# Patient Record
Sex: Female | Born: 1935
Health system: Southern US, Community
[De-identification: ages and names within clinical notes are randomized; demographics above are authoritative.]

## PROBLEM LIST (undated history)

## (undated) DIAGNOSIS — M810 Age-related osteoporosis without current pathological fracture: Secondary | ICD-10-CM

## (undated) DIAGNOSIS — C4490 Unspecified malignant neoplasm of skin, unspecified: Secondary | ICD-10-CM

## (undated) DIAGNOSIS — F419 Anxiety disorder, unspecified: Secondary | ICD-10-CM

## (undated) DIAGNOSIS — J189 Pneumonia, unspecified organism: Secondary | ICD-10-CM

## (undated) DIAGNOSIS — F17200 Nicotine dependence, unspecified, uncomplicated: Secondary | ICD-10-CM

## (undated) DIAGNOSIS — J449 Chronic obstructive pulmonary disease, unspecified: Secondary | ICD-10-CM

## (undated) DIAGNOSIS — H269 Unspecified cataract: Secondary | ICD-10-CM

## (undated) DIAGNOSIS — I639 Cerebral infarction, unspecified: Secondary | ICD-10-CM

## (undated) DIAGNOSIS — C50411 Malignant neoplasm of upper-outer quadrant of right female breast: Secondary | ICD-10-CM

## (undated) DIAGNOSIS — H3530 Unspecified macular degeneration: Secondary | ICD-10-CM

## (undated) DIAGNOSIS — H33329 Round hole, unspecified eye: Secondary | ICD-10-CM

## (undated) HISTORY — DX: Anxiety disorder, unspecified: F41.9

## (undated) HISTORY — PX: CATARACT EXTRACTION: SUR2

## (undated) HISTORY — DX: Age-related osteoporosis without current pathological fracture: M81.0

## (undated) HISTORY — DX: Round hole, unspecified eye: H33.329

## (undated) HISTORY — PX: MOHS SURGERY: SUR867

## (undated) HISTORY — PX: RETINAL LASER PROCEDURE: SHX2339

## (undated) HISTORY — DX: Cerebral infarction, unspecified: I63.9

## (undated) HISTORY — DX: Unspecified macular degeneration: H35.30

## (undated) HISTORY — DX: Chronic obstructive pulmonary disease, unspecified: J44.9

## (undated) HISTORY — DX: Unspecified cataract: H26.9

## (undated) HISTORY — PX: MASTECTOMY: SHX3

## (undated) HISTORY — DX: Nicotine dependence, unspecified, uncomplicated: F17.200

## (undated) HISTORY — DX: Malignant neoplasm of upper-outer quadrant of right female breast: C50.411

## (undated) HISTORY — DX: Unspecified malignant neoplasm of skin, unspecified: C44.90

## (undated) HISTORY — PX: BREAST BIOPSY: SHX20

## (undated) HISTORY — PX: APPENDECTOMY: SHX54

## (undated) HISTORY — PX: PARS PLANA VITRECTOMY W/ REPAIR OF MACULAR HOLE: SHX2170

---

## 1978-03-01 HISTORY — PX: ABDOMINAL HYSTERECTOMY: SHX81

## 1988-03-01 HISTORY — PX: BLADDER SURGERY: SHX569

## 1998-02-04 ENCOUNTER — Ambulatory Visit (HOSPITAL_COMMUNITY): Admission: RE | Admit: 1998-02-04 | Discharge: 1998-02-05 | Payer: Self-pay | Admitting: Ophthalmology

## 1998-02-04 ENCOUNTER — Encounter: Payer: Self-pay | Admitting: Ophthalmology

## 2011-02-15 ENCOUNTER — Ambulatory Visit (INDEPENDENT_AMBULATORY_CARE_PROVIDER_SITE_OTHER): Payer: Medicare Other

## 2011-02-15 DIAGNOSIS — Z23 Encounter for immunization: Secondary | ICD-10-CM

## 2011-02-15 DIAGNOSIS — Z130 Encounter for screening for diseases of the blood and blood-forming organs and certain disorders involving the immune mechanism: Secondary | ICD-10-CM

## 2013-03-01 DIAGNOSIS — C50919 Malignant neoplasm of unspecified site of unspecified female breast: Secondary | ICD-10-CM

## 2013-03-01 HISTORY — DX: Malignant neoplasm of unspecified site of unspecified female breast: C50.919

## 2013-03-01 HISTORY — PX: MASTECTOMY: SHX3

## 2013-07-13 ENCOUNTER — Encounter (INDEPENDENT_AMBULATORY_CARE_PROVIDER_SITE_OTHER): Payer: Medicare HMO | Admitting: Ophthalmology

## 2013-07-13 DIAGNOSIS — H3530 Unspecified macular degeneration: Secondary | ICD-10-CM

## 2013-07-13 DIAGNOSIS — H43819 Vitreous degeneration, unspecified eye: Secondary | ICD-10-CM

## 2013-07-13 DIAGNOSIS — H35369 Drusen (degenerative) of macula, unspecified eye: Secondary | ICD-10-CM

## 2013-10-04 ENCOUNTER — Telehealth: Payer: Self-pay | Admitting: *Deleted

## 2013-10-04 DIAGNOSIS — E538 Deficiency of other specified B group vitamins: Secondary | ICD-10-CM

## 2013-10-04 DIAGNOSIS — E559 Vitamin D deficiency, unspecified: Secondary | ICD-10-CM

## 2013-10-04 NOTE — Telephone Encounter (Signed)
Message copied by Julieta Bellini on Thu Oct 04, 2013  4:56 PM ------      Message from: WEAVER, LAYNE COX      Created: Thu Oct 04, 2013 11:43 AM       pls see msg below. Call son & gather info. about his concerns.  Thanks.      ----- Message -----         From: Nickola Major         Sent: 10/04/2013  11:13 AM           To: Irene Pap, NP            Patient's son would like to talk to you before her new patient appt tomorrow. His name is Daliana Leverett [phone]. He doesn't feel like she is going to tell you everything.       ------

## 2013-10-05 ENCOUNTER — Ambulatory Visit (INDEPENDENT_AMBULATORY_CARE_PROVIDER_SITE_OTHER)
Admission: RE | Admit: 2013-10-05 | Discharge: 2013-10-05 | Disposition: A | Discharge status: Home / Self Care | Payer: Medicare HMO | Source: Ambulatory Visit | Attending: Nurse Practitioner | Admitting: Nurse Practitioner

## 2013-10-05 ENCOUNTER — Ambulatory Visit: Payer: Self-pay | Admitting: Nurse Practitioner

## 2013-10-05 ENCOUNTER — Encounter: Payer: Self-pay | Admitting: Nurse Practitioner

## 2013-10-05 ENCOUNTER — Ambulatory Visit (INDEPENDENT_AMBULATORY_CARE_PROVIDER_SITE_OTHER): Payer: Medicare HMO | Admitting: Nurse Practitioner

## 2013-10-05 VITALS — BP 118/76 | HR 77 | Temp 98.0°F | Ht 64.5 in | Wt 109.8 lb

## 2013-10-05 DIAGNOSIS — R82998 Other abnormal findings in urine: Secondary | ICD-10-CM

## 2013-10-05 DIAGNOSIS — M7989 Other specified soft tissue disorders: Secondary | ICD-10-CM

## 2013-10-05 DIAGNOSIS — R42 Dizziness and giddiness: Secondary | ICD-10-CM

## 2013-10-05 DIAGNOSIS — F17200 Nicotine dependence, unspecified, uncomplicated: Secondary | ICD-10-CM | POA: Diagnosis not present

## 2013-10-05 DIAGNOSIS — F17210 Nicotine dependence, cigarettes, uncomplicated: Secondary | ICD-10-CM

## 2013-10-05 DIAGNOSIS — R634 Abnormal weight loss: Secondary | ICD-10-CM

## 2013-10-05 DIAGNOSIS — R8290 Unspecified abnormal findings in urine: Secondary | ICD-10-CM | POA: Insufficient documentation

## 2013-10-05 DIAGNOSIS — R6889 Other general symptoms and signs: Secondary | ICD-10-CM

## 2013-10-05 LAB — URINALYSIS, ROUTINE W REFLEX MICROSCOPIC
Bilirubin Urine: NEGATIVE
Hgb urine dipstick: NEGATIVE
Ketones, ur: NEGATIVE
Leukocytes, UA: NEGATIVE
Nitrite: NEGATIVE
Specific Gravity, Urine: 1.01 (ref 1.000–1.030)
Total Protein, Urine: NEGATIVE
Urine Glucose: NEGATIVE
Urobilinogen, UA: 0.2 (ref 0.0–1.0)
pH: 8.5 — AB (ref 5.0–8.0)

## 2013-10-05 LAB — CBC WITH DIFFERENTIAL/PLATELET
Basophils Absolute: 0.1 10*3/uL (ref 0.0–0.1)
Basophils Relative: 1.6 % (ref 0.0–3.0)
Eosinophils Absolute: 0.1 10*3/uL (ref 0.0–0.7)
Eosinophils Relative: 1.4 % (ref 0.0–5.0)
HEMATOCRIT: 44.5 % (ref 36.0–46.0)
HEMOGLOBIN: 14.7 g/dL (ref 12.0–15.0)
LYMPHS ABS: 1.7 10*3/uL (ref 0.7–4.0)
Lymphocytes Relative: 26.8 % (ref 12.0–46.0)
MCHC: 33.1 g/dL (ref 30.0–36.0)
MCV: 99 fl (ref 78.0–100.0)
MONO ABS: 0.6 10*3/uL (ref 0.1–1.0)
MONOS PCT: 8.8 % (ref 3.0–12.0)
NEUTROS ABS: 3.9 10*3/uL (ref 1.4–7.7)
Neutrophils Relative %: 61.4 % (ref 43.0–77.0)
PLATELETS: 206 10*3/uL (ref 150.0–400.0)
RBC: 4.49 Mil/uL (ref 3.87–5.11)
RDW: 13.2 % (ref 11.5–15.5)
WBC: 6.4 10*3/uL (ref 4.0–10.5)

## 2013-10-05 LAB — POCT URINALYSIS DIPSTICK
Bilirubin, UA: NEGATIVE
Blood, UA: NEGATIVE
GLUCOSE UA: NEGATIVE
Ketones, UA: NEGATIVE
Nitrite, UA: POSITIVE
SPEC GRAV UA: 1.015
Urobilinogen, UA: 1
pH, UA: 7.5

## 2013-10-05 LAB — COMPREHENSIVE METABOLIC PANEL
ALBUMIN: 3.4 g/dL — AB (ref 3.5–5.2)
ALT: 17 U/L (ref 0–35)
AST: 23 U/L (ref 0–37)
Alkaline Phosphatase: 66 U/L (ref 39–117)
BUN: 15 mg/dL (ref 6–23)
CHLORIDE: 100 meq/L (ref 96–112)
CO2: 27 meq/L (ref 19–32)
Calcium: 9.1 mg/dL (ref 8.4–10.5)
Creatinine, Ser: 0.9 mg/dL (ref 0.4–1.2)
GFR: 64.69 mL/min (ref >60.00)
GLUCOSE: 88 mg/dL (ref 70–99)
Potassium: 3.8 mEq/L (ref 3.5–5.1)
SODIUM: 138 meq/L (ref 135–145)
TOTAL PROTEIN: 6.1 g/dL (ref 6.0–8.3)
Total Bilirubin: 0.8 mg/dL (ref 0.2–1.2)

## 2013-10-05 LAB — T4, FREE: Free T4: 1.07 ng/dL (ref 0.60–1.60)

## 2013-10-05 LAB — VITAMIN D 25 HYDROXY (VIT D DEFICIENCY, FRACTURES): VITD: 18.54 ng/mL — AB (ref 30.00–100.00)

## 2013-10-05 LAB — TSH: TSH: 1.7 u[IU]/mL (ref 0.35–4.50)

## 2013-10-05 LAB — VITAMIN B12: Vitamin B-12: 215 pg/mL (ref 211–911)

## 2013-10-05 MED ORDER — CIPROFLOXACIN HCL 250 MG PO TABS
250.0000 mg | ORAL_TABLET | Freq: Two times a day (BID) | ORAL | Status: DC
Start: 2013-10-05 — End: 2013-11-07

## 2013-10-05 NOTE — Progress Notes (Signed)
Pre visit review using our clinic review tool, if applicable. No additional management support is needed unless otherwise documented below in the visit note. 

## 2013-10-05 NOTE — Telephone Encounter (Signed)
Patient's son Reynold Bowen called office requesting cb to discuss patient's ov for 10/05/2013. Returned [employer]. Reynold Bowen wanted Layne to be aware of some of the patient's symptoms. Reynold Bowen stated that patient has "slowed down" over the last few months. Patient has rapidly lost 50 lbs and has no energy. Patient has been making a lot of "little mistakes" with simple tasks. Patient has an appt Neuro 10/08/2013 for evaluation. Patient also has osteoporosis.

## 2013-10-05 NOTE — Patient Instructions (Addendum)
Please get chest xray. Please see cardiology regarding dizzy episodes and ankle swelling. Keep neurology appointment. Start drinking 1 can Ensure daily.  My office will call regarding lab work today.  I'd like to see you again in 4 to 6 weeks to review.  Nice to meet you!

## 2013-10-06 ENCOUNTER — Encounter: Payer: Self-pay | Admitting: Nurse Practitioner

## 2013-10-06 DIAGNOSIS — R6889 Other general symptoms and signs: Secondary | ICD-10-CM | POA: Insufficient documentation

## 2013-10-06 NOTE — Assessment & Plan Note (Signed)
+  1 pitting ankle edema, L>R w/swelling 1/3 way up shin on L. Dizzy episodes, not accompanied by nausea; palpitations; chest, neck, shoulder, or arm discomfort. Ref to cardio for eval. CXR, ua, CBC, CMET, thyroid studies

## 2013-10-06 NOTE — Progress Notes (Signed)
Subjective:     Gabrielle [name] is a 80 y.o. female and is here to establish care. The patient reports problems - forgetting how to get to familiar places & forgetting things she needs to do, weight loss 50 lbs. in 18 mos, no energy, dry skin, occasional diarrhea (1/mo.), occasional dizzyness w/position changes.b,. 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 In the last 6 mos she has begun to clean out & prepare her ex-husband's home for a sale due to his recent death. This has been somewhat overwhelming for her. She has lived alone for 21 years. Although she expresses concern over forgetfulness, she states she made appt. because her children asked her to do so. She thinks forgetfulness has been problem for a few mos.  She doesn't cook, eats out mostly, has 1 to 2 meals daily. Dizziness is not accompanied by other symptoms and has noticed this with weight loss.  She says she sleeps well and does not feel depressed.  History   Social History  . Marital Status: Divorced    Spouse Name: N/A    Number of Children: 2  . Years of Education: N/A   Occupational History  .      [employer]   Social History Main Topics  . Smoking status: Current Every Day Smoker -- 0.25 packs/day for 50 years  . Smokeless tobacco: Never Used  . Alcohol Use: 1.2 oz/week    2 Glasses of wine per week  . Drug Use: No  . Sexual Activity: No   Other Topics Concern  . Not on file   Social History Narrative   Gabrielle [name] lives alone. She continues to work PT for [employer]. She has a grown son-lives in [place] in [place].    Health Maintenance  Topic Date Due  . Tetanus/tdap  09/06/1951  . Colonoscopy  09/06/1982  . Zostavax  09/05/1992  . Influenza Vaccine  09/29/2013  . Pneumococcal Polysaccharide Vaccine Age 84 And Over  Completed    The following portions of the patient's history were reviewed and updated as appropriate: allergies, current medications, past family history, past medical  history, past social history, past surgical history and problem list.  Review of Systems Pertinent items are noted in HPI.   Objective:    BP 118/76  Pulse 77  Temp(Src) 98 F (36.7 C) (Temporal)  Ht 5' 4.5" (1.638 m)  Wt 109 lb 12 oz (49.782 kg)  BMI 18.55 kg/m2  SpO2 96% General appearance: alert, cooperative, appears stated age, no distress and responses negotiably delayed, answers open-ended questions, but is not conversive. Affect flat. Head: Normocephalic, without obvious abnormality, atraumatic Eyes: negative findings: lids and lashes normal and conjunctivae and sclerae normal Ears: effusion bilat TM, TM retraction, bones visible Throat: lips, mucosa, and tongue normal; teeth and gums normal and upper dentures in place Lungs: clear to auscultation bilaterally Heart: regular rate and rhythm, S1, S2 normal, no murmur, click, rub or gallop Abdomen: soft, non-tender; bowel sounds normal; no masses,  no organomegaly Extremities: edema +1 pitting ankle edema Pulses: 2+ and symmetric Lymph nodes: No cervical or [place] LAD    Assessment:   1. Loss of weight - CBC with Differential - Comprehensive metabolic panel - Urinalysis, Routine w reflex microscopic - Vit D  25 hydroxy (rtn osteoporosis monitoring) - Vitamin B12 - TSH - T4, free - Thyroid peroxidase antibody - DG Chest 2 View; Future  2. Dizzy - Ambulatory referral to Cardiology  3. Swelling of both lower extremities - Ambulatory referral to Cardiology - DG Chest 2 View; Future  4. Cigarette smoker - DG Chest 2 View; Future  5. Malodorous urine - Urine culture - ciprofloxacin (CIPRO) 250 MG tablet; Take 1 tablet (250 mg total) by mouth 2 (two) times daily.  Dispense: 6 tablet; Refill: 0 - POCT urinalysis dipstick  6. Forgetfulness May be r/t UTI

## 2013-10-06 NOTE — Assessment & Plan Note (Signed)
Still working for [employer]. Weight loss. Responses somewhat delayed, answers all questions, but not conversive. Children concerned, she is concerned. Mini mental assessment score 29/30. Has appt w/neurology for eval.

## 2013-10-06 NOTE — Assessment & Plan Note (Signed)
DD: thyroid disease, anemia, arrythmia, vertigo, CVD, orthostatic BP changes CMET, TSH, CBC, B12 Ref to cardiology for eval, given ankle edema, smoker, 80 yo.

## 2013-10-06 NOTE — Assessment & Plan Note (Signed)
UA-pos nites, leuks, trace protein, SG 1.015 Forgetting how to get to familiar places may be symptom, o/w asymptomatic. Culture, urine Cipro F/u 2-3 weeks.

## 2013-10-06 NOTE — Assessment & Plan Note (Signed)
DD: thyroid disease, cancer (smoker), dementia CBC, thryoid studies, CXRY, CMET Pt has appt. W/neurology this week to eval "forgetting how to drive to familiar places"

## 2013-10-07 LAB — URINE CULTURE: Colony Count: >=100000

## 2013-10-08 ENCOUNTER — Other Ambulatory Visit: Payer: Self-pay | Admitting: *Deleted

## 2013-10-08 ENCOUNTER — Ambulatory Visit (INDEPENDENT_AMBULATORY_CARE_PROVIDER_SITE_OTHER): Payer: Medicare HMO | Admitting: Neurology

## 2013-10-08 ENCOUNTER — Encounter: Payer: Self-pay | Admitting: Neurology

## 2013-10-08 VITALS — BP 122/64 | HR 60 | Resp 18 | Ht 64.0 in | Wt 110.0 lb

## 2013-10-08 DIAGNOSIS — E538 Deficiency of other specified B group vitamins: Secondary | ICD-10-CM

## 2013-10-08 DIAGNOSIS — E559 Vitamin D deficiency, unspecified: Secondary | ICD-10-CM

## 2013-10-08 DIAGNOSIS — R413 Other amnesia: Secondary | ICD-10-CM

## 2013-10-08 LAB — THYROID PEROXIDASE ANTIBODY: Thyroperoxidase Ab SerPl-aCnc: <10 IU/mL (ref <35.0)

## 2013-10-08 MED ORDER — CYANOCOBALAMIN 1000 MCG/ML IJ SOLN: 1000.0000 ug | INTRAMUSCULAR | Status: DC

## 2013-10-08 MED ORDER — CHOLECALCIFEROL 125 MCG (5000 UT) PO CAPS: 5000.0000 [IU] | ORAL_CAPSULE | Freq: Every day | ORAL | Status: DC

## 2013-10-08 NOTE — Telephone Encounter (Signed)
pls call pt: Advise b12 low. I recmd weekly B12 injections for 6 weeks. Also, she may start B complex supplement with at least 1200 mcg of B12. Vit D too low. Start D3 5000 iu daily. Urine culture shows bacterial growth. Must start on antibiotics today. Set up appt. For B12 inj for this week.

## 2013-10-08 NOTE — Telephone Encounter (Signed)
Patient notified of results. Patient expressed understanding. Patient scheduled nurse appt for 10/12/13 at 3:00.

## 2013-10-08 NOTE — Patient Instructions (Signed)
1.  If you do not hear anything from your primary care doctors office in the next week regarding B12 injections, please contact their office directly, or contact our office. 2.  Start taking vitamin D 800 units daily 3.  Please start taking the prescribed antibiotics for your urinary tract infection 4.  If not improvement, will plan on getting Neuropsychiatric testing 5.  Return to clinic in 6 weeks

## 2013-10-08 NOTE — Progress Notes (Signed)
[facility] Clinic Note - Initial Visit   Date: 10/08/2013  Gabrielle [name] MRN: [id number] DOB: 08-19-1930   Dear Nicky Pugh, NP:  Thank you for your kind referral of Gabrielle [name] for consultation of memory changes. Although her history is well known to you, please allow [place] to reiterate it for the purpose of our medical record. The patient was accompanied to the clinic by son who also provides collateral information.     History of Present Illness: Gabrielle [name] is a 80 y.o. right-handed Caucasian female with history of current tobacco use  presenting for evaluation of memory changes.    Since January 2015, her son noticed changes in her mother's cognitive ability.  She has been having problems with short-term memory and not has "sharp" as she used to be.  She is highly functioning at baseline and works part-time at [employer] and has not noticed any problems at work.  Problems are mostly with short-term memory such as remembering names, dates, appointments and she has started to write things down more.  Her son and daughter have noticed the changes more than patient.  Hobbies include reading, working, and lately she has been organizing her ex-husbands home since he passed away last year.  They had been divorced for 20+ years.  She is driving without any difficulty.  She denies getting lost or being involved in any car accidents.  She cooks for herself and eats 2-3 meals per day.  Her son says that her response is slower, such as when talking in conversation.  Her mood is good.  Sleep is fair, averaging 6-7 hours per night.  No bizarre or inappropriate behavior changes.    She has not seen a physician in 10-15 years and saw primary care physician on Friday.  She was found to have UTI and started on ciprofloxacin (not started taking it yet).    Of note, she has lost 50lb unintentionally in the past 20-months.  Appetite remains good.     Dementia  questions Memory Are you repeating things excessively?  Yes Are you forgetting important details of conversations/events?  Yes Are you prone to misplacing items more now than in the past?  Yes Can you tell me about some recent headlines?  "presidential debate"  Executive Are you having trouble managing financial matters?  No problems with finance Are you taking your medications regularly w/o prompting?  N/A Can you organize and prepare a large holiday meal for multiple people?  Yes Can you multitask effectively? Yes  Language Do you have any word finding difficulties?  No  Do you have trouble following instructions or a conversation?  Sometimes Have you been avoiding reading or writing due to problems with recognition or remembering words?  "I read a lot"  Are you using generalities when speaking because of memory trouble?  No  Visuospatial Are you getting lost while driving?  No Are you getting turned around in your own home?  No Do you have trouble recognizing familiar faces/family members/close friends?  No Do you have trouble dressing, putting on cloths?  No    Past Medical History  Diagnosis Date  . Cataract   . Retina hole     Past Surgical History  Procedure Laterality Date  . Abdominal hysterectomy  1980  . Cataract extraction Bilateral   . Retinal laser procedure       Medications:  Current Outpatient Prescriptions on File Prior to Visit  Medication Sig Dispense Refill  .  ciprofloxacin (CIPRO) 250 MG tablet Take 1 tablet (250 mg total) by mouth 2 (two) times daily.  6 tablet  0   No current facility-administered medications on file prior to visit.    Allergies: No Known Allergies  Family History: Family History  Problem Relation Age of Onset  . Heart attack Mother     Deceased  . Throat cancer Father     Deceased  . Cancer Brother     Deceased  . Other Brother     Deceased, hemorhhage of pancreas    Social History: History   Social History   . Marital Status: Divorced    Spouse Name: N/A    Number of Children: 2  . Years of Education: N/A   Occupational History  .      [employer]   Social History Main Topics  . Smoking status: Current Every Day Smoker -- 0.50 packs/day for 50 years    Types: Cigarettes  . Smokeless tobacco: Never Used  . Alcohol Use: 1.2 oz/week    2 Glasses of wine per week     Comment: wine a couple times weekly  . Drug Use: No  . Sexual Activity: No   Other Topics Concern  . Not on file   Social History Narrative   Ms. Mindel lives alone in a [place] home. She had two grown children.   She continues to work part-time for [employer].    She has a grown son-lives in [place] in [place]. [name] is her nephew.    Review of Systems:  CONSTITUTIONAL: No fevers, chills, night sweats, + 50lb unintentional weight loss.   EYES: No visual changes or eye pain ENT: No hearing changes.  No history of nose bleeds.   RESPIRATORY: No cough, wheezing and shortness of breath.   CARDIOVASCULAR: Negative for chest pain, and palpitations.   GI: Negative for abdominal discomfort, blood in stools or black stools.  No recent change in bowel habits.   GU:  No history of incontinence.   MUSCLOSKELETAL: No history of joint pain or swelling.  No myalgias.   SKIN: Negative for lesions, rash, and itching.   HEMATOLOGY/ONCOLOGY: Negative for prolonged bleeding, bruising easily, and swollen nodes.  No history of cancer.   ENDOCRINE: Negative for cold or heat intolerance, polydipsia or goiter.   PSYCH:  No depression or anxiety symptoms.   NEURO: As Above.   Vital Signs:  BP 122/64  Pulse 60  Resp 18  Ht 5\' 4"  (1.626 m)  Wt 110 lb (49.896 kg)  BMI 18.87 kg/m2 Pain Scale: 0 on a scale of 0-10   General Medical Exam:   General:  Flat affect, reduced blink, comfortable, thin-appearing.   Eyes/ENT: see cranial nerve examination.   Neck: No carotid bruits. Respiratory:  Clear to auscultation,  good air entry bilaterally.   Cardiac:  Regular rate and rhythm, no murmur.    Extremities:  Arthritic deformities of the toes bilaterally, mild edema of the left ankle.   Neurological Exam: MENTAL STATUS including orientation to time, place, person, recent and remote memory, attention span and concentration, language, and fund of knowledge is fair.  Speech is not dysarthric. Thought content and processing is normal.   Montreal Cognitive Assessment  10/08/2013  Visuospatial/ Executive (0/5) 2  Naming (0/3) 2  Attention: Read list of digits (0/2) 2  Attention: Read list of letters (0/1) 1  Attention: Serial 7 subtraction starting at 100 (0/3) 1  Language: Repeat  phrase (0/2) 2  Language : Fluency (0/1) 0  Abstraction (0/2) 2  Delayed Recall (0/5) 3  Orientation (0/6) 5  Total 20  Adjusted Score (based on education) 20   Serial presidents:  Obama, Bush, Clinton 9/11:  [place], 4 planes Chest pain:  Call 9-1-1 Luria test: Impaired bilaterally  CRANIAL NERVES: II:  No visual field defects.  Unremarkable fundi.   III-IV-VI: Pupils equal round and reactive to light.  Upward gaze mild to restricted bilaterally, otherwise normal conjugate, extra-ocular eye movements in all directions of gaze.  No nystagmus.    V:  Normal facial sensation.   VII:  Normal facial symmetry and movements.  Left palmomental reflex, mild snout.  VIII:  Normal hearing and vestibular function.   IX-X:  Normal palatal movement.   XI:  Normal shoulder shrug and head rotation.   XII:  Normal tongue strength and range of motion, no deviation or fasciculation.  MOTOR:  Generalized 4+/5 motor strength throughout.  No atrophy, fasciculations or abnormal movements.  No pronator drift.  Tone is normal.    MSRs:  Right                                                                 Left brachioradialis 2+  brachioradialis 2+  biceps 2+  biceps 2+  triceps 2+  triceps 2+  patellar 2+  patellar 2+  ankle  jerk 0  ankle jerk 0  Hoffman no  Hoffman no  plantar response down  plantar response down   SENSORY:  Normal and symmetric perception of light touch, pinprick, vibration, and proprioception.  Romberg's sign absent.   COORDINATION/GAIT: Normal finger-to- nose-finger and heel-to-shin.  Intact rapid alternating movements bilaterally. Gait is slightly slow and cautious, stooped posture, appears stable.  Tandem gait intact.   Data: Lab Results  Component Value Date   TSH 1.70 10/05/2013   Lab Results  Component Value Date   VITAMINB12 215 10/05/2013     IMPRESSION: Ms. Kronick is a 80 year old female presenting for evaluation of short term memory problems since early 2015. She seems to have always been very highly functional and in good health, so has not been under the care of a medical provider for the past 10-15 years. Do to concerns by her daughter and her son regarding her memory, she was urged to be evaluated. Her neurological examination shows MOCA of 20/30 with moderate cognitive deficits affecting all of the tested domains.  Her clinical exam shows mild pathological facial reflexes and with slight generalized weakness throughout. I explained that there is evidence of cognitive impairment and it is difficult to determine underlying etiology, since there are several factors that may be contributing.  She was recently found to have urinary tract infection and has not started antibiotics, her labs also indicated vitamin B12 deficiency, and vitamin D deficiency. Although there may be an underlying dementia, it is important to manage potentially treatable causes of mental status changes including nutrient deficiencies and infection.  At this time, I have urged them to start taking antibiotics for urinary tract infection and I also recommend aggressive vitamin B12 supplementation. I'm not sure why she has lost 50 pounds in the past year, but encouraged her to follow up with her primary care  provider for  workup.  She does does not seem to have any problems with driving at this time, which her son agrees with. I have discussed that going forward, if there are any safety concerns, we would need to discuss driving restrictions in the future.    PLAN/RECOMMENDATIONS:  1. Recommend starting vitamin B12 1051mcg IM injection daily x 7 days, weekly x 4 weeks, then monthly thereafter x 1 year.  Will request that her PCP's office administer, since it is closer for her. 2.  Start taking vitamin D 800 units daily 3.  Please start taking the prescribed antibiotics for your urinary tract infection 4.  If not improvement, will plan on getting Neuropsychiatric testing 5.  Return to clinic in 6 weeks   The duration of this appointment visit was 60 minutes of face-to-face time with the patient.  Greater than 50% of this time was spent in counseling, explanation of diagnosis, planning of further management, and coordination of care.   Thank you for allowing me to participate in patient's care.  If I can answer any additional questions, I would be pleased to do so.    Sincerely,    Drayson Dorko K. Posey Pronto, DO

## 2013-10-12 ENCOUNTER — Ambulatory Visit (INDEPENDENT_AMBULATORY_CARE_PROVIDER_SITE_OTHER): Payer: Medicare HMO | Admitting: *Deleted

## 2013-10-12 DIAGNOSIS — E538 Deficiency of other specified B group vitamins: Secondary | ICD-10-CM

## 2013-10-12 MED ORDER — CYANOCOBALAMIN 1000 MCG/ML IJ SOLN
1000.0000 ug | Freq: Once | INTRAMUSCULAR | Status: AC
  Administered 2013-10-12: 1000 ug via INTRAMUSCULAR

## 2013-10-18 ENCOUNTER — Ambulatory Visit: Payer: Medicare HMO

## 2013-10-18 DIAGNOSIS — E538 Deficiency of other specified B group vitamins: Secondary | ICD-10-CM

## 2013-10-18 MED ORDER — CYANOCOBALAMIN 1000 MCG/ML IJ SOLN
1000.0000 ug | Freq: Once | INTRAMUSCULAR | Status: AC
  Administered 2013-10-18: 1000 ug via INTRAMUSCULAR

## 2013-10-25 ENCOUNTER — Ambulatory Visit (INDEPENDENT_AMBULATORY_CARE_PROVIDER_SITE_OTHER): Payer: Medicare HMO | Admitting: Family Medicine

## 2013-10-25 DIAGNOSIS — E538 Deficiency of other specified B group vitamins: Secondary | ICD-10-CM

## 2013-10-25 MED ORDER — CYANOCOBALAMIN 1000 MCG/ML IJ SOLN
1000.0000 ug | Freq: Once | INTRAMUSCULAR | Status: AC
  Administered 2013-10-25: 1000 ug via INTRAMUSCULAR

## 2013-10-30 DIAGNOSIS — C50411 Malignant neoplasm of upper-outer quadrant of right female breast: Secondary | ICD-10-CM

## 2013-10-30 HISTORY — DX: Malignant neoplasm of upper-outer quadrant of right female breast: C50.411

## 2013-11-01 ENCOUNTER — Ambulatory Visit (INDEPENDENT_AMBULATORY_CARE_PROVIDER_SITE_OTHER): Payer: Commercial Managed Care - HMO | Admitting: Family Medicine

## 2013-11-01 DIAGNOSIS — E538 Deficiency of other specified B group vitamins: Secondary | ICD-10-CM

## 2013-11-01 MED ORDER — CYANOCOBALAMIN 1000 MCG/ML IJ SOLN
1000.0000 ug | Freq: Once | INTRAMUSCULAR | Status: AC
  Administered 2013-11-01: 1000 ug via INTRAMUSCULAR

## 2013-11-06 ENCOUNTER — Encounter: Payer: Self-pay | Admitting: Nurse Practitioner

## 2013-11-06 ENCOUNTER — Ambulatory Visit (INDEPENDENT_AMBULATORY_CARE_PROVIDER_SITE_OTHER): Payer: Commercial Managed Care - HMO | Admitting: Nurse Practitioner

## 2013-11-06 VITALS — BP 116/72 | HR 75 | Temp 98.0°F | Resp 18 | Ht 64.0 in | Wt 112.0 lb

## 2013-11-06 DIAGNOSIS — R42 Dizziness and giddiness: Secondary | ICD-10-CM

## 2013-11-06 DIAGNOSIS — M7989 Other specified soft tissue disorders: Secondary | ICD-10-CM

## 2013-11-06 DIAGNOSIS — Z87440 Personal history of urinary (tract) infections: Secondary | ICD-10-CM

## 2013-11-06 DIAGNOSIS — R634 Abnormal weight loss: Secondary | ICD-10-CM

## 2013-11-06 DIAGNOSIS — E538 Deficiency of other specified B group vitamins: Secondary | ICD-10-CM

## 2013-11-06 DIAGNOSIS — J439 Emphysema, unspecified: Secondary | ICD-10-CM

## 2013-11-06 DIAGNOSIS — E559 Vitamin D deficiency, unspecified: Secondary | ICD-10-CM

## 2013-11-06 DIAGNOSIS — R6889 Other general symptoms and signs: Secondary | ICD-10-CM

## 2013-11-06 DIAGNOSIS — J438 Other emphysema: Secondary | ICD-10-CM

## 2013-11-06 MED ORDER — CYANOCOBALAMIN 1000 MCG/ML IJ SOLN
1000.0000 ug | Freq: Once | INTRAMUSCULAR | Status: AC
  Administered 2013-11-06: 1000 ug via INTRAMUSCULAR

## 2013-11-06 NOTE — Progress Notes (Signed)
Pre visit review using our clinic review tool, if applicable. No additional management support is needed unless otherwise documented below in the visit note. 

## 2013-11-06 NOTE — Patient Instructions (Signed)
Continue with Vitamin d3 5000 iu daily with meal. Continue with ensure daily-1 to 2 cans. Eat fresh fruit everyday to nourish your body.  OK to take 600 mg calcium daily. It is best absorbed at night, so take it at or near bedtime.  Please get CT scans.  Please keep appointment with cardiology tomorrow.  Call Dr Serita Grit office regarding follow up-looks like she wanted to see you in 6 weeks.  Return for B12 next week & to discuss CT results.  Nice to see you!

## 2013-11-07 ENCOUNTER — Ambulatory Visit (INDEPENDENT_AMBULATORY_CARE_PROVIDER_SITE_OTHER): Payer: Commercial Managed Care - HMO | Admitting: Cardiovascular Disease

## 2013-11-07 ENCOUNTER — Encounter: Payer: Self-pay | Admitting: Cardiovascular Disease

## 2013-11-07 VITALS — BP 110/60 | HR 84 | Ht 64.0 in | Wt 114.2 lb

## 2013-11-07 DIAGNOSIS — J439 Emphysema, unspecified: Secondary | ICD-10-CM | POA: Insufficient documentation

## 2013-11-07 DIAGNOSIS — J449 Chronic obstructive pulmonary disease, unspecified: Secondary | ICD-10-CM

## 2013-11-07 DIAGNOSIS — R609 Edema, unspecified: Secondary | ICD-10-CM

## 2013-11-07 DIAGNOSIS — R0609 Other forms of dyspnea: Secondary | ICD-10-CM

## 2013-11-07 DIAGNOSIS — R600 Localized edema: Secondary | ICD-10-CM

## 2013-11-07 DIAGNOSIS — R0989 Other specified symptoms and signs involving the circulatory and respiratory systems: Secondary | ICD-10-CM

## 2013-11-07 DIAGNOSIS — R0600 Dyspnea, unspecified: Secondary | ICD-10-CM

## 2013-11-07 DIAGNOSIS — M7989 Other specified soft tissue disorders: Secondary | ICD-10-CM

## 2013-11-07 NOTE — Assessment & Plan Note (Signed)
No improvement in fatigue after 4 weekly inj. 2 more weekly inj, then I will check level.

## 2013-11-07 NOTE — Assessment & Plan Note (Signed)
Gabrielle [name] presents today for further evaluation and management of her leg edema as well as some dizziness. The leg edema is very mild and appears to be more related to her COPD. She denies any PND orthopnea. Chest x-ray does not show any pulmonary edema which does not suggest any LV dysfunction. She does have significant COPD I suspect that she has right-sided heart failure based on her potassium. I think it is important for her to  start  treatment for her COPD.  She also has some dizziness but this is actually very benign/  We will get an echo and see her as needed.    I think that her 45-50 lb weight loss is actually more critical than this trace leg edema.   She has orders for CT of the lung and abdomen in Epic.

## 2013-11-07 NOTE — Progress Notes (Signed)
Subjective:     Gabrielle [name] is a 80 y.o. female who presents for f/u weight loss, fatigue, UTI, B12 & vit D deficiency. She is accompanied by her daughter today who voices reasonable concern over weight loss & wonders if her mother has cancer. She requests CT scans of chest, abdomen, & pelvis. Pt is in agreement.  Gabrielle [name] states she has had no improvement in energy in spite of B12 injections & drinking ensure daily, although she had 3 lb. Wt gain in 4 weeks since starting Ensure. She answers all questions appropriately, although it takes a few brief seconds to get thoughts together. She isn't sure if she has clearer cognition since UTI was treated.  Gabrielle [name] continues to smoke about 6 cigarettes daily. She is not interested in quitting. Her daughter is concerned about her cough-recent chest XRY showed hyperinflation consistent with emphysema-this was discussed with pt & daughter. Pt will consider seeing pulmonologist, but wants to get CT scan results first.   The following portions of the patient's history were reviewed and updated as appropriate: allergies, current medications, past family history, past medical history, past social history, past surgical history and problem list.  Review of Systems Pertinent items are noted in HPI.    Objective:    BP 116/72  Pulse 75  Temp(Src) 98 F (36.7 C) (Oral)  Resp 18  Ht 5\' 4"  (1.626 m)  Wt 112 lb (50.803 kg)  BMI 19.22 kg/m2  SpO2 98% BP 116/72  Pulse 75  Temp(Src) 98 F (36.7 C) (Oral)  Resp 18  Ht 5\' 4"  (1.626 m)  Wt 112 lb (50.803 kg)  BMI 19.22 kg/m2  SpO2 98% General appearance: alert, cooperative, appears stated age and no distress Head: Normocephalic, without obvious abnormality, atraumatic Eyes: negative findings: lids and lashes normal and conjunctivae and sclerae normal Lungs: diminished breath sounds bibasilar and bilaterally Heart: regular rate and rhythm, S1, S2 normal, no murmur, click, rub or gallop Extremities: +1  pitting edema from feet to 3 inches above ankles bilat Pulses: 2+ and symmetric    Assessment:   1. Loss of weight - CT Abdomen Pelvis Wo Contrast; Future - CT Chest Wo Contrast; Future  2. Recent urinary tract infection - Urine culture  3. Dizzy  4. Forgetfulness  5. Swelling of both lower extremities  6. Unspecified vitamin D deficiency  7. Vitamin B12 deficiency without anemia - cyanocobalamin ((VITAMIN B-12)) injection 1,000 mcg; Inject 1 mL (1,000 mcg total) into the muscle once.  8. Pulmonary emphysema  See problem list for complete A&P See pt instructions. F/u 2 weeks.

## 2013-11-07 NOTE — Assessment & Plan Note (Signed)
3 lb. Weight gain in 4 weeks-started drinking Ensure daily. Thyroid nml No anemia Family requests CT scans of abdomen, pelvis, chest. Pt agrees. Ordered.  F/u 2 weeks to discuss results.

## 2013-11-07 NOTE — Assessment & Plan Note (Signed)
Mild improvement after drinking ensure for daily for 4 weeks (albumin was low), but persistent. Feels tired in spite of UTI treatemnt & B12 injections. Likely cardiac etiology. Discussed w/ pt & dtr. She has cardio. appt this week.

## 2013-11-07 NOTE — Patient Instructions (Addendum)
You have been referred to [facility] has requested that you have an echocardiogram. Echocardiography is a painless test that uses sound waves to create images of your heart. It provides your doctor with information about the size and shape of your heart and how well your heart's chambers and valves are working. This procedure takes approximately one hour. There are no restrictions for this procedure.  Your physician recommends that you schedule a follow-up appointment in: as needed with Dr. Acie Fredrickson

## 2013-11-07 NOTE — Assessment & Plan Note (Signed)
Hyperinflation per CXY 09/2013 Smoker: 1/4 ppd X 50 yrs. chronic cough Not interested in quitting Wt loss, but does not have appearance of "pink puffer" CT scan chest ordered.

## 2013-11-07 NOTE — Assessment & Plan Note (Signed)
Continue 5000 iu daily Will recheck level in 2 mos. No improvement in fatigue after 4 weeks, but daughter states she was taking 1000 rather than 5000 daily.

## 2013-11-07 NOTE — Progress Notes (Signed)
Gabrielle [name] Date of Birth  01-05-1931       [place] [address], [place], [place]  [address]   [place], [place]  [address] [place]   Fax  [phone]     Fax [phone]  Problem List: 1. Dizziness 2. COPD 3. Leg edema 4. Weight loss  History of Present Illness:  Gabrielle [name] is seen today with her daughter, Gabrielle [name].   She has had lost lots of weight. Between 45-50 pounds over the past several years.  She's had lots of generalized fatigue.   her appetite has been okay according to the patient that her daughter thinks that she is not eating well.   She is a long time smoker. She was seen by a primary MD / NP .Marland Kitchen  She's had chronic leg edema for the several months.  She still eats a fair amount of salty cheese bacon, bloating, sausage, ham.    CXR recently shows COPD.   Current Outpatient Prescriptions on File Prior to Visit  Medication Sig Dispense Refill  . Cholecalciferol ([employer] D3) 5000 UNITS capsule Take 1 capsule (5,000 Units total) by mouth daily.  30 capsule  2  . cyanocobalamin (,VITAMIN B-12,) 1000 MCG/ML injection Inject 1 mL (1,000 mcg total) into the muscle every 7 (seven) days. 4 injections q7 X 6wks.  1 mL  0   No current facility-administered medications on file prior to visit.    No Known Allergies  Past Medical History  Diagnosis Date  . Cataract   . Retina hole     Past Surgical History  Procedure Laterality Date  . Abdominal hysterectomy  1980  . Cataract extraction Bilateral   . Retinal laser procedure      History  Smoking status  . Current Every Day Smoker -- 0.50 packs/day for 50 years  . Types: Cigarettes  Smokeless tobacco  . Never Used    History  Alcohol Use  . 1.2 oz/week  . 2 Glasses of wine per week    Comment: wine a couple times weekly    Family History  Problem Relation Age of Onset  . Heart attack Mother     Deceased  .  Throat cancer Father     Deceased  . Cancer Brother     Deceased  . Other Brother     Deceased, hemorhhage of pancreas    Reviw of Systems:  Reviewed in the HPI.  All other systems are negative.  Physical Exam: Blood pressure 110/60, pulse 84, height 5\' 4"  (1.626 m), weight 114 lb 3.2 oz (51.801 kg). Wt Readings from Last 3 Encounters:  11/07/13 114 lb 3.2 oz (51.801 kg)  11/06/13 112 lb (50.803 kg)  10/08/13 110 lb (49.896 kg)     General: Chronically ill-appearing, elderly female  Head: Normocephalic, atraumatic, sclera non-icteric, mucus membranes are moist,   Neck: Supple. Carotids are 2 + without bruits. No JVD   Lungs: decresed breath sounds  Heart: RR  Abdomen: Soft, non-tender, non-distended with normal bowel sounds.  Msk:  Strength and tone are normal   Extremities: Chronic stasis changes. She has trace edema.    Distal pedal pulses are 2+ and equal    Neuro: CN II - XII intact.  Alert and oriented X 3.   Psych:  Normal   ECG: 11/07/2013: Sinus rhythm with occasional PACs. Her heart rate is  84.  Assessment / Plan:

## 2013-11-08 LAB — URINE CULTURE: Colony Count: 15000

## 2013-11-14 ENCOUNTER — Inpatient Hospital Stay: Admission: RE | Admit: 2013-11-14 | Payer: Commercial Managed Care - HMO | Source: Ambulatory Visit

## 2013-11-15 ENCOUNTER — Ambulatory Visit (INDEPENDENT_AMBULATORY_CARE_PROVIDER_SITE_OTHER): Payer: Commercial Managed Care - HMO | Admitting: Family Medicine

## 2013-11-15 ENCOUNTER — Other Ambulatory Visit: Payer: Self-pay | Admitting: Nurse Practitioner

## 2013-11-15 DIAGNOSIS — E538 Deficiency of other specified B group vitamins: Secondary | ICD-10-CM

## 2013-11-15 DIAGNOSIS — R634 Abnormal weight loss: Secondary | ICD-10-CM

## 2013-11-15 MED ORDER — CYANOCOBALAMIN 1000 MCG/ML IJ SOLN
1000.0000 ug | Freq: Once | INTRAMUSCULAR | Status: AC
  Administered 2013-11-15: 1000 ug via INTRAMUSCULAR

## 2013-11-19 ENCOUNTER — Ambulatory Visit
Admission: RE | Admit: 2013-11-19 | Discharge: 2013-11-19 | Disposition: A | Discharge status: Home / Self Care | Payer: Commercial Managed Care - HMO | Source: Ambulatory Visit | Attending: Nurse Practitioner | Admitting: Nurse Practitioner

## 2013-11-19 ENCOUNTER — Ambulatory Visit
Admission: RE | Admit: 2013-11-19 | Discharge: 2013-11-19 | Disposition: A | Discharge status: Home / Self Care | Payer: Medicare HMO | Source: Ambulatory Visit | Attending: Nurse Practitioner | Admitting: Nurse Practitioner

## 2013-11-19 DIAGNOSIS — R634 Abnormal weight loss: Secondary | ICD-10-CM

## 2013-11-19 MED ORDER — IOHEXOL 300 MG/ML  SOLN
100.0000 mL | Freq: Once | INTRAMUSCULAR | Status: AC | PRN
Start: 2013-11-19 — End: 2013-11-19
  Administered 2013-11-19: 100 mL via INTRAVENOUS

## 2013-11-20 ENCOUNTER — Telehealth: Payer: Self-pay | Admitting: Nurse Practitioner

## 2013-11-20 NOTE — Telephone Encounter (Signed)
R breast mass, R lung nodule. Stool impaction. Spoke w/pt-asked her to come in to discuss CT results.  Pt has appt tomorrow.

## 2013-11-21 ENCOUNTER — Encounter: Payer: Self-pay | Admitting: Nurse Practitioner

## 2013-11-21 ENCOUNTER — Ambulatory Visit (INDEPENDENT_AMBULATORY_CARE_PROVIDER_SITE_OTHER): Payer: Commercial Managed Care - HMO | Admitting: Nurse Practitioner

## 2013-11-21 ENCOUNTER — Other Ambulatory Visit: Payer: Self-pay | Admitting: Nurse Practitioner

## 2013-11-21 VITALS — BP 114/67 | HR 82 | Temp 98.2°F | Resp 18 | Ht 64.0 in | Wt 112.0 lb

## 2013-11-21 DIAGNOSIS — R911 Solitary pulmonary nodule: Secondary | ICD-10-CM

## 2013-11-21 DIAGNOSIS — N6310 Unspecified lump in the right breast, unspecified quadrant: Secondary | ICD-10-CM

## 2013-11-21 DIAGNOSIS — Z23 Encounter for immunization: Secondary | ICD-10-CM

## 2013-11-21 DIAGNOSIS — N63 Unspecified lump in unspecified breast: Secondary | ICD-10-CM

## 2013-11-21 HISTORY — DX: Solitary pulmonary nodule: R91.1

## 2013-11-21 NOTE — Progress Notes (Signed)
Subjective:     Gabrielle [name] is a 80 y.o. female presents to discuss CT results of chest, abdomen & pelvis as part of work-up for 50 lb. weight loss in 18 mos. Chest CT shows R breAST mass & R lung nodule. I discussed the implication of cancer, but further imaging & likely biopsies would be needed to make diagnosis. Pt wishes to follow up with screening mammogram. I offered to call her family, she declines. She has an appointment tomorrow with Dr Noberto Retort by cardiology for SOB. & Hx smoker. Chest CT findings are new since pulmonology referral was made. I discussed stool in colon, will treat with enema.All questions answered. Pt received test results with some surprise, but calm & frank as she mentions her age. She has been getting B12 injections & taking vit D supplements for vitamin deficiencies. I will get vaccines UTD today-needs pneumococcal 13 & flu. She had zoster vaccine several years ago. Weight is stable.    The following portions of the patient's history were reviewed and updated as appropriate: allergies, current medications, past medical history, past social history, past surgical history and problem list.  Review of Systems Pertinent items are noted in HPI.    Objective:    BP 114/67  Pulse 82  Temp(Src) 98.2 F (36.8 C) (Oral)  Resp 18  Ht 5\' 4"  (1.626 m)  Wt 112 lb (50.803 kg)  BMI 19.22 kg/m2  SpO2 96% BP 114/67  Pulse 82  Temp(Src) 98.2 F (36.8 C) (Oral)  Resp 18  Ht 5\' 4"  (1.626 m)  Wt 112 lb (50.803 kg)  BMI 19.22 kg/m2  SpO2 96% General appearance: alert, cooperative, appears stated age and no distress Head: Normocephalic, without obvious abnormality, atraumatic Eyes: negative findings: lids and lashes normal and conjunctivae and sclerae normal    Assessment:   1. Breast mass, right - MM Digital Diagnostic Bilat; Future  2. Lung nodule, solitary  3. Need for prophylactic vaccination against Streptococcus pneumoniae (pneumococcus) -  Pneumococcal conjugate vaccine 13-valent  4. Need for prophylactic vaccination and inoculation against influenza  See pt instructions for f/u.

## 2013-11-21 NOTE — Assessment & Plan Note (Signed)
Diagnostic Mmg pending.  Discussed with patient.

## 2013-11-21 NOTE — Patient Instructions (Signed)
Continue Vitamin D daily 5000 iu with meal.  Start B Complex liquid to get 1200 mcg in each dropperful. Take 2 droppersful daily.  Please get diagnostic mammogram.  Please return in 2 weeks to have Vit B12 checked.  Nice to see you!

## 2013-11-21 NOTE — Progress Notes (Signed)
Pre visit review using our clinic review tool, if applicable. No additional management support is needed unless otherwise documented below in the visit note. 

## 2013-11-22 ENCOUNTER — Ambulatory Visit (HOSPITAL_COMMUNITY): Payer: Medicare HMO | Attending: Nurse Practitioner | Admitting: Radiology

## 2013-11-22 ENCOUNTER — Institutional Professional Consult (permissible substitution): Payer: Medicare HMO | Admitting: Internal Medicine

## 2013-11-22 DIAGNOSIS — J449 Chronic obstructive pulmonary disease, unspecified: Secondary | ICD-10-CM | POA: Diagnosis not present

## 2013-11-22 DIAGNOSIS — F17200 Nicotine dependence, unspecified, uncomplicated: Secondary | ICD-10-CM | POA: Diagnosis not present

## 2013-11-22 DIAGNOSIS — R0600 Dyspnea, unspecified: Secondary | ICD-10-CM

## 2013-11-22 DIAGNOSIS — J4489 Other specified chronic obstructive pulmonary disease: Secondary | ICD-10-CM | POA: Insufficient documentation

## 2013-11-22 DIAGNOSIS — R609 Edema, unspecified: Secondary | ICD-10-CM | POA: Insufficient documentation

## 2013-11-22 DIAGNOSIS — R600 Localized edema: Secondary | ICD-10-CM

## 2013-11-22 DIAGNOSIS — R0602 Shortness of breath: Secondary | ICD-10-CM | POA: Insufficient documentation

## 2013-11-22 NOTE — Progress Notes (Signed)
Echocardiogram performed.  

## 2013-11-26 ENCOUNTER — Ambulatory Visit
Admission: RE | Admit: 2013-11-26 | Discharge: 2013-11-26 | Disposition: A | Discharge status: Home / Self Care | Payer: Commercial Managed Care - HMO | Source: Ambulatory Visit | Attending: Nurse Practitioner | Admitting: Nurse Practitioner

## 2013-11-26 ENCOUNTER — Other Ambulatory Visit: Payer: Self-pay | Admitting: Nurse Practitioner

## 2013-11-26 ENCOUNTER — Ambulatory Visit
Admission: RE | Admit: 2013-11-26 | Discharge: 2013-11-26 | Disposition: A | Discharge status: Home / Self Care | Payer: Medicare HMO | Source: Ambulatory Visit | Attending: Nurse Practitioner | Admitting: Nurse Practitioner

## 2013-11-26 DIAGNOSIS — N6310 Unspecified lump in the right breast, unspecified quadrant: Secondary | ICD-10-CM

## 2013-11-27 ENCOUNTER — Encounter: Payer: Self-pay | Admitting: Nurse Practitioner

## 2013-11-27 ENCOUNTER — Other Ambulatory Visit: Payer: Self-pay | Admitting: Nurse Practitioner

## 2013-11-27 DIAGNOSIS — C50911 Malignant neoplasm of unspecified site of right female breast: Secondary | ICD-10-CM

## 2013-11-29 ENCOUNTER — Telehealth: Payer: Self-pay | Admitting: *Deleted

## 2013-11-29 DIAGNOSIS — Z170 Estrogen receptor positive status [ER+]: Secondary | ICD-10-CM

## 2013-11-29 DIAGNOSIS — C50411 Malignant neoplasm of upper-outer quadrant of right female breast: Secondary | ICD-10-CM | POA: Insufficient documentation

## 2013-11-29 NOTE — Telephone Encounter (Signed)
Confirmed [facility] for 12/05/13 at 1200.  Instructions and contact information given.

## 2013-11-30 ENCOUNTER — Other Ambulatory Visit: Payer: Self-pay | Admitting: Nurse Practitioner

## 2013-11-30 ENCOUNTER — Other Ambulatory Visit: Payer: Self-pay

## 2013-11-30 ENCOUNTER — Ambulatory Visit
Admission: RE | Admit: 2013-11-30 | Discharge: 2013-11-30 | Disposition: A | Discharge status: Home / Self Care | Payer: Medicare HMO | Source: Ambulatory Visit | Attending: Nurse Practitioner | Admitting: Nurse Practitioner

## 2013-11-30 DIAGNOSIS — C50911 Malignant neoplasm of unspecified site of right female breast: Secondary | ICD-10-CM

## 2013-11-30 MED ORDER — GADOBENATE DIMEGLUMINE 529 MG/ML IV SOLN
10.0000 mL | Freq: Once | INTRAVENOUS | Status: AC | PRN
  Administered 2013-11-30: 10 mL via INTRAVENOUS

## 2013-12-04 ENCOUNTER — Inpatient Hospital Stay: Admission: RE | Admit: 2013-12-04 | Payer: Commercial Managed Care - HMO | Source: Ambulatory Visit

## 2013-12-05 ENCOUNTER — Other Ambulatory Visit (HOSPITAL_BASED_OUTPATIENT_CLINIC_OR_DEPARTMENT_OTHER): Payer: Commercial Managed Care - HMO

## 2013-12-05 ENCOUNTER — Encounter: Payer: Self-pay | Admitting: Oncology

## 2013-12-05 ENCOUNTER — Ambulatory Visit
Admission: RE | Admit: 2013-12-05 | Discharge: 2013-12-05 | Disposition: A | Discharge status: Home / Self Care | Payer: Medicare HMO | Source: Ambulatory Visit | Attending: Radiation Oncology | Admitting: Radiation Oncology

## 2013-12-05 ENCOUNTER — Ambulatory Visit (HOSPITAL_BASED_OUTPATIENT_CLINIC_OR_DEPARTMENT_OTHER): Payer: Commercial Managed Care - HMO | Admitting: Oncology

## 2013-12-05 ENCOUNTER — Ambulatory Visit (HOSPITAL_BASED_OUTPATIENT_CLINIC_OR_DEPARTMENT_OTHER): Payer: Commercial Managed Care - HMO

## 2013-12-05 ENCOUNTER — Other Ambulatory Visit: Payer: Commercial Managed Care - HMO

## 2013-12-05 ENCOUNTER — Other Ambulatory Visit (INDEPENDENT_AMBULATORY_CARE_PROVIDER_SITE_OTHER): Payer: Self-pay | Admitting: General Surgery

## 2013-12-05 ENCOUNTER — Other Ambulatory Visit: Payer: Self-pay

## 2013-12-05 ENCOUNTER — Ambulatory Visit: Payer: Medicare HMO | Admitting: Physical Therapy

## 2013-12-05 VITALS — BP 140/60 | HR 73 | Temp 98.0°F | Resp 18 | Ht 64.0 in | Wt 111.6 lb

## 2013-12-05 DIAGNOSIS — J449 Chronic obstructive pulmonary disease, unspecified: Secondary | ICD-10-CM

## 2013-12-05 DIAGNOSIS — C50411 Malignant neoplasm of upper-outer quadrant of right female breast: Secondary | ICD-10-CM

## 2013-12-05 DIAGNOSIS — E538 Deficiency of other specified B group vitamins: Secondary | ICD-10-CM

## 2013-12-05 DIAGNOSIS — F17200 Nicotine dependence, unspecified, uncomplicated: Secondary | ICD-10-CM

## 2013-12-05 DIAGNOSIS — Z170 Estrogen receptor positive status [ER+]: Secondary | ICD-10-CM

## 2013-12-05 DIAGNOSIS — R911 Solitary pulmonary nodule: Secondary | ICD-10-CM

## 2013-12-05 LAB — COMPREHENSIVE METABOLIC PANEL (CC13)
ALT: 19 U/L (ref 0–55)
ANION GAP: 6 meq/L (ref 3–11)
AST: 18 U/L (ref 5–34)
Albumin: 3.3 g/dL — ABNORMAL LOW (ref 3.5–5.0)
Alkaline Phosphatase: 81 U/L (ref 40–150)
BUN: 18.8 mg/dL (ref 7.0–26.0)
CHLORIDE: 107 meq/L (ref 98–109)
CO2: 30 meq/L — AB (ref 22–29)
Calcium: 9.5 mg/dL (ref 8.4–10.4)
Creatinine: 0.8 mg/dL (ref 0.6–1.1)
Glucose: 94 mg/dl (ref 70–140)
Potassium: 4 mEq/L (ref 3.5–5.1)
SODIUM: 142 meq/L (ref 136–145)
TOTAL PROTEIN: 6.3 g/dL — AB (ref 6.4–8.3)
Total Bilirubin: 0.6 mg/dL (ref 0.20–1.20)

## 2013-12-05 LAB — CBC WITH DIFFERENTIAL/PLATELET
BASO%: 1.1 % (ref 0.0–2.0)
BASOS ABS: 0.1 10*3/uL (ref 0.0–0.1)
EOS ABS: 0.1 10*3/uL (ref 0.0–0.5)
EOS%: 1.1 % (ref 0.0–7.0)
HEMATOCRIT: 41.1 % (ref 34.8–46.6)
HEMOGLOBIN: 13.3 g/dL (ref 11.6–15.9)
LYMPH%: 22.2 % (ref 14.0–49.7)
MCH: 32.7 pg (ref 25.1–34.0)
MCHC: 32.3 g/dL (ref 31.5–36.0)
MCV: 101.1 fL — AB (ref 79.5–101.0)
MONO#: 0.6 10*3/uL (ref 0.1–0.9)
MONO%: 9.1 % (ref 0.0–14.0)
NEUT%: 66.5 % (ref 38.4–76.8)
NEUTROS ABS: 4.6 10*3/uL (ref 1.5–6.5)
Platelets: 217 10*3/uL (ref 145–400)
RBC: 4.06 10*6/uL (ref 3.70–5.45)
RDW: 13.7 % (ref 11.2–14.5)
WBC: 6.9 10*3/uL (ref 3.9–10.3)
lymph#: 1.5 10*3/uL (ref 0.9–3.3)

## 2013-12-05 NOTE — Progress Notes (Signed)
Checked in new pt with no financial concerns at this time. I informed pt of the [name] and gave her a flyer on what they assist with.  She has Raquel's card if she wants to apply for the grant and for any future questions or concerns.

## 2013-12-05 NOTE — Progress Notes (Signed)
Gabrielle [name]  Telephone:([phone]) [phone] Fax:(336) [phone]     ID: Gabrielle [name] DOB: 1930-05-09  MR#: [id number]  WSF#:[id number]  Patient Care Team: Gabrielle Pap, NP as PCP - General (Nurse Practitioner) Gabrielle Seltzer, MD as Consulting Physician (General Surgery) Gabrielle Cruel, MD as Consulting Physician (Oncology) Gabrielle Edison, MD as Consulting Physician (Radiation Oncology) OTHER MD:  CHIEF COMPLAINT: Triple positive breast cancer  CURRENT TREATMENT: Awaiting definitive surgery   BREAST CANCER HISTORY: Gabrielle [name] had not been to a doctor for "more than 20 years. Since Christmas 2014 and the family has become increasingly concerned that she appeared to be losing weight and was a bit more confused. They asked the patient how much weight she had lost and she said she had lost "about 50 pounds.". However, the only weight we have available before August of this year is from August of 2014 and it was 115 pounds at that time.  Nevertheless with this concern the patient agreed to be seen by a physician and on 10/06/2013 she was seen by Gabrielle [name] who obtained a full battery of tests. He found the patient to have a urinary tract infection with Escherichia coli, which may explain some of her confusion. Vitamin B 12 level was 215, which is in the low normal range. Hemoglobin was 14.7 with an MCV of 99.0. The patient was started on B12 supplementation parenterally and a chest x-ray was obtained 10/05/2013 which showed evidence of emphysema and prior granulomatous disease. To make sure an occult cancer was not being missed a CT scan of the chest abdomen and pelvis was obtained 11/19/2013. This showed a calcified granuloma in the inferior lingula. There was also a noncalcified 6 mm nodule in the lateral portion of the right lower lobe. There was no mediastinal mass or adenopathy. There were calcified granulomata in the spleen as well. There was significant atherosclerotic  calcifications. There was sigmoid diverticulosis noted incidentally. In addition, a 1.8 cm right breast mass was noted.  The breast mass was evaluated further with bilateral diagnostic mammography and right breast ultrasonography of the breast Center 11/26/2013. The breast density was category C. There was indeed an irregular mass in the right breast which was on palpation firm nontender and noted at the 10:30 position. Ultrasound confirmed a hypoechoic irregular mass measuring up to 3 cm. There was no right axillary adenopathy noted.  Biopsy of the right breast mass in question 11/26/2013 showed ([id number]) and invasive ductal carcinoma, grade 2, estrogen receptor 100% positive, progesterone receptor 59% positive, both with strong staining intensity, with an MIB-1 of 31%, and with HER-2 amplification, the signals ratio being 2.79 and the copy number per cell 5.45.  On 11-2013 the patient underwent bilateral breast MRI. This showed the enhancing mass in the upper-outer quadrant of the right breast to measure 2.1 cm. There was no other findings in the right breast, left breast, or either axilla.  The patient's subsequent history is as detailed below  INTERVAL HISTORY: Gabrielle [name] was evaluated in the multidisciplinary breast cancer clinic 12/05/2013 accompanied by her son Gabrielle [name] and daughter Gabrielle [name]  REVIEW OF SYSTEMS: Aside from the concern regarding weight loss and mental status changes, the patient complains of chills, insomnia, fatigue, hearing loss, runny nose, sinus problems, problems with her dentures, hoarseness, ankle swelling, dry cough occasionally productive, poor appetite, stress urinary incontinence, easy bruising, forgetfulness, and anxiety. A detailed review of systems was otherwise noncontributory  PAST MEDICAL HISTORY: Past Medical History  Diagnosis Date  .  Cataract   . Retina hole   . COPD (chronic obstructive pulmonary disease)   . Macular degeneration   . Anxiety     PAST  SURGICAL HISTORY: Past Surgical History  Procedure Laterality Date  . Abdominal hysterectomy  1980  . Cataract extraction Bilateral   . Retinal laser procedure      FAMILY HISTORY Family History  Problem Relation Age of Onset  . Heart attack Mother     Deceased  . Throat cancer Father     Deceased  . Cancer Brother     Deceased  . Other Brother     Deceased, hemorhhage of pancreas   the patient's father died at the age of 76 from throat cancer which has been diagnosed to years before. The patient's mother was diagnosed at age 49 with fallopian tube carcinoma. She died at age 7. There is no other history of breast or ovarian cancer in the family to the patient's knowledge  GYNECOLOGIC HISTORY:  No LMP recorded. Patient has had a hysterectomy. Menarche age 39, first live birth age 18. The patient is GX P2. She underwent hysterectomy at a relatively young age, without salpingo-oophorectomy. She did not take hormone replacement however she took birth control pills remotely for approximately 2 years with no complications.  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 She is divorced. Her son Gabrielle [name] lives in [place] and works as a Metallurgist. Daughter Gabrielle [name] lives in [place] where she works as a Architect for the [employer]. Incidentally Gabrielle [name] is a nephew of the patient    ADVANCED DIRECTIVES: In place; the patient's son can is her healthcare power of attorney. He can be reached at [place]: History  Substance Use Topics  . Smoking status: Current Every Day Smoker -- 1.00 packs/day for 50 years    Types: Cigarettes  . Smokeless tobacco: Never Used  . Alcohol Use: 1.2 oz/week    2 Glasses of wine per week     Comment: wine a couple times weekly     Colonoscopy: Never  [name]:  Bone density: 2014  Lipid panel:  No Known Allergies  Current Outpatient  Prescriptions  Medication Sig Dispense Refill  . Cholecalciferol ([facility] D3) 5000 UNITS capsule Take 1 capsule (5,000 Units total) by mouth daily.  30 capsule  2  . cyanocobalamin (,VITAMIN B-12,) 1000 MCG/ML injection Inject 1 mL (1,000 mcg total) into the muscle every 7 (seven) days. 4 injections q7 X 6wks.  1 mL  0   No current facility-administered medications for this visit.    OBJECTIVE: Elderly white woman in no acute distress Filed Vitals:   12/05/13 1255  BP: 140/60  Pulse: 73  Temp: 98 F (36.7 C)  Resp: 18     Body mass index is 19.15 kg/(m^2).    ECOG FS:1 - Symptomatic but completely ambulatory  Ocular: Sclerae unicteric, pupils equal, round and reactive to light Ear-nose-throat: Oropharynx clear and moist Lymphatic: No cervical or supraclavicular adenopathy Lungs no rales or rhonchi, fair excursion bilaterally Heart regular rate and rhythm, no murmur appreciated Abd soft, nontender, positive bowel sounds MSK no focal spinal tenderness, no joint edema Neuro: non-focal, oriented x3 and with no obvious knowledge deficits, appropriate affect Breasts: The right breast is status post recent biopsy. There is a small ecchymosis. I do not palpate a well-defined mass in the area in question. The right  axilla is benign. The left breast is unremarkable   LAB RESULTS:  CMP     Component Value Date/Time   NA 142 12/05/2013 1231   NA 138 10/05/2013 1158   K 4.0 12/05/2013 1231   K 3.8 10/05/2013 1158   CL 100 10/05/2013 1158   CO2 30* 12/05/2013 1231   CO2 27 10/05/2013 1158   GLUCOSE 94 12/05/2013 1231   GLUCOSE 88 10/05/2013 1158   BUN 18.8 12/05/2013 1231   BUN 15 10/05/2013 1158   CREATININE 0.8 12/05/2013 1231   CREATININE 0.9 10/05/2013 1158   CALCIUM 9.5 12/05/2013 1231   CALCIUM 9.1 10/05/2013 1158   PROT 6.3* 12/05/2013 1231   PROT 6.1 10/05/2013 1158   ALBUMIN 3.3* 12/05/2013 1231   ALBUMIN 3.4* 10/05/2013 1158   AST 18 12/05/2013 1231   AST 23 10/05/2013 1158   ALT 19 12/05/2013 1231    ALT 17 10/05/2013 1158   ALKPHOS 81 12/05/2013 1231   ALKPHOS 66 10/05/2013 1158   BILITOT 0.60 12/05/2013 1231   BILITOT 0.8 10/05/2013 1158    I No results found for this basename: SPEP, UPEP,  kappa and lambda light chains    Lab Results  Component Value Date   WBC 6.9 12/05/2013   NEUTROABS 4.6 12/05/2013   HGB 13.3 12/05/2013   HCT 41.1 12/05/2013   MCV 101.1* 12/05/2013   PLT 217 12/05/2013      Chemistry      Component Value Date/Time   NA 142 12/05/2013 1231   NA 138 10/05/2013 1158   K 4.0 12/05/2013 1231   K 3.8 10/05/2013 1158   CL 100 10/05/2013 1158   CO2 30* 12/05/2013 1231   CO2 27 10/05/2013 1158   BUN 18.8 12/05/2013 1231   BUN 15 10/05/2013 1158   CREATININE 0.8 12/05/2013 1231   CREATININE 0.9 10/05/2013 1158      Component Value Date/Time   CALCIUM 9.5 12/05/2013 1231   CALCIUM 9.1 10/05/2013 1158   ALKPHOS 81 12/05/2013 1231   ALKPHOS 66 10/05/2013 1158   AST 18 12/05/2013 1231   AST 23 10/05/2013 1158   ALT 19 12/05/2013 1231   ALT 17 10/05/2013 1158   BILITOT 0.60 12/05/2013 1231   BILITOT 0.8 10/05/2013 1158       No results found for this basename: LABCA2    No components found with this basename: LABCA125    No results found for this basename: INR,  in the last 168 hours  Urinalysis    Component Value Date/Time   COLORURINE YELLOW 10/05/2013 1158   APPEARANCEUR Cloudy* 10/05/2013 1158   LABSPEC 1.010 10/05/2013 1158   PHURINE 8.5* 10/05/2013 1158   GLUCOSEU NEGATIVE 10/05/2013 1158   HGBUR NEGATIVE 10/05/2013 1158   BILIRUBINUR Neg 10/05/2013 1451   BILIRUBINUR NEGATIVE 10/05/2013 1158   KETONESUR NEGATIVE 10/05/2013 1158   PROTEINUR Trace 10/05/2013 1451   UROBILINOGEN 1.0 10/05/2013 1451   UROBILINOGEN 0.2 10/05/2013 1158   NITRITE Positive 10/05/2013 1451   NITRITE NEGATIVE 10/05/2013 1158   LEUKOCYTESUR Trace 10/05/2013 1451    STUDIES: Ct Chest W Contrast  11/19/2013   CLINICAL DATA:  Weight loss.  EXAM: CT CHEST, ABDOMEN, AND PELVIS WITH CONTRAST  TECHNIQUE: Multidetector CT  imaging of the chest, abdomen and pelvis was performed following the standard protocol during bolus administration of intravenous contrast.  CONTRAST:  113m OMNIPAQUE IOHEXOL 300 MG/ML  SOLN  COMPARISON:  None.  FINDINGS: CT CHEST FINDINGS  No pneumothorax or pleural  effusion is noted. Calcified granuloma is noted in inferior portion of the lingula. No acute pulmonary disease is noted. 6 mm nodule is noted in lateral portion of right lower lobe best seen on image number 39 of series 3. No mediastinal mass or adenopathy is noted. Atherosclerotic calcifications of thoracic aorta are noted without aneurysm or dissection. 18 x 15 mm right breast mass is noted.  CT ABDOMEN AND PELVIS FINDINGS  No gallstones are noted. Small cyst is noted in right hepatic lobe. Calcified granulomata are noted in the spleen. Pancreas appears normal. Adrenal glands and kidneys appear normal. No hydronephrosis or renal obstruction is noted. Atherosclerotic calcifications of abdominal aorta are noted without aneurysm formation. Sigmoid diverticulosis is noted without inflammation. Large amount of stool is noted in the rectum concerning for impaction. No abnormal fluid collection is noted. There is no evidence of bowel obstruction. Urinary bladder appears normal. No significant adenopathy is noted. Status post hysterectomy.  IMPRESSION: Sigmoid diverticulosis is noted without inflammation.  Large amount of stool noted in the rectum concerning for impaction.  18 x 15 mm right breast mass is noted. Mammographic evaluation is recommended to evaluate for possible breast malignancy.  6 mm nodule seen in right lower lobe. If the patient is at high risk for bronchogenic carcinoma, follow-up chest CT at 6-12 months is recommended. If the patient is at low risk for bronchogenic carcinoma, follow-up chest CT at 12 months is recommended. This recommendation follows the consensus statement: Guidelines for Management of Small Pulmonary Nodules Detected on  CT Scans: A Statement from the [place] as published in Radiology 2005;237:395-400.  These results will be called to the ordering clinician or representative by the Radiologist Assistant, and communication documented in the PACS or zVision Dashboard.   Electronically Signed   By: Sabino Dick M.D.   On: 11/19/2013 16:14   Ct Abdomen Pelvis W Contrast  11/19/2013   CLINICAL DATA:  Weight loss.  EXAM: CT CHEST, ABDOMEN, AND PELVIS WITH CONTRAST  TECHNIQUE: Multidetector CT imaging of the chest, abdomen and pelvis was performed following the standard protocol during bolus administration of intravenous contrast.  CONTRAST:  141m OMNIPAQUE IOHEXOL 300 MG/ML  SOLN  COMPARISON:  None.  FINDINGS: CT CHEST FINDINGS  No pneumothorax or pleural effusion is noted. Calcified granuloma is noted in inferior portion of the lingula. No acute pulmonary disease is noted. 6 mm nodule is noted in lateral portion of right lower lobe best seen on image number 39 of series 3. No mediastinal mass or adenopathy is noted. Atherosclerotic calcifications of thoracic aorta are noted without aneurysm or dissection. 18 x 15 mm right breast mass is noted.  CT ABDOMEN AND PELVIS FINDINGS  No gallstones are noted. Small cyst is noted in right hepatic lobe. Calcified granulomata are noted in the spleen. Pancreas appears normal. Adrenal glands and kidneys appear normal. No hydronephrosis or renal obstruction is noted. Atherosclerotic calcifications of abdominal aorta are noted without aneurysm formation. Sigmoid diverticulosis is noted without inflammation. Large amount of stool is noted in the rectum concerning for impaction. No abnormal fluid collection is noted. There is no evidence of bowel obstruction. Urinary bladder appears normal. No significant adenopathy is noted. Status post hysterectomy.  IMPRESSION: Sigmoid diverticulosis is noted without inflammation.  Large amount of stool noted in the rectum concerning for impaction.  18 x  15 mm right breast mass is noted. Mammographic evaluation is recommended to evaluate for possible breast malignancy.  6 mm nodule seen in right lower  lobe. If the patient is at high risk for bronchogenic carcinoma, follow-up chest CT at 6-12 months is recommended. If the patient is at low risk for bronchogenic carcinoma, follow-up chest CT at 12 months is recommended. This recommendation follows the consensus statement: Guidelines for Management of Small Pulmonary Nodules Detected on CT Scans: A Statement from the [place] as published in Radiology 2005;237:395-400.  These results will be called to the ordering clinician or representative by the Radiologist Assistant, and communication documented in the PACS or zVision Dashboard.   Electronically Signed   By: Sabino Dick M.D.   On: 11/19/2013 16:14   Mr Breast Bilateral W Wo Contrast  12/04/2013   CLINICAL DATA:  Recently diagnosed right breast invasive mammary carcinoma. Preop evaluation.  LABS:  BUN and creatinine were obtained on site at [facility] at  [address]  Results:  BUN 27 mg/dL,  Creatinine 0.9 mg/dL.  EXAM: BILATERAL BREAST MRI WITH AND WITHOUT CONTRAST  TECHNIQUE: Multiplanar, multisequence MR images of both breasts were obtained prior to and following the intravenous administration of 30m of MultiHance  THREE-DIMENSIONAL MR IMAGE RENDERING ON INDEPENDENT WORKSTATION:  Three-dimensional MR images were rendered by post-processing of the original MR data on an independent workstation. The three-dimensional MR images were interpreted, and findings are reported in the following complete MRI report for this study. Three dimensional images were evaluated at the independent DynaCad workstation  COMPARISON:  Mammograms dated 11/26/2013.  FINDINGS: Breast composition: c:  Heterogeneous fibroglandular tissue  Background parenchymal enhancement: Mild  Right breast: There is an irregular enhancing mass located within the anterior  1/3 of the upper-outer quadrant of the right breast with central clip artifact which corresponds to the recently diagnosed right breast invasive mammary carcinoma. This measures 2.1 x 1.8 x 1.7 cm in size and is in a subareolar location. There are no additional findings within the right breast.  Left breast: No mass or abnormal enhancement.  Lymph nodes: No abnormal appearing lymph nodes.  Ancillary findings:  None.  IMPRESSION: 2.1 cm irregular enhancing mass located within the anterior third of the upper-outer quadrant of the right breast (11 o'clock position) which is in close proximity to the right nipple. This corresponds to the recently diagnosed right breast invasive mammary carcinoma. No evidence for adenopathy and no additional findings.  RECOMMENDATION: Treatment plan.  BI-RADS CATEGORY  6: Known biopsy-proven malignancy.   Electronically Signed   By: RLuberta RobertsonM.D.   On: 12/04/2013 16:11   Mm Digital Diagnostic Bilat  11/26/2013   CLINICAL DATA:  Right breast mass identified on recent chest CT performed 11/19/2013. The patient has felt a mass in the right breast. Patient reports an approximately 50 pound weight loss.  EXAM: DIGITAL DIAGNOSTIC  BILATERAL MAMMOGRAM WITH CAD  ULTRASOUND RIGHT BREAST  COMPARISON:  None.  ACR Breast Density Category c: The breast tissue is heterogeneously dense, which may obscure small masses.  FINDINGS: There is an irregular mass in the retroareolar right breast. There are a few heterogeneous calcifications within the mass.  There are no findings to suggest malignancy in the left breast.  Mammographic images were processed with CAD.  On physical exam, the patient is very thin. There is a firm, nontender, fixed mass at 10:30 position of the right breast, approximately 1 cm from the nipple.  Ultrasound is performed, showing a hypoechoic irregular mass with prominent internal vascular flow that measures up to 3.0 x 1.3 x 2.3 cm. The superficial aspect of the  mass involve  the overlying skin.  Ultrasound the right axilla demonstrates normal right axillary lymph nodes.  IMPRESSION: 1. 3 cm palpable mass right breast is suspicious for malignancy. Involvement of the overlying skin cannot be excluded. 2. No evidence of malignancy in the left breast.  RECOMMENDATION: Ultrasound-guided core needle biopsy of the right breast mass is recommended. This was discussed with the patient today. She would like to stay to have the biopsy performed today. Biopsy will be dictated separately.  I have discussed the findings and recommendations with the patient. Results were also provided in writing at the conclusion of the visit. If applicable, a reminder letter will be sent to the patient regarding the next appointment.  BI-RADS CATEGORY  5: Highly suggestive of malignancy.   Electronically Signed   By: Curlene Dolphin M.D.   On: 11/26/2013 14:51   Mm Digital Diagnostic Unilat R  11/26/2013   CLINICAL DATA:  Biopsy was performed over suspicious right breast mass.  EXAM: DIAGNOSTIC RIGHT MAMMOGRAM POST ULTRASOUND BIOPSY  COMPARISON:  11/26/2013  FINDINGS: Mammographic images were obtained following ultrasound guided biopsy of suspicious palpable mass 10:30 position right breast. A ribbon-shaped biopsy clip is satisfactorily positioned within the right breast mass.  IMPRESSION: Satisfactory position of ribbon-shaped biopsy clip in the right breast mass.  Final Assessment: Post Procedure Mammograms for Marker Placement   Electronically Signed   By: Curlene Dolphin M.D.   On: 11/26/2013 15:19   US Breast Ltd Uni Right Inc Axilla  11/26/2013   CLINICAL DATA:  Right breast mass identified on recent chest CT performed 11/19/2013. The patient has felt a mass in the right breast. Patient reports an approximately 50 pound weight loss.  EXAM: DIGITAL DIAGNOSTIC  BILATERAL MAMMOGRAM WITH CAD  ULTRASOUND RIGHT BREAST  COMPARISON:  None.  ACR Breast Density Category c: The breast tissue is heterogeneously dense,  which may obscure small masses.  FINDINGS: There is an irregular mass in the retroareolar right breast. There are a few heterogeneous calcifications within the mass.  There are no findings to suggest malignancy in the left breast.  Mammographic images were processed with CAD.  On physical exam, the patient is very thin. There is a firm, nontender, fixed mass at 10:30 position of the right breast, approximately 1 cm from the nipple.  Ultrasound is performed, showing a hypoechoic irregular mass with prominent internal vascular flow that measures up to 3.0 x 1.3 x 2.3 cm. The superficial aspect of the mass involve the overlying skin.  Ultrasound the right axilla demonstrates normal right axillary lymph nodes.  IMPRESSION: 1. 3 cm palpable mass right breast is suspicious for malignancy. Involvement of the overlying skin cannot be excluded. 2. No evidence of malignancy in the left breast.  RECOMMENDATION: Ultrasound-guided core needle biopsy of the right breast mass is recommended. This was discussed with the patient today. She would like to stay to have the biopsy performed today. Biopsy will be dictated separately.  I have discussed the findings and recommendations with the patient. Results were also provided in writing at the conclusion of the visit. If applicable, a reminder letter will be sent to the patient regarding the next appointment.  BI-RADS CATEGORY  5: Highly suggestive of malignancy.   Electronically Signed   By: Curlene Dolphin M.D.   On: 11/26/2013 14:51   [place] Rt Breast Bx W Loc Dev 1st Lesion Img Bx Spec US Guide  11/27/2013   ADDENDUM REPORT: 11/27/2013 13:19  ADDENDUM: Pathology revealed grade II  invasive ductal carcinoma in the right breast. This was found to be concordant by Dr. Conchita Paris. Pathology was discussed with the patient by telephone. She reported doing well after the biopsy. Post biopsy instructions were reviewed and her questions were answered. The patient is scheduled for [facility] on December 05, 2013. She will be scheduled for a bilateral breast MRI. She is encouraged to come to [facility] for educational materials. My number is provided to her for future questions and concerns.  Pathology results reported by Susa Raring RN, BSN on November 27, 2013.   Electronically Signed   By: Curlene Dolphin M.D.   On: 11/27/2013 13:19   11/27/2013   CLINICAL DATA:  Suspicious palpable mass 10:30 position right breast biopsy is recommend.  EXAM: ULTRASOUND GUIDED RIGHT BREAST CORE NEEDLE BIOPSY  COMPARISON:  Previous exams.  FINDINGS: I met with the patient and we discussed the procedure of ultrasound-guided biopsy, including benefits and alternatives. We discussed the high likelihood of a successful procedure. We discussed the risks of the procedure, including infection, bleeding, tissue injury, clip migration, and inadequate sampling. Informed written consent was given. The usual time-out protocol was performed immediately prior to the procedure.  Using sterile technique and 2% Lidocaine as local anesthetic, under direct ultrasound visualization, a 14 gauge spring-loaded device was used to perform biopsy of an approximate 3 cm mass 10:30 position right breast using a lateral to medial approach. At the conclusion of the procedure a tissue marker clip was deployed into the biopsy cavity. Follow up 2 view mammogram was performed and dictated separately.  IMPRESSION: Ultrasound guided biopsy of right breast mass. No apparent complications.  Electronically Signed: By: Curlene Dolphin M.D. On: 11/26/2013 15:17    ASSESSMENT: 80 y.o.  woman status post right breast upper outer quadrant biopsy 11/26/2013 for a clinical T2 N0, stage IIA invasive ductal carcinoma, grade 2, with micropapillary features, triple positive, with an MIB-1 of 31%  (1) genetics testing pending   (2) 6 mm right lower lobe nodule noted on CT scans  11/19/2013  (a) continuing tobacco abuse  (b) emphysema  (3) unexplained/ undocumented weight loss   (4) B12 level in the low normal range  PLAN: We spent the better part of today's hour-long appointment discussing the biology of breast cancer in general, and the specifics of the patient's tumor in particular. Ms. Heyde understands that the standard of care for HER-2 positive patients treated with curative intent includes chemotherapy. In a very elderly patients there is very little data to go by. Generally I prefer to avoid chemotherapy in these patients if at all possible. We could do anti-estrogens alone, but there is very good data that in HER-2 positive patients the response to antiestrogen is blunted by crossed talk from the HER-2 receptor. We have data from stage IV studies that show that adding anti-HER-2 immunotherapy to antiestrogen therapy in these patients not only helps the antiestrogen to work better but also significantly increases the response rate and prolongs the time to recurrence.  Accordingly I proposed to [place] and her children that she be treated with anastrozole and trastuzumab. They understand this is not a standard of care but it is a very effective therapy. We discussed the possible toxicities, side effects and complications of these agents and she will return to see me after she completes her surgery to discuss the details and set that treatment dates. Note that she has already had  an echo which showed an excellent ejection fraction.  The family is convinced that the patient has lost some weight but the only data that the patient has lost "50 pounds" is her own statement. A weight from a year ago was 115 pounds, which is not significantly different from now. Nevertheless the family does have a definite impression that there has been some weight loss. This patient continues to smoke, has emphysema, and does not have evidence of significant metastatic disease (even if the  right lower lobe lung nodule were a metastatic deposit from her breast cancer it would not explain the weight loss). My guess is that she has lost some weight and that it is due to her emphysema and smoking issues.  We were told that the patient had a very low B12 level, but all I can find is a low normal B12 level. I will check anti-intrinsic factor and antiparietal cell antibodies but I suspect the patient may continue with oral supplementation as currently planned.  Gabrielle [name] has a good understanding of the overall plan. She agrees with it. She knows the goal of treatment in her case is cure. She will call with any problems that may develop before her next visit here, which will be in approximately one month.Marland Kitchen  Gabrielle Cruel, MD   12/05/2013 3:46 PM    \

## 2013-12-05 NOTE — Progress Notes (Signed)
Courtesy visit note:  The patient is seen this afternoon while attending the multidisciplinary breast clinic. She'll be scheduled for a mastectomy, therefore there will be no role for radiation therapy in her management. She was reassured. I told her I would be more than happy to see her in the future should her situation change. She will be managed by Dr. Excell Seltzer and Dr. Jana Hakim.

## 2013-12-06 ENCOUNTER — Telehealth: Payer: Self-pay | Admitting: Oncology

## 2013-12-06 ENCOUNTER — Other Ambulatory Visit: Payer: Commercial Managed Care - HMO

## 2013-12-06 ENCOUNTER — Other Ambulatory Visit: Payer: Medicare HMO

## 2013-12-06 NOTE — Telephone Encounter (Signed)
per pof to sch pt appt-cld pt phone fast busy signal-cd sons # recorded message not taling incoming calls-pt appt 10/15-noted to give pt updated copy of sch

## 2013-12-07 ENCOUNTER — Encounter: Payer: Self-pay | Admitting: General Practice

## 2013-12-07 ENCOUNTER — Ambulatory Visit (INDEPENDENT_AMBULATORY_CARE_PROVIDER_SITE_OTHER): Payer: Commercial Managed Care - HMO | Admitting: Internal Medicine

## 2013-12-07 ENCOUNTER — Encounter: Payer: Self-pay | Admitting: Internal Medicine

## 2013-12-07 ENCOUNTER — Ambulatory Visit: Payer: Medicare HMO | Admitting: Nurse Practitioner

## 2013-12-07 VITALS — BP 124/64 | HR 91 | Ht 65.0 in | Wt 114.8 lb

## 2013-12-07 DIAGNOSIS — J449 Chronic obstructive pulmonary disease, unspecified: Secondary | ICD-10-CM | POA: Insufficient documentation

## 2013-12-07 DIAGNOSIS — R05 Cough: Secondary | ICD-10-CM | POA: Insufficient documentation

## 2013-12-07 DIAGNOSIS — R64 Cachexia: Secondary | ICD-10-CM | POA: Insufficient documentation

## 2013-12-07 DIAGNOSIS — Z01811 Encounter for preprocedural respiratory examination: Secondary | ICD-10-CM | POA: Insufficient documentation

## 2013-12-07 DIAGNOSIS — F17200 Nicotine dependence, unspecified, uncomplicated: Secondary | ICD-10-CM | POA: Insufficient documentation

## 2013-12-07 DIAGNOSIS — Z720 Tobacco use: Secondary | ICD-10-CM

## 2013-12-07 DIAGNOSIS — R059 Cough, unspecified: Secondary | ICD-10-CM | POA: Insufficient documentation

## 2013-12-07 DIAGNOSIS — R911 Solitary pulmonary nodule: Secondary | ICD-10-CM | POA: Insufficient documentation

## 2013-12-07 MED ORDER — TIOTROPIUM BROMIDE MONOHYDRATE 2.5 MCG/ACT IN AERS: 2.0000 | INHALATION_SPRAY | Freq: Every day | RESPIRATORY_TRACT | Status: DC

## 2013-12-07 MED ORDER — FLUTICASONE PROPIONATE 50 MCG/ACT NA SUSP: 2.0000 | Freq: Every day | NASAL | Status: DC

## 2013-12-07 NOTE — Progress Notes (Signed)
Subjective:    Patient ID: Gabrielle [name], female    DOB: [birth year], 80 y.o.   MRN: [id number] PCP WEAVER, Allen Kell, NP   HPI  IOV 12/07/2013  Chief Complaint  Patient presents with  . Pulmonary Consult    Pt referred by Dr. Cathie Olden for COPD.     80 year old female. 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 [place] aunt of GI doc [name]. Dr Hilarie Fredrickson and daughter [name] had briefed me about patient prior to visit.  She has had 50# weight loss per hx x 12 months (per [name] notes 12/05/13 no documented loss per epic) or so with some general decline in ? Cognition.  She saw cardiology 11/07/13 and cxr showed emphysema. 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   Proliferation factor 31%   no evidence cancer has spread to lymph nodes   it is estrogen, progesterone and HER2 positive.   Current treatment plan:  mastectomy of right breast, followed by Anastrozole and Herceptin.   Prognosis:  the physicians believe there is a high probability the surgery and medications will cure the cancer.      - 67m RLL nodule  - severe emphysema +  So she has been referred here  She says that leading up to this fall 2015 - absolutely no dyspnea, cough, chest tightness. But now some cough with nasal congestion. In fact, COPD CAT Score below shows mild cough and sputum with very little dyspnea. Fatigue seeems main issue. COPD CAT score is 14 and reflects only mild symptoms burden.   Walk test 185 feet x 3 laps: did not desaturate and denies dysppnea  sPirometry today: fev1 1.5L/74%, Ratio 63  cw  MILDER FORM OF GOLD STAGE 2 COPD   Daughter has sseveral question on email   - addressed in plan  CAT COPD Symptom & Quality of Life  Score ([place] 0 is no burden. 5 is highest burden 12/07/2013   Never Cough -> Cough all the time 3  No phlegm in chest -> Chest is full of phlegm 4  No chest tightness -> Chest feels very tight 0  No dyspnea for 1 flight stairs/hill -> Very dyspneic for 1 flight of stairs 2  No limitations for ADL at home -> Very limited with ADL at home 0  Confident leaving home -> Not at all confident leaving home 0  Sleep soundly -> Do not sleep soundly because of lung condition 0  Lots of Energy -> No energy at all 5  TOTAL Score (max 40)  14        has a past medical history of Cataract; Retina hole; COPD (chronic obstructive pulmonary disease); Macular degeneration; and Anxiety.   has past surgical history that includes Abdominal hysterectomy (1980); Cataract extraction (Bilateral); and Retinal laser procedure.   reports that she has been smoking Cigarettes.  She has a 50 pack-year smoking history. She has never used smokeless tobacco.   Immunization History  Administered Date(s) Administered  . Influenza,inj,Quad PF,36+ Mos 11/21/2013  . Pneumococcal Conjugate-13 11/21/2013   History  Substance Use  Topics  . Smoking status: Current Every Day Smoker -- 1.00 packs/day for 50 years    Types: Cigarettes  . Smokeless tobacco: Never Used  . Alcohol Use: 1.2 oz/week    2 Glasses of wine per week     Comment: wine a couple times weekly    No Known Allergies  Current outpatient prescriptions:Cholecalciferol ([employer] D3) 5000 UNITS capsule, Take 1 capsule (5,000 Units total) by mouth daily., Disp: 30 capsule, Rfl: 2;  cyanocobalamin (,VITAMIN B-12,) 1000 MCG/ML injection, Inject 1 mL (1,000 mcg total) into the muscle every 7 (seven) days. 4 injections q7 X 6wks., Disp: 1 mL, Rfl: 0;  vitamin A 10000 UNIT capsule, Take 10,000 Units by mouth daily., Disp: , Rfl:    Review of Systems  Constitutional: Negative for fever and unexpected weight change.  HENT: Positive for congestion and  postnasal drip. Negative for dental problem, ear pain, nosebleeds, rhinorrhea, sinus pressure, sneezing, sore throat and trouble swallowing.   Eyes: Negative for redness and itching.  Respiratory: Positive for cough. Negative for chest tightness, shortness of breath and wheezing.   Cardiovascular: Positive for leg swelling. Negative for palpitations.  Gastrointestinal: Negative for nausea and vomiting.  Genitourinary: Negative for dysuria.  Musculoskeletal: Negative for joint swelling.  Skin: Negative for rash.  Neurological: Negative for headaches.  Hematological: Does not bruise/bleed easily.  Psychiatric/Behavioral: Negative for dysphoric mood. The patient is not nervous/anxious.        Objective:   Physical Exam  Vitals reviewed. Constitutional: She is oriented to person, place, and time. She appears well-developed and well-nourished. No distress.  Body mass index is 19.1 kg/(m^2).   HENT:  Head: Normocephalic and atraumatic.  Right Ear: External ear normal.  Left Ear: External ear normal.  Mouth/Throat: Oropharynx is clear and moist. No oropharyngeal exudate.  Eyes: Conjunctivae and EOM are normal. Pupils are equal, round, and reactive to light. Right eye exhibits no discharge. Left eye exhibits no discharge. No scleral icterus.  Neck: Normal range of motion. Neck supple. No JVD present. No tracheal deviation present. No thyromegaly present.  Cardiovascular: Normal rate, regular rhythm, normal heart sounds and intact distal pulses.  Exam reveals no gallop and no friction rub.   No murmur heard. Pulmonary/Chest: Effort normal and breath sounds normal. No respiratory distress. She has no wheezes. She has no rales. She exhibits no tenderness.  kyphotic  Abdominal: Soft. Bowel sounds are normal. She exhibits no distension and no mass. There is no tenderness. There is no rebound and no guarding.  Pants appear loose  Musculoskeletal: Normal range of motion. She exhibits no edema and  no tenderness.  Lymphadenopathy:    She has no cervical adenopathy.  Neurological: She is alert and oriented to person, place, and time. She has normal reflexes. No cranial nerve deficit. She exhibits normal muscle tone. Coordination normal.  Skin: Skin is warm and dry. No rash noted. She is not diaphoretic. No erythema. No pallor.  Psychiatric: She has a normal mood and affect. Her behavior is normal. Judgment and thought content normal.  Appears very sharp    Filed Vitals:   12/07/13 1539  BP: 124/64  Pulse: 91  Height: '5\' 5"'  (1.651 m)  Weight: 114 lb 12.8 oz (52.073 kg)  SpO2: 96%   Body mass index is 19.1 kg/(m^2).       Assessment & Plan:  #COPD   - you have copd otherwise called emphysema  - lung function is 74% of normal and you do not need  o2 -  Start spiriva respimat 2 puff once daily schedule; will help with symptoms and for ability to handle surgery  - at this level of lung function most people do well with only some impact in quality of life  - you will be prone to flare up so be careful when you catch a cold or get sick; call us immediately then  - glad you are uptodate with your vaccines  -next visit will check alpha 1 genetic test for copd and discuss pulmonary rehab  #Cough  - due to copd +/- post nasal drainage  - start spiriva  - start nasal steroid  #Weight loss / Cachexia - unclear cause; doubt due to copd  - discuss with primary care doctor WEAVER, LAYNE C, NP about checking thyroid function  #44m RLL lung nodule - sept 2015 - low probability this is lung cancer or breast cancer spread  - do CT chest wo contrast in 9 months   #Preoperative Pulmonary Evaluation for breast cancer surgery  - low risk for pulmonary complications from breast cancer surgery - but please ensure you are on spiriva leading upto surgery -quitting smoking lowers risk for surgery  #Smoking  - if you can please try to quit smoking but if you enjoy it I  understand   #FOllowup  4 weeks with my NP Tammy Parrett    Dr. MBrand Males M.D., FGlen Lehman Endoscopy SuiteC.P Pulmonary and Critical Care Medicine Staff Physician CMelvinPulmonary and Critical Care Pager: [phone] If no answer or between  15:00h - 7:00h: call [phone]  12/07/2013 4:38 PM

## 2013-12-07 NOTE — Patient Instructions (Addendum)
#  COPD   - you have copd otherwise called emphysema  - lung function is 74% of normal and you do not need o2 -  Start spiriva respimat 2 puff once daily schedule; will help with symptoms and for ability to handle surgery  - at this level of lung function most people do well with only some impact in quality of life  - you will be prone to flare up so be careful when you catch a cold or get sick; call us immediately then  - glad you are uptodate with your vaccines  -next visit will check alpha 1 genetic test for copd and discuss pulmonary rehab  #Cough  - due to copd +/- post nasal drainage  - start spiriva  - start nasal steroid  #Weight loss / Cachexia - unclear cause; doubt due to copd  - discuss with primary care doctor WEAVER, LAYNE C, NP about checking thyroid function  #69mm RLL lung nodule - sept 2015 - low probability this is lung cancer or breast cancer spread  - do CT chest wo contrast in 9 months   #Preoperative Pulmonary Evaluation for breast cancer surgery  - low risk for pulmonary complications from breast cancer surgery - but please ensure you are on spiriva leading upto surgery -quitting smoking lowers risk for surgery  #Smoking  - if you can please try to quit smoking but if you enjoy it I understand   #FOllowup  4 weeks with my NP Tammy Parrett

## 2013-12-07 NOTE — Progress Notes (Signed)
[place] Psychosocial Distress Screening Spiritual Care  Visited with Gabrielle [name], Gabrielle [name], and son Gabrielle [name] at breast clinic to introduce [facility] and to review distress screening per protocol.  The patient scored a 4 on the Psychosocial Distress Thermometer which indicates moderate distress. Provided spiritual and emotional support to whole family, and assessed for distress and other psychosocial needs.   ONCBCN DISTRESS SCREENING 12/07/2013  Screening Type Initial Screening  Elta [place] the number that describes how much distress you have been experiencing in the past week 4  Emotional problem type Nervousness/Anxiety;Adjusting to illness  Physical Problem type Skin dry/itchy  Referral to support programs Yes  Other Spiritual Care, Counseling Intern    Chaplain follow up needed: No.  Counseling Intern Elson Clan assisted pt and family in developing a perspective of learning/growth opportunity, which family appreciated.  Pt/family aware of ongoing chaplain and counseling availability.  [place], [place]

## 2013-12-10 ENCOUNTER — Telehealth: Payer: Self-pay | Admitting: *Deleted

## 2013-12-10 ENCOUNTER — Telehealth: Payer: Self-pay | Admitting: Nurse Practitioner

## 2013-12-10 NOTE — Telephone Encounter (Signed)
Spoke to pt concerning [place] from 12/05/13. Pt denies questions or concerns regarding dx or treatment care plan. Pt did received pulmonary clearance. Message sent to Dr. Lear Ng nurse informing of clearance. Confirmed genetics appt and f/u appt with Dr. Jana Hakim. Pt denies further needs. Encourage pt to call with concerns. Received verbal understanding. Contact information given.

## 2013-12-10 NOTE — Telephone Encounter (Signed)
emmi emailed °

## 2013-12-11 ENCOUNTER — Telehealth: Payer: Self-pay | Admitting: Internal Medicine

## 2013-12-11 NOTE — Telephone Encounter (Signed)
Please tell her to look at her email. IF she still wants a call back I Can tomorrow or day after [facility] can reply to my email

## 2013-12-11 NOTE — Telephone Encounter (Signed)
Called spoke with pt daughter. Pt started the spiriva resp today. They were called today and pt surgery for mastectomy is next Tuesday. She is concerned that this may not be good for pt when she tries to recover and would still be coughing.  1) She wants to know if MR feels they should r/s surgery?  2) They are not able to get pt to stop smoking. Daughter wants to know if e-cigs or vapor would be a good alternative?  Please advise thanks

## 2013-12-12 NOTE — Telephone Encounter (Signed)
Spoke with pt daughter [name]-- received e-mail [name] would like a call back from Dr Chase Caller to discuss e-mail.

## 2013-12-13 ENCOUNTER — Other Ambulatory Visit: Payer: Commercial Managed Care - HMO

## 2013-12-13 ENCOUNTER — Ambulatory Visit (HOSPITAL_BASED_OUTPATIENT_CLINIC_OR_DEPARTMENT_OTHER): Payer: Commercial Managed Care - HMO | Admitting: Genetic Counselor

## 2013-12-13 ENCOUNTER — Telehealth: Payer: Self-pay | Admitting: *Deleted

## 2013-12-13 ENCOUNTER — Encounter: Payer: Self-pay | Admitting: Genetic Counselor

## 2013-12-13 ENCOUNTER — Other Ambulatory Visit: Payer: Self-pay | Admitting: *Deleted

## 2013-12-13 ENCOUNTER — Encounter (HOSPITAL_COMMUNITY): Payer: Self-pay | Admitting: Pharmacy Technician

## 2013-12-13 ENCOUNTER — Encounter: Payer: Self-pay | Admitting: Neurology

## 2013-12-13 ENCOUNTER — Ambulatory Visit (INDEPENDENT_AMBULATORY_CARE_PROVIDER_SITE_OTHER): Payer: Commercial Managed Care - HMO | Admitting: Neurology

## 2013-12-13 VITALS — BP 128/70 | HR 90 | Ht 66.0 in | Wt 111.3 lb

## 2013-12-13 DIAGNOSIS — R413 Other amnesia: Secondary | ICD-10-CM

## 2013-12-13 DIAGNOSIS — C50411 Malignant neoplasm of upper-outer quadrant of right female breast: Secondary | ICD-10-CM

## 2013-12-13 DIAGNOSIS — Z808 Family history of malignant neoplasm of other organs or systems: Secondary | ICD-10-CM

## 2013-12-13 DIAGNOSIS — Z803 Family history of malignant neoplasm of breast: Secondary | ICD-10-CM

## 2013-12-13 DIAGNOSIS — E538 Deficiency of other specified B group vitamins: Secondary | ICD-10-CM

## 2013-12-13 DIAGNOSIS — Z315 Encounter for genetic counseling: Secondary | ICD-10-CM

## 2013-12-13 DIAGNOSIS — F0390 Unspecified dementia without behavioral disturbance: Secondary | ICD-10-CM

## 2013-12-13 DIAGNOSIS — C50911 Malignant neoplasm of unspecified site of right female breast: Secondary | ICD-10-CM

## 2013-12-13 NOTE — Progress Notes (Signed)
Follow-up Visit   Date: 12/13/2013    Gabrielle [name] MRN: [id number] DOB: 1931-01-28   Interim History: Gabrielle [name] is a 80 y.o. right-handed Caucasian female with history of tobacco use returning to the clinic for follow-up of memory loss.  The patient was accompanied to the clinic by daughter who also provides collateral information.    History of present illness: Since January 2015, her son noticed changes in her mother's cognitive ability. She has been having problems with short-term memory and not has "sharp" as she used to be. She is highly functioning at baseline and works part-time at [employer] and has not noticed any problems at work. Problems are mostly with short-term memory such as remembering names, dates, appointments and she has started to write things down more. Her son and daughter have noticed the changes more than patient. Hobbies include reading, working, and lately she has been organizing her ex-husbands home since he passed away last year. They had been divorced for 20+ years.   She is driving without any difficulty. She denies getting lost or being involved in any car accidents. She cooks for herself and eats 2-3 meals per day. Her son says that her response is slower, such as when talking in conversation. Her mood is good. Sleep is fair, averaging 6-7 hours per night. No bizarre or inappropriate behavior changes.  She has not seen a physician in 10-15 years and saw primary care physician on Friday. She was found to have UTI and started on ciprofloxacin (not started taking it yet).    Of note, she has lost 50lb unintentionally in the past 68-months. Appetite remains good.   UPDATE 12/13/2013:  She was found to have right breast cancer and is scheduled to have mastectomy next week.  She is having a harder time learning new tasks and continues to have problems with short-term memory.  She has difficulty maintaining her mail, e-mails, lost her checkbook in her mail,  and daughter says her home is in disarray.  She usually eats about two meals a day, usually gets takeout or eats frozen meals.  Family does not have any concerns with home safety or with her driving. She has not been involved in any motor vehicle accidents. On a rare occasion, she does admit to getting lost.   Medications:  Current Outpatient Prescriptions on File Prior to Visit  Medication Sig Dispense Refill  . vitamin A 10000 UNIT capsule Take 10,000 Units by mouth every morning.        No current facility-administered medications on file prior to visit.    Allergies: No Known Allergies   Review of Systems:  CONSTITUTIONAL: No fevers, chills, night sweats, +weight loss.   EYES: No visual changes or eye pain ENT: No hearing changes.  No history of nose bleeds.   RESPIRATORY: No cough, wheezing and shortness of breath.   CARDIOVASCULAR: Negative for chest pain, and palpitations.   GI: Negative for abdominal discomfort, blood in stools or black stools.  No recent change in bowel habits.   GU:  No history of incontinence.   MUSCLOSKELETAL: No history of joint pain or swelling.  No myalgias.   SKIN: Negative for lesions, rash, and itching.   ENDOCRINE: Negative for cold or heat intolerance, polydipsia or goiter.   PSYCH:  No  depression or anxiety symptoms.   NEURO: As Above.   Vital Signs:  BP 128/70  Pulse 90  Ht 5\' 6"  (1.676 m)  Wt 111 lb  5 oz (50.491 kg)  BMI 17.97 kg/m2  SpO2 97%   Neurological Exam:  MENTAL STATUS:  Awake, flat affect Montreal Cognitive Assessment  12/13/2013 10/08/2013  Visuospatial/ Executive (0/5) 4 2  Naming (0/3) 3 2  Attention: Read list of digits (0/2) 2 2  Attention: Read list of letters (0/1) 1 1  Attention: Serial 7 subtraction starting at 100 (0/3) 3 1  Language: Repeat phrase (0/2) 2 2  Language : Fluency (0/1) 0 0  Abstraction (0/2) 2 2  Delayed Recall (0/5) 2 3  Orientation (0/6) 5 5  Total 24 20  Adjusted Score (based on  education) 25 20   CRANIAL NERVES: Pupils equal round and reactive to light.  Normal conjugate, extra-ocular eye movements in all directions of gaze.  No ptosis. Normal facial sensation.  Face is symmetric. Palate elevates symmetrically.  Tongue is midline.  MOTOR:  Motor strength is 5-/5 in all extremities.  No atrophy, fasciculations or abnormal movements.  No pronator drift.  Tone is normal.    MSRs:  Reflexes are 2+/4 throughout, except absent Achilles bilaterally.  SENSORY:  Vibration is diminished at ankles bilaterally.  COORDINATION/GAIT:  Normal finger-to- nose-finger and heel-to-shin.  Intact rapid alternating movements bilaterally.  Stooped posture, gait narrow based and stable.   Data: Lab Results  Component Value Date   VITAMINB12 215 10/05/2013   Lab Results  Component Value Date   TSH 1.70 10/05/2013     IMPRESSION: Gabrielle [name] is 80 year old female presenting for evaluation of  short-term memory loss. No significant memory changes since her last visit, and daughter feels that she is somewhat better.  Objectively, she did perform better on her MOCA test today (25/30) compared to her initial visit in August (20/30) which may have been due to untreated urinary tract infection as well as low vitamin B12 levels. Based on her history, she complains of not only short-term memory loss but also difficulty completing her instrumental ADLs.  She most likely has early dementia, possibly age related.  Given that she was very independent and even working part-time over the summer, I do want to obtain MRI of the brain because of her relative abrupt decline in cognition.  She also has other medical comorbidities including newly diagnosed breast cancer. Going forward, I discussed neuropsychological testing can be obtained to better characterize the type of dementia. Patient declined medication such as aricept because she will be starting chemotherapy after her mastectomy and would like to minimize  potential side effects, which is reasonable.  We also had a lengthy discussion regarding home safety and establishing POA, which she has already done.  Currently, her family does not have any safety issues with her living alone or driving, but this may be an issue to address again.   PLAN/RECOMMENDATIONS:  1.  MRI brain without contrast 2.  Continue vitamin B12 1082mcg daily 3.  OK to hold off on neuropsychiatric testing for now 4.  Discussed to limit driving to local distances only and during the daytime 5.  Return to clinic in 81-months, or sooner as needed   The duration of this appointment visit was 45 minutes of face-to-face time with the patient.  Greater than 50% of this time was spent in counseling, explanation of diagnosis, planning of further management, and coordination of care.   Thank you for allowing me to participate in patient's care.  If I can answer any additional questions, I would be pleased to do so.    Sincerely,  Staphanie Harbison K. Laci Frenkel, DO    

## 2013-12-13 NOTE — Telephone Encounter (Signed)
Received call from daughter stating her mom's car broke down and will not be able to make her pre-op appointment tomorrow.  She was unable to get anyone at [facility] pre-op.  I called and cancelled her appointment and they stated someone would call her tomorrow to reschedule her  for Monday 12/17/13.

## 2013-12-13 NOTE — Progress Notes (Signed)
Patient Name: Gabrielle [name] Patient Age: 80 y.o. Encounter Date: 12/13/2013  Referring Physician: Georga Hacking, MD Gabrielle Seltzer, MD  Primary Care Provider: Irene Pap, NP   [name], a 80 y.o. female, is being seen at the [facility] due to a personal and family history of cancer. She presents to clinic today with her daughter, Gabrielle [name], to discuss the possibility of a hereditary predisposition to cancer and discuss whether genetic testing is warranted.  HISTORY OF PRESENT ILLNESS: [name] was diagnosed with right breast cancer (IDC) recently at the age of 11. She is scheduled for a right mastectomy on 12/18/13.  The breast tumor was ER positive, PR positive, and HER2 positive.  [name] has never had a colonoscopy.  Past Medical History  Diagnosis Date  . Cataract   . Retina hole   . COPD (chronic obstructive pulmonary disease)   . Macular degeneration   . Anxiety   . Breast cancer of upper-outer quadrant of right female breast     Past Surgical History  Procedure Laterality Date  . Cataract extraction Bilateral   . Retinal laser procedure    . Abdominal hysterectomy  1980    (ovaries intact)    History   Social History  . Marital Status: Divorced    Spouse Name: N/A    Number of Children: 2  . Years of Education: N/A   Occupational History  . tax preparer     [employer]   Social History Main Topics  . Smoking status: Current Every Day Smoker -- 1.00 packs/day for 50 years    Types: Cigarettes  . Smokeless tobacco: Never Used  . Alcohol Use: 1.2 oz/week    2 Glasses of wine per week     Comment: wine a couple times weekly  . Drug Use: No  . Sexual Activity: No   Other Topics Concern  . Not on file   Social History Narrative   [name] lives alone in a [place] home. She had two grown children.   She continues to work part-time for [employer].    She has a grown son-lives in [place] in [place]. [name] is her nephew.     FAMILY HISTORY:   During the visit, a 4-generation pedigree was obtained. Family tree will be sent for scanning and will be in EPIC under the Media tab.  Significant diagnoses include the following:  Family History  Problem Relation Age of Onset  . Heart attack Mother     Deceased  . Cancer Mother     fallopian tube cancer in 84s; deceased 60  . Throat cancer Father     Deceased 67; smoker  . Prostate cancer Brother 30    Currently 20  . Other Brother     Deceased, hemorhhage of pancreas  . Leukemia Maternal Aunt   . Breast cancer Paternal Aunt     age at diagnosis unknown  . Throat cancer Paternal Uncle     Deceased 75s; smoker  . Leukemia Paternal Grandmother     Additionally, [name] has a son (age 40) and a daughter (age 36). Her mother had only one brother and no sisters.  [name] ancestry is [place]. There is no known Jewish ancestry and no consanguinity.  ASSESSMENT AND PLAN: [name] is a 80 y.o. female with a personal history of breast cancer at age 30 and family history of fallopian tube cancer in her mother  diagnosed in her 62s. There is a family history of breast cancer in her paternal aunt, but the age at diagnosis in not known. This history is not highly suggestive of a hereditary predisposition to cancer, but she is highly motivated to get testing to be able to provide information to her family. We reviewed the characteristics, features and inheritance patterns of hereditary cancer syndromes. We also discussed genetic testing, including the process of testing, insurance coverage and implications of results. A negative result will be very reassuring.  [name] wished to pursue genetic testing and a blood sample will be sent to [facility] for analysis of the BRCA1, BRCA2 and PALB2 genes. This lab was chosen because she does not meet Medicare's criteria for testing. We discussed the implications of a positive, negative and/ or Variant of  Uncertain Significance (VUS) result. Results should be available in approximately 3-4 weeks, at which point we will contact her and address implications for her as well as address genetic testing for at-risk family members, if needed.    We encouraged [name] to remain in contact with Cancer Genetics annually so that we can update the family history and inform her of any changes in cancer genetics and testing that may be of benefit for this family. Ms.  Gabrielle [name] questions were answered to her satisfaction today.   Thank you for the referral and allowing [place] to share in the care of your patient.   The patient was seen for a total of 40 minutes, greater than 50% of which was spent face-to-face counseling. This patient was discussed with the overseeing provider who agrees with the above.   Steele Berg, MS, [place] Certified Genetic Counseor phone: [phone] [email or web address]

## 2013-12-13 NOTE — Telephone Encounter (Signed)
Pt called requesting to speak to Dr. Excell Seltzer concerning change in surgery to bilateral mastectomies since "everyone else is doing it". I gave pt some information and education on mastectomy and survival and recurrence. Discussed prolonged healing d/t her current smoking status. By the end of our conversation and discussing pros and cons of bilateral mastectomies, pt relay if Dr. Excell Seltzer doesn't feel it is a good decision she would only get one mastectomy. Relayed pt concerns and discussion need to Dr. Excell Seltzer for further discussion.  Pt daughter called as well to discuss pt want for bilateral mastectomy and her concerns. Informed pt that I would reach out to both her mother and Dr. Excell Seltzer to convey concerns related to COPD, cough, and prolonged sedation d/t bilateral mastectomies. Encourage daughter to call with further questions. Contact information given.

## 2013-12-13 NOTE — Patient Instructions (Signed)
1.  MRI brain without contrast 2.  Continue vitamin B12 1085mcg daily 3.  OK to hold off on neuropsychiatric testing 4.  Return to clinic in 54-months

## 2013-12-14 ENCOUNTER — Encounter (HOSPITAL_COMMUNITY): Payer: Self-pay

## 2013-12-14 ENCOUNTER — Inpatient Hospital Stay (HOSPITAL_COMMUNITY): Admission: RE | Admit: 2013-12-14 | Payer: Commercial Managed Care - HMO | Source: Ambulatory Visit

## 2013-12-14 ENCOUNTER — Encounter (HOSPITAL_COMMUNITY)
Admission: RE | Admit: 2013-12-14 | Discharge: 2013-12-14 | Disposition: A | Discharge status: Home / Self Care | Payer: Commercial Managed Care - HMO | Source: Ambulatory Visit | Attending: General Surgery | Admitting: General Surgery

## 2013-12-14 DIAGNOSIS — C50919 Malignant neoplasm of unspecified site of unspecified female breast: Secondary | ICD-10-CM | POA: Diagnosis not present

## 2013-12-14 DIAGNOSIS — Z01818 Encounter for other preprocedural examination: Secondary | ICD-10-CM | POA: Diagnosis present

## 2013-12-14 HISTORY — DX: Pneumonia, unspecified organism: J18.9

## 2013-12-14 NOTE — Progress Notes (Signed)
EKG 11/07/2013 in epic CT chest 11/19/2013 in epic Echo 11/22/2013 epic  Labs-CBCD and CMP per EPIC 12/05/2013 LOV with Dr Acie Fredrickson 11/07/2013 in epic Dr Heywood Bene LOV 12/07/2013 per epic

## 2013-12-14 NOTE — Telephone Encounter (Signed)
Sent email to [place] is life long  - nasal steroids: atleast a few months and then decide  Dr. Brand Males, M.D., [facility].C.P Pulmonary and Critical Care Medicine Staff Physician [facility] Pager: [phone], If no answer or between  15:00h - 7:00h: call [phone]  12/14/2013 2:54 PM

## 2013-12-14 NOTE — Telephone Encounter (Signed)
Called spoke with pt's daughter [name].  [name] reports the majority of questions have already been answered by MR.  One additional question though regarding the Spiriva and nasal spray >> [name] understands that pt needs to continue these leading up to her surgery but wonders if pt needs to continue these in the hospital afterwards and during recuperation in the long-term?  [name] requests response be sent via email.  Please advise, thank you.  Per the 10.9.15 ov w/ MR:  Patient Instructions      #COPD   - you have copd otherwise called emphysema  - lung function is 74% of normal and you do not need o2 -  Start spiriva respimat 2 puff once daily schedule; will help with symptoms and for ability to handle surgery  - at this level of lung function most people do well with only some impact in quality of life  - you will be prone to flare up so be careful when you catch a cold or get sick; call us immediately then  - glad you are uptodate with your vaccines  -next visit will check alpha 1 genetic test for copd and discuss pulmonary rehab  #Cough  - due to copd +/- post nasal drainage  - start spiriva  - start nasal steroid  #Weight loss / Cachexia - unclear cause; doubt due to copd  - discuss with primary care doctor WEAVER, LAYNE C, NP about checking thyroid function  #35mm RLL lung nodule - sept 2015 - low probability this is lung cancer or breast cancer spread  - do CT chest wo contrast in 9 months   #Preoperative Pulmonary Evaluation for breast cancer surgery  - low risk for pulmonary complications from breast cancer surgery - but please ensure you are on spiriva leading upto surgery -quitting smoking lowers risk for surgery  #Smoking  - if you can please try to quit smoking but if you enjoy it I understand   #FOllowup  4 weeks with my NP Tammy Parrett

## 2013-12-14 NOTE — Patient Instructions (Signed)
[place]  12/14/2013   Your procedure is scheduled on:     12/18/2013              Report to [facility] Main Entrance and follow signs to  [place] arrive at [place] AM.   Call this number if you have problems the morning of surgery [phone] or Presurgical Testing [phone].   Remember:  Do not eat food or drink liquids :After Midnight.  For Living Will and/or Health Care Power Attorney Forms: please provide copy for your medical record, may bring AM of surgery (forms should be already notarized-we do not provide this service).   Take these medicines the morning of surgery:Spiriva (bring with you)                               You may not have any metal on your body including hair pins and piercings  Do not wear jewelry, make-up, lotions, powders, or deodorant.  Do not shave body hair  48 hours(2 days) of CHG soap use.              Do not bring valuables to the hospital. [place].  Contacts, dentures or bridgework may not be worn into surgery.  Leave suitcase in the car. After surgery it may be brought to your room.  For patients admitted to the hospital, checkout time is 11:00 AM the day of discharge.   ________________________________________________________________________  [facility] - Preparing for Surgery Before surgery, you can play an important role.  Because skin is not sterile, your skin needs to be as free of germs as possible.  You can reduce the number of germs on your skin by washing with CHG (chlorahexidine gluconate) soap before surgery.  CHG is an antiseptic cleaner which kills germs and bonds with the skin to continue killing germs even after washing. Please DO NOT use if you have an allergy to CHG or antibacterial soaps.  If your skin becomes reddened/irritated stop using the CHG and inform your nurse when you arrive at Short Stay. Do not shave (including legs and underarms) for at least 48 hours prior  to the first CHG shower.  You may shave your face/neck. Please follow these instructions carefully:  1.  Shower with CHG Soap the night before surgery and the  morning of Surgery.  2.  If you choose to wash your hair, wash your hair first as usual with your  normal  shampoo.  3.  After you shampoo, rinse your hair and body thoroughly to remove the  shampoo.                           4.  Use CHG as you would any other liquid soap.  You can apply chg directly  to the skin and wash                       Gently with a scrungie or clean washcloth.  5.  Apply the CHG Soap to your body ONLY FROM THE NECK DOWN.   Do not use on face/ open                           Wound or open sores. Avoid contact with eyes, ears mouth and genitals (private parts).  Wash face,  Genitals (private parts) with your normal soap.             6.  Wash thoroughly, paying special attention to the area where your surgery  will be performed.  7.  Thoroughly rinse your body with warm water from the neck down.  8.  DO NOT shower/wash with your normal soap after using and rinsing off  the CHG Soap.                9.  Pat yourself dry with a clean towel.            10.  Wear clean pajamas.            11.  Place clean sheets on your bed the night of your first shower and do not  sleep with pets. Day of Surgery : Do not apply any lotions/deodorants the morning of surgery.  Please wear clean clothes to the hospital/surgery center.  FAILURE TO FOLLOW THESE INSTRUCTIONS MAY RESULT IN THE CANCELLATION OF YOUR SURGERY PATIENT SIGNATURE_________________________________  NURSE SIGNATURE__________________________________  ________________________________________________________________________

## 2013-12-17 ENCOUNTER — Encounter (HOSPITAL_COMMUNITY): Payer: Self-pay | Admitting: Anesthesiology

## 2013-12-17 LAB — VITAMIN B12: VITAMIN B 12: 1268 pg/mL — AB (ref 211–911)

## 2013-12-17 LAB — ANTI-PARIETAL ANTIBODY: PARIETAL CELL ANTIBODY-IGG: NEGATIVE

## 2013-12-17 LAB — INTRINSIC FACTOR ANTIBODIES: Intrinsic Factor: NEGATIVE

## 2013-12-17 LAB — CEA: CEA: 4.1 ng/mL (ref 0.0–5.0)

## 2013-12-17 NOTE — Anesthesia Preprocedure Evaluation (Addendum)
Anesthesia Evaluation  Patient identified by MRN, date of birth, ID band Patient awake    Reviewed: Allergy & Precautions, H&P , NPO status , Patient's Chart, lab work & pertinent test results  History of Anesthesia Complications (+) PONV and history of anesthetic complications  Airway Mallampati: II TM Distance: >3 FB Neck ROM: Full    Dental  (+) Edentulous Upper   Pulmonary shortness of breath, pneumonia -, COPDCurrent Smoker,  breath sounds clear to auscultation  Pulmonary exam normal       Cardiovascular negative cardio ROS  Rhythm:Regular Rate:Normal     Neuro/Psych negative neurological ROS  negative psych ROS   GI/Hepatic negative GI ROS, Neg liver ROS,   Endo/Other  negative endocrine ROS  Renal/GU negative Renal ROS  negative genitourinary   Musculoskeletal negative musculoskeletal ROS (+)   Abdominal   Peds negative pediatric ROS (+)  Hematology negative hematology ROS (+)   Anesthesia Other Findings   Reproductive/Obstetrics negative OB ROS                          Anesthesia Physical Anesthesia Plan  ASA: III  Anesthesia Plan: General   Post-op Pain Management:    Induction: Intravenous  Airway Management Planned: LMA  Additional Equipment:   Intra-op Plan:   Post-operative Plan: Extubation in OR  Informed Consent: I have reviewed the patients History and Physical, chart, labs and discussed the procedure including the risks, benefits and alternatives for the proposed anesthesia with the patient or authorized representative who has indicated his/her understanding and acceptance.   Dental advisory given  Plan Discussed with: CRNA  Anesthesia Plan Comments:         Anesthesia Quick Evaluation

## 2013-12-18 ENCOUNTER — Ambulatory Visit (HOSPITAL_COMMUNITY): Payer: Medicare HMO | Admitting: Anesthesiology

## 2013-12-18 ENCOUNTER — Other Ambulatory Visit: Payer: Self-pay | Admitting: Oncology

## 2013-12-18 ENCOUNTER — Encounter (HOSPITAL_COMMUNITY): Payer: Medicare HMO | Admitting: Anesthesiology

## 2013-12-18 ENCOUNTER — Encounter (HOSPITAL_COMMUNITY): Payer: Self-pay | Admitting: *Deleted

## 2013-12-18 ENCOUNTER — Observation Stay (HOSPITAL_COMMUNITY)
Admission: RE | Admit: 2013-12-18 | Discharge: 2013-12-19 | Disposition: A | Discharge status: Home / Self Care | Payer: Medicare HMO | Source: Ambulatory Visit | Attending: Surgery | Admitting: Surgery

## 2013-12-18 ENCOUNTER — Encounter (HOSPITAL_COMMUNITY): Admission: RE | Disposition: A | Discharge status: Home / Self Care | Payer: Self-pay | Source: Ambulatory Visit

## 2013-12-18 DIAGNOSIS — F17210 Nicotine dependence, cigarettes, uncomplicated: Secondary | ICD-10-CM | POA: Diagnosis not present

## 2013-12-18 DIAGNOSIS — C50919 Malignant neoplasm of unspecified site of unspecified female breast: Secondary | ICD-10-CM | POA: Diagnosis present

## 2013-12-18 DIAGNOSIS — C50911 Malignant neoplasm of unspecified site of right female breast: Principal | ICD-10-CM | POA: Insufficient documentation

## 2013-12-18 DIAGNOSIS — Z79899 Other long term (current) drug therapy: Secondary | ICD-10-CM | POA: Insufficient documentation

## 2013-12-18 HISTORY — PX: TOTAL MASTECTOMY: SHX6129

## 2013-12-18 SURGERY — MASTECTOMY, SIMPLE
Anesthesia: General | Site: Breast | Laterality: Right

## 2013-12-18 MED ORDER — ONDANSETRON HCL 4 MG/2ML IJ SOLN: 4.0000 mg | Freq: Four times a day (QID) | INTRAMUSCULAR | Status: DC | PRN

## 2013-12-18 MED ORDER — ONDANSETRON HCL 4 MG PO TABS: 4.0000 mg | ORAL_TABLET | Freq: Four times a day (QID) | ORAL | Status: DC | PRN

## 2013-12-18 MED ORDER — HEPARIN SOD (PORK) LOCK FLUSH 100 UNIT/ML IV SOLN
INTRAVENOUS | Status: AC
  Filled 2013-12-18: qty 5

## 2013-12-18 MED ORDER — CEFAZOLIN SODIUM-DEXTROSE 2-3 GM-% IV SOLR
INTRAVENOUS | Status: AC
  Filled 2013-12-18: qty 50

## 2013-12-18 MED ORDER — FENTANYL CITRATE 0.05 MG/ML IJ SOLN
INTRAMUSCULAR | Status: AC
  Filled 2013-12-18: qty 2

## 2013-12-18 MED ORDER — POTASSIUM CHLORIDE IN NACL 20-0.9 MEQ/L-% IV SOLN
INTRAVENOUS | Status: DC
  Administered 2013-12-18: 13:00:00 via INTRAVENOUS
  Administered 2013-12-19: 75 mL via INTRAVENOUS
  Filled 2013-12-18 (×3): qty 1000

## 2013-12-18 MED ORDER — CISATRACURIUM BESYLATE (PF) 10 MG/5ML IV SOLN
INTRAVENOUS | Status: DC | PRN
  Administered 2013-12-18: 4 mg via INTRAVENOUS

## 2013-12-18 MED ORDER — 0.9 % SODIUM CHLORIDE (POUR BTL) OPTIME
TOPICAL | Status: DC | PRN
  Administered 2013-12-18: 1000 mL

## 2013-12-18 MED ORDER — PROPOFOL 10 MG/ML IV BOLUS
INTRAVENOUS | Status: AC
  Filled 2013-12-18: qty 20

## 2013-12-18 MED ORDER — NEOSTIGMINE METHYLSULFATE 10 MG/10ML IV SOLN
INTRAVENOUS | Status: DC | PRN
  Administered 2013-12-18: 4 mg via INTRAVENOUS

## 2013-12-18 MED ORDER — HEPARIN SODIUM (PORCINE) 5000 UNIT/ML IJ SOLN
5000.0000 [IU] | Freq: Three times a day (TID) | INTRAMUSCULAR | Status: DC
  Administered 2013-12-18 – 2013-12-19 (×3): 5000 [IU] via SUBCUTANEOUS
  Filled 2013-12-18 (×6): qty 1

## 2013-12-18 MED ORDER — FENTANYL CITRATE 0.05 MG/ML IJ SOLN
INTRAMUSCULAR | Status: AC
  Filled 2013-12-18: qty 5

## 2013-12-18 MED ORDER — LACTATED RINGERS IV SOLN
INTRAVENOUS | Status: DC | PRN
  Administered 2013-12-18 (×2): via INTRAVENOUS

## 2013-12-18 MED ORDER — SODIUM CHLORIDE 0.9 % IR SOLN
Freq: Once | Status: DC
  Filled 2013-12-18: qty 1.2

## 2013-12-18 MED ORDER — BUPIVACAINE-EPINEPHRINE 0.25% -1:200000 IJ SOLN
INTRAMUSCULAR | Status: AC
  Filled 2013-12-18: qty 1

## 2013-12-18 MED ORDER — CISATRACURIUM BESYLATE 20 MG/10ML IV SOLN
INTRAVENOUS | Status: AC
  Filled 2013-12-18: qty 10

## 2013-12-18 MED ORDER — SUCCINYLCHOLINE CHLORIDE 20 MG/ML IJ SOLN
INTRAMUSCULAR | Status: DC | PRN
  Administered 2013-12-18: 80 mg via INTRAVENOUS

## 2013-12-18 MED ORDER — LACTATED RINGERS IV SOLN
INTRAVENOUS | Status: DC
  Administered 2013-12-18: 1000 mL via INTRAVENOUS

## 2013-12-18 MED ORDER — PROPOFOL 10 MG/ML IV BOLUS
INTRAVENOUS | Status: DC | PRN
  Administered 2013-12-18: 150 mg via INTRAVENOUS

## 2013-12-18 MED ORDER — GLYCOPYRROLATE 0.2 MG/ML IJ SOLN
INTRAMUSCULAR | Status: AC
  Filled 2013-12-18: qty 2

## 2013-12-18 MED ORDER — CEFAZOLIN SODIUM-DEXTROSE 2-3 GM-% IV SOLR
2.0000 g | INTRAVENOUS | Status: AC
  Administered 2013-12-18: 2 g via INTRAVENOUS

## 2013-12-18 MED ORDER — NEOSTIGMINE METHYLSULFATE 10 MG/10ML IV SOLN
INTRAVENOUS | Status: AC
  Filled 2013-12-18: qty 1

## 2013-12-18 MED ORDER — ONDANSETRON HCL 4 MG/2ML IJ SOLN
INTRAMUSCULAR | Status: DC | PRN
  Administered 2013-12-18: 4 mg via INTRAVENOUS

## 2013-12-18 MED ORDER — GLYCOPYRROLATE 0.2 MG/ML IJ SOLN
INTRAMUSCULAR | Status: DC | PRN
  Administered 2013-12-18: 0.4 mg via INTRAVENOUS

## 2013-12-18 MED ORDER — MORPHINE SULFATE 2 MG/ML IJ SOLN
2.0000 mg | INTRAMUSCULAR | Status: DC | PRN
  Administered 2013-12-18: 2 mg via INTRAVENOUS
  Filled 2013-12-18: qty 1

## 2013-12-18 MED ORDER — FENTANYL CITRATE 0.05 MG/ML IJ SOLN
25.0000 ug | INTRAMUSCULAR | Status: DC | PRN
  Administered 2013-12-18 (×2): 25 ug via INTRAVENOUS

## 2013-12-18 MED ORDER — FENTANYL CITRATE 0.05 MG/ML IJ SOLN
INTRAMUSCULAR | Status: DC | PRN
  Administered 2013-12-18: 25 ug via INTRAVENOUS
  Administered 2013-12-18: 100 ug via INTRAVENOUS
  Administered 2013-12-18: 25 ug via INTRAVENOUS
  Administered 2013-12-18 (×2): 50 ug via INTRAVENOUS

## 2013-12-18 MED ORDER — EPHEDRINE SULFATE 50 MG/ML IJ SOLN
INTRAMUSCULAR | Status: AC
  Filled 2013-12-18: qty 1

## 2013-12-18 MED ORDER — ONDANSETRON HCL 4 MG/2ML IJ SOLN
INTRAMUSCULAR | Status: AC
  Filled 2013-12-18: qty 2

## 2013-12-18 MED ORDER — EPHEDRINE SULFATE 50 MG/ML IJ SOLN
INTRAMUSCULAR | Status: DC | PRN
  Administered 2013-12-18 (×2): 5 mg via INTRAVENOUS
  Administered 2013-12-18 (×2): 10 mg via INTRAVENOUS

## 2013-12-18 MED ORDER — HYDROCODONE-ACETAMINOPHEN 5-325 MG PO TABS
1.0000 | ORAL_TABLET | ORAL | Status: DC | PRN
  Administered 2013-12-18: 1 via ORAL
  Filled 2013-12-18: qty 1

## 2013-12-18 SURGICAL SUPPLY — 45 items
ADH SKN CLS APL DERMABOND .7 (GAUZE/BANDAGES/DRESSINGS) ×1
BINDER BREAST LRG (GAUZE/BANDAGES/DRESSINGS) ×1 IMPLANT
BLADE HEX COATED 2.75 (ELECTRODE) ×1 IMPLANT
BLADE SURG 15 STRL LF DISP TIS (BLADE) IMPLANT
BLADE SURG 15 STRL SS (BLADE)
BLADE SURG SZ10 CARB STEEL (BLADE) IMPLANT
CANISTER SUCTION 2500CC (MISCELLANEOUS) IMPLANT
CHLORAPREP W/TINT 10.5 ML (MISCELLANEOUS) ×2 IMPLANT
CHLORAPREP W/TINT 26ML (MISCELLANEOUS) ×1 IMPLANT
DECANTER SPIKE VIAL GLASS SM (MISCELLANEOUS) IMPLANT
DERMABOND ADVANCED (GAUZE/BANDAGES/DRESSINGS) ×1
DERMABOND ADVANCED .7 DNX12 (GAUZE/BANDAGES/DRESSINGS) ×1 IMPLANT
DEVICE DISSECT PLASMABLAD 3.0S (MISCELLANEOUS) IMPLANT
DRAIN CHANNEL 19F RND (DRAIN) ×1 IMPLANT
DRAPE LAPAROTOMY TRNSV 102X78 (DRAPE) IMPLANT
ELECT REM PT RETURN 9FT ADLT (ELECTROSURGICAL) ×4
ELECTRODE REM PT RTRN 9FT ADLT (ELECTROSURGICAL) ×2 IMPLANT
EVACUATOR SILICONE 100CC (DRAIN) ×2 IMPLANT
GAUZE SPONGE 4X4 12PLY STRL (GAUZE/BANDAGES/DRESSINGS) IMPLANT
GAUZE SPONGE 4X4 16PLY XRAY LF (GAUZE/BANDAGES/DRESSINGS) ×1 IMPLANT
GLOVE BIOGEL PI IND STRL 7.0 (GLOVE) ×1 IMPLANT
GLOVE BIOGEL PI INDICATOR 7.0 (GLOVE) ×3
GOWN STRL REUS W/TWL LRG LVL3 (GOWN DISPOSABLE) IMPLANT
GOWN STRL REUS W/TWL XL LVL3 (GOWN DISPOSABLE) ×6 IMPLANT
KIT BASIN OR (CUSTOM PROCEDURE TRAY) ×2 IMPLANT
MANIFOLD NEPTUNE II (INSTRUMENTS) ×2 IMPLANT
MARKER SKIN DUAL TIP RULER LAB (MISCELLANEOUS) ×2 IMPLANT
NEEDLE HYPO 22GX1.5 SAFETY (NEEDLE) IMPLANT
NEEDLE HYPO 25X1 1.5 SAFETY (NEEDLE) IMPLANT
NS IRRIG 1000ML POUR BTL (IV SOLUTION) ×2 IMPLANT
PACK BASIC VI WITH GOWN DISP (CUSTOM PROCEDURE TRAY) IMPLANT
PACK GENERAL/GYN (CUSTOM PROCEDURE TRAY) ×2 IMPLANT
PAD ABD 8X10 STRL (GAUZE/BANDAGES/DRESSINGS) ×2 IMPLANT
PENCIL BUTTON HOLSTER BLD 10FT (ELECTRODE) IMPLANT
PLASMABLADE 3.0S (MISCELLANEOUS) ×2
SOL PREP POV-IOD 4OZ 10% (MISCELLANEOUS) IMPLANT
SPONGE DRAIN TRACH 4X4 STRL 2S (GAUZE/BANDAGES/DRESSINGS) ×2 IMPLANT
SUT ETHILON 3 0 PS 1 (SUTURE) ×1 IMPLANT
SUT MNCRL AB 4-0 PS2 18 (SUTURE) ×1 IMPLANT
SUT VIC AB 3-0 SH 18 (SUTURE) ×3 IMPLANT
SYR CONTROL 10ML LL (SYRINGE) IMPLANT
TAPE CLOTH SURG 6X10 WHT LF (GAUZE/BANDAGES/DRESSINGS) ×2 IMPLANT
TOWEL OR 17X26 10 PK STRL BLUE (TOWEL DISPOSABLE) ×2 IMPLANT
WATER STERILE IRR 1500ML POUR (IV SOLUTION) IMPLANT
YANKAUER SUCT BULB TIP 10FT TU (MISCELLANEOUS) ×2 IMPLANT

## 2013-12-18 NOTE — Anesthesia Postprocedure Evaluation (Signed)
  Anesthesia Post-op Note  Patient: Gabrielle [name]  Procedure(s) Performed: Procedure(s) (LRB): RIGHT TOTAL MASTECTOMY (Right)  Patient Location: PACU  Anesthesia Type: General  Level of Consciousness: awake and alert   Airway and Oxygen Therapy: Patient Spontanous Breathing  Post-op Pain: mild  Post-op Assessment: Post-op Vital signs reviewed, Patient's Cardiovascular Status Stable, Respiratory Function Stable, Patent Airway and No signs of Nausea or vomiting  Last Vitals:  Filed Vitals:   12/18/13 1124  BP: 103/45  Pulse: 52  Temp: 36.5 C  Resp: 12    Post-op Vital Signs: stable   Complications: No apparent anesthesia complications

## 2013-12-18 NOTE — Transfer of Care (Signed)
Immediate Anesthesia Transfer of Care Note  Patient: Gabrielle [name]  Procedure(s) Performed: Procedure(s): RIGHT TOTAL MASTECTOMY (Right)  Patient Location: PACU  Anesthesia Type:General  Level of Consciousness: awake, alert  and oriented  Airway & Oxygen Therapy: Patient Spontanous Breathing and Patient connected to face mask oxygen  Post-op Assessment: Report given to PACU RN  Post vital signs: Reviewed and stable  Complications: No apparent anesthesia complications

## 2013-12-18 NOTE — Op Note (Signed)
Preoperative Diagnosis: cancer right breast  Postoprative Diagnosis: cancer right breast  Procedure: Procedure(s): RIGHT TOTAL MASTECTOMY   Surgeon: Excell Seltzer T   Assistants: None  Anesthesia:  General LMA anesthesia  Indications: Patient is an 80 year old female with a recent diagnosis of T2 cancer of the right breast. After extensive preoperative evaluation and discussion detailed elsewhere we elected to proceed with right simple mastectomy as her initial surgical therapy. She is brought to the operating room for this procedure.  Of note is her permit included Port-A-Cath placement but I have not discussed this with her or was aware that it was indicated Or included in the procedure and I personally discussed with her oncologist this morning this issue and he does not request Port-A-Cath at this time and therefore this will not be done.    Procedure Detail:  Patient brought to the operating room, placed in the supine position on the operating table and general laryngeal mask anesthesia was induced. She was carefully positioned with the right arm extended and the entire right chest and breast and upper arm were widely sterilely prepped and draped. She received preoperative IV antibiotics. PA is replaced. The patient time out was performed and correct procedure verified.  An elliptical incision encompassing the nipple areolar complex was performed and subcutaneous flaps raised superior vena cava the clavicle, medially to the edge of the sternum, inferiorly to the inframammary crease laterally out to the anterior border of the latissimus dorsi which was dissected. The breast was then reflected off the chest wall beginning superior medially and working laterally taking the specimen off the pectoralis major and working laterally and dissecting off of the serratus and the lateral border of the pectoralis which was completely freed. The specimen was dissected off the anterior border of the  latissimus working superiorly until it was isolated down to the axillary contents we came across the very low axilla. The specimen was oriented and sent for permanent pathology. The wound was thoroughly irrigated and complete hemostasis obtained. A 19 round Blake closed suction drain was left through a separate stab wound under both flaps in the subcutaneous tissue was closed with interrupted 3-0 Vicryl and the skin with subcuticular 4-0 Monocryl and Dermabond. Sponge needle and instrument counts were correct.    Findings: As above  Estimated Blood Loss:  Minimal         Drains: 19 round Blake  Blood Given: none          Specimens: Right total mastectomy        Complications:  * No complications entered in OR log *         Disposition: PACU - hemodynamically stable.         Condition: stable

## 2013-12-18 NOTE — H&P (Signed)
History of Present Illness Marland Kitchen T. Brek Reece MD; 12/05/2013 1:57 PM) The patient is a 80 year old female who presents with breast cancer. Patint is a 80 year old menopausal female referred by Dr. Curlene Dolphin for evaluation of recently diagnosed carcinoma of the right breast. She recently underwent a CT scan of the chest and abdomen and pelvis as part of a workup for an unintentiona l50 pound weight loss over the last year and a half. This was negative for any source of weight loss but did reveal a mass in the right breast. There was also noted to be a tiny 6 mm lung nodule.. Subsequent imaging included ultrasound showing A 3 cm mass very near the nipple with probably some overlying skin involvement.. An ultrasound guided breast biopsy was performed on 11/26/2013 with pathology revealing invasive ductal carcinoma, grade 2, ER and PR positive, HER-2 positive . subsequent bilateral breast MRI confirmed the known cancer in close proximity to the right nipple measuring 2.1 cm. She is seen now in [facility] for initial treatment planning. She has experienced no breast symptoms. She does not have a personal history of any previous breast problems. Findings at that time were the following: Tumor size: 3 cm Tumor grade: 2 Estrogen Receptor: POS Progesterone Receptor: POS Her-2 neu: POS Lymph node status: NEG   Other Problems Marlowe Kays Goettsch; 12/05/2013 9:49 AM) No pertinent past medical history  Past Surgical History Mirian Mo; 12/05/2013 9:49 AM) Cataract Surgery Bilateral. Hysterectomy (not due to cancer) - Partial  Diagnostic Studies History Marlowe Kays Goettsch; 12/05/2013 9:49 AM) Colonoscopy never Mammogram within last year Pap Smear never  Social History Marlowe Kays Goettsch; 12/05/2013 9:49 AM) Alcohol use Occasional alcohol use. Caffeine use Coffee. No drug use Tobacco use Current every day smoker.  Family History Mirian Mo; 12/05/2013 9:49 AM) Alcohol Abuse Brother,  Father. Diabetes Mellitus Brother. Heart Disease Brother, Mother.  Pregnancy / Birth History Mirian Mo; 12/05/2013 9:49 AM) Age at menarche 80 years. Gravida 2 Maternal age 74-25 Para 2  Review of Systems Mirian Mo; 12/05/2013 9:49 AM) General Present- Appetite Loss and Fatigue. Not Present- Chills, Fever, Night Sweats, Weight Gain and Weight Loss. Skin Not Present- Change in Wart/Mole, Dryness, Hives, Jaundice, New Lesions, Non-Healing Wounds, Rash and Ulcer. HEENT Present- Wears glasses/contact lenses. Not Present- Earache, Hearing Loss, Hoarseness, Nose Bleed, Oral Ulcers, Ringing in the Ears, Seasonal Allergies, Sinus Pain, Sore Throat, Visual Disturbances and Yellow Eyes. Respiratory Present- Chronic Cough. Not Present- Bloody sputum, Difficulty Breathing, Snoring and Wheezing. Breast Present- Breast Mass. Not Present- Breast Pain, Nipple Discharge and Skin Changes. Cardiovascular Not Present- Chest Pain, Difficulty Breathing Lying Down, Leg Cramps, Palpitations, Rapid Heart Rate, Shortness of Breath and Swelling of Extremities. Gastrointestinal Not Present- Abdominal Pain, Bloating, Bloody Stool, Change in Bowel Habits, Chronic diarrhea, Constipation, Difficulty Swallowing, Excessive gas, Gets full quickly at meals, Hemorrhoids, Indigestion, Nausea, Rectal Pain and Vomiting. Musculoskeletal Not Present- Back Pain, Joint Pain, Joint Stiffness, Muscle Pain, Muscle Weakness and Swelling of Extremities. Neurological Present- Decreased Memory. Not Present- Fainting, Headaches, Numbness, Seizures, Tingling, Tremor, Trouble walking and Weakness. Psychiatric Present- Anxiety. Not Present- Bipolar, Change in Sleep Pattern, Depression, Fearful and Frequent crying. Endocrine Not Present- Cold Intolerance, Excessive Hunger, Hair Changes, Heat Intolerance, Hot flashes and New Diabetes. Hematology Present- Easy Bruising. Not Present- Excessive bleeding, Gland problems, HIV and  Persistent Infections.   Physical Exam Marland Kitchen T. Alese Furniss MD; 12/05/2013 2:01 PM) The physical exam findings are as follows: Note:General: Alert, A thin and somewhat chronically ill-appearing elderly  Caucasian female, in no distress Skin: Warm and dry without rash or infection. atrophic HEENT: No palpable masses or thyromegaly. Sclera nonicteric. Pupils equal round and reactive. Oropharynx clear. Lymph nodes: No cervical, supraclavicular, or inguinal nodes palpable. Breasts: Small ptotic breasts bilaterally. There is a firm approximately 2-1/2-3 cm mass just adjacent to the right nipple laterally. Possibly some skin fixation here. No palpable axillary adenopathy. Lungs: Breath sounds distant but and equal. No wheezing or increased work of breathing. Cardiovascular: Regular rate and rhythm without murmer. No JVD or edema. Peripheral pulses intact. No carotid bruits. Abdomen: Nondistended. Soft and nontender. No masses palpable. No organomegaly. No palpable hernias. Extremities: 1+ edema no joint swelling or deformity. No chronic venous stasis changes. Neurologic: Alert and fully oriented. Gait normal. No focal weakness. Psychiatric: Normal mood and affect. Thought content appropriate with normal judgement and insight    Assessment & Plan Marland Kitchen T. Rever Pichette MD; 12/05/2013 2:06 PM) BREAST CANCER, RIGHT (174.9  C50.911) Impression: 80 year old female with a new diagnosis of cancer of the right breast, UO quadrant. Clinical stage IIA, ER+, PR+, HER-2+. I discussed with the patient and family members present today initial surgical treatment options. We discussed options of breast conservation with lumpectomy or total mastectomy and sentinal lymph node biopsy/dissection. She would require a central lumpectomy and with her very small ptotic breasts would leave relatively little breast tissue. After discussion they have elected to proceed with right total mastectomy.As she would not be a candidate  for chemotherapy we will not plan lymph node biopsy.. We discussed the indications and nature of the procedure, and expected recovery, in detail. Surgical risks including anesthetic complications, cardiorespiratory complications, bleeding, infection, wound healing complications, blood clots, lymphedema, local and distant recurrence and possible need for further surgery based on the final pathology was discussed and understood. Chemotherapy, hormonal therapy and radiation therapy have been discussed. They have been provided with literature regarding the treatment of breast cancer. All questions were answered. They understand and agree to proceed and we will go ahead with scheduling. She has a pulmonary consultation scheduled later this week. Current Plans  Schedule for Surgery Right total mastectomy with overnight observation under general anesthesia

## 2013-12-19 ENCOUNTER — Encounter (HOSPITAL_COMMUNITY): Payer: Self-pay | Admitting: General Surgery

## 2013-12-19 DIAGNOSIS — C50911 Malignant neoplasm of unspecified site of right female breast: Secondary | ICD-10-CM | POA: Diagnosis not present

## 2013-12-19 LAB — BASIC METABOLIC PANEL
Anion gap: 9 (ref 5–15)
BUN: 17 mg/dL (ref 6–23)
CO2: 24 meq/L (ref 19–32)
Calcium: 8.1 mg/dL — ABNORMAL LOW (ref 8.4–10.5)
Chloride: 103 mEq/L (ref 96–112)
Creatinine, Ser: 0.71 mg/dL (ref 0.50–1.10)
GFR calc Af Amer: >90 mL/min (ref >90)
GFR calc non Af Amer: 79 mL/min — ABNORMAL LOW (ref >90)
GLUCOSE: 90 mg/dL (ref 70–99)
Potassium: 4.7 mEq/L (ref 3.7–5.3)
Sodium: 136 mEq/L — ABNORMAL LOW (ref 137–147)

## 2013-12-19 LAB — CBC
HEMATOCRIT: 31.6 % — AB (ref 36.0–46.0)
HEMOGLOBIN: 10.6 g/dL — AB (ref 12.0–15.0)
MCH: 33.7 pg (ref 26.0–34.0)
MCHC: 33.5 g/dL (ref 30.0–36.0)
MCV: 100.3 fL — AB (ref 78.0–100.0)
Platelets: 188 10*3/uL (ref 150–400)
RBC: 3.15 MIL/uL — AB (ref 3.87–5.11)
RDW: 13.4 % (ref 11.5–15.5)
WBC: 6.3 10*3/uL (ref 4.0–10.5)

## 2013-12-19 MED ORDER — HYDROCODONE-ACETAMINOPHEN 5-325 MG PO TABS: 1.0000 | ORAL_TABLET | ORAL | Status: DC | PRN

## 2013-12-19 NOTE — Discharge Instructions (Signed)
[facility] °[phone] ° °MASTECTOMY: POST OP INSTRUCTIONS ° °Always review your discharge instruction sheet given to you by the facility where your surgery was performed. °IF YOU HAVE DISABILITY OR FAMILY LEAVE FORMS, YOU MUST BRING THEM TO THE OFFICE FOR PROCESSING.   °DO NOT GIVE THEM TO YOUR DOCTOR. °A prescription for pain medication may be given to you upon discharge.  Take your pain medication as prescribed, if needed.  If narcotic pain medicine is not needed, then you may take acetaminophen (Tylenol) or ibuprofen (Advil) as needed. °1. Take your usually prescribed medications unless otherwise directed. °2. If you need a refill on your pain medication, please contact your pharmacy.  They will contact our office to request authorization.  Prescriptions will not be filled after 5pm or on week-ends. °3. You should follow a light diet the first few days after arrival home, such as soup and crackers, etc.  Resume your normal diet the day after surgery. °4. Most patients will experience some swelling and bruising on the chest and underarm.  Ice packs will help.  Swelling and bruising can take several days to resolve.  °5. It is common to experience some constipation if taking pain medication after surgery.  Increasing fluid intake and taking a stool softener (such as Colace) will usually help or prevent this problem from occurring.  A mild laxative (Milk of Magnesia or Miralax) should be taken according to package instructions if there are no bowel movements after 48 hours. °6. Unless discharge instructions indicate otherwise, leave your bandage dry and in place until your next appointment in 3-5 days.  You may take a limited sponge bath.  No tube baths or showers until the drains are removed.  You may have steri-strips (small skin tapes) in place directly over the incision.  These strips should be left on the skin for 7-10 days.  If your surgeon used skin glue on the incision, you may  shower in 24 hours.  The glue will flake off over the next 2-3 weeks.  Any sutures or staples will be removed at the office during your follow-up visit. °7. DRAINS:  If you have drains in place, it is important to keep a list of the amount of drainage produced each day in your drains.  Before leaving the hospital, you should be instructed on drain care.  Call our office if you have any questions about your drains. °8. ACTIVITIES:  You may resume regular (light) daily activities beginning the next day--such as daily self-care, walking, climbing stairs--gradually increasing activities as tolerated.  You may have sexual intercourse when it is comfortable.  Refrain from any heavy lifting or straining until approved by your doctor. °a. You may drive when you are no longer taking prescription pain medication, you can comfortably wear a seatbelt, and you can safely maneuver your car and apply brakes. °b. RETURN TO WORK:  __________________________________________________________ °9. You should see your doctor in the office for a follow-up appointment approximately 3-5 days after your surgery.  Your doctor’s nurse will typically make your follow-up appointment when she calls you with your pathology report.  Expect your pathology report 2-3 business days after your surgery.  You may call to check if you do not hear from us after three days.   °10. OTHER INSTRUCTIONS: ______________________________________________________________________________________________ ____________________________________________________________________________________________ °WHEN TO CALL YOUR DOCTOR: °1. Fever over 101.0 °2. Nausea and/or vomiting °3. Extreme swelling or bruising °4. Continued bleeding from incision. °5. Increased pain, redness, or drainage from the incision. °  The clinic staff is available to answer your questions during regular business hours.  Please don’t hesitate to call and ask to speak to one of the nurses for clinical  concerns.  If you have a medical emergency, go to the nearest emergency room or call 911.  A surgeon from [facility] is always on call at the hospital. °[address], [address], [place], [place]  [address] ? [address] [address], [place], [place]   [address] °[phone] ? [phone] ? FAX [phone] °Web site: [email or web address] °

## 2013-12-19 NOTE — Progress Notes (Signed)
Patient ID: Gabrielle [name], female   DOB: November 23, 1930, 80 y.o.   MRN: [id number] 1 Day Post-Op  Subjective: No major C/O, just sore  Objective: Vital signs in last 24 hours: Temp:  [97.7 F (36.5 C)-98.4 F (36.9 C)] 98.1 F (36.7 C) (10/21 0600) Pulse Rate:  [51-97] 76 (10/21 0600) Resp:  [8-16] 16 (10/21 0600) BP: (96-134)/(45-57) 107/55 mmHg (10/21 0600) SpO2:  [94 %-100 %] 97 % (10/21 0600)    Intake/Output from previous day: 10/20 0701 - 10/21 0700 In: 3330 [I.V.:3330] Out: 473 [Urine:350; Drains:123] Intake/Output this shift:    General appearance: alert, cooperative and no distress Incision/Wound: clean and dry without swelling or bruising. JP drainage is serous sanguinous.  Lab Results:   Recent Labs  12/19/13 0425  WBC 6.3  HGB 10.6*  HCT 31.6*  PLT 188   BMET  Recent Labs  12/19/13 0425  NA 136*  K 4.7  CL 103  CO2 24  GLUCOSE 90  BUN 17  CREATININE 0.71  CALCIUM 8.1*     Studies/Results: No results found.  Anti-infectives: Anti-infectives   Start     Dose/Rate Route Frequency Ordered Stop   12/18/13 0540  ceFAZolin (ANCEF) IVPB 2 g/50 mL premix     2 g 100 mL/hr over 30 Minutes Intravenous On call to O.R. 12/18/13 0540 12/18/13 0733      Assessment/Plan: s/p Procedure(s): RIGHT TOTAL MASTECTOMY Doing well postoperatively without complications. Okay for discharge.   LOS: 1 day    Gabrielle [name] T 12/19/2013

## 2013-12-19 NOTE — Discharge Summary (Signed)
   Patient ID: Gabrielle [name] [id number] 80 y.o. April 28, 1930  12/18/2013  Discharge date and time: 12/19/2013   Admitting Physician: Excell Seltzer T  Discharge Physician: Excell Seltzer T  Admission Diagnoses: cancer right breast  Discharge Diagnoses: Same  Operations: Procedure(s): RIGHT TOTAL MASTECTOMY  Admission Condition: good  Discharged Condition: good  Indication for Admission: Patient is an 80 year old female with a recent diagnosis of a T2 cancer of the right breast. After extensive consultation and discussion detailed elsewhere we elected to proceed with right total mastectomy as her initial surgical treatment.  Hospital Course: Patient on the morning of admission and underwent an uneventful right total mastectomy. She had no complications. The following morning she has only soreness. Incision is clean without bleeding or hematoma. She is already for discharge.   Disposition: Home  Patient Instructions:    Medication List         [employer] D3 5000 UNITS capsule  Generic drug:  Cholecalciferol  Take 5,000 Units by mouth every morning.     HYDROcodone-acetaminophen 5-325 MG per tablet  Commonly known as:  NORCO/VICODIN  Take 1-2 tablets by mouth every 4 (four) hours as needed for moderate pain.     SPIRIVA RESPIMAT 2.5 MCG/ACT Aers  Generic drug:  Tiotropium Bromide Monohydrate  Inhale 2 puffs into the lungs every morning. @@ 11 am     vitamin A 10000 UNIT capsule  Take 10,000 Units by mouth every morning.     VITAMIN B-12 PO  Take 2 drops by mouth.        Activity: activity as tolerated Diet: regular diet Wound Care: empty JP drain twice daily and record amounts  Follow-up:  With Dr. Excell Seltzer in 1 week.  Signed: Edward Jolly MD, FACS  12/19/2013, 8:17 AM

## 2013-12-19 NOTE — Progress Notes (Signed)
UR completed 

## 2013-12-21 ENCOUNTER — Telehealth: Payer: Self-pay | Admitting: Oncology

## 2013-12-21 NOTE — Telephone Encounter (Signed)
returned dtr's call re changing time of 10/28 lab to coord w/4:45pm appt @ [facility]. gv dtr tracy new time for lab 10/28 @ 3:30pm.

## 2013-12-25 ENCOUNTER — Other Ambulatory Visit: Payer: Self-pay | Admitting: *Deleted

## 2013-12-25 DIAGNOSIS — C50411 Malignant neoplasm of upper-outer quadrant of right female breast: Secondary | ICD-10-CM

## 2013-12-26 ENCOUNTER — Other Ambulatory Visit (HOSPITAL_BASED_OUTPATIENT_CLINIC_OR_DEPARTMENT_OTHER): Payer: Commercial Managed Care - HMO

## 2013-12-26 DIAGNOSIS — C50411 Malignant neoplasm of upper-outer quadrant of right female breast: Secondary | ICD-10-CM

## 2013-12-26 LAB — COMPREHENSIVE METABOLIC PANEL (CC13)
ALT: 20 U/L (ref 0–55)
AST: 20 U/L (ref 5–34)
Albumin: 3.4 g/dL — ABNORMAL LOW (ref 3.5–5.0)
Alkaline Phosphatase: 83 U/L (ref 40–150)
Anion Gap: 7 mEq/L (ref 3–11)
BILIRUBIN TOTAL: 0.48 mg/dL (ref 0.20–1.20)
BUN: 20.9 mg/dL (ref 7.0–26.0)
CHLORIDE: 106 meq/L (ref 98–109)
CO2: 29 mEq/L (ref 22–29)
CREATININE: 0.9 mg/dL (ref 0.6–1.1)
Calcium: 9.4 mg/dL (ref 8.4–10.4)
Glucose: 135 mg/dl (ref 70–140)
POTASSIUM: 4.1 meq/L (ref 3.5–5.1)
Sodium: 142 mEq/L (ref 136–145)
Total Protein: 6.3 g/dL — ABNORMAL LOW (ref 6.4–8.3)

## 2013-12-26 LAB — CBC WITH DIFFERENTIAL/PLATELET
BASO%: 0.6 % (ref 0.0–2.0)
BASOS ABS: 0 10*3/uL (ref 0.0–0.1)
EOS ABS: 0.1 10*3/uL (ref 0.0–0.5)
EOS%: 1.7 % (ref 0.0–7.0)
HCT: 40.1 % (ref 34.8–46.6)
HEMOGLOBIN: 13.4 g/dL (ref 11.6–15.9)
LYMPH#: 1.6 10*3/uL (ref 0.9–3.3)
LYMPH%: 24.2 % (ref 14.0–49.7)
MCH: 33.3 pg (ref 25.1–34.0)
MCHC: 33.4 g/dL (ref 31.5–36.0)
MCV: 99.8 fL (ref 79.5–101.0)
MONO#: 0.5 10*3/uL (ref 0.1–0.9)
MONO%: 8 % (ref 0.0–14.0)
NEUT%: 65.5 % (ref 38.4–76.8)
NEUTROS ABS: 4.4 10*3/uL (ref 1.5–6.5)
Platelets: 252 10*3/uL (ref 145–400)
RBC: 4.02 10*6/uL (ref 3.70–5.45)
RDW: 13.4 % (ref 11.2–14.5)
WBC: 6.6 10*3/uL (ref 3.9–10.3)

## 2014-01-02 ENCOUNTER — Ambulatory Visit (HOSPITAL_BASED_OUTPATIENT_CLINIC_OR_DEPARTMENT_OTHER): Payer: Commercial Managed Care - HMO | Admitting: Oncology

## 2014-01-02 VITALS — BP 128/61 | HR 75 | Temp 97.5°F | Resp 18 | Ht 66.0 in | Wt 112.2 lb

## 2014-01-02 DIAGNOSIS — R911 Solitary pulmonary nodule: Secondary | ICD-10-CM

## 2014-01-02 DIAGNOSIS — R634 Abnormal weight loss: Secondary | ICD-10-CM

## 2014-01-02 DIAGNOSIS — Z720 Tobacco use: Secondary | ICD-10-CM

## 2014-01-02 DIAGNOSIS — C50411 Malignant neoplasm of upper-outer quadrant of right female breast: Secondary | ICD-10-CM

## 2014-01-02 DIAGNOSIS — F17210 Nicotine dependence, cigarettes, uncomplicated: Secondary | ICD-10-CM

## 2014-01-02 DIAGNOSIS — J431 Panlobular emphysema: Secondary | ICD-10-CM

## 2014-01-02 MED ORDER — ANASTROZOLE 1 MG PO TABS: 1.0000 mg | ORAL_TABLET | Freq: Every day | ORAL | Status: DC

## 2014-01-02 NOTE — Progress Notes (Signed)
[place]  Telephone:([phone]) [phone] Fax:(336) [phone]     ID: Gabrielle [name] DOB: [birth year]  MR#: [id number]  MWN#:[id number]  Patient Care Team: Gabrielle Pap, NP as PCP - General (Nurse Practitioner) Excell Seltzer, MD as Consulting Physician (General Surgery) Chauncey Cruel, MD as Consulting Physician (Oncology) Rexene Edison, MD as Consulting Physician (Radiation Oncology) OTHER MD:  CHIEF COMPLAINT: Triple positive breast cancer  CURRENT TREATMENT:  Anastrozole, ttrasztuzumab   BREAST CANCER HISTORY: From the original intake note:  Gabrielle [name] had not been to a doctor for "more than 20 years. Since Christmas 2014 and the family has become increasingly concerned that she appeared to be losing weight and was a bit more confused. They asked the patient how much weight she had lost and she said she had lost "about 50 pounds.". However, the only weight we have available before August of this year is from August of 2014 and it was 115 pounds at that time.  Nevertheless with this concern the patient agreed to be seen by a physician and on 10/06/2013 she was seen by Dr. Kathlen Mody who obtained a full battery of tests. He found the patient to have a urinary tract infection with Escherichia coli, which may explain some of her confusion. Vitamin B 12 level was 215, which is in the low normal range. Hemoglobin was 14.7 with an MCV of 99.0. The patient was started on B12 supplementation parenterally and a chest x-ray was obtained 10/05/2013 which showed evidence of emphysema and prior granulomatous disease. To make sure an occult cancer was not being missed a CT scan of the chest abdomen and pelvis was obtained 11/19/2013. This showed a calcified granuloma in the inferior lingula. There was also a noncalcified 6 mm nodule in the lateral portion of the right lower lobe. There was no mediastinal mass or adenopathy. There were calcified granulomata in the spleen as well. There was  significant atherosclerotic calcifications. There was sigmoid diverticulosis noted incidentally. In addition, a 1.8 cm right breast mass was noted.  The breast mass was evaluated further with bilateral diagnostic mammography and right breast ultrasonography of the breast Center 11/26/2013. The breast density was category C. There was indeed an irregular mass in the right breast which was on palpation firm nontender and noted at the 10:30 position. Ultrasound confirmed a hypoechoic irregular mass measuring up to 3 cm. There was no right axillary adenopathy noted.  Biopsy of the right breast mass in question 11/26/2013 showed ([id number]) and invasive ductal carcinoma, grade 2, estrogen receptor 100% positive, progesterone receptor 59% positive, both with strong staining intensity, with an MIB-1 of 31%, and with HER-2 amplification, the signals ratio being 2.79 and the copy number per cell 5.45.  On 11-2013 the patient underwent bilateral breast MRI. This showed the enhancing mass in the upper-outer quadrant of the right breast to measure 2.1 cm. There was no other findings in the right breast, left breast, or either axilla.  The patient's subsequent history is as detailed below  INTERVAL HISTORY: Gabrielle [name] today for follow-up of her breast cancer accompanied by her daughter Gabrielle [name]. After her last visit here she had a right simple mastectomy (12/26/2013, [id number]). This showed a 3.1 cm invasive ductal carcinoma,grade 2, with ample margins.the prognostic panel was not repeated. Gabrielle [name] did well with the surgery, without unusual bleeding or other complications.  REVIEW OF SYSTEMS:  She is having significant pain and this is keeping her from doing her exercises.however she is not taking  anything for the pain. She is not having any unusual headaches, visual changes, nausea or vomiting. She tells me her appetite is good and she is eating more now that many people have brought food over. She has 2 or  3 hard bowel movements a week. She is smoking cigarettes but also about half the time using the cigarettes. A detailed review of systems today was otherwise noncontributory   PAST MEDICAL HISTORY: Past Medical History  Diagnosis Date  . Cataract   . Retina hole   . COPD (chronic obstructive pulmonary disease)   . Macular degeneration   . Anxiety   . Breast cancer of upper-outer quadrant of right female breast   . Complication of anesthesia     nausea and vomiting   . PONV (postoperative nausea and vomiting)   . Shortness of breath     increased activity   . Pneumonia     hx of     PAST SURGICAL HISTORY: Past Surgical History  Procedure Laterality Date  . Cataract extraction Bilateral   . Retinal laser procedure    . Abdominal hysterectomy  1980    (ovaries intact)  . Appendectomy    . Total mastectomy Right 12/18/2013    Procedure: RIGHT TOTAL MASTECTOMY;  Surgeon: Excell Seltzer, MD;  Location: [facility];  Service: General;  Laterality: Right;    FAMILY HISTORY Family History  Problem Relation Age of Onset  . Heart attack Mother     Deceased  . Cancer Mother     fallopian tube cancer in 54s; deceased 85  . Throat cancer Father     Deceased 49; smoker  . Prostate cancer Brother 61    Currently 49  . Other Brother     Deceased, hemorhhage of pancreas  . Leukemia Maternal Aunt   . Breast cancer Paternal Aunt     age at diagnosis unknown  . Throat cancer Paternal Uncle     Deceased 75s; smoker  . Leukemia Paternal Grandmother    the patient's father died at the age of 12 from throat cancer which has been diagnosed to years before. The patient's mother was diagnosed at age 34 with fallopian tube carcinoma. She died at age 94. There is no other history of breast or ovarian cancer in the family to the patient's knowledge  GYNECOLOGIC HISTORY:  No LMP recorded. Patient has had a hysterectomy. Menarche age 17, first live birth age 46. The patient is GX P2. She underwent  hysterectomy at a relatively young age, without salpingo-oophorectomy. She did not take hormone replacement however she took birth control pills remotely for approximately 2 years with no complications.  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 She is divorced. Her son Katiejo Gilroy lives in [place] and works as a Metallurgist. Daughter Arlissa Monteverde lives in [place] where she works as a Architect for the [employer]. Incidentally Dr. Kathe Becton is a nephew of the patient    ADVANCED DIRECTIVES: In place; the patient's son can is her healthcare power of attorney. He can be reached at [place]: History  Substance Use Topics  . Smoking status: Current Every Day Smoker -- 0.50 packs/day for 40 years    Types: Cigarettes  . Smokeless tobacco: Never Used  . Alcohol Use: 1.2 oz/week    2 Glasses of wine per week     Comment: wine a couple times weekly  Colonoscopy: Never  [name]:  Bone density: 2014  Lipid panel:  No Known Allergies  Current Outpatient Prescriptions  Medication Sig Dispense Refill  . Cholecalciferol ([employer] D3) 5000 UNITS capsule Take 5,000 Units by mouth every morning.    . Cyanocobalamin (VITAMIN B-12 PO) Take 2 drops by mouth.    Marland Kitchen HYDROcodone-acetaminophen (NORCO/VICODIN) 5-325 MG per tablet Take 1-2 tablets by mouth every 4 (four) hours as needed for moderate pain. 30 tablet 0  . Tiotropium Bromide Monohydrate (SPIRIVA RESPIMAT) 2.5 MCG/ACT AERS Inhale 2 puffs into the lungs every morning. @@ 11 am    . vitamin A 10000 UNIT capsule Take 10,000 Units by mouth every morning.      No current facility-administered medications for this visit.    OBJECTIVE: Elderly white woman who appears stated age  80 Vitals:   01/02/14 1607  BP: 128/61  Pulse: 75  Temp: 97.5 F (36.4 C)  Resp: 18     Body mass index is 18.12 kg/(m^2).    ECOG FS:1 - Symptomatic but completely  ambulatory  Ocular: Sclerae unicteric, EOMs intact Ear-nose-throat: Oropharynx clear and moist Lymphatic: No cervical or supraclavicular adenopathy Lungs no rales or rhonchi Heart regular rate and rhythm Abd soft, nontender, positive bowel sounds MSK no focal spinal tenderness, no upper extremity edema Neuro: non-focal, well-oriented, appropriate affect Breasts: The right breast is status post mastectomy. The incision is healing very nicely, with no dehiscence, swelling, or erythema. The right axilla is benign. The left breast is unremarkable   LAB RESULTS:  CMP     Component Value Date/Time   NA 142 12/26/2013 1536   NA 136* 12/19/2013 0425   K 4.1 12/26/2013 1536   K 4.7 12/19/2013 0425   CL 103 12/19/2013 0425   CO2 29 12/26/2013 1536   CO2 24 12/19/2013 0425   GLUCOSE 135 12/26/2013 1536   GLUCOSE 90 12/19/2013 0425   BUN 20.9 12/26/2013 1536   BUN 17 12/19/2013 0425   CREATININE 0.9 12/26/2013 1536   CREATININE 0.71 12/19/2013 0425   CALCIUM 9.4 12/26/2013 1536   CALCIUM 8.1* 12/19/2013 0425   PROT 6.3* 12/26/2013 1536   PROT 6.1 10/05/2013 1158   ALBUMIN 3.4* 12/26/2013 1536   ALBUMIN 3.4* 10/05/2013 1158   AST 20 12/26/2013 1536   AST 23 10/05/2013 1158   ALT 20 12/26/2013 1536   ALT 17 10/05/2013 1158   ALKPHOS 83 12/26/2013 1536   ALKPHOS 66 10/05/2013 1158   BILITOT 0.48 12/26/2013 1536   BILITOT 0.8 10/05/2013 1158   GFRNONAA 79* 12/19/2013 0425   GFRAA >90 12/19/2013 0425    I No results found for: SPEP  Lab Results  Component Value Date   WBC 6.6 12/26/2013   NEUTROABS 4.4 12/26/2013   HGB 13.4 12/26/2013   HCT 40.1 12/26/2013   MCV 99.8 12/26/2013   PLT 252 12/26/2013      Chemistry      Component Value Date/Time   NA 142 12/26/2013 1536   NA 136* 12/19/2013 0425   K 4.1 12/26/2013 1536   K 4.7 12/19/2013 0425   CL 103 12/19/2013 0425   CO2 29 12/26/2013 1536   CO2 24 12/19/2013 0425   BUN 20.9 12/26/2013 1536   BUN 17 12/19/2013  0425   CREATININE 0.9 12/26/2013 1536   CREATININE 0.71 12/19/2013 0425      Component Value Date/Time   CALCIUM 9.4 12/26/2013 1536   CALCIUM 8.1* 12/19/2013 0425   ALKPHOS 83 12/26/2013 1536  ALKPHOS 66 10/05/2013 1158   AST 20 12/26/2013 1536   AST 23 10/05/2013 1158   ALT 20 12/26/2013 1536   ALT 17 10/05/2013 1158   BILITOT 0.48 12/26/2013 1536   BILITOT 0.8 10/05/2013 1158       No results found for: LABCA2  No components found for: LABCA125  No results for input(s): INR in the last 168 hours.  Urinalysis    Component Value Date/Time   COLORURINE YELLOW 10/05/2013 1158   APPEARANCEUR Cloudy* 10/05/2013 1158   LABSPEC 1.010 10/05/2013 1158   PHURINE 8.5* 10/05/2013 1158   GLUCOSEU NEGATIVE 10/05/2013 1158   HGBUR NEGATIVE 10/05/2013 1158   BILIRUBINUR Neg 10/05/2013 1451   BILIRUBINUR NEGATIVE 10/05/2013 1158   KETONESUR NEGATIVE 10/05/2013 1158   PROTEINUR Trace 10/05/2013 1451   UROBILINOGEN 1.0 10/05/2013 1451   UROBILINOGEN 0.2 10/05/2013 1158   NITRITE Positive 10/05/2013 1451   NITRITE NEGATIVE 10/05/2013 1158   LEUKOCYTESUR Trace 10/05/2013 1451    STUDIES: No results found.  ASSESSMENT: 80 y.o. [place] woman status post right breast upper outer quadrant biopsy 11/26/2013 for a clinical T2 N0, stage IIA invasive ductal carcinoma, grade 2, with micropapillary features, triple positive, with an MIB-1 of 31%  (1) genetics testing sent 12/13/2013, results pending   (2) 6 mm right lower lobe nodule noted on CT scans 11/19/2013  (a) continuing tobacco abuse-- transitioning to e-cigarettes  (b) emphysema  (3) unexplained/ undocumented weight loss   (4) started anastrozole 01/03/2014  (5) to start trastuzumab 02/05/2014; echo 11/22/2013 shows an EF of 60-65%  PLAN: Elmo did well with her surgery and now is radiate to start her anti-HER-2 and antiestrogen therapy. We spent approximately one hour going over anti-estrogens, and the possible  toxicities, side effects and complications of anastrozole. I went ahead and put in the prescription for her (at [facility]).  We then discussed anti-HER-2 treatment. We are going to do the first 2 doses peripherally since this medication does not cause phlebitis. If she tolerates it well and the plan is to indeed continue for one year we will have a port placed.  I again encouraged Cassundra to discontinue smoking or at least moved towards easy cigarettes more and abandon the regular cigarettes. We also discussed dietary issues and the importance of eating enough calories.  Radley has a good understanding of the overall plan. She agrees with it. She knows the goal of treatment in her case is cure. She will call with any problems that may develop before her next visit here.  Chauncey Cruel, MD   01/02/2014 4:22 PM    \

## 2014-01-03 ENCOUNTER — Telehealth: Payer: Self-pay | Admitting: *Deleted

## 2014-01-03 ENCOUNTER — Telehealth: Payer: Self-pay | Admitting: Oncology

## 2014-01-03 ENCOUNTER — Encounter: Payer: Self-pay | Admitting: Genetic Counselor

## 2014-01-03 NOTE — Telephone Encounter (Signed)
Per staff message and POF I have scheduled appts. Advised scheduler of appts and to move labs. JMW  

## 2014-01-03 NOTE — Progress Notes (Signed)
GENETIC TEST RESULTS  Patient Name: ALETTA EDMUNDS Patient Age: 80 y.o. Encounter Date: 01/03/2014  Referring Physician: Lurline Del, MD   Ms. Lienhard was called today to discuss genetic test results. Please see the Genetics note from her visit on 12/13/13 for a detailed discussion of her personal and family history.  GENETIC TESTING: At the time of Ms. Arakawa's visit, we recommended she pursue genetic testing to provide useful information to her family. Testing, which included sequencing and deletion/duplication analysis of the BRCA1, BRCA2 and PALB2 genes, was performed at [facility]. Testing was normal and did not reveal a mutation in these genes.   We discussed with Ms. Twyman that since the current test is not perfect, it is possible there may be a gene mutation that current testing cannot detect, but that chance is small. We also discussed that it is possible that a different genetic factor, which was not part of this testing or has not yet been discovered, is responsible for the cancer diagnoses in the family. Again, the likelihood of this is low. Should Ms. Torre wish to discuss or pursue this additional testing, we are happy to coordinate this at any time, but do not feel that she is at significant risk of harboring a mutation in a different gene.     CANCER SCREENING: This result suggests that Ms. Prell's cancer was most likely not due to an inherited predisposition. Most cancers happen by chance and this negative test, along with details of her family history, suggests that her cancer falls into this category. We, therefore, recommended she continue to follow the cancer screening guidelines provided by her physician.   FAMILY MEMBERS: Women in the family are at some increased risk of developing cancer, over the general population risk, simply due to the family history. We recommended they have a yearly mammogram beginning at age 23, a yearly clinical breast exam, and perform monthly  breast self-exams. A gynecologic exam is recommended yearly. Colon cancer screening is recommended to begin by age 20.  Lastly, we discussed with Ms. Papin that cancer genetics is a rapidly advancing field and it is possible that new genetic tests will be appropriate for her in the future. We encouraged her to remain in contact with [place] on an annual basis so we can update her personal and family histories, and let her know of advances in cancer genetics that may benefit the family. Our contact number was provided. Ms. Connaughton questions were answered to her satisfaction today, and she knows she is welcome to call anytime with additional questions.    Steele Berg, MS, [place] Certified Genetic Counselor phone: [phone] [email or web address]

## 2014-01-03 NOTE — Telephone Encounter (Signed)
Lft msg for pt confirming labs/ov/ per 11/04 POF, sent msg to add chemo and will mail out updated sch once chemo is added.... KJ

## 2014-01-14 ENCOUNTER — Telehealth: Payer: Self-pay | Admitting: Neurology

## 2014-01-14 NOTE — Telephone Encounter (Signed)
Gabrielle [name] notified of appointment change.

## 2014-01-14 NOTE — Telephone Encounter (Signed)
Pt daughter called and needs to speak with someone about MRI please call [name] [phone]

## 2014-01-14 NOTE — Telephone Encounter (Signed)
Called Gabrielle [name] back to let her know that I have move the appointment up to November 24 at 11:55 am.  Left message for her to call me back.

## 2014-01-14 NOTE — Telephone Encounter (Signed)
[name] (patient's daughter) called and is really worried about her mom.  She is requesting for MRI to be moved up if possible.  Her mom is doing things really out of character like ordering a magazine subscription for $1950.00.  Informed her that I would see what I could do and call her back.

## 2014-01-16 ENCOUNTER — Encounter: Payer: Self-pay | Admitting: Oncology

## 2014-01-22 ENCOUNTER — Other Ambulatory Visit: Payer: Self-pay | Admitting: Nurse Practitioner

## 2014-01-22 ENCOUNTER — Ambulatory Visit
Admission: RE | Admit: 2014-01-22 | Discharge: 2014-01-22 | Disposition: A | Discharge status: Home / Self Care | Payer: Commercial Managed Care - HMO | Source: Ambulatory Visit | Attending: Neurology | Admitting: Neurology

## 2014-01-22 ENCOUNTER — Telehealth: Payer: Self-pay | Admitting: Neurology

## 2014-01-22 ENCOUNTER — Encounter: Payer: Self-pay | Admitting: Nurse Practitioner

## 2014-01-22 ENCOUNTER — Telehealth: Payer: Self-pay | Admitting: Nurse Practitioner

## 2014-01-22 ENCOUNTER — Ambulatory Visit (INDEPENDENT_AMBULATORY_CARE_PROVIDER_SITE_OTHER): Payer: Commercial Managed Care - HMO | Admitting: Nurse Practitioner

## 2014-01-22 VITALS — BP 127/78 | HR 99 | Temp 98.2°F | Resp 18 | Ht 66.0 in | Wt 109.0 lb

## 2014-01-22 DIAGNOSIS — R634 Abnormal weight loss: Secondary | ICD-10-CM

## 2014-01-22 DIAGNOSIS — J449 Chronic obstructive pulmonary disease, unspecified: Secondary | ICD-10-CM

## 2014-01-22 DIAGNOSIS — L989 Disorder of the skin and subcutaneous tissue, unspecified: Secondary | ICD-10-CM

## 2014-01-22 DIAGNOSIS — R413 Other amnesia: Secondary | ICD-10-CM

## 2014-01-22 DIAGNOSIS — E538 Deficiency of other specified B group vitamins: Secondary | ICD-10-CM

## 2014-01-22 DIAGNOSIS — F0390 Unspecified dementia without behavioral disturbance: Secondary | ICD-10-CM

## 2014-01-22 DIAGNOSIS — Z87440 Personal history of urinary (tract) infections: Secondary | ICD-10-CM

## 2014-01-22 DIAGNOSIS — N390 Urinary tract infection, site not specified: Secondary | ICD-10-CM | POA: Insufficient documentation

## 2014-01-22 DIAGNOSIS — E559 Vitamin D deficiency, unspecified: Secondary | ICD-10-CM

## 2014-01-22 LAB — POCT URINALYSIS DIPSTICK
Bilirubin, UA: NEGATIVE
GLUCOSE UA: NEGATIVE
Ketones, UA: NEGATIVE
NITRITE UA: POSITIVE
Protein, UA: NEGATIVE
SPEC GRAV UA: 1.01
UROBILINOGEN UA: 0.2
pH, UA: 6.5

## 2014-01-22 LAB — VITAMIN D 25 HYDROXY (VIT D DEFICIENCY, FRACTURES): VITD: 79.72 ng/mL (ref 30.00–100.00)

## 2014-01-22 MED ORDER — SULFAMETHOXAZOLE-TRIMETHOPRIM 800-160 MG PO TABS: 1.0000 | ORAL_TABLET | Freq: Two times a day (BID) | ORAL | Status: DC

## 2014-01-22 MED ORDER — GADOBENATE DIMEGLUMINE 529 MG/ML IV SOLN
10.0000 mL | Freq: Once | INTRAVENOUS | Status: AC | PRN
Start: 2014-01-22 — End: 2014-01-22
  Administered 2014-01-22: 10 mL via INTRAVENOUS

## 2014-01-22 NOTE — Assessment & Plan Note (Signed)
Oct level high on oral supplement. Will decrease dose but continue daily supplement-1 dropperful. Seems sharper-quicker responses.

## 2014-01-22 NOTE — Telephone Encounter (Signed)
I attempted to contact patient via phone today regarding the results of MRI brain, however there was no answer and no voicemail.  I called and left a message on her daughter's number Gabrielle [name]).  Zanyah Lentsch K. Posey Pronto, DO

## 2014-01-22 NOTE — Progress Notes (Signed)
Subjective:     Gabrielle [name] is a 80 y.o. female presenbts for follow up vitamin d deficiency, B12 deficiency, weight loss, COPD, recent UT, and skin lesion on  nose. Gabrielle [name] had R mastectomy for the treatment of triple pos breast ca since she was last seen. She is receiving anastrazole. The goal of treatment is cure, according to onc notes. Re Vit D def: talking daily supplement without SE. RE B12 def: completed 6 weeks of IM B12 followed by 6 weeks of oral supplement. Last B12 level was high on oral supplement. Re wt loss: down 3 more lbs. Etio?: COPD- does not appear in distress; Ca- No known mets; She wonders if bladder mesh she had 40 yrs ago could be causing problem. She continues to drink 1 can ensure daily. She states she is eating daily & doesn't know what else to do.  Re COPD: she is using inh daily-she cannot tell that it helps her, but she denies distress before she started using it.  Re recent UTI: no symptoms, but urine checked at initial OV as part of confusion w/u.  Re skin lesion: pt reports skin lesion for many months, maybe year or so in same place where she had "mole" removed many years ago.  The following portions of the patient's history were reviewed and updated as appropriate: allergies, current medications, past medical history, past social history, past surgical history and problem list.  Review of Systems Constitutional: positive for weight loss, negative for fatigue and fevers Respiratory: negative for cough, dyspnea on exertion, sputum and wheezing Cardiovascular: negative for chest pressure/discomfort, irregular heart beat and near-syncope Gastrointestinal: negative for abdominal pain, change in bowel habits, dyspepsia and nausea Genitourinary:positive for urinary incontinence, negative for dysuria and frequency Integument/breast: positive for surgical scar R chest post mastectomy Neurological: negative for coordination problems, gait problems, memory problems,  paresthesia and weakness  MSK: pain R axilla since mastectomy  Objective:    BP 127/78 mmHg  Pulse 99  Temp(Src) 98.2 F (36.8 C) (Oral)  Resp 18  Ht 5\' 6"  (1.676 m)  Wt 109 lb (49.442 kg)  BMI 17.60 kg/m2  SpO2 99% BP 127/78 mmHg  Pulse 99  Temp(Src) 98.2 F (36.8 C) (Oral)  Resp 18  Ht 5\' 6"  (1.676 m)  Wt 109 lb (49.442 kg)  BMI 17.60 kg/m2  SpO2 99% General appearance: alert, cooperative, appears stated age and no distress Head: Normocephalic, without obvious abnormality, atraumatic Eyes: negative findings: lids and lashes normal, conjunctivae and sclerae normal and wearing glasses Neck: no adenopathy, no carotid bruit, supple, symmetrical, trachea midline and thyroid not enlarged, symmetric, no tenderness/mass/nodules Back: moderate kyphosis Lungs: clear to auscultation bilaterally Heart: regular rate and rhythm, S1, S2 normal, no murmur, click, rub or gallop Skin: 1 cm lesion bridge of nose, telangiectasias, pink, soft, raised about 43mm. Linear surgical scar R mid chest to axilla. Clear Surgical tape over incision. No signs of infection. Lymph nodes: no cervical or [place] LAD    Assessment:   1. Vitamin D deficiency - Vit D  25 hydroxy (rtn osteoporosis monitoring)  2. Recent urinary tract infection - POCT urinalysis dipstick - Urine culture  3. Urinary tract infection without hematuria, site unspecified - sulfamethoxazole-trimethoprim (BACTRIM DS,SEPTRA DS) 800-160 MG per tablet; Take 1 tablet by mouth 2 (two) times daily.  Dispense: 6 tablet; Refill: 0  4. Skin lesion of face  5. COPD, moderate  6. Loss of weight  7. Vitamin B12 deficiency without anemia  See  patient instructions for complete plan. F/u 1 mos-uti, wt loss

## 2014-01-22 NOTE — Telephone Encounter (Signed)
Patient would like to go to [facility] or [facility].

## 2014-01-22 NOTE — Patient Instructions (Signed)
Continue B12 supplement daily: 1 dropperful.  Continue ensure (or equivalent) 1 can daily.  Continue daily inhalers.  My office will call about vitamin D results.  Bladder incontinence: start tiolet schedule every 2 hours to prevent bladder distension. I will call regarding bladder mesh.  Please see Dr Harlow Mares about skin lesion.  See you in 1 month to review toilet training.  Nice to see you! Happy Thanksgiving!  Urinary Incontinence Urinary incontinence is the involuntary loss of urine from your bladder. CAUSES  There are many causes of urinary incontinence. They include:  Medicines.  Infections.  Prostatic enlargement, leading to overflow of urine from your bladder.  Surgery.  Neurological diseases.  Emotional factors. SIGNS AND SYMPTOMS Urinary Incontinence can be divided into four types: 1. Urge incontinence. Urge incontinence is the involuntary loss of urine before you have the opportunity to go to the bathroom. There is a sudden urge to void but not enough time to reach a bathroom. 2. Stress incontinence. Stress incontinence is the sudden loss of urine with any activity that forces urine to pass. It is commonly caused by anatomical changes to the pelvis and sphincter areas of your body. 3. Overflow incontinence. Overflow incontinence is the loss of urine from an obstructed opening to your bladder. This results in a backup of urine and a resultant buildup of pressure within the bladder. When the pressure within the bladder exceeds the closing pressure of the sphincter, the urine overflows, which causes incontinence, similar to water overflowing a dam. 4. Total incontinence. Total incontinence is the loss of urine as a result of the inability to store urine within your bladder. DIAGNOSIS  Evaluating the cause of incontinence may require:  A thorough and complete medical and obstetric history.  A complete physical exam.  Laboratory tests such as a urine culture and  sensitivities. When additional tests are indicated, they can include:  An ultrasound exam.  Kidney and bladder X-rays.  Cystoscopy. This is an exam of the bladder using a narrow scope.  Urodynamic testing to test the nerve function to the bladder and sphincter areas. TREATMENT  Treatment for urinary incontinence depends on the cause:  For urge incontinence caused by a bacterial infection, antibiotics will be prescribed. If the urge incontinence is related to medicines you take, your health care provider may have you change the medicine.  For stress incontinence, surgery to re-establish anatomical support to the bladder or sphincter, or both, will often correct the condition.  For overflow incontinence caused by an enlarged prostate, an operation to open the channel through the enlarged prostate will allow the flow of urine out of the bladder. In women with fibroids, a hysterectomy may be recommended.  For total incontinence, surgery on your urinary sphincter may help. An artificial urinary sphincter (an inflatable cuff placed around the urethra) may be required. In women who have developed a hole-like passage between their bladder and vagina (vesicovaginal fistula), surgery to close the fistula often is required. HOME CARE INSTRUCTIONS  Normal daily hygiene and the use of pads or adult diapers that are changed regularly will help prevent odors and skin damage.  Avoid caffeine. It can overstimulate your bladder.  Use the bathroom regularly. Try about every 2-3 hours to go to the bathroom, even if you do not feel the need to do so. Take time to empty your bladder completely. After urinating, wait a minute. Then try to urinate again.  For causes involving nerve dysfunction, keep a log of the medicines you take  and a journal of the times you go to the bathroom. SEEK MEDICAL CARE IF:  You experience worsening of pain instead of improvement in pain after your procedure.  Your incontinence  becomes worse instead of better. SEE IMMEDIATE MEDICAL CARE IF:  You experience fever or shaking chills.  You are unable to pass your urine.  You have redness spreading into your groin or down into your thighs. MAKE SURE YOU:   Understand these instructions.   Will watch your condition.  Will get help right away if you are not doing well or get worse. Document Released: 03/25/2004 Document Revised: 12/06/2012 Document Reviewed: 07/25/2012 [facility] Patient Information 2015 [place], [place]. This information is not intended to replace advice given to you by your health care provider. Make sure you discuss any questions you have with your health care provider.

## 2014-01-22 NOTE — Assessment & Plan Note (Signed)
Lost additional 3 lbs since Aug Went through major surgery 8 wks ago Doesn't seem to be r/t COPD or to breast CA as no mets. No diarrhea. Continue ensure. F/u 3 mos.

## 2014-01-22 NOTE — Assessment & Plan Note (Signed)
Pt doesn't notice difference in breathing since started using inhalers, but never c/o significant symptoms.  Still losing weight in spite of inh use, but had major surgery in last 8 weeks. Continue with daily inhalers.

## 2014-01-22 NOTE — Assessment & Plan Note (Signed)
Looks like basal cell. Pt request referral to plastic surgery-Dr Marica Otter. Will ref for removal & biopsy.

## 2014-01-22 NOTE — Assessment & Plan Note (Signed)
Taking daily supplement w/out SE. Check level today.

## 2014-01-22 NOTE — Assessment & Plan Note (Signed)
Recurrent UTI, asymptomatic. Hx bladder mesh 40 years ago. Bactrim Will consider referral to urology.

## 2014-01-22 NOTE — Progress Notes (Signed)
Pre visit review using our clinic review tool, if applicable. No additional management support is needed unless otherwise documented below in the visit note. 

## 2014-01-23 ENCOUNTER — Telehealth: Payer: Self-pay | Admitting: Nurse Practitioner

## 2014-01-23 ENCOUNTER — Other Ambulatory Visit: Payer: Self-pay | Admitting: Nurse Practitioner

## 2014-01-23 DIAGNOSIS — L989 Disorder of the skin and subcutaneous tissue, unspecified: Secondary | ICD-10-CM

## 2014-01-23 NOTE — Telephone Encounter (Signed)
pls call pt: Advise Vit D level is therapeutic. She should cut back to 1000 iu D3 every other day or 4 days/week.

## 2014-01-23 NOTE — Telephone Encounter (Signed)
Called number on file and phone is disconnected.

## 2014-01-25 LAB — URINE CULTURE

## 2014-01-28 NOTE — Telephone Encounter (Signed)
Spoke with pt, advised lab results. Also advised medication was sent to her pharmacy for UTI. Pt understood.

## 2014-02-01 ENCOUNTER — Telehealth: Payer: Self-pay | Admitting: Neurology

## 2014-02-01 NOTE — Telephone Encounter (Signed)
[name] called, this is the patient's daughter. Has lots of concerns and questions. Please call back [phone] / Sherri S.

## 2014-02-01 NOTE — Telephone Encounter (Signed)
Left message for patient to call me back. 

## 2014-02-01 NOTE — Telephone Encounter (Signed)
Called Gabrielle [name] back and she said that her mom is rapidly declining.  She said that she has a long list of questions for Dr. Posey Pronto.  I informed her that Dr. Posey Pronto has tried to call her but could not get in touch.  I instructed her to go on My Chart and send the list of questions. She agreed.

## 2014-02-01 NOTE — Telephone Encounter (Signed)
[name] is returning your call

## 2014-02-02 ENCOUNTER — Encounter: Payer: Self-pay | Admitting: Neurology

## 2014-02-04 ENCOUNTER — Other Ambulatory Visit: Payer: Self-pay | Admitting: Dermatology

## 2014-02-04 ENCOUNTER — Other Ambulatory Visit: Payer: Self-pay | Admitting: *Deleted

## 2014-02-04 ENCOUNTER — Telehealth: Payer: Self-pay | Admitting: *Deleted

## 2014-02-04 DIAGNOSIS — C50411 Malignant neoplasm of upper-outer quadrant of right female breast: Secondary | ICD-10-CM

## 2014-02-04 NOTE — Telephone Encounter (Signed)
Pt returning a call to our office regarding email. Please call daughter, [name] back at [phone] / Gayleen Orem.

## 2014-02-04 NOTE — Telephone Encounter (Signed)
Called and left message for [name] to call me back.

## 2014-02-05 ENCOUNTER — Other Ambulatory Visit: Payer: Self-pay | Admitting: Oncology

## 2014-02-05 ENCOUNTER — Other Ambulatory Visit: Payer: Self-pay | Admitting: *Deleted

## 2014-02-05 ENCOUNTER — Ambulatory Visit (HOSPITAL_BASED_OUTPATIENT_CLINIC_OR_DEPARTMENT_OTHER): Payer: Commercial Managed Care - HMO

## 2014-02-05 ENCOUNTER — Other Ambulatory Visit (HOSPITAL_BASED_OUTPATIENT_CLINIC_OR_DEPARTMENT_OTHER): Payer: Commercial Managed Care - HMO

## 2014-02-05 ENCOUNTER — Telehealth: Payer: Self-pay | Admitting: *Deleted

## 2014-02-05 DIAGNOSIS — C50411 Malignant neoplasm of upper-outer quadrant of right female breast: Secondary | ICD-10-CM

## 2014-02-05 DIAGNOSIS — Z5112 Encounter for antineoplastic immunotherapy: Secondary | ICD-10-CM

## 2014-02-05 DIAGNOSIS — R413 Other amnesia: Secondary | ICD-10-CM

## 2014-02-05 LAB — CBC WITH DIFFERENTIAL/PLATELET
BASO%: 0.9 % (ref 0.0–2.0)
Basophils Absolute: 0.1 10*3/uL (ref 0.0–0.1)
EOS%: 2.4 % (ref 0.0–7.0)
Eosinophils Absolute: 0.2 10*3/uL (ref 0.0–0.5)
HCT: 41.8 % (ref 34.8–46.6)
HGB: 13.5 g/dL (ref 11.6–15.9)
LYMPH%: 21.5 % (ref 14.0–49.7)
MCH: 32.6 pg (ref 25.1–34.0)
MCHC: 32.2 g/dL (ref 31.5–36.0)
MCV: 101 fL (ref 79.5–101.0)
MONO#: 0.6 10*3/uL (ref 0.1–0.9)
MONO%: 9.3 % (ref 0.0–14.0)
NEUT#: 4.2 10*3/uL (ref 1.5–6.5)
NEUT%: 65.9 % (ref 38.4–76.8)
PLATELETS: 203 10*3/uL (ref 145–400)
RBC: 4.14 10*6/uL (ref 3.70–5.45)
RDW: 12.9 % (ref 11.2–14.5)
WBC: 6.4 10*3/uL (ref 3.9–10.3)
lymph#: 1.4 10*3/uL (ref 0.9–3.3)

## 2014-02-05 LAB — COMPREHENSIVE METABOLIC PANEL (CC13)
ALBUMIN: 3.5 g/dL (ref 3.5–5.0)
ALK PHOS: 73 U/L (ref 40–150)
ALT: 22 U/L (ref 0–55)
AST: 25 U/L (ref 5–34)
Anion Gap: 10 mEq/L (ref 3–11)
BILIRUBIN TOTAL: 0.6 mg/dL (ref 0.20–1.20)
BUN: 24.4 mg/dL (ref 7.0–26.0)
CO2: 27 mEq/L (ref 22–29)
Calcium: 9.3 mg/dL (ref 8.4–10.4)
Chloride: 106 mEq/L (ref 98–109)
Creatinine: 1 mg/dL (ref 0.6–1.1)
EGFR: 56 mL/min/{1.73_m2} — AB (ref >90)
Glucose: 63 mg/dl — ABNORMAL LOW (ref 70–140)
POTASSIUM: 4.1 meq/L (ref 3.5–5.1)
Sodium: 144 mEq/L (ref 136–145)
Total Protein: 6.3 g/dL — ABNORMAL LOW (ref 6.4–8.3)

## 2014-02-05 MED ORDER — DIPHENHYDRAMINE HCL 25 MG PO CAPS
25.0000 mg | ORAL_CAPSULE | Freq: Once | ORAL | Status: AC
  Administered 2014-02-05: 25 mg via ORAL

## 2014-02-05 MED ORDER — SODIUM CHLORIDE 0.9 % IV SOLN
Freq: Once | INTRAVENOUS | Status: AC
  Administered 2014-02-05: 11:00:00 via INTRAVENOUS

## 2014-02-05 MED ORDER — TRASTUZUMAB CHEMO INJECTION 440 MG
8.0000 mg/kg | Freq: Once | INTRAVENOUS | Status: AC
  Administered 2014-02-05: 399 mg via INTRAVENOUS
  Filled 2014-02-05: qty 19

## 2014-02-05 MED ORDER — ACETAMINOPHEN 325 MG PO TABS
ORAL_TABLET | ORAL | Status: AC
  Filled 2014-02-05: qty 23

## 2014-02-05 MED ORDER — ACETAMINOPHEN 325 MG PO TABS
650.0000 mg | ORAL_TABLET | Freq: Once | ORAL | Status: AC
  Administered 2014-02-05: 650 mg via ORAL

## 2014-02-05 MED ORDER — DIPHENHYDRAMINE HCL 25 MG PO CAPS
ORAL_CAPSULE | ORAL | Status: AC
  Filled 2014-02-05: qty 1

## 2014-02-05 NOTE — Telephone Encounter (Signed)
I called Gabrielle [name] back and informed her that [facility] neuropsych testing is booked out until March.  She asked if I could find somewhere near [place].  I told her that I would work on this and she said that she would call around as well.  I let her know that I will be out of the office until Friday but would call her then with an update.

## 2014-02-05 NOTE — Progress Notes (Signed)
Spoke with Dr.Magrinat. MD aware of patient's procedure yesterday. Dr. Jarome Matin removed a growth from patient's nose and RLE, near ankle.

## 2014-02-05 NOTE — Progress Notes (Unsigned)
sequencing and deletion/duplication analysis of the BRCA1, BRCA2 and PALB2 genes, was performed at [facility]. Testing was normal and did not reveal a mutation in these genes.

## 2014-02-12 ENCOUNTER — Telehealth: Payer: Self-pay | Admitting: Neurology

## 2014-02-12 ENCOUNTER — Other Ambulatory Visit: Payer: Commercial Managed Care - HMO

## 2014-02-12 NOTE — Telephone Encounter (Signed)
Pt daughter called and states she needs to talk with you about appt please call [phone]

## 2014-02-13 ENCOUNTER — Other Ambulatory Visit: Payer: Self-pay | Admitting: *Deleted

## 2014-02-13 DIAGNOSIS — R413 Other amnesia: Secondary | ICD-10-CM

## 2014-02-13 NOTE — Telephone Encounter (Signed)
Spoke with patient's daughter and she has agreed to send mom to [facility] for testing.  Referral sent.

## 2014-02-21 ENCOUNTER — Encounter: Payer: Self-pay | Admitting: Family Medicine

## 2014-02-21 ENCOUNTER — Ambulatory Visit (INDEPENDENT_AMBULATORY_CARE_PROVIDER_SITE_OTHER): Payer: Commercial Managed Care - HMO | Admitting: Family Medicine

## 2014-02-21 VITALS — BP 138/81 | HR 70 | Temp 97.6°F | Ht 66.0 in | Wt 114.0 lb

## 2014-02-21 DIAGNOSIS — T798XXA Other early complications of trauma, initial encounter: Secondary | ICD-10-CM

## 2014-02-21 MED ORDER — CEPHALEXIN 500 MG PO CAPS: 500.0000 mg | ORAL_CAPSULE | Freq: Four times a day (QID) | ORAL | Status: DC

## 2014-02-21 NOTE — Progress Notes (Signed)
OFFICE NOTE  02/21/2014  CC:  Chief Complaint  Patient presents with  . Wound Check   HPI: Patient is a 80 y.o. Caucasian female who is here for wound check on ankle. Right ankle skin lesion removed about 10d ago by dermatologist, Dr. Ronnald Ramp. Pt has been changing dressing once daily, applying vaseline and was told not to wash it. Yesterday she began to note redness, slight drainage, hurting more when she walks on right leg.  No fever and no malaise.  She is a current smoker.  She is not diabetic.   Pertinent PMH:  Past medical, surgical, social, and family history reviewed and no changes are noted since last office visit.  MEDS:  Outpatient Prescriptions Prior to Visit  Medication Sig Dispense Refill  . Cholecalciferol ([employer] D3) 5000 UNITS capsule Take 5,000 Units by mouth every other day.     . Cyanocobalamin (VITAMIN B-12 PO) Take 1 drop by mouth daily.     . fluticasone (FLONASE) 50 MCG/ACT nasal spray     . Tiotropium Bromide Monohydrate (SPIRIVA RESPIMAT) 2.5 MCG/ACT AERS Inhale 2 puffs into the lungs every morning. @@ 11 am    . vitamin A 10000 UNIT capsule Take 10,000 Units by mouth every morning.     Marland Kitchen anastrozole (ARIMIDEX) 1 MG tablet Take 1 mg by mouth daily.    Marland Kitchen HYDROcodone-acetaminophen (NORCO/VICODIN) 5-325 MG per tablet Take 1-2 tablets by mouth every 4 (four) hours as needed for moderate pain. (Patient not taking: Reported on 02/21/2014) 30 tablet 0  . sulfamethoxazole-trimethoprim (BACTRIM DS,SEPTRA DS) 800-160 MG per tablet Take 1 tablet by mouth 2 (two) times daily. (Patient not taking: Reported on 02/21/2014) 6 tablet 0   No facility-administered medications prior to visit.    PE: Blood pressure 138/81, pulse 70, temperature 97.6 F (36.4 C), temperature source Temporal, height 5\' 6"  (1.676 m), weight 114 lb (51.71 kg), SpO2 97 %. Gen: Alert, well appearing.  Patient is oriented to person, place, time, and situation. Right ankle medial aspect with 2 cm circular  surgical wound about 3 mm deep.  Moist wound bed from having had veseline on it, but no pus.  No foul odor.  Wound edges with slight scabbed appearance.   She has approx 3 cm of mild erythema surrounding the wound that has indistinct borders.  This area of erythema is tender but without induration or fluctuation.  No streaking.  IMPRESSION AND PLAN:  Mild/early wound infection/cellulitis: start keflex 500 mg qid x 10d. Continue wound care as outlined by Dr. Ronnald Ramp. Signs/symptoms to call or return for were reviewed and pt expressed understanding. I drew a pen outline around the area of erythema today.  An After Visit Summary was printed and given to the patient.  FOLLOW UP: she has f/u arranged with Nicky Pugh, FNP in 4d already.

## 2014-02-21 NOTE — Progress Notes (Signed)
Pre visit review using our clinic review tool, if applicable. No additional management support is needed unless otherwise documented below in the visit note. 

## 2014-02-25 ENCOUNTER — Ambulatory Visit (INDEPENDENT_AMBULATORY_CARE_PROVIDER_SITE_OTHER): Payer: Commercial Managed Care - HMO | Admitting: Nurse Practitioner

## 2014-02-25 ENCOUNTER — Other Ambulatory Visit: Payer: Self-pay | Admitting: *Deleted

## 2014-02-25 ENCOUNTER — Encounter: Payer: Self-pay | Admitting: Nurse Practitioner

## 2014-02-25 VITALS — BP 128/79 | HR 83 | Temp 97.4°F | Resp 16 | Ht 66.0 in | Wt 112.0 lb

## 2014-02-25 DIAGNOSIS — N390 Urinary tract infection, site not specified: Secondary | ICD-10-CM

## 2014-02-25 DIAGNOSIS — T814XXD Infection following a procedure, subsequent encounter: Secondary | ICD-10-CM

## 2014-02-25 DIAGNOSIS — IMO0001 Reserved for inherently not codable concepts without codable children: Secondary | ICD-10-CM

## 2014-02-25 DIAGNOSIS — C50411 Malignant neoplasm of upper-outer quadrant of right female breast: Secondary | ICD-10-CM

## 2014-02-25 NOTE — Patient Instructions (Signed)
Remove dressing tonight. Run water over it if it is sticking to wound. You should be able to lift it off easily. Pat dry. Apply thin layer vaseline & cover with nonadherent pad. Wrap to avoid tape on skin.  Tomorrow, begin cleansing wound twice daily with hydrogen peroxide. Once yellow exudate is gone, you may use mild soap & water (Dove bar soap). Apply thin layer vaseline & keep covered.  Continue antibiotic.   Keep follow up appointment with dr Ronnald Ramp.   My office will call with lab results.

## 2014-02-25 NOTE — Progress Notes (Signed)
Pre visit review using our clinic review tool, if applicable. No additional management support is needed unless otherwise documented below in the visit note. 

## 2014-02-26 ENCOUNTER — Other Ambulatory Visit: Payer: Self-pay | Admitting: *Deleted

## 2014-02-26 ENCOUNTER — Ambulatory Visit (HOSPITAL_BASED_OUTPATIENT_CLINIC_OR_DEPARTMENT_OTHER): Payer: Commercial Managed Care - HMO

## 2014-02-26 ENCOUNTER — Other Ambulatory Visit (HOSPITAL_BASED_OUTPATIENT_CLINIC_OR_DEPARTMENT_OTHER): Payer: Commercial Managed Care - HMO

## 2014-02-26 DIAGNOSIS — Z5112 Encounter for antineoplastic immunotherapy: Secondary | ICD-10-CM

## 2014-02-26 DIAGNOSIS — N390 Urinary tract infection, site not specified: Secondary | ICD-10-CM | POA: Insufficient documentation

## 2014-02-26 DIAGNOSIS — T8149XA Infection following a procedure, other surgical site, initial encounter: Secondary | ICD-10-CM | POA: Insufficient documentation

## 2014-02-26 DIAGNOSIS — C50411 Malignant neoplasm of upper-outer quadrant of right female breast: Secondary | ICD-10-CM

## 2014-02-26 LAB — COMPREHENSIVE METABOLIC PANEL (CC13)
ALBUMIN: 3.7 g/dL (ref 3.5–5.0)
ALK PHOS: 79 U/L (ref 40–150)
ALT: 30 U/L (ref 0–55)
AST: 29 U/L (ref 5–34)
Anion Gap: 9 mEq/L (ref 3–11)
BUN: 17 mg/dL (ref 7.0–26.0)
CO2: 27 mEq/L (ref 22–29)
Calcium: 9.3 mg/dL (ref 8.4–10.4)
Chloride: 103 mEq/L (ref 98–109)
Creatinine: 0.8 mg/dL (ref 0.6–1.1)
EGFR: 71 mL/min/{1.73_m2} — ABNORMAL LOW (ref >90)
GLUCOSE: 90 mg/dL (ref 70–140)
POTASSIUM: 4 meq/L (ref 3.5–5.1)
SODIUM: 138 meq/L (ref 136–145)
TOTAL PROTEIN: 6.7 g/dL (ref 6.4–8.3)
Total Bilirubin: 0.49 mg/dL (ref 0.20–1.20)

## 2014-02-26 LAB — CBC WITH DIFFERENTIAL/PLATELET
BASO%: 1 % (ref 0.0–2.0)
Basophils Absolute: 0.1 10*3/uL (ref 0.0–0.1)
EOS%: 1 % (ref 0.0–7.0)
Eosinophils Absolute: 0.1 10*3/uL (ref 0.0–0.5)
HCT: 41.2 % (ref 34.8–46.6)
HEMOGLOBIN: 13.8 g/dL (ref 11.6–15.9)
LYMPH%: 31.2 % (ref 14.0–49.7)
MCH: 33.4 pg (ref 25.1–34.0)
MCHC: 33.5 g/dL (ref 31.5–36.0)
MCV: 99.8 fL (ref 79.5–101.0)
MONO#: 0.8 10*3/uL (ref 0.1–0.9)
MONO%: 13.6 % (ref 0.0–14.0)
NEUT%: 53.2 % (ref 38.4–76.8)
NEUTROS ABS: 3.1 10*3/uL (ref 1.5–6.5)
PLATELETS: 204 10*3/uL (ref 145–400)
RBC: 4.13 10*6/uL (ref 3.70–5.45)
RDW: 13.1 % (ref 11.2–14.5)
WBC: 5.9 10*3/uL (ref 3.9–10.3)
lymph#: 1.8 10*3/uL (ref 0.9–3.3)

## 2014-02-26 MED ORDER — TRASTUZUMAB CHEMO INJECTION 440 MG
6.0000 mg/kg | Freq: Once | INTRAVENOUS | Status: AC
  Administered 2014-02-26: 315 mg via INTRAVENOUS
  Filled 2014-02-26: qty 15

## 2014-02-26 MED ORDER — ACETAMINOPHEN 325 MG PO TABS
ORAL_TABLET | ORAL | Status: AC
  Filled 2014-02-26: qty 2

## 2014-02-26 MED ORDER — ACETAMINOPHEN 325 MG PO TABS
650.0000 mg | ORAL_TABLET | Freq: Once | ORAL | Status: AC
  Administered 2014-02-26: 650 mg via ORAL

## 2014-02-26 MED ORDER — DIPHENHYDRAMINE HCL 25 MG PO CAPS
ORAL_CAPSULE | ORAL | Status: AC
  Filled 2014-02-26: qty 1

## 2014-02-26 MED ORDER — SODIUM CHLORIDE 0.9 % IV SOLN
Freq: Once | INTRAVENOUS | Status: AC
  Administered 2014-02-26: 15:00:00 via INTRAVENOUS

## 2014-02-26 MED ORDER — DIPHENHYDRAMINE HCL 25 MG PO CAPS
25.0000 mg | ORAL_CAPSULE | Freq: Once | ORAL | Status: AC
  Administered 2014-02-26: 25 mg via ORAL

## 2014-02-26 NOTE — Patient Instructions (Signed)
[facility] Discharge Instructions for Patients Receiving Chemotherapy  Today you received the following chemotherapy agents Herceptin  To help prevent nausea and vomiting after your treatment, we encourage you to take your nausea medication     If you develop nausea and vomiting that is not controlled by your nausea medication, call the clinic.   BELOW ARE SYMPTOMS THAT SHOULD BE REPORTED IMMEDIATELY:  *FEVER GREATER THAN 100.5 F  *CHILLS WITH OR WITHOUT FEVER  NAUSEA AND VOMITING THAT IS NOT CONTROLLED WITH YOUR NAUSEA MEDICATION  *UNUSUAL SHORTNESS OF BREATH  *UNUSUAL BRUISING OR BLEEDING  TENDERNESS IN MOUTH AND THROAT WITH OR WITHOUT PRESENCE OF ULCERS  *URINARY PROBLEMS  *BOWEL PROBLEMS  UNUSUAL RASH Items with * indicate a potential emergency and should be followed up as soon as possible.  Feel free to call the clinic you have any questions or concerns. The clinic phone number is [phone].    

## 2014-02-26 NOTE — Progress Notes (Signed)
Subjective:     Gabrielle [name] is a 80 y.o. female presents for follow up of wound check and recurrent UTI. Wound: Gabrielle [name] had excisional biopsy of lesion in R lower leg about 10 days ago. She presented 4 da w/erythema around wound. She was started on cephalexin. Today she states wound is less red, but still has yellow pus. Wound is tender to touch & mild pain w/weight bearing. She has not been cleaning wound, per derm instructions-only applying vaseline & clean bandage daily. Recurrent UTI: pt only presents w/mild confusion when she has UTI. Her family first encouraged her to seek medical care when they noticed slower mentation. Pt admitted to getting confused w/directions when driving. She has been treated twice for UTI w/cipro & bactrim (bactrim showed sensitivty on urine Cx). Today she does not show any signs of slowed mentation and reports no symptoms of UTI such as dysuria, frequency, hesitancy. She has stress incontinence and Hx of bladder tack 20 yrs ago. She does not wish to consult w/urology at this time.  The following portions of the patient's history were reviewed and updated as appropriate: allergies, current medications, past medical history, past social history, past surgical history and problem list.  Review of Systems Constitutional: negative for chills, fatigue, fevers and sweats Gastrointestinal: negative for abdominal pain, change in bowel habits, diarrhea and nausea Genitourinary:negative for hematuria Integument/breast: positive for also has excisional biopsy site at bridge of nose. Musculoskeletal:negative for back pain    Objective:    BP 128/79 mmHg  Pulse 83  Temp(Src) 97.4 F (36.3 C) (Oral)  Resp 16  Ht 5\' 6"  (1.676 m)  Wt 112 lb (50.803 kg)  BMI 18.09 kg/m2  SpO2 98% BP 128/79 mmHg  Pulse 83  Temp(Src) 97.4 F (36.3 C) (Oral)  Resp 16  Ht 5\' 6"  (1.676 m)  Wt 112 lb (50.803 kg)  BMI 18.09 kg/m2  SpO2 98% General appearance: alert, cooperative, appears  stated age and no distress Head: Normocephalic, without obvious abnormality, atraumatic Eyes: negative findings: lids and lashes normal and conjunctivae and sclerae normal Extremities: edema none Pulses: 2+ and symmetric Skin: R LE: wound just over 1 cm. bed has moderate yellow exudate. depth about 3-4 mm. less erythema than 4 days ago surrounding wound based on line drawn around wound. See skin care. Neurologic: Grossly normal    Wound care: removed non-adherent bandage-yellow exudate on bandage. Wound cleansed w/H2O2 & clean gauze using gentle friction to remove exudate. Irrigated w/saline. Scant yellow exudate remains in wound. Edges pink, mild erythema surrounding wound about 2" wide. Assessment:Plan  1. Recurrent UTI asymptomatic - Urine culture Treatment PRN lab. May need prophylactic ABX if culture pos  2. Postoperative wound infection, subsequent encounter Wound care instructions demonstrated & given Finish ABX Keep F/u appt. w/derm in 10 days

## 2014-02-27 ENCOUNTER — Telehealth: Payer: Self-pay | Admitting: Nurse Practitioner

## 2014-02-27 LAB — URINE CULTURE
COLONY COUNT: NO GROWTH
Organism ID, Bacteria: NO GROWTH

## 2014-02-27 NOTE — Telephone Encounter (Signed)
Left detailed message on pt's home vm. Per pt's DPR.

## 2014-02-27 NOTE — Telephone Encounter (Signed)
pls call pt: Advise Urine Cx indicates infection has cleared.

## 2014-03-05 DIAGNOSIS — R413 Other amnesia: Secondary | ICD-10-CM | POA: Diagnosis not present

## 2014-03-12 ENCOUNTER — Ambulatory Visit (HOSPITAL_BASED_OUTPATIENT_CLINIC_OR_DEPARTMENT_OTHER): Payer: Commercial Managed Care - HMO | Admitting: Oncology

## 2014-03-12 ENCOUNTER — Telehealth: Payer: Self-pay | Admitting: Oncology

## 2014-03-12 VITALS — BP 136/54 | HR 82 | Temp 97.6°F | Resp 18 | Ht 66.0 in | Wt 113.1 lb

## 2014-03-12 DIAGNOSIS — R634 Abnormal weight loss: Secondary | ICD-10-CM

## 2014-03-12 DIAGNOSIS — R911 Solitary pulmonary nodule: Secondary | ICD-10-CM | POA: Diagnosis not present

## 2014-03-12 DIAGNOSIS — J438 Other emphysema: Secondary | ICD-10-CM

## 2014-03-12 DIAGNOSIS — Z170 Estrogen receptor positive status [ER+]: Secondary | ICD-10-CM

## 2014-03-12 DIAGNOSIS — C50919 Malignant neoplasm of unspecified site of unspecified female breast: Secondary | ICD-10-CM

## 2014-03-12 DIAGNOSIS — Z720 Tobacco use: Secondary | ICD-10-CM | POA: Diagnosis not present

## 2014-03-12 DIAGNOSIS — C50411 Malignant neoplasm of upper-outer quadrant of right female breast: Secondary | ICD-10-CM

## 2014-03-12 DIAGNOSIS — J439 Emphysema, unspecified: Secondary | ICD-10-CM

## 2014-03-12 DIAGNOSIS — E538 Deficiency of other specified B group vitamins: Secondary | ICD-10-CM

## 2014-03-12 DIAGNOSIS — F17210 Nicotine dependence, cigarettes, uncomplicated: Secondary | ICD-10-CM

## 2014-03-12 NOTE — Telephone Encounter (Signed)
per pof to sch pt appt-sent MW email to sch trmt-sent Vaughan Basta email to precert for [place] to get updated sch 03/19/14

## 2014-03-12 NOTE — Progress Notes (Signed)
Gabrielle [name]  [phone] [phone]     ID: Gabrielle [name] DOB: 29-Apr-1930  MR#: [id number]  DHR#:[id number]  Patient Care Team: Irene Pap, NP as PCP - General (Nurse Practitioner) Excell Seltzer, MD as Consulting Physician (General Surgery) Chauncey Cruel, MD as Consulting Physician (Oncology) Rexene Edison, MD as Consulting Physician (Radiation Oncology) OTHER MD:  CHIEF COMPLAINT: Triple positive breast cancer  CURRENT TREATMENT:  Anastrozole, ttrasztuzumab   BREAST CANCER HISTORY: From the original intake note:  Gabrielle [name] had not been to a doctor for "more than 20 years. Since Christmas 2014 and the family has become increasingly concerned that she appeared to be losing weight and was a bit more confused. They asked the patient how much weight she had lost and she said she had lost "about 50 pounds.". However, the only weight we have available before August of this year is from August of 2014 and it was 115 pounds at that time.  Nevertheless with this concern the patient agreed to be seen by a physician and on 10/06/2013 she was seen by Dr. Kathlen Mody who obtained a full battery of tests. He found the patient to have a urinary tract infection with Escherichia coli, which may explain some of her confusion. Vitamin B 12 level was 215, which is in the low normal range. Hemoglobin was 14.7 with an MCV of 99.0. The patient was started on B12 supplementation parenterally and a chest x-ray was obtained 10/05/2013 which showed evidence of emphysema and prior granulomatous disease. To make sure an occult cancer was not being missed a CT scan of the chest abdomen and pelvis was obtained 11/19/2013. This showed a calcified granuloma in the inferior lingula. There was also a noncalcified 6 mm nodule in the lateral portion of the right lower lobe. There was no mediastinal mass or adenopathy. There were calcified granulomata in the spleen as well. There was  significant atherosclerotic calcifications. There was sigmoid diverticulosis noted incidentally. In addition, a 1.8 cm right breast mass was noted.  The breast mass was evaluated further with bilateral diagnostic mammography and right breast ultrasonography of the breast Center 11/26/2013. The breast density was category C. There was indeed an irregular mass in the right breast which was on palpation firm nontender and noted at the 10:30 position. Ultrasound confirmed a hypoechoic irregular mass measuring up to 3 cm. There was no right axillary adenopathy noted.  Biopsy of the right breast mass in question 11/26/2013 showed ([id number]) and invasive ductal carcinoma, grade 2, estrogen receptor 100% positive, progesterone receptor 59% positive, both with strong staining intensity, with an MIB-1 of 31%, and with HER-2 amplification, the signals ratio being 2.79 and the copy number per cell 5.45.  On 11-2013 the patient underwent bilateral breast MRI. This showed the enhancing mass in the upper-outer quadrant of the right breast to measure 2.1 cm. There was no other findings in the right breast, left breast, or either axilla.  The patient's subsequent history is as detailed below  INTERVAL HISTORY: [place] today for follow-up of her breast cancer. She continues on trastuzumab every 3 weeks, with good tolerance. She was supposed to have had an echo but "is not sure what happened", and that was not done. She is also on anastrozole. She denies problems with hot flashes or vaginal dryness. However she did not recognize the name of the drug. She was not able to tell me how much it cost except that "it wasn't expensive".  REVIEW OF SYSTEMS: She had Mohs surgery under Dr. Ronnald Ramp for a place in her nose and also place on her leg. She enjoyed the holidays, when she visited her family in [place]. She is receiving the trastuzumab through peripheral veins, since she did not want a port, and that is going  well. Unfortunately she continues to smoke. She tells me she is using more E cigarettes now than regular cigarettes. She tells me she has no problem with eating and no problem with her appetite. She had dropped to 109 pounds as of November, but is currently 113. Some of that of course could be winter close. They have been no change in bowel or bladder habits, and she denies worsening cough, phlegm production, pleurisy, or shortness of breath. A detailed review of systems today was otherwise stable  PAST MEDICAL HISTORY: Past Medical History  Diagnosis Date  . Cataract   . Retina hole   . COPD (chronic obstructive pulmonary disease)   . Macular degeneration   . Anxiety   . Breast cancer of upper-outer quadrant of right female breast   . Complication of anesthesia     nausea and vomiting   . PONV (postoperative nausea and vomiting)   . Shortness of breath     increased activity   . Pneumonia     hx of     PAST SURGICAL HISTORY: Past Surgical History  Procedure Laterality Date  . Cataract extraction Bilateral   . Retinal laser procedure    . Abdominal hysterectomy  1980    (ovaries intact)  . Appendectomy    . Total mastectomy Right 12/18/2013    Procedure: RIGHT TOTAL MASTECTOMY;  Surgeon: Excell Seltzer, MD;  Location: [facility];  Service: General;  Laterality: Right;    FAMILY HISTORY Family History  Problem Relation Age of Onset  . Heart attack Mother     Deceased  . Cancer Mother     fallopian tube cancer in 83s; deceased 41  . Throat cancer Father     Deceased 37; smoker  . Prostate cancer Brother 18    Currently 41  . Other Brother     Deceased, hemorhhage of pancreas  . Leukemia Maternal Aunt   . Breast cancer Paternal Aunt     age at diagnosis unknown  . Throat cancer Paternal Uncle     Deceased 65s; smoker  . Leukemia Paternal Grandmother    the patient's father died at the age of 9 from throat cancer which has been diagnosed to years before. The patient's  mother was diagnosed at age 27 with fallopian tube carcinoma. She died at age 67. There is no other history of breast or ovarian cancer in the family to the patient's knowledge  GYNECOLOGIC HISTORY:  No LMP recorded. Patient has had a hysterectomy. Menarche age 31, first live birth age 50. The patient is GX P2. She underwent hysterectomy at a relatively young age, without salpingo-oophorectomy. She did not take hormone replacement however she took birth control pills remotely for approximately 2 years with no complications.  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 She is divorced. Her son Gabrielle [name] lives in [place] and works as a Metallurgist. Daughter Gabrielle [name] lives in [place] where she works as a Architect for the [employer]. Incidentally Dr. Kathe Becton is a nephew of the patient    ADVANCED DIRECTIVES: In place; the patient's son can is her healthcare  power of attorney. He can be reached at [place]: History  Substance Use Topics  . Smoking status: Current Every Day Smoker -- 0.50 packs/day for 40 years    Types: Cigarettes  . Smokeless tobacco: Never Used  . Alcohol Use: 1.2 oz/week    2 Glasses of wine per week     Comment: wine a couple times weekly     Colonoscopy: Never  PAP:  Bone density: 2014  Lipid panel:  No Known Allergies  Current Outpatient Prescriptions  Medication Sig Dispense Refill  . cephALEXin (KEFLEX) 500 MG capsule Take 1 capsule (500 mg total) by mouth 4 (four) times daily. 40 capsule 0  . Cholecalciferol (CVS D3) 5000 UNITS capsule Take 5,000 Units by mouth every other day.     . Cyanocobalamin (VITAMIN B-12 PO) Take 1 drop by mouth daily.     . fluticasone (FLONASE) 50 MCG/ACT nasal spray     . HYDROcodone-acetaminophen (NORCO/VICODIN) 5-325 MG per tablet     . Tiotropium Bromide Monohydrate (SPIRIVA RESPIMAT) 2.5 MCG/ACT AERS Inhale 2 puffs  into the lungs every morning. @@ 11 am    . vitamin A 10000 UNIT capsule Take 10,000 Units by mouth every morning.      No current facility-administered medications for this visit.    OBJECTIVE: Elderly white woman in no acute distress Filed Vitals:   03/12/14 1555  BP: 136/54  Pulse: 82  Temp: 97.6 F (36.4 C)  Resp: 18     Body mass index is 18.26 kg/(m^2).    ECOG FS:1 - Symptomatic but completely ambulatory  Sclerae unicteric, pupils round and equal Oropharynx clear and moist-- no thrush or other lesions No cervical or supraclavicular adenopathy Lungs no rales or rhonchi, no wheezes Heart regular rate and rhythm Abd soft, nontender, positive bowel sounds MSK no focal spinal tenderness, no upper extremity lymphedema Neuro: nonfocal, well oriented, appropriate affect Breasts:     LAB RESULTS:  CMP     Component Value Date/Time   NA 138 02/26/2014 1411   NA 136* 12/19/2013 0425   K 4.0 02/26/2014 1411   K 4.7 12/19/2013 0425   CL 103 12/19/2013 0425   CO2 27 02/26/2014 1411   CO2 24 12/19/2013 0425   GLUCOSE 90 02/26/2014 1411   GLUCOSE 90 12/19/2013 0425   BUN 17.0 02/26/2014 1411   BUN 17 12/19/2013 0425   CREATININE 0.8 02/26/2014 1411   CREATININE 0.71 12/19/2013 0425   CALCIUM 9.3 02/26/2014 1411   CALCIUM 8.1* 12/19/2013 0425   PROT 6.7 02/26/2014 1411   PROT 6.1 10/05/2013 1158   ALBUMIN 3.7 02/26/2014 1411   ALBUMIN 3.4* 10/05/2013 1158   AST 29 02/26/2014 1411   AST 23 10/05/2013 1158   ALT 30 02/26/2014 1411   ALT 17 10/05/2013 1158   ALKPHOS 79 02/26/2014 1411   ALKPHOS 66 10/05/2013 1158   BILITOT 0.49 02/26/2014 1411   BILITOT 0.8 10/05/2013 1158   GFRNONAA 79* 12/19/2013 0425   GFRAA >90 12/19/2013 0425    I No results found for: SPEP  Lab Results  Component Value Date   WBC 5.9 02/26/2014   NEUTROABS 3.1 02/26/2014   HGB 13.8 02/26/2014   HCT 41.2 02/26/2014   MCV 99.8 02/26/2014   PLT 204 02/26/2014      Chemistry        Component Value Date/Time   NA 138 02/26/2014 1411   NA 136* 12/19/2013 0425   K 4.0 02/26/2014 1411  K 4.7 12/19/2013 0425   CL 103 12/19/2013 0425   CO2 27 02/26/2014 1411   CO2 24 12/19/2013 0425   BUN 17.0 02/26/2014 1411   BUN 17 12/19/2013 0425   CREATININE 0.8 02/26/2014 1411   CREATININE 0.71 12/19/2013 0425      Component Value Date/Time   CALCIUM 9.3 02/26/2014 1411   CALCIUM 8.1* 12/19/2013 0425   ALKPHOS 79 02/26/2014 1411   ALKPHOS 66 10/05/2013 1158   AST 29 02/26/2014 1411   AST 23 10/05/2013 1158   ALT 30 02/26/2014 1411   ALT 17 10/05/2013 1158   BILITOT 0.49 02/26/2014 1411   BILITOT 0.8 10/05/2013 1158       No results found for: LABCA2  No components found for: LABCA125  No results for input(s): INR in the last 168 hours.  Urinalysis    Component Value Date/Time   COLORURINE YELLOW 10/05/2013 1158   APPEARANCEUR Cloudy* 10/05/2013 1158   LABSPEC 1.010 10/05/2013 1158   PHURINE 8.5* 10/05/2013 1158   GLUCOSEU NEGATIVE 10/05/2013 1158   HGBUR NEGATIVE 10/05/2013 1158   BILIRUBINUR neg 01/22/2014 1203   BILIRUBINUR NEGATIVE 10/05/2013 1158   KETONESUR NEGATIVE 10/05/2013 1158   PROTEINUR neg 01/22/2014 1203   UROBILINOGEN 0.2 01/22/2014 1203   UROBILINOGEN 0.2 10/05/2013 1158   NITRITE positive 01/22/2014 1203   NITRITE NEGATIVE 10/05/2013 1158   LEUKOCYTESUR small (1+) 01/22/2014 1203    STUDIES: No results found.  ASSESSMENT: 81 y.o. BRCA negative [place] woman status post right breast upper outer quadrant biopsy 11/26/2013 for a clinical T2 N0, stage IIA invasive ductal carcinoma, grade 2, with micropapillary features, triple positive, with an MIB-1 of 31%  (1) right simple mastectomy 12/18/2013 showed a pT2 pNX, stage II invasive ductal carcinoma, grade 2, with negative margins.  (2) 6 mm right lower lobe nodule noted on CT scans 11/19/2013  (a) continuing tobacco abuse-- transitioning to e-cigarettes  (b) emphysema  (3)  unexplained/ undocumented weight loss   (4) started anastrozole 01/03/2014  (5) started trastuzumab 02/05/2014; to be continued for one year  (a) echo 11/22/2013 shows an EF of 60-65%  PLAN: Gabrielle [name] is tolerating the trastuzumab without any side effects that she is aware of. She is a little overdue for her echo and I have put that in for this coming week. She would like to go with a port and I have sent a note to Dr. Excell Seltzer asking them to schedule that at his and her convenience. Her next treatment of courses 03/19/2014 and I don't think it will be possible to get her port before then.  She is tolerating the anastrozole well. It concerns me that she did not recognize the name and did not know the cost. It would be helpful if she brought her medications with her when she came so we could review them with her.  I again encouraged her to discontinue smoking.  We also discussed her weight. It would be wonderful if she could pick up 5 or 10 pounds. She says she likes to eat meat and potatoes and that certainly should do it. I encouraged her to try some snacks. If she has not managed to gain any weight before the next visit we will consider adding Megace. I don't think that I would add Marinol in her case because of concerns regarding confusion  Gabrielle [name] has a good understanding of the overall plan. She agrees with it. She knows the goal of treatment in her case is cure. She will call  with any problems that may develop before her next visit here which will be around the time of her next echocardiogram in April.Marland Kitchen  Chauncey Cruel, MD   03/12/2014 4:13 PM    \

## 2014-03-13 DIAGNOSIS — C44311 Basal cell carcinoma of skin of nose: Secondary | ICD-10-CM | POA: Diagnosis not present

## 2014-03-13 DIAGNOSIS — Z85828 Personal history of other malignant neoplasm of skin: Secondary | ICD-10-CM | POA: Diagnosis not present

## 2014-03-17 ENCOUNTER — Telehealth: Payer: Self-pay | Admitting: Oncology

## 2014-03-17 NOTE — Telephone Encounter (Signed)
central scheduling will contact pt re ct appt - no other orders per 1/16 pof.

## 2014-03-19 ENCOUNTER — Ambulatory Visit (HOSPITAL_BASED_OUTPATIENT_CLINIC_OR_DEPARTMENT_OTHER): Payer: Commercial Managed Care - HMO

## 2014-03-19 ENCOUNTER — Other Ambulatory Visit: Payer: Self-pay | Admitting: Hematology and Oncology

## 2014-03-19 ENCOUNTER — Ambulatory Visit (HOSPITAL_BASED_OUTPATIENT_CLINIC_OR_DEPARTMENT_OTHER): Payer: Commercial Managed Care - HMO | Admitting: Hematology and Oncology

## 2014-03-19 DIAGNOSIS — Z5112 Encounter for antineoplastic immunotherapy: Secondary | ICD-10-CM | POA: Diagnosis not present

## 2014-03-19 DIAGNOSIS — C50411 Malignant neoplasm of upper-outer quadrant of right female breast: Secondary | ICD-10-CM | POA: Diagnosis not present

## 2014-03-19 DIAGNOSIS — F17210 Nicotine dependence, cigarettes, uncomplicated: Secondary | ICD-10-CM

## 2014-03-19 DIAGNOSIS — C50919 Malignant neoplasm of unspecified site of unspecified female breast: Secondary | ICD-10-CM

## 2014-03-19 LAB — CBC WITH DIFFERENTIAL/PLATELET
BASO%: 0.9 % (ref 0.0–2.0)
BASOS ABS: 0.1 10*3/uL (ref 0.0–0.1)
EOS%: 1.9 % (ref 0.0–7.0)
Eosinophils Absolute: 0.1 10*3/uL (ref 0.0–0.5)
HEMATOCRIT: 43.4 % (ref 34.8–46.6)
HEMOGLOBIN: 13.9 g/dL (ref 11.6–15.9)
LYMPH%: 32.4 % (ref 14.0–49.7)
MCH: 32.3 pg (ref 25.1–34.0)
MCHC: 32 g/dL (ref 31.5–36.0)
MCV: 101 fL (ref 79.5–101.0)
MONO#: 0.4 10*3/uL (ref 0.1–0.9)
MONO%: 7 % (ref 0.0–14.0)
NEUT%: 57.8 % (ref 38.4–76.8)
NEUTROS ABS: 3.6 10*3/uL (ref 1.5–6.5)
Platelets: 227 10*3/uL (ref 145–400)
RBC: 4.29 10*6/uL (ref 3.70–5.45)
RDW: 13.2 % (ref 11.2–14.5)
WBC: 6.3 10*3/uL (ref 3.9–10.3)
lymph#: 2 10*3/uL (ref 0.9–3.3)

## 2014-03-19 LAB — COMPREHENSIVE METABOLIC PANEL (CC13)
ALBUMIN: 3.6 g/dL (ref 3.5–5.0)
ALT: 31 U/L (ref 0–55)
ANION GAP: 11 meq/L (ref 3–11)
AST: 27 U/L (ref 5–34)
Alkaline Phosphatase: 87 U/L (ref 40–150)
BUN: 25.2 mg/dL (ref 7.0–26.0)
CO2: 28 mEq/L (ref 22–29)
Calcium: 9 mg/dL (ref 8.4–10.4)
Chloride: 104 mEq/L (ref 98–109)
Creatinine: 0.8 mg/dL (ref 0.6–1.1)
EGFR: 70 mL/min/{1.73_m2} — AB (ref >90)
Glucose: 116 mg/dl (ref 70–140)
POTASSIUM: 3.7 meq/L (ref 3.5–5.1)
Sodium: 142 mEq/L (ref 136–145)
Total Bilirubin: 1 mg/dL (ref 0.20–1.20)
Total Protein: 6.6 g/dL (ref 6.4–8.3)

## 2014-03-19 MED ORDER — ACETAMINOPHEN 325 MG PO TABS
650.0000 mg | ORAL_TABLET | Freq: Once | ORAL | Status: AC
  Administered 2014-03-19: 650 mg via ORAL

## 2014-03-19 MED ORDER — SODIUM CHLORIDE 0.9 % IV SOLN
Freq: Once | INTRAVENOUS | Status: AC
  Administered 2014-03-19: 15:00:00 via INTRAVENOUS

## 2014-03-19 MED ORDER — ACETAMINOPHEN 325 MG PO TABS
ORAL_TABLET | ORAL | Status: AC
  Filled 2014-03-19: qty 2

## 2014-03-19 MED ORDER — DIPHENHYDRAMINE HCL 25 MG PO CAPS
25.0000 mg | ORAL_CAPSULE | Freq: Once | ORAL | Status: AC
  Administered 2014-03-19: 25 mg via ORAL

## 2014-03-19 MED ORDER — DIPHENHYDRAMINE HCL 25 MG PO CAPS
ORAL_CAPSULE | ORAL | Status: AC
  Filled 2014-03-19: qty 2

## 2014-03-19 MED ORDER — DIPHENHYDRAMINE HCL 25 MG PO CAPS
ORAL_CAPSULE | ORAL | Status: AC
  Filled 2014-03-19: qty 1

## 2014-03-19 MED ORDER — SODIUM CHLORIDE 0.9 % IV SOLN
6.0000 mg/kg | Freq: Once | INTRAVENOUS | Status: AC
  Administered 2014-03-19: 315 mg via INTRAVENOUS
  Filled 2014-03-19: qty 15

## 2014-04-09 ENCOUNTER — Telehealth: Payer: Self-pay

## 2014-04-09 ENCOUNTER — Ambulatory Visit (HOSPITAL_BASED_OUTPATIENT_CLINIC_OR_DEPARTMENT_OTHER): Payer: Commercial Managed Care - HMO

## 2014-04-09 ENCOUNTER — Other Ambulatory Visit (HOSPITAL_BASED_OUTPATIENT_CLINIC_OR_DEPARTMENT_OTHER): Payer: Commercial Managed Care - HMO

## 2014-04-09 DIAGNOSIS — C50411 Malignant neoplasm of upper-outer quadrant of right female breast: Secondary | ICD-10-CM

## 2014-04-09 DIAGNOSIS — F17210 Nicotine dependence, cigarettes, uncomplicated: Secondary | ICD-10-CM

## 2014-04-09 DIAGNOSIS — Z5112 Encounter for antineoplastic immunotherapy: Secondary | ICD-10-CM | POA: Diagnosis not present

## 2014-04-09 DIAGNOSIS — C50919 Malignant neoplasm of unspecified site of unspecified female breast: Secondary | ICD-10-CM

## 2014-04-09 LAB — CBC WITH DIFFERENTIAL/PLATELET
BASO%: 1.1 % (ref 0.0–2.0)
BASOS ABS: 0.1 10*3/uL (ref 0.0–0.1)
EOS%: 2.2 % (ref 0.0–7.0)
Eosinophils Absolute: 0.1 10*3/uL (ref 0.0–0.5)
HEMATOCRIT: 41.8 % (ref 34.8–46.6)
HEMOGLOBIN: 13.5 g/dL (ref 11.6–15.9)
LYMPH%: 32.7 % (ref 14.0–49.7)
MCH: 32.5 pg (ref 25.1–34.0)
MCHC: 32.2 g/dL (ref 31.5–36.0)
MCV: 101.1 fL — ABNORMAL HIGH (ref 79.5–101.0)
MONO#: 0.5 10*3/uL (ref 0.1–0.9)
MONO%: 8.7 % (ref 0.0–14.0)
NEUT#: 3.4 10*3/uL (ref 1.5–6.5)
NEUT%: 55.3 % (ref 38.4–76.8)
PLATELETS: 211 10*3/uL (ref 145–400)
RBC: 4.13 10*6/uL (ref 3.70–5.45)
RDW: 13 % (ref 11.2–14.5)
WBC: 6.1 10*3/uL (ref 3.9–10.3)
lymph#: 2 10*3/uL (ref 0.9–3.3)

## 2014-04-09 LAB — COMPREHENSIVE METABOLIC PANEL (CC13)
ALK PHOS: 91 U/L (ref 40–150)
ALT: 21 U/L (ref 0–55)
AST: 22 U/L (ref 5–34)
Albumin: 3.7 g/dL (ref 3.5–5.0)
Anion Gap: 12 mEq/L — ABNORMAL HIGH (ref 3–11)
BILIRUBIN TOTAL: 0.37 mg/dL (ref 0.20–1.20)
BUN: 18.8 mg/dL (ref 7.0–26.0)
CO2: 26 mEq/L (ref 22–29)
Calcium: 9.3 mg/dL (ref 8.4–10.4)
Chloride: 103 mEq/L (ref 98–109)
Creatinine: 0.8 mg/dL (ref 0.6–1.1)
EGFR: 65 mL/min/{1.73_m2} — ABNORMAL LOW (ref >90)
Glucose: 134 mg/dl (ref 70–140)
Potassium: 4.3 mEq/L (ref 3.5–5.1)
SODIUM: 140 meq/L (ref 136–145)
Total Protein: 6.6 g/dL (ref 6.4–8.3)

## 2014-04-09 MED ORDER — SODIUM CHLORIDE 0.9 % IV SOLN
Freq: Once | INTRAVENOUS | Status: AC
  Administered 2014-04-09: 15:00:00 via INTRAVENOUS

## 2014-04-09 MED ORDER — ACETAMINOPHEN 325 MG PO TABS
ORAL_TABLET | ORAL | Status: AC
  Filled 2014-04-09: qty 2

## 2014-04-09 MED ORDER — ACETAMINOPHEN 325 MG PO TABS
650.0000 mg | ORAL_TABLET | Freq: Once | ORAL | Status: AC
  Administered 2014-04-09: 650 mg via ORAL

## 2014-04-09 MED ORDER — TRASTUZUMAB CHEMO INJECTION 440 MG
6.0000 mg/kg | Freq: Once | INTRAVENOUS | Status: AC
  Administered 2014-04-09: 315 mg via INTRAVENOUS
  Filled 2014-04-09: qty 15

## 2014-04-09 MED ORDER — HEPARIN SOD (PORK) LOCK FLUSH 100 UNIT/ML IV SOLN
500.0000 [IU] | Freq: Once | INTRAVENOUS | Status: DC | PRN
  Filled 2014-04-09: qty 5

## 2014-04-09 MED ORDER — SODIUM CHLORIDE 0.9 % IJ SOLN
10.0000 mL | INTRAMUSCULAR | Status: DC | PRN
  Filled 2014-04-09: qty 10

## 2014-04-09 NOTE — Patient Instructions (Signed)
[facility] Discharge Instructions for Patients Receiving Chemotherapy  Today you received the following chemotherapy agents Herceptin  To help prevent nausea and vomiting after your treatment, we encourage you to take your nausea medication     If you develop nausea and vomiting that is not controlled by your nausea medication, call the clinic.   BELOW ARE SYMPTOMS THAT SHOULD BE REPORTED IMMEDIATELY:  *FEVER GREATER THAN 100.5 F  *CHILLS WITH OR WITHOUT FEVER  NAUSEA AND VOMITING THAT IS NOT CONTROLLED WITH YOUR NAUSEA MEDICATION  *UNUSUAL SHORTNESS OF BREATH  *UNUSUAL BRUISING OR BLEEDING  TENDERNESS IN MOUTH AND THROAT WITH OR WITHOUT PRESENCE OF ULCERS  *URINARY PROBLEMS  *BOWEL PROBLEMS  UNUSUAL RASH Items with * indicate a potential emergency and should be followed up as soon as possible.  Feel free to call the clinic you have any questions or concerns. The clinic phone number is [phone].    

## 2014-04-09 NOTE — Telephone Encounter (Signed)
LMOVM - date time and location of echo.  Pt to call clinic with any questions.

## 2014-04-10 ENCOUNTER — Other Ambulatory Visit (HOSPITAL_COMMUNITY): Payer: Commercial Managed Care - HMO

## 2014-04-12 ENCOUNTER — Ambulatory Visit: Payer: Medicare HMO | Admitting: Neurology

## 2014-04-22 ENCOUNTER — Telehealth: Payer: Self-pay | Admitting: Nurse Practitioner

## 2014-04-22 NOTE — Telephone Encounter (Signed)
Niketa called - She was sent to Dr. Posey Pronto whom then sent her to Dr. Leonides Schanz. She needs a referral from [facility] to see Dr. Leonides Schanz. Dr. Guido Sander phone number is [phone] and his fax is [phone].

## 2014-04-22 NOTE — Telephone Encounter (Signed)
Please advise referral to Neurosurgeon?

## 2014-04-23 ENCOUNTER — Ambulatory Visit (HOSPITAL_COMMUNITY)
Admission: RE | Admit: 2014-04-23 | Discharge: 2014-04-23 | Disposition: A | Discharge status: Home / Self Care | Payer: Commercial Managed Care - HMO | Source: Ambulatory Visit | Attending: Oncology | Admitting: Oncology

## 2014-04-23 ENCOUNTER — Other Ambulatory Visit (HOSPITAL_COMMUNITY): Payer: Commercial Managed Care - HMO

## 2014-04-23 DIAGNOSIS — C50919 Malignant neoplasm of unspecified site of unspecified female breast: Secondary | ICD-10-CM | POA: Diagnosis not present

## 2014-04-23 DIAGNOSIS — Z5111 Encounter for antineoplastic chemotherapy: Secondary | ICD-10-CM | POA: Diagnosis not present

## 2014-04-23 NOTE — Progress Notes (Signed)
  Echocardiogram 2D Echocardiogram has been performed.  Darlina Sicilian M 04/23/2014, 11:37 AM

## 2014-04-24 ENCOUNTER — Encounter: Payer: Self-pay | Admitting: Neurology

## 2014-04-24 ENCOUNTER — Ambulatory Visit (INDEPENDENT_AMBULATORY_CARE_PROVIDER_SITE_OTHER): Payer: Commercial Managed Care - HMO | Admitting: Neurology

## 2014-04-24 VITALS — BP 140/90 | HR 70 | Ht 66.0 in | Wt 112.3 lb

## 2014-04-24 DIAGNOSIS — C50911 Malignant neoplasm of unspecified site of right female breast: Secondary | ICD-10-CM

## 2014-04-24 DIAGNOSIS — F0390 Unspecified dementia without behavioral disturbance: Secondary | ICD-10-CM | POA: Diagnosis not present

## 2014-04-24 MED ORDER — DONEPEZIL HCL 5 MG PO TABS: ORAL_TABLET | ORAL | Status: DC

## 2014-04-24 NOTE — Progress Notes (Signed)
Required to close

## 2014-04-24 NOTE — Patient Instructions (Signed)
1.  Start aricept 5mg  daily x 1 month, then increase to 2 tablets (10mg ) daily thereafter 2.  We will contact you with information regarding functional driving assessment 3.  Recommend no driving  4.  Please STOP working immediately due to increased liability 5.  Return to clinic in 3 months

## 2014-04-24 NOTE — Progress Notes (Signed)
Follow-up Visit   Date: 04/25/2014    Gabrielle [name] MRN: [id number] DOB: 12/19/1930   Interim History: Gabrielle [name] is a 81 y.o. right-handed Caucasian female with history of tobacco use returning to the clinic for follow-up of memory loss.  The patient was accompanied to the clinic by son who also provides collateral information.    History of present illness: Since January 2015, her son noticed changes in her mother's cognitive ability. She has been having problems with short-term memory and not has "sharp" as she used to be. She is highly functioning at baseline and works part-time at [employer] and has not noticed any problems at work. Problems are mostly with short-term memory such as remembering names, dates, appointments and she has started to write things down more. Her son and daughter have noticed the changes more than patient. Hobbies include reading, working, and lately she has been organizing her ex-husbands home since he passed away last year. They had been divorced for 20+ years.   She is driving without any difficulty. She denies getting lost or being involved in any car accidents. She cooks for herself and eats 2-3 meals per day. Her son says that her response is slower, such as when talking in conversation. Her mood is good. Sleep is fair, averaging 6-7 hours per night. No bizarre or inappropriate behavior changes.  She has not seen a physician in 10-15 years and saw primary care physician on Friday. She was found to have UTI and started on ciprofloxacin (not started taking it yet).    Of note, she has lost 50lb unintentionally in the past 7-months. Appetite remains good.   UPDATE 12/13/2013:  She was found to have right breast cancer and is scheduled to have mastectomy next week.  She is having a harder time learning new tasks and continues to have problems with short-term memory.  She has difficulty maintaining her mail, e-mails, lost her checkbook in her mail, and  daughter says her home is in disarray.  She usually eats about two meals a day, usually gets takeout or eats frozen meals.  Family does not have any concerns with home safety or with her driving. She has not been involved in any motor vehicle accidents. On a rare occasion, she does admit to getting lost.  UPDATE 04/24/2014:  She is accompanied by her son, Gabrielle [name].  She underwent right mastectomy and is now undergoing chemotherapy. Since her last visit, her daughter contacted our office with concerns that patient was ordering $1200 of online magazine which is atypical for her.  Neuropsychology testing showed evidence of vascular dementia (mild).  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   Medications:  Current Outpatient Prescriptions on File Prior to Visit  Medication Sig Dispense Refill  . cephALEXin (KEFLEX) 500 MG capsule Take 1 capsule (500 mg total) by mouth 4 (four) times daily. 40 capsule 0  . Cholecalciferol (CVS D3) 5000 UNITS capsule Take 5,000 Units by mouth every other day.     . Cyanocobalamin (VITAMIN B-12 PO) Take 1 drop by mouth daily.     . fluticasone (FLONASE) 50 MCG/ACT nasal spray     . HYDROcodone-acetaminophen (NORCO/VICODIN) 5-325 MG per tablet     . Tiotropium Bromide Monohydrate (SPIRIVA RESPIMAT) 2.5 MCG/ACT AERS Inhale 2 puffs into the lungs every morning. @@ 11 am    . vitamin A 10000  UNIT capsule Take 10,000 Units by mouth every morning.      No current facility-administered medications on file prior to visit.    Allergies: No Known Allergies   Review of Systems:  CONSTITUTIONAL: No fevers, chills, night sweats, +weight loss.   EYES: No visual changes or eye pain ENT: No hearing changes.  No history of nose bleeds.   RESPIRATORY: No cough, wheezing and shortness of breath.   CARDIOVASCULAR: Negative for chest pain, and palpitations.   GI: Negative for  abdominal discomfort, blood in stools or black stools.  No recent change in bowel habits.   GU:  No history of incontinence.   MUSCLOSKELETAL: No history of joint pain or swelling.  No myalgias.   SKIN: Negative for lesions, rash, and itching.   ENDOCRINE: Negative for cold or heat intolerance, polydipsia or goiter.   PSYCH:  No  depression or anxiety symptoms.   NEURO: As Above.   Vital Signs:  BP 140/90 mmHg  Pulse 70  Ht 5\' 6"  (1.676 m)  Wt 112 lb 5 oz (50.945 kg)  BMI 18.14 kg/m2  SpO2 99%   Neurological Exam:  MENTAL STATUS:  Awake, flat affect, oriented to person, place, date, year  CRANIAL NERVES: Pupils equal round and reactive to light.  Normal conjugate, extra-ocular eye movements in all directions of gaze.  No ptosis. Normal facial sensation.  Face is symmetric. Palate elevates symmetrically.  Tongue is midline.  MOTOR:  Motor strength is 5-/5 in all extremities. Tone is normal.    MSRs:  Reflexes are 2+/4 throughout, except absent Achilles bilaterally.  COORDINATION/GAIT:    Stooped posture, gait narrow based and stable.   Data: Lab Results  Component Value Date   [id number]* 12/13/2013   Lab Results  Component Value Date   TSH 1.70 10/05/2013   MRI brain 01/22/2014: No acute infarct. Remote medial right thalamic infarct which was partially hemorrhagic with presence of blood breakdown products. Remote tiny inferior cerebellar infarct. Mild to moderate small vessel disease type changes.  No intracranial mass or bony destructive lesion to suggest the presence of intracranial metastatic disease.  Global atrophy without hydrocephalus.  Right vertebral artery and right posterior inferior cerebellar artery may be occluded.  IMPRESSION: [name] is 81 year old female returning for evaluation of cognitive impairment, as confirmed by her recent neuropsychological test which showed vascular dementia.  MRI brain was shows global atrophy and small chronic  infarct involving the right thalamus. Her family on multiple occasions including today's visit, have mentioned that they are concerned about her cognitive impairment especially has it relates to her work. 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  Much of today's visit was spent try to explain why she needs to stop working; in fact, I encouraged her to allow someone else file her own taxes.    She also has other medical comorbidities including newly diagnosed breast cancer and with her memory decline, paraneoplastic syndrome may need to be considered if there is rapid worsening of cognition.  We also had a lengthy discussion regarding home safety and establishing POA as well as using [place].  She is living alone and family do not express any concerns with home safety at this time.   PLAN/RECOMMENDATIONS:  1.  Start aricept 5mg  daily x 1 month, then increase to 2 tablets (10mg ) daily thereafter 2.  Start aspirin 81mg  daily 3.  Recommend no driving, information on functional driving assessment will be provided to patient 4.  It is my strong clinical recommendation that the patient stop working immediately which was also stressed to her son who was present at today's visit 5.  Return to clinic in 3 months   The duration of this appointment visit was 45 minutes of face-to-face time with the patient.  Greater than 50% of this time was spent in counseling, explanation of diagnosis, planning of further management, and coordination of care.   Thank you for allowing me to participate in patient's care.  If I can answer any additional questions, I would be pleased to do so.    Sincerely,    Donika K. Posey Pronto, DO

## 2014-04-26 ENCOUNTER — Telehealth: Payer: Self-pay | Admitting: *Deleted

## 2014-04-26 NOTE — Telephone Encounter (Signed)
I spoke with patient's daughter and she said her mom said something while she was at work about the doctor's not wanting her to work.  She said that [place] is asking for a letter from one of the doctors.  She asked if patient asked for you to write a letter.  I informed her that she has not requested that from our office and that you would not write a letter to keep her working.  Her daughter is requesting social work or something so that maybe they can get someone in to help patient with groceries and appointments.  Informed her that I would talk with you and call her back.

## 2014-04-26 NOTE — Telephone Encounter (Signed)
Please call pt & her son, Gabrielle [name].  Advise that the neuro-behaviorist suggested she have a hearing evaluation. Ask if she wants a referral to have audiologist check hearing.  Also, I would like to see her early June, after she sees Dr Posey Pronto again. Pls schedule appt.

## 2014-04-26 NOTE — Telephone Encounter (Signed)
Patients daughter [name]) would like for you to give her a call in reference to her mother.  Call back number [phone]

## 2014-04-26 NOTE — Telephone Encounter (Signed)
[name] returned call. He said that he is fine with referral, however he wanted [place] to tell [place] that she needs it. [name] also stated that pt is being hard to contact at the moment.

## 2014-04-26 NOTE — Telephone Encounter (Addendum)
See other phone note

## 2014-04-26 NOTE — Telephone Encounter (Signed)
Per [place]: Please call pt & her son, [name].  Advise that the neuro-behaviorist suggested she have a hearing evaluation. Ask if she wants a referral to have audiologist check hearing.  Also, I would like to see her early June, after she sees Dr Posey Pronto again. Pls schedule appt

## 2014-04-26 NOTE — Telephone Encounter (Signed)
LMOVM pt's and her son Renne Crigler, to return call.

## 2014-04-26 NOTE — Telephone Encounter (Signed)
We had a very lengthy discussion on Wednesday regarding liability with her working, especially when she is having cognitive problems.  Unfortunately, I understand that she wants to work but it is not safe for her to do so.  If she would like a letter from me, I will state that she cannot continue her current level of work. Please send referral to [facility] case managers to see if they can provide any assistance for patient.  Quirino Kakos K. Posey Pronto, DO

## 2014-04-29 NOTE — Telephone Encounter (Signed)
Referral faxed to [facility] 

## 2014-04-30 ENCOUNTER — Ambulatory Visit (HOSPITAL_BASED_OUTPATIENT_CLINIC_OR_DEPARTMENT_OTHER): Payer: Commercial Managed Care - HMO

## 2014-04-30 ENCOUNTER — Other Ambulatory Visit (HOSPITAL_BASED_OUTPATIENT_CLINIC_OR_DEPARTMENT_OTHER): Payer: Commercial Managed Care - HMO

## 2014-04-30 DIAGNOSIS — Z5112 Encounter for antineoplastic immunotherapy: Secondary | ICD-10-CM | POA: Diagnosis not present

## 2014-04-30 DIAGNOSIS — C50919 Malignant neoplasm of unspecified site of unspecified female breast: Secondary | ICD-10-CM

## 2014-04-30 DIAGNOSIS — C50411 Malignant neoplasm of upper-outer quadrant of right female breast: Secondary | ICD-10-CM

## 2014-04-30 DIAGNOSIS — F17210 Nicotine dependence, cigarettes, uncomplicated: Secondary | ICD-10-CM

## 2014-04-30 LAB — COMPREHENSIVE METABOLIC PANEL (CC13)
ALT: 18 U/L (ref 0–55)
AST: 22 U/L (ref 5–34)
Albumin: 3.3 g/dL — ABNORMAL LOW (ref 3.5–5.0)
Alkaline Phosphatase: 75 U/L (ref 40–150)
Anion Gap: 10 mEq/L (ref 3–11)
BILIRUBIN TOTAL: 0.65 mg/dL (ref 0.20–1.20)
BUN: 24 mg/dL (ref 7.0–26.0)
CHLORIDE: 107 meq/L (ref 98–109)
CO2: 26 mEq/L (ref 22–29)
Calcium: 9.1 mg/dL (ref 8.4–10.4)
Creatinine: 1.1 mg/dL (ref 0.6–1.1)
EGFR: 47 mL/min/{1.73_m2} — AB (ref >90)
GLUCOSE: 104 mg/dL (ref 70–140)
Potassium: 3.8 mEq/L (ref 3.5–5.1)
Sodium: 143 mEq/L (ref 136–145)
Total Protein: 6 g/dL — ABNORMAL LOW (ref 6.4–8.3)

## 2014-04-30 LAB — CBC WITH DIFFERENTIAL/PLATELET
BASO%: 1.2 % (ref 0.0–2.0)
BASOS ABS: 0.1 10*3/uL (ref 0.0–0.1)
EOS%: 2.4 % (ref 0.0–7.0)
Eosinophils Absolute: 0.2 10*3/uL (ref 0.0–0.5)
HEMATOCRIT: 40.4 % (ref 34.8–46.6)
HGB: 13.1 g/dL (ref 11.6–15.9)
LYMPH#: 1.8 10*3/uL (ref 0.9–3.3)
LYMPH%: 28.3 % (ref 14.0–49.7)
MCH: 32.7 pg (ref 25.1–34.0)
MCHC: 32.5 g/dL (ref 31.5–36.0)
MCV: 100.6 fL (ref 79.5–101.0)
MONO#: 0.6 10*3/uL (ref 0.1–0.9)
MONO%: 9.8 % (ref 0.0–14.0)
NEUT%: 58.3 % (ref 38.4–76.8)
NEUTROS ABS: 3.8 10*3/uL (ref 1.5–6.5)
Platelets: 220 10*3/uL (ref 145–400)
RBC: 4.02 10*6/uL (ref 3.70–5.45)
RDW: 12.6 % (ref 11.2–14.5)
WBC: 6.5 10*3/uL (ref 3.9–10.3)

## 2014-04-30 MED ORDER — DIPHENHYDRAMINE HCL 25 MG PO CAPS
ORAL_CAPSULE | ORAL | Status: AC
  Filled 2014-04-30: qty 1

## 2014-04-30 MED ORDER — DIPHENHYDRAMINE HCL 25 MG PO CAPS
25.0000 mg | ORAL_CAPSULE | Freq: Once | ORAL | Status: AC
  Administered 2014-04-30: 25 mg via ORAL

## 2014-04-30 MED ORDER — ACETAMINOPHEN 325 MG PO TABS
650.0000 mg | ORAL_TABLET | Freq: Once | ORAL | Status: AC
  Administered 2014-04-30: 650 mg via ORAL

## 2014-04-30 MED ORDER — SODIUM CHLORIDE 0.9 % IV SOLN
Freq: Once | INTRAVENOUS | Status: AC
  Administered 2014-04-30: 14:00:00 via INTRAVENOUS

## 2014-04-30 MED ORDER — ACETAMINOPHEN 325 MG PO TABS
ORAL_TABLET | ORAL | Status: AC
  Filled 2014-04-30: qty 2

## 2014-04-30 MED ORDER — TRASTUZUMAB CHEMO INJECTION 440 MG
6.0000 mg/kg | Freq: Once | INTRAVENOUS | Status: AC
  Administered 2014-04-30: 315 mg via INTRAVENOUS
  Filled 2014-04-30: qty 15

## 2014-04-30 NOTE — Patient Instructions (Signed)
[facility] Discharge Instructions for Patients Receiving Chemotherapy  Today you received the following chemotherapy agents herceptin  To help prevent nausea and vomiting after your treatment, we encourage you to take your nausea medication if needed   If you develop nausea and vomiting that is not controlled by your nausea medication, call the clinic.   BELOW ARE SYMPTOMS THAT SHOULD BE REPORTED IMMEDIATELY:  *FEVER GREATER THAN 100.5 F  *CHILLS WITH OR WITHOUT FEVER  NAUSEA AND VOMITING THAT IS NOT CONTROLLED WITH YOUR NAUSEA MEDICATION  *UNUSUAL SHORTNESS OF BREATH  *UNUSUAL BRUISING OR BLEEDING  TENDERNESS IN MOUTH AND THROAT WITH OR WITHOUT PRESENCE OF ULCERS  *URINARY PROBLEMS  *BOWEL PROBLEMS  UNUSUAL RASH Items with * indicate a potential emergency and should be followed up as soon as possible.  Feel free to call the clinic you have any questions or concerns. The clinic phone number is [phone].

## 2014-05-02 ENCOUNTER — Ambulatory Visit: Payer: Commercial Managed Care - HMO | Admitting: Psychology

## 2014-05-14 NOTE — Telephone Encounter (Signed)
Patient was seen 03/05/14 by Dr. Leonides Schanz

## 2014-05-21 ENCOUNTER — Ambulatory Visit (HOSPITAL_BASED_OUTPATIENT_CLINIC_OR_DEPARTMENT_OTHER): Payer: Commercial Managed Care - HMO

## 2014-05-21 ENCOUNTER — Other Ambulatory Visit (HOSPITAL_BASED_OUTPATIENT_CLINIC_OR_DEPARTMENT_OTHER): Payer: Commercial Managed Care - HMO

## 2014-05-21 ENCOUNTER — Other Ambulatory Visit: Payer: Self-pay | Admitting: Oncology

## 2014-05-21 DIAGNOSIS — Z5112 Encounter for antineoplastic immunotherapy: Secondary | ICD-10-CM | POA: Diagnosis not present

## 2014-05-21 DIAGNOSIS — C50411 Malignant neoplasm of upper-outer quadrant of right female breast: Secondary | ICD-10-CM

## 2014-05-21 DIAGNOSIS — C50919 Malignant neoplasm of unspecified site of unspecified female breast: Secondary | ICD-10-CM

## 2014-05-21 DIAGNOSIS — F17210 Nicotine dependence, cigarettes, uncomplicated: Secondary | ICD-10-CM

## 2014-05-21 LAB — CBC WITH DIFFERENTIAL/PLATELET
BASO%: 0.7 % (ref 0.0–2.0)
BASOS ABS: 0.1 10*3/uL (ref 0.0–0.1)
EOS%: 1.9 % (ref 0.0–7.0)
Eosinophils Absolute: 0.1 10*3/uL (ref 0.0–0.5)
HCT: 39.9 % (ref 34.8–46.6)
HGB: 13.3 g/dL (ref 11.6–15.9)
LYMPH%: 29.1 % (ref 14.0–49.7)
MCH: 33.3 pg (ref 25.1–34.0)
MCHC: 33.3 g/dL (ref 31.5–36.0)
MCV: 100 fL (ref 79.5–101.0)
MONO#: 0.7 10*3/uL (ref 0.1–0.9)
MONO%: 9.8 % (ref 0.0–14.0)
NEUT#: 4 10*3/uL (ref 1.5–6.5)
NEUT%: 58.5 % (ref 38.4–76.8)
Platelets: 229 10*3/uL (ref 145–400)
RBC: 3.99 10*6/uL (ref 3.70–5.45)
RDW: 12.7 % (ref 11.2–14.5)
WBC: 6.9 10*3/uL (ref 3.9–10.3)
lymph#: 2 10*3/uL (ref 0.9–3.3)

## 2014-05-21 MED ORDER — DIPHENHYDRAMINE HCL 25 MG PO CAPS
25.0000 mg | ORAL_CAPSULE | Freq: Once | ORAL | Status: AC
  Administered 2014-05-21: 25 mg via ORAL

## 2014-05-21 MED ORDER — ACETAMINOPHEN 325 MG PO TABS
ORAL_TABLET | ORAL | Status: AC
  Filled 2014-05-21: qty 2

## 2014-05-21 MED ORDER — ACETAMINOPHEN 325 MG PO TABS
650.0000 mg | ORAL_TABLET | Freq: Once | ORAL | Status: AC
  Administered 2014-05-21: 650 mg via ORAL

## 2014-05-21 MED ORDER — TRASTUZUMAB CHEMO INJECTION 440 MG
6.0000 mg/kg | Freq: Once | INTRAVENOUS | Status: AC
  Administered 2014-05-21: 315 mg via INTRAVENOUS
  Filled 2014-05-21: qty 15

## 2014-05-21 MED ORDER — SODIUM CHLORIDE 0.9 % IV SOLN
Freq: Once | INTRAVENOUS | Status: AC
  Administered 2014-05-21: 15:00:00 via INTRAVENOUS

## 2014-05-21 MED ORDER — DIPHENHYDRAMINE HCL 25 MG PO CAPS
ORAL_CAPSULE | ORAL | Status: AC
  Filled 2014-05-21: qty 2

## 2014-05-21 NOTE — Patient Instructions (Signed)
[facility] Discharge Instructions for Patients Receiving Chemotherapy  Today you received the following chemotherapy agents:  Herceptin  To help prevent nausea and vomiting after your treatment, we encourage you to take your nausea medication as prescribed.   If you develop nausea and vomiting that is not controlled by your nausea medication, call the clinic.   BELOW ARE SYMPTOMS THAT SHOULD BE REPORTED IMMEDIATELY:  *FEVER GREATER THAN 100.5 F  *CHILLS WITH OR WITHOUT FEVER  NAUSEA AND VOMITING THAT IS NOT CONTROLLED WITH YOUR NAUSEA MEDICATION  *UNUSUAL SHORTNESS OF BREATH  *UNUSUAL BRUISING OR BLEEDING  TENDERNESS IN MOUTH AND THROAT WITH OR WITHOUT PRESENCE OF ULCERS  *URINARY PROBLEMS  *BOWEL PROBLEMS  UNUSUAL RASH Items with * indicate a potential emergency and should be followed up as soon as possible.  Feel free to call the clinic you have any questions or concerns. The clinic phone number is [phone].  Please show the CHEMO ALERT CARD at check-in to the Emergency Department and triage nurse.   

## 2014-05-22 ENCOUNTER — Other Ambulatory Visit: Payer: Self-pay

## 2014-05-22 NOTE — Patient Outreach (Signed)
   05/22/2014   SONG MYRE [birth year] [id number]  ASSESSMENT:  Telephone call to [name] for patient per MD referral.  Unable to reach.  HIPAA compliant voice message left with call back phone number.   PLAN:  Will attempt 3rd outreach call within 2 days. Previous notes in [facility] system.

## 2014-05-23 ENCOUNTER — Other Ambulatory Visit: Payer: Self-pay

## 2014-05-23 NOTE — Patient Outreach (Signed)
   05/23/2014   Gabrielle [name] April 07, 1930 [id number]   ASSESSMENT:  Third telephone outreach to [name] for patient per MD referral.  Unable to reach.  HIPAA compliant voice message left with call  Back phone number.   Quinn Plowman RN,BSN,CCM [place] Coordinator [phone]

## 2014-06-11 ENCOUNTER — Other Ambulatory Visit (HOSPITAL_BASED_OUTPATIENT_CLINIC_OR_DEPARTMENT_OTHER): Payer: Commercial Managed Care - HMO

## 2014-06-11 DIAGNOSIS — C50411 Malignant neoplasm of upper-outer quadrant of right female breast: Secondary | ICD-10-CM | POA: Diagnosis not present

## 2014-06-11 DIAGNOSIS — F17210 Nicotine dependence, cigarettes, uncomplicated: Secondary | ICD-10-CM

## 2014-06-11 DIAGNOSIS — C50919 Malignant neoplasm of unspecified site of unspecified female breast: Secondary | ICD-10-CM

## 2014-06-11 LAB — COMPREHENSIVE METABOLIC PANEL (CC13)
ALT: 24 U/L (ref 0–55)
ANION GAP: 10 meq/L (ref 3–11)
AST: 26 U/L (ref 5–34)
Albumin: 3.6 g/dL (ref 3.5–5.0)
Alkaline Phosphatase: 97 U/L (ref 40–150)
BUN: 20.3 mg/dL (ref 7.0–26.0)
CALCIUM: 8.9 mg/dL (ref 8.4–10.4)
CHLORIDE: 104 meq/L (ref 98–109)
CO2: 29 meq/L (ref 22–29)
CREATININE: 0.8 mg/dL (ref 0.6–1.1)
EGFR: 65 mL/min/{1.73_m2} — ABNORMAL LOW (ref >90)
GLUCOSE: 84 mg/dL (ref 70–140)
Potassium: 3.9 mEq/L (ref 3.5–5.1)
Sodium: 142 mEq/L (ref 136–145)
Total Bilirubin: 0.48 mg/dL (ref 0.20–1.20)
Total Protein: 6.7 g/dL (ref 6.4–8.3)

## 2014-06-11 LAB — CBC WITH DIFFERENTIAL/PLATELET
BASO%: 0.9 % (ref 0.0–2.0)
BASOS ABS: 0.1 10*3/uL (ref 0.0–0.1)
EOS ABS: 0.1 10*3/uL (ref 0.0–0.5)
EOS%: 1.7 % (ref 0.0–7.0)
HCT: 41.8 % (ref 34.8–46.6)
HGB: 13.9 g/dL (ref 11.6–15.9)
LYMPH#: 2.1 10*3/uL (ref 0.9–3.3)
LYMPH%: 28.3 % (ref 14.0–49.7)
MCH: 33.3 pg (ref 25.1–34.0)
MCHC: 33.3 g/dL (ref 31.5–36.0)
MCV: 100.2 fL (ref 79.5–101.0)
MONO#: 0.7 10*3/uL (ref 0.1–0.9)
MONO%: 9.4 % (ref 0.0–14.0)
NEUT#: 4.5 10*3/uL (ref 1.5–6.5)
NEUT%: 59.7 % (ref 38.4–76.8)
Platelets: 243 10*3/uL (ref 145–400)
RBC: 4.17 10*6/uL (ref 3.70–5.45)
RDW: 13.1 % (ref 11.2–14.5)
WBC: 7.5 10*3/uL (ref 3.9–10.3)

## 2014-06-12 NOTE — Patient Outreach (Signed)
[facility]) Care Management  06/12/2014  BRANDALYNN OFALLON 06/14/1930 [id number]   No response from patient after 3 outreach calls and letter.    PLAN:  RNCM will refer to Lurline Del to close due to inability to establish contact. RNCM will notify patients primary care provider of inability to establish contact with patient.  Quinn Plowman RN,BSN,CCM [place] Coordinator [phone]

## 2014-06-17 NOTE — Patient Outreach (Signed)
[facility]) Care Management  06/17/2014  Gabrielle [name] 06/25/1930 [id number]   Received notification from Quinn Plowman, RN to close case due to inability to establish contact with patient for [facility] services.  Case closed at this time.  [name]. [place] CM Assistant Phone: [phone] Fax: [phone]

## 2014-06-18 ENCOUNTER — Ambulatory Visit: Payer: Commercial Managed Care - HMO | Admitting: Neurology

## 2014-06-26 ENCOUNTER — Telehealth: Payer: Self-pay | Admitting: Oncology

## 2014-06-26 NOTE — Telephone Encounter (Signed)
Returned Advertising account executive. Left voicemail confirming patient is schedule for 05/03 for visit and herceptin.

## 2014-06-26 NOTE — Telephone Encounter (Signed)
Pt dtr Linus Orn called re missing q3w herceptin appointments. Returned call and added appointments as requested per 03/12/14 pof. Next herceptin was added to 5/3 lb/GM appointments. dtr is aware and has next appointment d/t for 5/3. Patient will be given new schedule and mychart code (per dtr) at 5/3 visit.

## 2014-06-27 ENCOUNTER — Encounter (HOSPITAL_COMMUNITY): Payer: Self-pay

## 2014-06-27 ENCOUNTER — Ambulatory Visit (HOSPITAL_COMMUNITY)
Admission: RE | Admit: 2014-06-27 | Discharge: 2014-06-27 | Disposition: A | Discharge status: Home / Self Care | Payer: Commercial Managed Care - HMO | Source: Ambulatory Visit | Attending: Oncology | Admitting: Oncology

## 2014-06-27 DIAGNOSIS — Z79899 Other long term (current) drug therapy: Secondary | ICD-10-CM | POA: Diagnosis not present

## 2014-06-27 DIAGNOSIS — Z9011 Acquired absence of right breast and nipple: Secondary | ICD-10-CM | POA: Insufficient documentation

## 2014-06-27 DIAGNOSIS — Z9889 Other specified postprocedural states: Secondary | ICD-10-CM | POA: Diagnosis not present

## 2014-06-27 DIAGNOSIS — R911 Solitary pulmonary nodule: Secondary | ICD-10-CM | POA: Diagnosis not present

## 2014-06-27 DIAGNOSIS — R634 Abnormal weight loss: Secondary | ICD-10-CM

## 2014-06-27 DIAGNOSIS — C50411 Malignant neoplasm of upper-outer quadrant of right female breast: Secondary | ICD-10-CM | POA: Insufficient documentation

## 2014-06-27 DIAGNOSIS — C50919 Malignant neoplasm of unspecified site of unspecified female breast: Secondary | ICD-10-CM

## 2014-06-27 DIAGNOSIS — R918 Other nonspecific abnormal finding of lung field: Secondary | ICD-10-CM | POA: Diagnosis not present

## 2014-06-27 DIAGNOSIS — E538 Deficiency of other specified B group vitamins: Secondary | ICD-10-CM

## 2014-06-27 MED ORDER — IOHEXOL 300 MG/ML  SOLN
80.0000 mL | Freq: Once | INTRAMUSCULAR | Status: AC | PRN
Start: 2014-06-27 — End: 2014-06-27
  Administered 2014-06-27: 80 mL via INTRAVENOUS

## 2014-07-02 ENCOUNTER — Other Ambulatory Visit (HOSPITAL_BASED_OUTPATIENT_CLINIC_OR_DEPARTMENT_OTHER): Payer: Commercial Managed Care - HMO

## 2014-07-02 ENCOUNTER — Ambulatory Visit (HOSPITAL_BASED_OUTPATIENT_CLINIC_OR_DEPARTMENT_OTHER): Payer: Commercial Managed Care - HMO | Admitting: Oncology

## 2014-07-02 ENCOUNTER — Other Ambulatory Visit: Payer: Commercial Managed Care - HMO

## 2014-07-02 ENCOUNTER — Ambulatory Visit (HOSPITAL_BASED_OUTPATIENT_CLINIC_OR_DEPARTMENT_OTHER): Payer: Commercial Managed Care - HMO

## 2014-07-02 VITALS — BP 124/62 | HR 61 | Temp 98.0°F | Resp 18 | Ht 66.0 in | Wt 113.1 lb

## 2014-07-02 DIAGNOSIS — Z5112 Encounter for antineoplastic immunotherapy: Secondary | ICD-10-CM | POA: Diagnosis not present

## 2014-07-02 DIAGNOSIS — R911 Solitary pulmonary nodule: Secondary | ICD-10-CM | POA: Diagnosis not present

## 2014-07-02 DIAGNOSIS — F17200 Nicotine dependence, unspecified, uncomplicated: Secondary | ICD-10-CM

## 2014-07-02 DIAGNOSIS — C50411 Malignant neoplasm of upper-outer quadrant of right female breast: Secondary | ICD-10-CM | POA: Diagnosis not present

## 2014-07-02 DIAGNOSIS — J439 Emphysema, unspecified: Secondary | ICD-10-CM

## 2014-07-02 DIAGNOSIS — Z170 Estrogen receptor positive status [ER+]: Secondary | ICD-10-CM

## 2014-07-02 MED ORDER — TRASTUZUMAB CHEMO INJECTION 440 MG
6.0000 mg/kg | Freq: Once | INTRAVENOUS | Status: AC
  Administered 2014-07-02: 315 mg via INTRAVENOUS
  Filled 2014-07-02: qty 15

## 2014-07-02 MED ORDER — ACETAMINOPHEN 325 MG PO TABS
650.0000 mg | ORAL_TABLET | Freq: Once | ORAL | Status: AC
  Administered 2014-07-02: 650 mg via ORAL

## 2014-07-02 MED ORDER — DIPHENHYDRAMINE HCL 25 MG PO CAPS
25.0000 mg | ORAL_CAPSULE | Freq: Once | ORAL | Status: AC
  Administered 2014-07-02: 25 mg via ORAL

## 2014-07-02 MED ORDER — DIPHENHYDRAMINE HCL 25 MG PO CAPS
ORAL_CAPSULE | ORAL | Status: AC
  Filled 2014-07-02: qty 1

## 2014-07-02 MED ORDER — ACETAMINOPHEN 325 MG PO TABS
ORAL_TABLET | ORAL | Status: AC
  Filled 2014-07-02: qty 2

## 2014-07-02 MED ORDER — SODIUM CHLORIDE 0.9 % IV SOLN
Freq: Once | INTRAVENOUS | Status: AC
  Administered 2014-07-02: 12:00:00 via INTRAVENOUS

## 2014-07-02 NOTE — Progress Notes (Signed)
[facility]  [phone] [phone]     ID: LORENNA LURRY DOB: 1930/04/04  MR#: [id number]  BOF#:[id number]  Patient Care Team: Irene Pap, NP as PCP - General (Nurse Practitioner) Excell Seltzer, MD as Consulting Physician (General Surgery) Chauncey Cruel, MD as Consulting Physician (Oncology) Arloa Koh, MD as Consulting Physician (Radiation Oncology) Dannielle Karvonen, RN as Registered Nurse OTHER MD:  CHIEF COMPLAINT: Triple positive breast cancer  CURRENT TREATMENT:  Anastrozole, ttrasztuzumab   BREAST CANCER HISTORY: From the original intake note:  Gustava had not been to a doctor for "more than 20 years. Since Christmas 2014 and the family has become increasingly concerned that she appeared to be losing weight and was a bit more confused. They asked the patient how much weight she had lost and she said she had lost "about 50 pounds.". However, the only weight we have available before August of this year is from August of 2014 and it was 115 pounds at that time.  Nevertheless with this concern the patient agreed to be seen by a physician and on 10/06/2013 she was seen by Dr. Kathlen Mody who obtained a full battery of tests. He found the patient to have a urinary tract infection with Escherichia coli, which may explain some of her confusion. Vitamin B 12 level was 215, which is in the low normal range. Hemoglobin was 14.7 with an MCV of 99.0. The patient was started on B12 supplementation parenterally and a chest x-ray was obtained 10/05/2013 which showed evidence of emphysema and prior granulomatous disease. To make sure an occult cancer was not being missed a CT scan of the chest abdomen and pelvis was obtained 11/19/2013. This showed a calcified granuloma in the inferior lingula. There was also a noncalcified 6 mm nodule in the lateral portion of the right lower lobe. There was no mediastinal mass or adenopathy. There were calcified granulomata  in the spleen as well. There was significant atherosclerotic calcifications. There was sigmoid diverticulosis noted incidentally. In addition, a 1.8 cm right breast mass was noted.  The breast mass was evaluated further with bilateral diagnostic mammography and right breast ultrasonography of the breast Center 11/26/2013. The breast density was category C. There was indeed an irregular mass in the right breast which was on palpation firm nontender and noted at the 10:30 position. Ultrasound confirmed a hypoechoic irregular mass measuring up to 3 cm. There was no right axillary adenopathy noted.  Biopsy of the right breast mass in question 11/26/2013 showed ([id number]) and invasive ductal carcinoma, grade 2, estrogen receptor 100% positive, progesterone receptor 59% positive, both with strong staining intensity, with an MIB-1 of 31%, and with HER-2 amplification, the signals ratio being 2.79 and the copy number per cell 5.45.  On 11-2013 the patient underwent bilateral breast MRI. This showed the enhancing mass in the upper-outer quadrant of the right breast to measure 2.1 cm. There was no other findings in the right breast, left breast, or either axilla.  The patient's subsequent history is as detailed below  INTERVAL HISTORY: Glorianne returns today for follow-up of her breast cancer.  She is due for trastuzumab today. She continues on anastrozole, which she apparently is tolerating with no side effects. She is also doing well with the trastuzumab.    today for some reason she tells me she arrived at 10:30 in the morning for her 11:00 appointment, and was only called back at approximately noon. She said that the greeter told her to  sit down and wait twice. I spoke with the greeter and she tells me the patient arrived late and proceeded directly to check it. I have no way of establishing which of these versions his correct.  REVIEW OF SYSTEMS:  A detailed review of systems today was otherwise  noncontributory  PAST MEDICAL HISTORY: Past Medical History  Diagnosis Date  . Cataract   . Retina hole   . COPD (chronic obstructive pulmonary disease)   . Macular degeneration   . Anxiety   . Breast cancer of upper-outer quadrant of right female breast   . Complication of anesthesia     nausea and vomiting   . PONV (postoperative nausea and vomiting)   . Shortness of breath     increased activity   . Pneumonia     hx of     PAST SURGICAL HISTORY: Past Surgical History  Procedure Laterality Date  . Cataract extraction Bilateral   . Retinal laser procedure    . Abdominal hysterectomy  1980    (ovaries intact)  . Appendectomy    . Total mastectomy Right 12/18/2013    Procedure: RIGHT TOTAL MASTECTOMY;  Surgeon: Excell Seltzer, MD;  Location: [facility];  Service: General;  Laterality: Right;    FAMILY HISTORY Family History  Problem Relation Age of Onset  . Heart attack Mother     Deceased  . Cancer Mother     fallopian tube cancer in 32s; deceased 73  . Throat cancer Father     Deceased 8; smoker  . Prostate cancer Brother 65    Currently 71  . Other Brother     Deceased, hemorhhage of pancreas  . Leukemia Maternal Aunt   . Breast cancer Paternal Aunt     age at diagnosis unknown  . Throat cancer Paternal Uncle     Deceased 59s; smoker  . Leukemia Paternal Grandmother    the patient's father died at the age of 29 from throat cancer which has been diagnosed to years before. The patient's mother was diagnosed at age 54 with fallopian tube carcinoma. She died at age 34. There is no other history of breast or ovarian cancer in the family to the patient's knowledge  GYNECOLOGIC HISTORY:  No LMP recorded. Patient has had a hysterectomy. Menarche age 29, first live birth age 51. The patient is GX P2. She underwent hysterectomy at a relatively young age, without salpingo-oophorectomy. She did not take hormone replacement however she took birth control pills remotely for  approximately 2 years with no complications.  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 She is divorced. Her son Hanah Moultry lives in [place] and works as a Metallurgist. Daughter Dayna Alia lives in [place] where she works as a Architect for the [employer]. Incidentally Dr. Kathe Becton is a nephew of the patient    ADVANCED DIRECTIVES: In place; the patient's son can is her healthcare power of attorney. He can be reached at [place]: History  Substance Use Topics  . Smoking status: Current Every Day Smoker -- 0.50 packs/day for 40 years    Types: Cigarettes  . Smokeless tobacco: Never Used  . Alcohol Use: 1.2 oz/week    2 Glasses of wine per week     Comment: wine a couple times weekly     Colonoscopy: Never  PAP:  Bone density: 2014  Lipid panel:  No Known Allergies  Current  Outpatient Prescriptions  Medication Sig Dispense Refill  . cephALEXin (KEFLEX) 500 MG capsule Take 1 capsule (500 mg total) by mouth 4 (four) times daily. 40 capsule 0  . Cholecalciferol ([employer] D3) 5000 UNITS capsule Take 5,000 Units by mouth every other day.     . Cyanocobalamin (VITAMIN B-12 PO) Take 1 drop by mouth daily.     Marland Kitchen donepezil (ARICEPT) 5 MG tablet Take 1 tablet daily for one month, then increase to 2 tablets daily. 60 tablet 5  . fluticasone (FLONASE) 50 MCG/ACT nasal spray     . HYDROcodone-acetaminophen (NORCO/VICODIN) 5-325 MG per tablet     . Tiotropium Bromide Monohydrate (SPIRIVA RESPIMAT) 2.5 MCG/ACT AERS Inhale 2 puffs into the lungs every morning. @@ 11 am    . vitamin A 10000 UNIT capsule Take 10,000 Units by mouth every morning.      No current facility-administered medications for this visit.    OBJECTIVE: Elderly white woman in no acute distress Filed Vitals:   07/02/14 1211  BP: 124/62  Pulse: 61  Temp: 98 F (36.7 C)  Resp: 18     Body mass index is 18.26  kg/(m^2).    ECOG FS:1 - Symptomatic but completely ambulatory   LAB RESULTS:  CMP     Component Value Date/Time   NA 142 06/11/2014 1315   NA 136* 12/19/2013 0425   K 3.9 06/11/2014 1315   K 4.7 12/19/2013 0425   CL 103 12/19/2013 0425   CO2 29 06/11/2014 1315   CO2 24 12/19/2013 0425   GLUCOSE 84 06/11/2014 1315   GLUCOSE 90 12/19/2013 0425   BUN 20.3 06/11/2014 1315   BUN 17 12/19/2013 0425   CREATININE 0.8 06/11/2014 1315   CREATININE 0.71 12/19/2013 0425   CALCIUM 8.9 06/11/2014 1315   CALCIUM 8.1* 12/19/2013 0425   PROT 6.7 06/11/2014 1315   PROT 6.1 10/05/2013 1158   ALBUMIN 3.6 06/11/2014 1315   ALBUMIN 3.4* 10/05/2013 1158   AST 26 06/11/2014 1315   AST 23 10/05/2013 1158   ALT 24 06/11/2014 1315   ALT 17 10/05/2013 1158   ALKPHOS 97 06/11/2014 1315   ALKPHOS 66 10/05/2013 1158   BILITOT 0.48 06/11/2014 1315   BILITOT 0.8 10/05/2013 1158   GFRNONAA 79* 12/19/2013 0425   GFRAA >90 12/19/2013 0425    I No results found for: SPEP  Lab Results  Component Value Date   WBC 7.5 06/11/2014   NEUTROABS 4.5 06/11/2014   HGB 13.9 06/11/2014   HCT 41.8 06/11/2014   MCV 100.2 06/11/2014   PLT 243 06/11/2014      Chemistry      Component Value Date/Time   NA 142 06/11/2014 1315   NA 136* 12/19/2013 0425   K 3.9 06/11/2014 1315   K 4.7 12/19/2013 0425   CL 103 12/19/2013 0425   CO2 29 06/11/2014 1315   CO2 24 12/19/2013 0425   BUN 20.3 06/11/2014 1315   BUN 17 12/19/2013 0425   CREATININE 0.8 06/11/2014 1315   CREATININE 0.71 12/19/2013 0425      Component Value Date/Time   CALCIUM 8.9 06/11/2014 1315   CALCIUM 8.1* 12/19/2013 0425   ALKPHOS 97 06/11/2014 1315   ALKPHOS 66 10/05/2013 1158   AST 26 06/11/2014 1315   AST 23 10/05/2013 1158   ALT 24 06/11/2014 1315   ALT 17 10/05/2013 1158   BILITOT 0.48 06/11/2014 1315   BILITOT 0.8 10/05/2013 1158       No results found  for: LABCA2  No components found for: LABCA125  No results for  input(s): INR in the last 168 hours.  Urinalysis    Component Value Date/Time   COLORURINE YELLOW 10/05/2013 1158   APPEARANCEUR Cloudy* 10/05/2013 1158   LABSPEC 1.010 10/05/2013 1158   PHURINE 8.5* 10/05/2013 1158   GLUCOSEU NEGATIVE 10/05/2013 1158   HGBUR NEGATIVE 10/05/2013 1158   BILIRUBINUR neg 01/22/2014 1203   BILIRUBINUR NEGATIVE 10/05/2013 1158   KETONESUR NEGATIVE 10/05/2013 1158   PROTEINUR neg 01/22/2014 1203   UROBILINOGEN 0.2 01/22/2014 1203   UROBILINOGEN 0.2 10/05/2013 1158   NITRITE positive 01/22/2014 1203   NITRITE NEGATIVE 10/05/2013 1158   LEUKOCYTESUR small (1+) 01/22/2014 1203    STUDIES: Ct Chest W Contrast  06/27/2014   CLINICAL DATA:  Right breast cancer diagnosed 2015, status post right mastectomy, chemotherapy ongoing. Evaluate known a lung nodules.  EXAM: CT CHEST WITH CONTRAST  TECHNIQUE: Multidetector CT imaging of the chest was performed during intravenous contrast administration.  CONTRAST:  43m OMNIPAQUE IOHEXOL 300 MG/ML  SOLN  COMPARISON:  CT chest dated 11/19/2013  FINDINGS: Mediastinum/Nodes: The heart is normal in size. No pericardial effusion.  Coronary atherosclerosis.  Atherosclerotic calcifications of the aortic arch. Ectasia of the ascending thoracic aorta, measuring 3.5 cm.  No suspicious mediastinal or hilar lymphadenopathy. 5 mm short axis right axillary node (series 2/ image 9), unchanged.  Lungs/Pleura: 4 mm nodule in the right lower lobe (series 5/ image 42), grossly unchanged.  Additional scattered 2-3 mm nodules in the right upper and lower lobes (series 5/image 26, 31, and 32).  Underline mild centrilobular and paraseptal emphysematous changes.  No pleural effusion or pneumothorax.  Upper abdomen: Scattered probable cysts in the liver measuring up to 5 mm (series 2/image 57), unchanged, technically too small to characterize. Calcified splenic granulomata. Vascular calcifications.  Musculoskeletal: Status post right mastectomy.   Degenerative changes of the mid thoracic spine with sclerosis at T7-8.  IMPRESSION: Status post right mastectomy.  No findings specific for metastatic disease in the chest.  Scattered small right lung nodules measuring up to 4 mm, grossly unchanged. This appearance is not suggestive of metastatic disease and is favored to reflect post infectious/ inflammatory scarring. Technically, Fleischner Society guidelines do not apply. Consider at least one additional follow-up to document 12 months of stability.   Electronically Signed   By: SJulian HyM.D.   On: 06/27/2014 17:27    ASSESSMENT: 81y.o. BRCA negative [place] woman status post right breast upper outer quadrant biopsy 11/26/2013 for a clinical T2 N0, stage IIA invasive ductal carcinoma, grade 2, with micropapillary features, triple positive, with an MIB-1 of 31%  (1) right simple mastectomy 12/18/2013 showed a pT2 pNX, stage II invasive ductal carcinoma, grade 2, with negative margins.  (2) 6 mm right lower lobe nodule noted on CT scans 11/19/2013  (a) continuing tobacco abuse-- transitioning to e-cigarettes  (b) emphysema  (c)  Repeat chest CT 06/27/2014 stable  (3) unexplained/ undocumented weight loss   (4) started anastrozole 01/03/2014  (5) started trastuzumab 02/05/2014; to be continued for one year  (a) echo  04/23/2014 shows an EF of 60-65%   (6) tobacco abuse disorder: the patient has been strongly advised to discontinue smoking  PLAN: BKimyahis  Doing well on trastuzumab. She is due for an echocardiogram this month and I am separately entering that order.  It is not clear to me what happened today in the lobby. BHadleihas been here many times and she  knows the process. At any rate I asked her next time if she is waiting more than 10 minutes before receiving a number to go up to our escort and I request some help.   I am concerned that she does not recognize the name "and anastrozole" or is able to tell me how much  she is paying for this medication.  I will ask her to bring all her medications at the next visit, in June, so we can update her list accurately.   We discussed her CT scan results which are very favorable. We should repeat it one more time approximately a year from now  To be 100% sure, but at this point I was able to reassure her  we do not see anything suggestive of metastatic disease.   Chauncey Cruel, MD   07/02/2014 4:49 PM    \

## 2014-07-02 NOTE — Patient Instructions (Signed)
[facility] Discharge Instructions for Patients Receiving Chemotherapy  Today you received the following chemotherapy agents: Herceptin   To help prevent nausea and vomiting after your treatment, we encourage you to take your nausea medication as directed.    If you develop nausea and vomiting that is not controlled by your nausea medication, call the clinic.   BELOW ARE SYMPTOMS THAT SHOULD BE REPORTED IMMEDIATELY:  *FEVER GREATER THAN 100.5 F  *CHILLS WITH OR WITHOUT FEVER  NAUSEA AND VOMITING THAT IS NOT CONTROLLED WITH YOUR NAUSEA MEDICATION  *UNUSUAL SHORTNESS OF BREATH  *UNUSUAL BRUISING OR BLEEDING  TENDERNESS IN MOUTH AND THROAT WITH OR WITHOUT PRESENCE OF ULCERS  *URINARY PROBLEMS  *BOWEL PROBLEMS  UNUSUAL RASH Items with * indicate a potential emergency and should be followed up as soon as possible.  Feel free to call the clinic you have any questions or concerns. The clinic phone number is [phone].  Please show the CHEMO ALERT CARD at check-in to the Emergency Department and triage nurse.   

## 2014-07-03 ENCOUNTER — Telehealth: Payer: Self-pay | Admitting: Oncology

## 2014-07-03 NOTE — Telephone Encounter (Signed)
Left message to confirm ECHO 05/06.

## 2014-07-05 ENCOUNTER — Ambulatory Visit (HOSPITAL_COMMUNITY)
Admission: RE | Admit: 2014-07-05 | Discharge: 2014-07-05 | Disposition: A | Discharge status: Home / Self Care | Payer: Commercial Managed Care - HMO | Source: Ambulatory Visit | Attending: Oncology | Admitting: Oncology

## 2014-07-05 DIAGNOSIS — J439 Emphysema, unspecified: Secondary | ICD-10-CM

## 2014-07-05 DIAGNOSIS — C50411 Malignant neoplasm of upper-outer quadrant of right female breast: Secondary | ICD-10-CM | POA: Insufficient documentation

## 2014-07-05 NOTE — Progress Notes (Signed)
  Echocardiogram 2D Echocardiogram has been performed.  Karlisha Mathena FRANCES 07/05/2014, 2:17 PM

## 2014-07-16 ENCOUNTER — Ambulatory Visit (INDEPENDENT_AMBULATORY_CARE_PROVIDER_SITE_OTHER): Payer: Commercial Managed Care - HMO | Admitting: Ophthalmology

## 2014-07-16 DIAGNOSIS — H3531 Nonexudative age-related macular degeneration: Secondary | ICD-10-CM | POA: Diagnosis not present

## 2014-07-16 DIAGNOSIS — H43812 Vitreous degeneration, left eye: Secondary | ICD-10-CM | POA: Diagnosis not present

## 2014-07-16 DIAGNOSIS — H31011 Macula scars of posterior pole (postinflammatory) (post-traumatic), right eye: Secondary | ICD-10-CM | POA: Diagnosis not present

## 2014-07-16 DIAGNOSIS — H26492 Other secondary cataract, left eye: Secondary | ICD-10-CM | POA: Diagnosis not present

## 2014-07-23 ENCOUNTER — Ambulatory Visit (HOSPITAL_BASED_OUTPATIENT_CLINIC_OR_DEPARTMENT_OTHER): Payer: Commercial Managed Care - HMO

## 2014-07-23 ENCOUNTER — Other Ambulatory Visit (HOSPITAL_BASED_OUTPATIENT_CLINIC_OR_DEPARTMENT_OTHER): Payer: Commercial Managed Care - HMO

## 2014-07-23 VITALS — BP 122/61 | HR 65 | Temp 97.8°F | Resp 18

## 2014-07-23 DIAGNOSIS — F17210 Nicotine dependence, cigarettes, uncomplicated: Secondary | ICD-10-CM

## 2014-07-23 DIAGNOSIS — C50919 Malignant neoplasm of unspecified site of unspecified female breast: Secondary | ICD-10-CM

## 2014-07-23 DIAGNOSIS — C50411 Malignant neoplasm of upper-outer quadrant of right female breast: Secondary | ICD-10-CM | POA: Diagnosis not present

## 2014-07-23 DIAGNOSIS — Z5112 Encounter for antineoplastic immunotherapy: Secondary | ICD-10-CM

## 2014-07-23 LAB — COMPREHENSIVE METABOLIC PANEL (CC13)
ALT: 37 U/L (ref 0–55)
AST: 31 U/L (ref 5–34)
Albumin: 3.4 g/dL — ABNORMAL LOW (ref 3.5–5.0)
Alkaline Phosphatase: 88 U/L (ref 40–150)
Anion Gap: 10 mEq/L (ref 3–11)
BUN: 19.5 mg/dL (ref 7.0–26.0)
CALCIUM: 8.8 mg/dL (ref 8.4–10.4)
CO2: 28 mEq/L (ref 22–29)
CREATININE: 0.8 mg/dL (ref 0.6–1.1)
Chloride: 104 mEq/L (ref 98–109)
EGFR: 70 mL/min/{1.73_m2} — AB (ref >90)
GLUCOSE: 101 mg/dL (ref 70–140)
Potassium: 4 mEq/L (ref 3.5–5.1)
Sodium: 142 mEq/L (ref 136–145)
Total Bilirubin: 0.52 mg/dL (ref 0.20–1.20)
Total Protein: 6.4 g/dL (ref 6.4–8.3)

## 2014-07-23 LAB — CBC WITH DIFFERENTIAL/PLATELET
BASO%: 0.7 % (ref 0.0–2.0)
BASOS ABS: 0.1 10*3/uL (ref 0.0–0.1)
EOS%: 1 % (ref 0.0–7.0)
Eosinophils Absolute: 0.1 10*3/uL (ref 0.0–0.5)
HCT: 42 % (ref 34.8–46.6)
HGB: 14 g/dL (ref 11.6–15.9)
LYMPH%: 26.6 % (ref 14.0–49.7)
MCH: 33.1 pg (ref 25.1–34.0)
MCHC: 33.3 g/dL (ref 31.5–36.0)
MCV: 99.3 fL (ref 79.5–101.0)
MONO#: 0.7 10*3/uL (ref 0.1–0.9)
MONO%: 9.6 % (ref 0.0–14.0)
NEUT%: 62.1 % (ref 38.4–76.8)
NEUTROS ABS: 4.3 10*3/uL (ref 1.5–6.5)
Platelets: 218 10*3/uL (ref 145–400)
RBC: 4.23 10*6/uL (ref 3.70–5.45)
RDW: 13.2 % (ref 11.2–14.5)
WBC: 6.9 10*3/uL (ref 3.9–10.3)
lymph#: 1.8 10*3/uL (ref 0.9–3.3)

## 2014-07-23 MED ORDER — ACETAMINOPHEN 325 MG PO TABS
650.0000 mg | ORAL_TABLET | Freq: Once | ORAL | Status: AC
  Administered 2014-07-23: 650 mg via ORAL

## 2014-07-23 MED ORDER — ACETAMINOPHEN 325 MG PO TABS
ORAL_TABLET | ORAL | Status: AC
  Filled 2014-07-23: qty 2

## 2014-07-23 MED ORDER — SODIUM CHLORIDE 0.9 % IV SOLN
Freq: Once | INTRAVENOUS | Status: AC
  Administered 2014-07-23: 15:00:00 via INTRAVENOUS

## 2014-07-23 MED ORDER — DIPHENHYDRAMINE HCL 25 MG PO CAPS
ORAL_CAPSULE | ORAL | Status: AC
  Filled 2014-07-23: qty 1

## 2014-07-23 MED ORDER — TRASTUZUMAB CHEMO INJECTION 440 MG
6.0000 mg/kg | Freq: Once | INTRAVENOUS | Status: AC
  Administered 2014-07-23: 315 mg via INTRAVENOUS
  Filled 2014-07-23: qty 15

## 2014-07-23 MED ORDER — DIPHENHYDRAMINE HCL 25 MG PO CAPS
25.0000 mg | ORAL_CAPSULE | Freq: Once | ORAL | Status: AC
  Administered 2014-07-23: 25 mg via ORAL

## 2014-07-23 NOTE — Patient Instructions (Signed)
[facility] Discharge Instructions for Patients Receiving Chemotherapy  Today you received the following chemotherapy agents: Herceptin   To help prevent nausea and vomiting after your treatment, we encourage you to take your nausea medication as directed.    If you develop nausea and vomiting that is not controlled by your nausea medication, call the clinic.   BELOW ARE SYMPTOMS THAT SHOULD BE REPORTED IMMEDIATELY:  *FEVER GREATER THAN 100.5 F  *CHILLS WITH OR WITHOUT FEVER  NAUSEA AND VOMITING THAT IS NOT CONTROLLED WITH YOUR NAUSEA MEDICATION  *UNUSUAL SHORTNESS OF BREATH  *UNUSUAL BRUISING OR BLEEDING  TENDERNESS IN MOUTH AND THROAT WITH OR WITHOUT PRESENCE OF ULCERS  *URINARY PROBLEMS  *BOWEL PROBLEMS  UNUSUAL RASH Items with * indicate a potential emergency and should be followed up as soon as possible.  Feel free to call the clinic you have any questions or concerns. The clinic phone number is [phone].  Please show the CHEMO ALERT CARD at check-in to the Emergency Department and triage nurse.   

## 2014-07-25 ENCOUNTER — Encounter: Payer: Self-pay | Admitting: Neurology

## 2014-07-25 ENCOUNTER — Ambulatory Visit (INDEPENDENT_AMBULATORY_CARE_PROVIDER_SITE_OTHER): Payer: Commercial Managed Care - HMO | Admitting: Neurology

## 2014-07-25 VITALS — BP 120/68 | HR 82 | Ht 66.0 in | Wt 113.1 lb

## 2014-07-25 DIAGNOSIS — F0150 Vascular dementia without behavioral disturbance: Secondary | ICD-10-CM

## 2014-07-25 NOTE — Progress Notes (Signed)
Follow-up Visit   Date: 07/25/2014    Gabrielle [name] MRN: [id number] DOB: Nov 25, 1930   Interim History: Gabrielle [name] is a 81 y.o. right-handed Caucasian female with history of tobacco use returning to the clinic for follow-up of memory loss.  The patient was accompanied to the clinic by son who also provides collateral information.    History of present illness: Since January 2015, her son noticed changes in her mother's cognitive ability. She has been having problems with short-term memory and not has "sharp" as she used to be. She is highly functioning at baseline and works part-time at [employer] and has not noticed any problems at work. Problems are mostly with short-term memory such as remembering names, dates, appointments and she has started to write things down more. Her son and daughter have noticed the changes more than patient. Hobbies include reading, working, and lately she has been organizing her ex-husbands home since he passed away last year. They had been divorced for 20+ years.   She is driving without any difficulty. She denies getting lost or being involved in any car accidents. She cooks for herself and eats 2-3 meals per day. Her son says that her response is slower, such as when talking in conversation. Her mood is good. Sleep is fair, averaging 6-7 hours per night. No bizarre or inappropriate behavior changes.  She has not seen a physician in 10-15 years and saw primary care physician on Friday. She was found to have UTI and started on ciprofloxacin (not started taking it yet).    Of note, she has lost 50lb unintentionally in the past 84-months. Appetite remains good.   UPDATE 12/13/2013:  She was found to have right breast cancer and is scheduled to have mastectomy next week.  She is having a harder time learning new tasks and continues to have problems with short-term memory.  She has difficulty maintaining her mail, e-mails, lost her checkbook in her mail, and  daughter says her home is in disarray.  She usually eats about two meals a day, usually gets takeout or eats frozen meals.  Family does not have any concerns with home safety or with her driving. She has not been involved in any motor vehicle accidents. On a rare occasion, she does admit to getting lost.  UPDATE 04/24/2014:  She is accompanied by her son, Gabrielle [name].  She underwent right mastectomy and is now undergoing chemotherapy. Since her last visit, her daughter contacted our office with concerns that patient was ordering $1200 of online magazine which is atypical for her.  Neuropsychology testing showed evidence of vascular dementia (mild).  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  UPDATE 07/25/2014:  She continues to have episodes of forgetfullness.  She called her son in a panic that she lost her wallet, but then found it on the floor.  Her [employer] was late in getting paid and her service was cut.  She is behind on her medical bills.  Her son, Gabrielle [name], is her POA. They are interested in seeking driving assessment.  She is locked out of her facebook and brokerage accounts and accepting calls from people that she is not familiar with.   She stopped working on April 18th, but against medical recommendation, she did continue to file taxes for clients.     Medications:  Current Outpatient Prescriptions on File Prior to Visit  Medication Sig Dispense Refill  . cephALEXin (KEFLEX) 500 MG capsule Take 1 capsule (500 mg total) by mouth 4 (four) times daily. 40 capsule 0  . Cholecalciferol (CVS D3) 5000 UNITS capsule Take 5,000 Units by mouth every other day.     . Cyanocobalamin (VITAMIN B-12 PO) Take 1 drop by mouth daily.     Marland Kitchen donepezil (ARICEPT) 5 MG tablet Take 1 tablet daily for one month, then increase to 2 tablets daily. 60 tablet 5  . fluticasone (FLONASE) 50 MCG/ACT nasal spray     .  HYDROcodone-acetaminophen (NORCO/VICODIN) 5-325 MG per tablet     . Tiotropium Bromide Monohydrate (SPIRIVA RESPIMAT) 2.5 MCG/ACT AERS Inhale 2 puffs into the lungs every morning. @@ 11 am    . vitamin A 10000 UNIT capsule Take 10,000 Units by mouth every morning.      No current facility-administered medications on file prior to visit.    Allergies: No Known Allergies   Review of Systems:  CONSTITUTIONAL: No fevers, chills, night sweats, +weight loss.   EYES: No visual changes or eye pain ENT: No hearing changes.  No history of nose bleeds.   RESPIRATORY: No cough, wheezing and shortness of breath.   CARDIOVASCULAR: Negative for chest pain, and palpitations.   GI: Negative for abdominal discomfort, blood in stools or black stools.  No recent change in bowel habits.   GU:  No history of incontinence.   MUSCLOSKELETAL: No history of joint pain or swelling.  No myalgias.   SKIN: Negative for lesions, rash, and itching.   ENDOCRINE: Negative for cold or heat intolerance, polydipsia or goiter.   PSYCH:  No  depression or anxiety symptoms.   NEURO: As Above.   Vital Signs:  BP 120/68 mmHg  Pulse 82  Ht 5\' 6"  (1.676 m)  Wt 113 lb 2 oz (51.313 kg)  BMI 18.27 kg/m2  SpO2 88%   Neurological Exam:  MENTAL STATUS:   Montreal Cognitive Assessment  07/25/2014 12/13/2013 10/08/2013  Visuospatial/ Executive (0/5) 3 4 2   Naming (0/3) 2 3 2   Attention: Read list of digits (0/2) 2 2 2   Attention: Read list of letters (0/1) 1 1 1   Attention: Serial 7 subtraction starting at 100 (0/3) 3 3 1   Language: Repeat phrase (0/2) 2 2 2   Language : Fluency (0/1) 0 0 0  Abstraction (0/2) 2 2 2   Delayed Recall (0/5) 3 2 3   Orientation (0/6) 6 5 5   Total 24 24 20   Adjusted Score (based on education) 24 25 20    Awake, flat affect, oriented to person, place, date, year  CRANIAL NERVES: Pupils equal round and reactive to light.   Face is symmetric.   MOTOR:  Motor strength is 5-/5 in all extremities.  Tone is normal.    COORDINATION/GAIT:    Stooped posture, gait narrow based and stable.   Data: Lab Results  Component Value Date   [id number]* 12/13/2013   Lab Results  Component Value Date   TSH 1.70 10/05/2013   MRI brain 01/22/2014: No acute infarct. Remote medial right thalamic infarct which was partially hemorrhagic with presence of blood breakdown products. Remote tiny inferior cerebellar infarct. Mild to moderate small vessel disease type changes.  No intracranial mass or bony destructive lesion to suggest the presence of intracranial metastatic disease.  Global atrophy without hydrocephalus.  Right vertebral artery and right posterior inferior cerebellar artery may be occluded.  Neuropsychiatry testing at [facility] January 2016:  Neuropsychology testing showed evidence of  vascular dementia (mild)  IMPRESSION: Ms. Iseman is 81 year old female with returning for evaluation of cognitive impairment, as confirmed by her recent neuropsychological test which showed vascular dementia.  MRI brain was shows global atrophy and small chronic infarct involving the right thalamus. Clinically, she is stable ([place] 24/30 which is the same as October 2015), but she is continuing to be forgetful at home.  I strongly encouraged her son (POA) to have more oversight on her finances.  She has finally stopped working, but did this only after tax season was over.  Against my recommendation, she continues to work even with her known cognitive impairment.  She is living alone and family do not express any concerns with home safety at this time.   PLAN/RECOMMENDATIONS:  1.  Continue Aricept 10mg  daily  2.  Continue aspirin 81mg  daily 3.  Recommend no driving, information on functional driving assessment provided 4.  [facility] referral for social work services for home safety and community resources for dementia patients 5.  Return to clinic in 6-9 months   The duration of this appointment visit  was 30 minutes of face-to-face time with the patient.  Greater than 50% of this time was spent in counseling, explanation of diagnosis, planning of further management, and coordination of care.   Thank you for allowing me to participate in patient's care.  If I can answer any additional questions, I would be pleased to do so.    Sincerely,    Berenize Gatlin K. Posey Pronto, DO

## 2014-07-25 NOTE — Patient Instructions (Addendum)
1.  Functional driving assessment referral 2.  [facility] referral for social worker 3.  Return to clinic in 6-9 months

## 2014-07-30 NOTE — Patient Outreach (Signed)
[facility]) Care Management  07/30/2014  Gabrielle [name] 1930/08/08 [id number]   Referral received from MD, assigned Quinn Plowman, RN.  [name]. [name], [name] [facility] [place] Assistant Phone: [phone] Fax: [phone]

## 2014-08-07 ENCOUNTER — Ambulatory Visit (INDEPENDENT_AMBULATORY_CARE_PROVIDER_SITE_OTHER): Payer: Commercial Managed Care - HMO | Admitting: Ophthalmology

## 2014-08-13 ENCOUNTER — Ambulatory Visit: Payer: Commercial Managed Care - HMO | Admitting: Nurse Practitioner

## 2014-08-13 ENCOUNTER — Ambulatory Visit: Payer: Commercial Managed Care - HMO

## 2014-08-13 ENCOUNTER — Other Ambulatory Visit: Payer: Commercial Managed Care - HMO

## 2014-08-15 ENCOUNTER — Ambulatory Visit (INDEPENDENT_AMBULATORY_CARE_PROVIDER_SITE_OTHER): Payer: Commercial Managed Care - HMO | Admitting: Ophthalmology

## 2014-08-15 ENCOUNTER — Encounter: Payer: Self-pay | Admitting: Nurse Practitioner

## 2014-08-15 ENCOUNTER — Ambulatory Visit (HOSPITAL_BASED_OUTPATIENT_CLINIC_OR_DEPARTMENT_OTHER): Payer: Commercial Managed Care - HMO | Admitting: Nurse Practitioner

## 2014-08-15 ENCOUNTER — Ambulatory Visit (HOSPITAL_BASED_OUTPATIENT_CLINIC_OR_DEPARTMENT_OTHER): Payer: Commercial Managed Care - HMO

## 2014-08-15 ENCOUNTER — Other Ambulatory Visit (HOSPITAL_BASED_OUTPATIENT_CLINIC_OR_DEPARTMENT_OTHER): Payer: Commercial Managed Care - HMO

## 2014-08-15 VITALS — BP 120/60 | HR 70 | Temp 98.0°F | Resp 18 | Ht 66.0 in | Wt 113.5 lb

## 2014-08-15 DIAGNOSIS — H2703 Aphakia, bilateral: Secondary | ICD-10-CM

## 2014-08-15 DIAGNOSIS — Z5112 Encounter for antineoplastic immunotherapy: Secondary | ICD-10-CM

## 2014-08-15 DIAGNOSIS — C50411 Malignant neoplasm of upper-outer quadrant of right female breast: Secondary | ICD-10-CM | POA: Diagnosis not present

## 2014-08-15 DIAGNOSIS — Z171 Estrogen receptor negative status [ER-]: Secondary | ICD-10-CM

## 2014-08-15 DIAGNOSIS — R911 Solitary pulmonary nodule: Secondary | ICD-10-CM | POA: Diagnosis not present

## 2014-08-15 DIAGNOSIS — F17200 Nicotine dependence, unspecified, uncomplicated: Secondary | ICD-10-CM

## 2014-08-15 DIAGNOSIS — R64 Cachexia: Secondary | ICD-10-CM

## 2014-08-15 DIAGNOSIS — R634 Abnormal weight loss: Secondary | ICD-10-CM | POA: Diagnosis not present

## 2014-08-15 DIAGNOSIS — F17210 Nicotine dependence, cigarettes, uncomplicated: Secondary | ICD-10-CM

## 2014-08-15 DIAGNOSIS — C50919 Malignant neoplasm of unspecified site of unspecified female breast: Secondary | ICD-10-CM

## 2014-08-15 LAB — CBC WITH DIFFERENTIAL/PLATELET
BASO%: 0.8 % (ref 0.0–2.0)
Basophils Absolute: 0.1 10*3/uL (ref 0.0–0.1)
EOS ABS: 0.1 10*3/uL (ref 0.0–0.5)
EOS%: 1.6 % (ref 0.0–7.0)
HCT: 41.9 % (ref 34.8–46.6)
HGB: 14.1 g/dL (ref 11.6–15.9)
LYMPH%: 25.1 % (ref 14.0–49.7)
MCH: 33.6 pg (ref 25.1–34.0)
MCHC: 33.7 g/dL (ref 31.5–36.0)
MCV: 99.8 fL (ref 79.5–101.0)
MONO#: 0.7 10*3/uL (ref 0.1–0.9)
MONO%: 8.9 % (ref 0.0–14.0)
NEUT%: 63.6 % (ref 38.4–76.8)
NEUTROS ABS: 4.7 10*3/uL (ref 1.5–6.5)
PLATELETS: 236 10*3/uL (ref 145–400)
RBC: 4.2 10*6/uL (ref 3.70–5.45)
RDW: 13.3 % (ref 11.2–14.5)
WBC: 7.5 10*3/uL (ref 3.9–10.3)
lymph#: 1.9 10*3/uL (ref 0.9–3.3)

## 2014-08-15 MED ORDER — PROCHLORPERAZINE MALEATE 10 MG PO TABS: 10.0000 mg | ORAL_TABLET | Freq: Four times a day (QID) | ORAL | Status: DC | PRN

## 2014-08-15 MED ORDER — TRASTUZUMAB CHEMO INJECTION 440 MG
6.0000 mg/kg | Freq: Once | INTRAVENOUS | Status: AC
  Administered 2014-08-15: 315 mg via INTRAVENOUS
  Filled 2014-08-15: qty 15

## 2014-08-15 MED ORDER — ACETAMINOPHEN 325 MG PO TABS
ORAL_TABLET | ORAL | Status: AC
  Filled 2014-08-15: qty 2

## 2014-08-15 MED ORDER — DIPHENHYDRAMINE HCL 25 MG PO CAPS
25.0000 mg | ORAL_CAPSULE | Freq: Once | ORAL | Status: AC
  Administered 2014-08-15: 25 mg via ORAL

## 2014-08-15 MED ORDER — DIPHENHYDRAMINE HCL 25 MG PO CAPS
ORAL_CAPSULE | ORAL | Status: AC
  Filled 2014-08-15: qty 1

## 2014-08-15 MED ORDER — ACETAMINOPHEN 325 MG PO TABS
650.0000 mg | ORAL_TABLET | Freq: Once | ORAL | Status: AC
  Administered 2014-08-15: 650 mg via ORAL

## 2014-08-15 MED ORDER — SODIUM CHLORIDE 0.9 % IV SOLN
Freq: Once | INTRAVENOUS | Status: AC
  Administered 2014-08-15: 14:00:00 via INTRAVENOUS

## 2014-08-15 NOTE — Progress Notes (Signed)
[place]  Telephone:([phone]) [phone] [phone]) [phone]     ID: Gabrielle [name] DOB: 1930/11/12  MR#: [id number]  OZD#:[id number]  Patient Care Team: Irene Pap, NP as PCP - General (Nurse Practitioner) Excell Seltzer, MD as Consulting Physician (General Surgery) Chauncey Cruel, MD as Consulting Physician (Oncology) Arloa Koh, MD as Consulting Physician (Radiation Oncology) Dannielle Karvonen, RN as Registered Nurse OTHER MD:  CHIEF COMPLAINT: Triple positive breast cancer  CURRENT TREATMENT:  Anastrozole, ttrasztuzumab   BREAST CANCER HISTORY: From the original intake note:  Gabrielle [name] had not been to a doctor for "more than 20 years. Since Christmas 2014 and the family has become increasingly concerned that she appeared to be losing weight and was a bit more confused. They asked the patient how much weight she had lost and she said she had lost "about 50 pounds.". However, the only weight we have available before August of this year is from August of 2014 and it was 115 pounds at that time.  Nevertheless with this concern the patient agreed to be seen by a physician and on 10/06/2013 she was seen by Dr. Kathlen Mody who obtained a full battery of tests. He found the patient to have a urinary tract infection with Escherichia coli, which may explain some of her confusion. Vitamin B 12 level was 215, which is in the low normal range. Hemoglobin was 14.7 with an MCV of 99.0. The patient was started on B12 supplementation parenterally and a chest x-ray was obtained 10/05/2013 which showed evidence of emphysema and prior granulomatous disease. To make sure an occult cancer was not being missed a CT scan of the chest abdomen and pelvis was obtained 11/19/2013. This showed a calcified granuloma in the inferior lingula. There was also a noncalcified 6 mm nodule in the lateral portion of the right lower lobe. There was no mediastinal mass or adenopathy. There were calcified granulomata  in the spleen as well. There was significant atherosclerotic calcifications. There was sigmoid diverticulosis noted incidentally. In addition, a 1.8 cm right breast mass was noted.  The breast mass was evaluated further with bilateral diagnostic mammography and right breast ultrasonography of the breast Center 11/26/2013. The breast density was category C. There was indeed an irregular mass in the right breast which was on palpation firm nontender and noted at the 10:30 position. Ultrasound confirmed a hypoechoic irregular mass measuring up to 3 cm. There was no right axillary adenopathy noted.  Biopsy of the right breast mass in question 11/26/2013 showed ([id number]) and invasive ductal carcinoma, grade 2, estrogen receptor 100% positive, progesterone receptor 59% positive, both with strong staining intensity, with an MIB-1 of 31%, and with HER-2 amplification, the signals ratio being 2.79 and the copy number per cell 5.45.  On 11-2013 the patient underwent bilateral breast MRI. This showed the enhancing mass in the upper-outer quadrant of the right breast to measure 2.1 cm. There was no other findings in the right breast, left breast, or either axilla.  The patient's subsequent history is as detailed below  INTERVAL HISTORY: Gabrielle [name] returns today for follow-up of her breast cancer.  She is due for trastuzumab today. She thinks this is what's causing her lack of appetite. She has been on anastrozole since November 2015 and is tolerating this drug well with no side effects that she is aware of. She has no hot flashes or vaginal changes. She denies pain. The interval history is completely unremarkable.   REVIEW OF SYSTEMS:  Gabrielle [name] denies  fevers, chills, or changes in bowel or bladder habits. She has occasional nausea, even when she thinks about a food that does not agree with her, but her appetite is good. She has shortness of breath and a chronic cough from smoking. She denies headaches, but has  occasional dizziness. A detailed review of systems today was otherwise noncontributory  PAST MEDICAL HISTORY: Past Medical History  Diagnosis Date  . Cataract   . Retina hole   . COPD (chronic obstructive pulmonary disease)   . Macular degeneration   . Anxiety   . Breast cancer of upper-outer quadrant of right female breast   . Complication of anesthesia     nausea and vomiting   . PONV (postoperative nausea and vomiting)   . Shortness of breath     increased activity   . Pneumonia     hx of     PAST SURGICAL HISTORY: Past Surgical History  Procedure Laterality Date  . Cataract extraction Bilateral   . Retinal laser procedure    . Abdominal hysterectomy  1980    (ovaries intact)  . Appendectomy    . Total mastectomy Right 12/18/2013    Procedure: RIGHT TOTAL MASTECTOMY;  Surgeon: Excell Seltzer, MD;  Location: [facility];  Service: General;  Laterality: Right;    FAMILY HISTORY Family History  Problem Relation Age of Onset  . Heart attack Mother     Deceased  . Cancer Mother     fallopian tube cancer in 50s; deceased 30  . Throat cancer Father     Deceased 48; smoker  . Prostate cancer Brother 31    Currently 72  . Other Brother     Deceased, hemorhhage of pancreas  . Leukemia Maternal Aunt   . Breast cancer Paternal Aunt     age at diagnosis unknown  . Throat cancer Paternal Uncle     Deceased 97s; smoker  . Leukemia Paternal Grandmother    the patient's father died at the age of 28 from throat cancer which has been diagnosed to years before. The patient's mother was diagnosed at age 86 with fallopian tube carcinoma. She died at age 60. There is no other history of breast or ovarian cancer in the family to the patient's knowledge  GYNECOLOGIC HISTORY:  No LMP recorded. Patient has had a hysterectomy. Menarche age 80, first live birth age 55. The patient is GX P2. She underwent hysterectomy at a relatively young age, without salpingo-oophorectomy. She did not  take hormone replacement however she took birth control pills remotely for approximately 2 years with no complications.  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 She is divorced. Her son Gabrielle [name] lives in [place] and works as a Metallurgist. Daughter Gabrielle [name] lives in [place] where she works as a Architect for the [employer]. Incidentally Dr. Kathe Becton is a nephew of the patient    ADVANCED DIRECTIVES: In place; the patient's son can is her healthcare power of attorney. He can be reached at [place]: History  Substance Use Topics  . Smoking status: Current Every Day Smoker -- 0.50 packs/day for 40 years    Types: Cigarettes  . Smokeless tobacco: Never Used  . Alcohol Use: 1.2 oz/week    2 Glasses of wine per week     Comment: wine a couple times weekly     Colonoscopy: Never  PAP:  Bone density: 2014  Lipid panel:  No Known Allergies  Current Outpatient Prescriptions  Medication Sig Dispense Refill  . anastrozole (ARIMIDEX) 1 MG tablet     . Cholecalciferol (CVS D3) 5000 UNITS capsule Take 5,000 Units by mouth every other day.     . Cyanocobalamin (VITAMIN B-12 PO) Take 1 drop by mouth daily.     Marland Kitchen donepezil (ARICEPT) 5 MG tablet Take 1 tablet daily for one month, then increase to 2 tablets daily. (Patient taking differently: Take 5 mg by mouth. Pt takes 2 tablets daily.) 60 tablet 5  . fluticasone (FLONASE) 50 MCG/ACT nasal spray Place into both nostrils daily as needed.     . Tiotropium Bromide Monohydrate (SPIRIVA RESPIMAT) 2.5 MCG/ACT AERS Inhale 2 puffs into the lungs every morning. @@ 11 am    . vitamin A 10000 UNIT capsule Take 10,000 Units by mouth every morning.     . prochlorperazine (COMPAZINE) 10 MG tablet Take 1 tablet (10 mg total) by mouth every 6 (six) hours as needed for nausea or vomiting. 30 tablet 0   No current facility-administered medications  for this visit.    OBJECTIVE: Elderly white woman in no acute distress Filed Vitals:   08/15/14 1307  BP: 120/60  Pulse: 70  Temp: 98 F (36.7 C)  Resp: 18     Body mass index is 18.33 kg/(m^2).    ECOG FS:1 - Symptomatic but completely ambulatory   Skin: warm, dry  HEENT: sclerae anicteric, conjunctivae pink, oropharynx clear. No thrush or mucositis.  Lymph Nodes: No cervical or supraclavicular lymphadenopathy  Lungs: clear to auscultation bilaterally, no rales, wheezes, or rhonci  Heart: regular rate and rhythm  Abdomen: round, soft, non tender, positive bowel sounds  Musculoskeletal: No focal spinal tenderness, no peripheral edema  Neuro: non focal, well oriented, positive affect  Breasts: right breast status post mastectomy. No evidence of recurrent disease. Right axilla benign. Left breast unremarkable.  LAB RESULTS:  CMP     Component Value Date/Time   NA 142 07/23/2014 1326   NA 136* 12/19/2013 0425   K 4.0 07/23/2014 1326   K 4.7 12/19/2013 0425   CL 103 12/19/2013 0425   CO2 28 07/23/2014 1326   CO2 24 12/19/2013 0425   GLUCOSE 101 07/23/2014 1326   GLUCOSE 90 12/19/2013 0425   BUN 19.5 07/23/2014 1326   BUN 17 12/19/2013 0425   CREATININE 0.8 07/23/2014 1326   CREATININE 0.71 12/19/2013 0425   CALCIUM 8.8 07/23/2014 1326   CALCIUM 8.1* 12/19/2013 0425   PROT 6.4 07/23/2014 1326   PROT 6.1 10/05/2013 1158   ALBUMIN 3.4* 07/23/2014 1326   ALBUMIN 3.4* 10/05/2013 1158   AST 31 07/23/2014 1326   AST 23 10/05/2013 1158   ALT 37 07/23/2014 1326   ALT 17 10/05/2013 1158   ALKPHOS 88 07/23/2014 1326   ALKPHOS 66 10/05/2013 1158   BILITOT 0.52 07/23/2014 1326   BILITOT 0.8 10/05/2013 1158   GFRNONAA 79* 12/19/2013 0425   GFRAA >90 12/19/2013 0425    I No results found for: SPEP  Lab Results  Component Value Date   WBC 7.5 08/15/2014   NEUTROABS 4.7 08/15/2014   HGB 14.1 08/15/2014   HCT 41.9 08/15/2014   MCV 99.8 08/15/2014   PLT 236 08/15/2014        Chemistry      Component Value Date/Time   NA 142 07/23/2014 1326   NA 136* 12/19/2013 0425   K 4.0 07/23/2014 1326   K 4.7 12/19/2013  0425   CL 103 12/19/2013 0425   CO2 28 07/23/2014 1326   CO2 24 12/19/2013 0425   BUN 19.5 07/23/2014 1326   BUN 17 12/19/2013 0425   CREATININE 0.8 07/23/2014 1326   CREATININE 0.71 12/19/2013 0425      Component Value Date/Time   CALCIUM 8.8 07/23/2014 1326   CALCIUM 8.1* 12/19/2013 0425   ALKPHOS 88 07/23/2014 1326   ALKPHOS 66 10/05/2013 1158   AST 31 07/23/2014 1326   AST 23 10/05/2013 1158   ALT 37 07/23/2014 1326   ALT 17 10/05/2013 1158   BILITOT 0.52 07/23/2014 1326   BILITOT 0.8 10/05/2013 1158       No results found for: LABCA2  No components found for: LABCA125  No results for input(s): INR in the last 168 hours.  Urinalysis    Component Value Date/Time   COLORURINE YELLOW 10/05/2013 1158   APPEARANCEUR Cloudy* 10/05/2013 1158   LABSPEC 1.010 10/05/2013 1158   PHURINE 8.5* 10/05/2013 1158   GLUCOSEU NEGATIVE 10/05/2013 1158   HGBUR NEGATIVE 10/05/2013 1158   BILIRUBINUR neg 01/22/2014 1203   BILIRUBINUR NEGATIVE 10/05/2013 1158   KETONESUR NEGATIVE 10/05/2013 1158   PROTEINUR neg 01/22/2014 1203   UROBILINOGEN 0.2 01/22/2014 1203   UROBILINOGEN 0.2 10/05/2013 1158   NITRITE positive 01/22/2014 1203   NITRITE NEGATIVE 10/05/2013 1158   LEUKOCYTESUR small (1+) 01/22/2014 1203    STUDIES: No results found.  ASSESSMENT: 81 y.o. BRCA negative Gabrielle [name] woman status post right breast upper outer quadrant biopsy 11/26/2013 for a clinical T2 N0, stage IIA invasive ductal carcinoma, grade 2, with micropapillary features, triple positive, with an MIB-1 of 31%  (1) right simple mastectomy 12/18/2013 showed a pT2 pNX, stage II invasive ductal carcinoma, grade 2, with negative margins.  (2) 6 mm right lower lobe nodule noted on CT scans 11/19/2013  (a) continuing tobacco abuse-- transitioning to  e-cigarettes  (b) emphysema  (c)  Repeat chest CT 06/27/2014 stable  (3) unexplained/ undocumented weight loss   (4) started anastrozole 01/03/2014  (5) started trastuzumab 02/05/2014; to be continued for one year  (a) echo  07/05/2014 shows an EF of 60-65%   (6) tobacco abuse disorder: the patient has been strongly advised to discontinue smoking  PLAN: Gabrielle [name] is tolerating her treatments well. The labs were reviewed in detail and were entirely stable. Her most recent echocardiogram showed a well preserved ejection fraction. She will proceed with her next dose of trastuzumab as planned.   We spoke just briefly about her inability to gain weight. She says her appetite is fine, and she refused a megace prescription. She is open to meeting with our nutritionist and looking into protein supplements. I have sent in a prescription for compazine for her infrequent nausea.  Pedro will continue her trastuzumab every 3 weeks. Her next echocardiogram will be due in August. Her next office visit will be in 6 weeks. She understands and agrees with this plan. She knows the goal of treatment in her case is cure. She has been encouraged to call with any issues that might arise before her next visit here.   Laurie Panda, NP   08/15/2014 3:35 PM

## 2014-08-15 NOTE — Patient Instructions (Signed)
[facility] Discharge Instructions for Patients  Today you received the following: Herceptin   To help prevent nausea and vomiting after your treatment, we encourage you to take your nausea medication as directed.   If you develop nausea and vomiting that is not controlled by your nausea medication, call the clinic.   BELOW ARE SYMPTOMS THAT SHOULD BE REPORTED IMMEDIATELY:  *FEVER GREATER THAN 100.5 F  *CHILLS WITH OR WITHOUT FEVER  NAUSEA AND VOMITING THAT IS NOT CONTROLLED WITH YOUR NAUSEA MEDICATION  *UNUSUAL SHORTNESS OF BREATH  *UNUSUAL BRUISING OR BLEEDING  TENDERNESS IN MOUTH AND THROAT WITH OR WITHOUT PRESENCE OF ULCERS  *URINARY PROBLEMS  *BOWEL PROBLEMS  UNUSUAL RASH Items with * indicate a potential emergency and should be followed up as soon as possible.  Feel free to call the clinic you have any questions or concerns. The clinic phone number is [phone].  Please show the CHEMO ALERT CARD at check-in to the Emergency Department and triage nurse.   

## 2014-08-27 ENCOUNTER — Other Ambulatory Visit: Payer: Self-pay

## 2014-08-27 NOTE — Patient Outreach (Signed)
[facility]) Care Management  08/27/2014  Gabrielle [name] 12-22-1930 [id number]  Telephone call to patient regarding primary MD referral.  Unable to reach patient. HIPAA compliant voice message left with call back phone number. RNCM attempted patients son as listed.  Spoke with son, Gabrielle [name] and left HIPAA compliant message with call back contact phone number.  PLAN: RNCM will attempt outreach to patient within 3 business days.  Quinn Plowman RN,BSN,CCM [place] Coordinator [phone]

## 2014-08-28 ENCOUNTER — Other Ambulatory Visit: Payer: Self-pay

## 2014-08-28 ENCOUNTER — Telehealth: Payer: Self-pay | Admitting: Neurology

## 2014-08-28 NOTE — Telephone Encounter (Signed)
Gabrielle [name] with [facility] called and said that pt refused care/[name]

## 2014-08-28 NOTE — Telephone Encounter (Signed)
See [facility] note.

## 2014-08-28 NOTE — Telephone Encounter (Signed)
Noted  

## 2014-08-28 NOTE — Patient Outreach (Signed)
[facility]) Care Management  08/28/2014  NARY SNEED 10/08/1930 [id number]   SUBJECTIVE:  Telephone call to patient's son, Etheline Geppert as requested per primary MD referral.  Mr. Lantier states he contact patient on yesterday 08/27/14 and requested she contact [facility] care management.  Mr. Zelaya states patient has been diagnosed with vascular dementia.  Mr. Hibbitts states patient is perfectly safe moving and functioning in her home but he is concerned about her poor decision making.  Mr. Woo states he is concerned patient is making poor decisions regarding her finances.  Mr. Kithcart states he will contact patient and request patient call [facility] care management to discuss services. RNCM contacted patient.  HIPAA verified with patient. Discussed and offered [facility] care management services to patient. Patient refused services at this time.  Patient verbalized agreement to receive [facility] outreach letter and pamphlet for future reference.  Message left with Dr. Serita Grit office for Caryl Pina [phone]  PLAN: RNCM will notify referring physicians office, Dr. Posey Pronto of patients refusal of services. RNCM will refer to Lurline Del to close due to refusal of services. RNCM will send patient [facility] outreach letter and pamphlet as requested.   Quinn Plowman RN,BSN,CCM [place] Coordinator [phone]

## 2014-09-03 ENCOUNTER — Other Ambulatory Visit: Payer: Commercial Managed Care - HMO

## 2014-09-03 ENCOUNTER — Ambulatory Visit: Payer: Commercial Managed Care - HMO

## 2014-09-05 ENCOUNTER — Encounter: Payer: Self-pay | Admitting: Nurse Practitioner

## 2014-09-05 ENCOUNTER — Ambulatory Visit (HOSPITAL_BASED_OUTPATIENT_CLINIC_OR_DEPARTMENT_OTHER): Payer: Commercial Managed Care - HMO | Admitting: Nurse Practitioner

## 2014-09-05 ENCOUNTER — Ambulatory Visit (HOSPITAL_BASED_OUTPATIENT_CLINIC_OR_DEPARTMENT_OTHER): Payer: Commercial Managed Care - HMO

## 2014-09-05 ENCOUNTER — Other Ambulatory Visit: Payer: Self-pay | Admitting: Oncology

## 2014-09-05 ENCOUNTER — Other Ambulatory Visit (HOSPITAL_BASED_OUTPATIENT_CLINIC_OR_DEPARTMENT_OTHER): Payer: Commercial Managed Care - HMO

## 2014-09-05 VITALS — BP 128/63 | HR 72 | Temp 98.0°F

## 2014-09-05 DIAGNOSIS — C50411 Malignant neoplasm of upper-outer quadrant of right female breast: Secondary | ICD-10-CM

## 2014-09-05 DIAGNOSIS — F17210 Nicotine dependence, cigarettes, uncomplicated: Secondary | ICD-10-CM

## 2014-09-05 DIAGNOSIS — R21 Rash and other nonspecific skin eruption: Secondary | ICD-10-CM

## 2014-09-05 DIAGNOSIS — C50919 Malignant neoplasm of unspecified site of unspecified female breast: Secondary | ICD-10-CM

## 2014-09-05 DIAGNOSIS — Z5112 Encounter for antineoplastic immunotherapy: Secondary | ICD-10-CM

## 2014-09-05 DIAGNOSIS — S50861A Insect bite (nonvenomous) of right forearm, initial encounter: Secondary | ICD-10-CM | POA: Diagnosis not present

## 2014-09-05 DIAGNOSIS — S1096XA Insect bite of unspecified part of neck, initial encounter: Secondary | ICD-10-CM

## 2014-09-05 DIAGNOSIS — W57XXXA Bitten or stung by nonvenomous insect and other nonvenomous arthropods, initial encounter: Secondary | ICD-10-CM

## 2014-09-05 LAB — CBC WITH DIFFERENTIAL/PLATELET
BASO%: 0.6 % (ref 0.0–2.0)
Basophils Absolute: 0 10*3/uL (ref 0.0–0.1)
EOS%: 1.2 % (ref 0.0–7.0)
Eosinophils Absolute: 0.1 10*3/uL (ref 0.0–0.5)
HCT: 42.3 % (ref 34.8–46.6)
HEMOGLOBIN: 14.5 g/dL (ref 11.6–15.9)
LYMPH#: 1.8 10*3/uL (ref 0.9–3.3)
LYMPH%: 27.5 % (ref 14.0–49.7)
MCH: 33.9 pg (ref 25.1–34.0)
MCHC: 34.3 g/dL (ref 31.5–36.0)
MCV: 98.8 fL (ref 79.5–101.0)
MONO#: 0.6 10*3/uL (ref 0.1–0.9)
MONO%: 9.5 % (ref 0.0–14.0)
NEUT#: 4 10*3/uL (ref 1.5–6.5)
NEUT%: 61.2 % (ref 38.4–76.8)
Platelets: 233 10*3/uL (ref 145–400)
RBC: 4.28 10*6/uL (ref 3.70–5.45)
RDW: 13 % (ref 11.2–14.5)
WBC: 6.6 10*3/uL (ref 3.9–10.3)

## 2014-09-05 LAB — COMPREHENSIVE METABOLIC PANEL (CC13)
ALBUMIN: 3.6 g/dL (ref 3.5–5.0)
ALT: 30 U/L (ref 0–55)
AST: 29 U/L (ref 5–34)
Alkaline Phosphatase: 85 U/L (ref 40–150)
Anion Gap: 7 mEq/L (ref 3–11)
BUN: 20.9 mg/dL (ref 7.0–26.0)
CALCIUM: 9.5 mg/dL (ref 8.4–10.4)
CHLORIDE: 104 meq/L (ref 98–109)
CO2: 27 mEq/L (ref 22–29)
Creatinine: 0.8 mg/dL (ref 0.6–1.1)
EGFR: 65 mL/min/{1.73_m2} — ABNORMAL LOW (ref >90)
GLUCOSE: 99 mg/dL (ref 70–140)
Potassium: 4.4 mEq/L (ref 3.5–5.1)
Sodium: 139 mEq/L (ref 136–145)
Total Bilirubin: 0.58 mg/dL (ref 0.20–1.20)
Total Protein: 6.6 g/dL (ref 6.4–8.3)

## 2014-09-05 MED ORDER — ACETAMINOPHEN 325 MG PO TABS
650.0000 mg | ORAL_TABLET | Freq: Once | ORAL | Status: AC
  Administered 2014-09-05: 650 mg via ORAL

## 2014-09-05 MED ORDER — ACETAMINOPHEN 325 MG PO TABS
ORAL_TABLET | ORAL | Status: AC
  Filled 2014-09-05: qty 2

## 2014-09-05 MED ORDER — SODIUM CHLORIDE 0.9 % IV SOLN
Freq: Once | INTRAVENOUS | Status: AC
  Administered 2014-09-05: 14:00:00 via INTRAVENOUS

## 2014-09-05 MED ORDER — TRASTUZUMAB CHEMO INJECTION 440 MG
6.0000 mg/kg | Freq: Once | INTRAVENOUS | Status: AC
  Administered 2014-09-05: 315 mg via INTRAVENOUS
  Filled 2014-09-05: qty 15

## 2014-09-05 MED ORDER — DIPHENHYDRAMINE HCL 25 MG PO CAPS
ORAL_CAPSULE | ORAL | Status: AC
  Filled 2014-09-05: qty 1

## 2014-09-05 MED ORDER — DIPHENHYDRAMINE HCL 25 MG PO CAPS
25.0000 mg | ORAL_CAPSULE | Freq: Once | ORAL | Status: AC
  Administered 2014-09-05: 25 mg via ORAL

## 2014-09-05 NOTE — Assessment & Plan Note (Signed)
Pt c/o mild peeling of bilat hands; especially to her fingers. Denies any new lotions, shampoos, etc.  On exam- no erythema or infection noted.  Advised pt to try aquaphor samples to hands as directed to see if that helps. Advised pt f/u with her PCP if symptoms persist or worsen.

## 2014-09-05 NOTE — Progress Notes (Signed)
Patient complains of blistering on bilateral hands. Patient noted to have red raised area on her left arm and right posterior neck. Patient denies pain or itching at these sites. Selena Lesser, NP notified and chairside.

## 2014-09-05 NOTE — Assessment & Plan Note (Signed)
Pt has 2 insect bites to her right forearm; and 1 insect bite to her right posterior neck.  She denies any itching or pain to the sites. No surrounding erythema, edema, warmth, tenderness, or red streaks.   Pt states that she has been around a new dog this past weekend; and she may have some flea bites from the dog?   Advised pt to try benadryl and pepcid to help stop any further issues with insect bites.  Also, advised pt to call/return or go directly to the ED for any worsening symptoms whatsoever.

## 2014-09-05 NOTE — Progress Notes (Signed)
SYMPTOM MANAGEMENT CLINIC   HPI: Gabrielle [name] 81 y.o. female diagnosed with breast cancer.  Currently undergoing herceptin infusions.  Pt has 2 insect bites to her right forearm; and 1 insect bite to her right posterior neck.  She denies any itching or pain to the sites. No surrounding erythema, edema, warmth, tenderness, or red streaks.   Pt states that she has been around a new dog this past weekend; and she may have some flea bites from the dog?   Also c/o some peeling hands bilat.   HPI  ROS  Past Medical History  Diagnosis Date  . Cataract   . Retina hole   . COPD (chronic obstructive pulmonary disease)   . Macular degeneration   . Anxiety   . Breast cancer of upper-outer quadrant of right female breast   . Complication of anesthesia     nausea and vomiting   . PONV (postoperative nausea and vomiting)   . Shortness of breath     increased activity   . Pneumonia     hx of     Past Surgical History  Procedure Laterality Date  . Cataract extraction Bilateral   . Retinal laser procedure    . Abdominal hysterectomy  1980    (ovaries intact)  . Appendectomy    . Total mastectomy Right 12/18/2013    Procedure: RIGHT TOTAL MASTECTOMY;  Surgeon: Excell Seltzer, MD;  Location: [facility] ORS;  Service: General;  Laterality: Right;    has Loss of weight; Dizzy; Swelling of both lower extremities; Cigarette smoker; Malodorous urine; Forgetfulness; Vitamin B12 deficiency without anemia; Vitamin D deficiency; Emphysema of lung; Breast mass, right; Lung nodule, solitary; Breast cancer of upper-outer quadrant of right female breast; Cough; COPD, moderate; Preoperative respiratory examination; Smoking; Nodule of right lung; Cachexia; Cancer of breast, female; Skin lesion of face; Urinary tract infectious disease; Postoperative wound infection; Recurrent UTI; Insect bite; and Rash on her problem list.    has No Known Allergies.    Medication List       This list is accurate as  of: 09/05/14  4:38 PM.  Always use your most recent med list.               anastrozole 1 MG tablet  Commonly known as:  ARIMIDEX     [employer] D3 5000 UNITS capsule  Generic drug:  Cholecalciferol  Take 5,000 Units by mouth every other day.     donepezil 5 MG tablet  Commonly known as:  ARICEPT  Take 1 tablet daily for one month, then increase to 2 tablets daily.     fluticasone 50 MCG/ACT nasal spray  Commonly known as:  FLONASE  Place into both nostrils daily as needed.     prochlorperazine 10 MG tablet  Commonly known as:  COMPAZINE  Take 1 tablet (10 mg total) by mouth every 6 (six) hours as needed for nausea or vomiting.     SPIRIVA RESPIMAT 2.5 MCG/ACT Aers  Generic drug:  Tiotropium Bromide Monohydrate  Inhale 2 puffs into the lungs every morning. @@ 11 am     vitamin A 10000 UNIT capsule  Take 10,000 Units by mouth every morning.     VITAMIN B-12 PO  Take 1 drop by mouth daily.         PHYSICAL EXAMINATION  Oncology Vitals 09/05/2014 08/15/2014 07/25/2014 07/23/2014 07/02/2014 05/21/2014 04/30/2014  Height - 168 cm 168 cm - 168 cm - -  Weight - 51.483 kg 51.313 kg -  51.302 kg - -  Weight (lbs) - 113 lbs 8 oz 113 lbs 2 oz - 113 lbs 2 oz - -  BMI (kg/m2) - 18.32 kg/m2 18.26 kg/m2 - 18.25 kg/m2 - -  Temp 98 98 - 97.8 98 98.2 98.2  Pulse 72 70 82 65 61 62 72  Resp - 18 - '18 18 16 16  ' SpO2 - 98 88 99 - 100 -  BSA (m2) - 1.55 m2 1.55 m2 - 1.55 m2 - -   BP Readings from Last 3 Encounters:  09/05/14 128/63  08/15/14 120/60  07/25/14 120/68    Physical Exam  Constitutional: She is oriented to person, place, and time and well-developed, well-nourished, and in no distress.  HENT:  Head: Normocephalic and atraumatic.  Eyes: Conjunctivae and EOM are normal. Pupils are equal, round, and reactive to light. Right eye exhibits no discharge. Left eye exhibits no discharge. No scleral icterus.  Neck: Normal range of motion.  Pulmonary/Chest: Effort normal. No respiratory distress.    Musculoskeletal: Normal range of motion.  Neurological: She is alert and oriented to person, place, and time.  Skin: Skin is warm and dry. No rash noted. No erythema. No pallor.  Pt has 2 insect bites to her right forearm; and 1 insect bite to her right posterior neck.  She denies any itching or pain to the sites. No surrounding erythema, edema, warmth, tenderness, or red streaks.   Mild peeling of bilat hands; especially to fingers. No infection noted.   Psychiatric: Affect normal.  Nursing note and vitals reviewed.   LABORATORY DATA:. Appointment on 09/05/2014  Component Date Value Ref Range Status  . WBC 09/05/2014 6.6  3.9 - 10.3 10e3/uL Final  . NEUT# 09/05/2014 4.0  1.5 - 6.5 10e3/uL Final  . HGB 09/05/2014 14.5  11.6 - 15.9 g/dL Final  . HCT 09/05/2014 42.3  34.8 - 46.6 % Final  . Platelets 09/05/2014 233  145 - 400 10e3/uL Final  . MCV 09/05/2014 98.8  79.5 - 101.0 fL Final  . MCH 09/05/2014 33.9  25.1 - 34.0 pg Final  . MCHC 09/05/2014 34.3  31.5 - 36.0 g/dL Final  . RBC 09/05/2014 4.28  3.70 - 5.45 10e6/uL Final  . RDW 09/05/2014 13.0  11.2 - 14.5 % Final  . lymph# 09/05/2014 1.8  0.9 - 3.3 10e3/uL Final  . MONO# 09/05/2014 0.6  0.1 - 0.9 10e3/uL Final  . Eosinophils Absolute 09/05/2014 0.1  0.0 - 0.5 10e3/uL Final  . Basophils Absolute 09/05/2014 0.0  0.0 - 0.1 10e3/uL Final  . NEUT% 09/05/2014 61.2  38.4 - 76.8 % Final  . LYMPH% 09/05/2014 27.5  14.0 - 49.7 % Final  . MONO% 09/05/2014 9.5  0.0 - 14.0 % Final  . EOS% 09/05/2014 1.2  0.0 - 7.0 % Final  . BASO% 09/05/2014 0.6  0.0 - 2.0 % Final  . Sodium 09/05/2014 139  136 - 145 mEq/L Final  . Potassium 09/05/2014 4.4  3.5 - 5.1 mEq/L Final  . Chloride 09/05/2014 104  98 - 109 mEq/L Final  . CO2 09/05/2014 27  22 - 29 mEq/L Final  . Glucose 09/05/2014 99  70 - 140 mg/dl Final  . BUN 09/05/2014 20.9  7.0 - 26.0 mg/dL Final  . Creatinine 09/05/2014 0.8  0.6 - 1.1 mg/dL Final  . Total Bilirubin 09/05/2014 0.58  0.20 -  1.20 mg/dL Final  . Alkaline Phosphatase 09/05/2014 85  40 - 150 U/L Final  . AST 09/05/2014 29  5 - 34 U/L Final  . ALT 09/05/2014 30  0 - 55 U/L Final  . Total Protein 09/05/2014 6.6  6.4 - 8.3 g/dL Final  . Albumin 09/05/2014 3.6  3.5 - 5.0 g/dL Final  . Calcium 09/05/2014 9.5  8.4 - 10.4 mg/dL Final  . Anion Gap 09/05/2014 7  3 - 11 mEq/L Final  . EGFR 09/05/2014 65* >90 ml/min/1.73 m2 Final   eGFR is calculated using the CKD-EPI Creatinine Equation (2009)   Left forearm:     Right posterior neck:     RADIOGRAPHIC STUDIES: No results found.  ASSESSMENT/PLAN:    Breast cancer of upper-outer quadrant of right female breast Pt presented today for Herceptin infusion.  All labs remain stable. Pt scheduled to return 09/26/14 for labs, visit, and next herceptin.   Insect bite Pt has 2 insect bites to her right forearm; and 1 insect bite to her right posterior neck.  She denies any itching or pain to the sites. No surrounding erythema, edema, warmth, tenderness, or red streaks.   Pt states that she has been around a new dog this past weekend; and she may have some flea bites from the dog?   Advised pt to try benadryl and pepcid to help stop any further issues with insect bites.  Also, advised pt to call/return or go directly to the ED for any worsening symptoms whatsoever.   Rash Pt c/o mild peeling of bilat hands; especially to her fingers. Denies any new lotions, shampoos, etc.  On exam- no erythema or infection noted.  Advised pt to try aquaphor samples to hands as directed to see if that helps. Advised pt f/u with her PCP if symptoms persist or worsen.    Patient stated understanding of all instructions; and was in agreement with this plan of care. The patient knows to call the clinic with any problems, questions or concerns.   Review/collaboration with Dr. Jana Hakim regarding all aspects of patient's visit today.   Total time spent with patient was 25 minutes;  with greater  than 75 percent of that time spent in face to face counseling regarding patient's symptoms,  and coordination of care and follow up.  Disclaimer: This note was dictated with voice recognition software. Similar sounding words can inadvertently be transcribed and may not be corrected upon review.   Drue Second, NP 09/05/2014

## 2014-09-05 NOTE — Patient Instructions (Signed)
[facility] Discharge Instructions for Patients Receiving Chemotherapy  Today you received the following chemotherapy agents:  Herceptin  To help prevent nausea and vomiting after your treatment, we encourage you to take your nausea medication as prescribed.   If you develop nausea and vomiting that is not controlled by your nausea medication, call the clinic.   BELOW ARE SYMPTOMS THAT SHOULD BE REPORTED IMMEDIATELY:  *FEVER GREATER THAN 100.5 F  *CHILLS WITH OR WITHOUT FEVER  NAUSEA AND VOMITING THAT IS NOT CONTROLLED WITH YOUR NAUSEA MEDICATION  *UNUSUAL SHORTNESS OF BREATH  *UNUSUAL BRUISING OR BLEEDING  TENDERNESS IN MOUTH AND THROAT WITH OR WITHOUT PRESENCE OF ULCERS  *URINARY PROBLEMS  *BOWEL PROBLEMS  UNUSUAL RASH Items with * indicate a potential emergency and should be followed up as soon as possible.  Feel free to call the clinic you have any questions or concerns. The clinic phone number is [phone].  Please show the CHEMO ALERT CARD at check-in to the Emergency Department and triage nurse.   

## 2014-09-05 NOTE — Assessment & Plan Note (Signed)
Pt presented today for Herceptin infusion.  All labs remain stable. Pt scheduled to return 09/26/14 for labs, visit, and next herceptin.

## 2014-09-09 ENCOUNTER — Telehealth: Payer: Self-pay | Admitting: *Deleted

## 2014-09-09 NOTE — Telephone Encounter (Signed)
LM for rtn call to check status of pt following visit with Selena Lesser, FNP on 7/7

## 2014-09-26 ENCOUNTER — Ambulatory Visit (HOSPITAL_BASED_OUTPATIENT_CLINIC_OR_DEPARTMENT_OTHER): Payer: Commercial Managed Care - HMO

## 2014-09-26 ENCOUNTER — Encounter: Payer: Self-pay | Admitting: Nurse Practitioner

## 2014-09-26 ENCOUNTER — Ambulatory Visit (HOSPITAL_BASED_OUTPATIENT_CLINIC_OR_DEPARTMENT_OTHER): Payer: Commercial Managed Care - HMO | Admitting: Nurse Practitioner

## 2014-09-26 ENCOUNTER — Other Ambulatory Visit (HOSPITAL_BASED_OUTPATIENT_CLINIC_OR_DEPARTMENT_OTHER): Payer: Commercial Managed Care - HMO

## 2014-09-26 VITALS — BP 136/53 | HR 90 | Temp 97.9°F | Resp 18 | Ht 66.0 in | Wt 110.5 lb

## 2014-09-26 DIAGNOSIS — F17210 Nicotine dependence, cigarettes, uncomplicated: Secondary | ICD-10-CM

## 2014-09-26 DIAGNOSIS — Z5112 Encounter for antineoplastic immunotherapy: Secondary | ICD-10-CM

## 2014-09-26 DIAGNOSIS — C50919 Malignant neoplasm of unspecified site of unspecified female breast: Secondary | ICD-10-CM

## 2014-09-26 DIAGNOSIS — R634 Abnormal weight loss: Secondary | ICD-10-CM

## 2014-09-26 DIAGNOSIS — C50411 Malignant neoplasm of upper-outer quadrant of right female breast: Secondary | ICD-10-CM

## 2014-09-26 LAB — CBC WITH DIFFERENTIAL/PLATELET
BASO%: 0.9 % (ref 0.0–2.0)
Basophils Absolute: 0.1 10*3/uL (ref 0.0–0.1)
EOS%: 1.1 % (ref 0.0–7.0)
Eosinophils Absolute: 0.1 10*3/uL (ref 0.0–0.5)
HCT: 45.7 % (ref 34.8–46.6)
HEMOGLOBIN: 15.1 g/dL (ref 11.6–15.9)
LYMPH#: 1.4 10*3/uL (ref 0.9–3.3)
LYMPH%: 22 % (ref 14.0–49.7)
MCH: 33 pg (ref 25.1–34.0)
MCHC: 33 g/dL (ref 31.5–36.0)
MCV: 99.9 fL (ref 79.5–101.0)
MONO#: 0.6 10*3/uL (ref 0.1–0.9)
MONO%: 9.8 % (ref 0.0–14.0)
NEUT#: 4.2 10*3/uL (ref 1.5–6.5)
NEUT%: 66.2 % (ref 38.4–76.8)
Platelets: 238 10*3/uL (ref 145–400)
RBC: 4.57 10*6/uL (ref 3.70–5.45)
RDW: 12.9 % (ref 11.2–14.5)
WBC: 6.3 10*3/uL (ref 3.9–10.3)

## 2014-09-26 MED ORDER — TRASTUZUMAB CHEMO INJECTION 440 MG
6.0000 mg/kg | Freq: Once | INTRAVENOUS | Status: AC
  Administered 2014-09-26: 315 mg via INTRAVENOUS
  Filled 2014-09-26: qty 15

## 2014-09-26 MED ORDER — SODIUM CHLORIDE 0.9 % IV SOLN
Freq: Once | INTRAVENOUS | Status: AC
  Administered 2014-09-26: 15:00:00 via INTRAVENOUS

## 2014-09-26 MED ORDER — DIPHENHYDRAMINE HCL 25 MG PO CAPS
ORAL_CAPSULE | ORAL | Status: AC
  Filled 2014-09-26: qty 1

## 2014-09-26 MED ORDER — DIPHENHYDRAMINE HCL 25 MG PO CAPS
25.0000 mg | ORAL_CAPSULE | Freq: Once | ORAL | Status: AC
  Administered 2014-09-26: 25 mg via ORAL

## 2014-09-26 MED ORDER — ACETAMINOPHEN 325 MG PO TABS
650.0000 mg | ORAL_TABLET | Freq: Once | ORAL | Status: AC
  Administered 2014-09-26: 650 mg via ORAL

## 2014-09-26 MED ORDER — ACETAMINOPHEN 325 MG PO TABS
ORAL_TABLET | ORAL | Status: AC
  Filled 2014-09-26: qty 2

## 2014-09-26 NOTE — Progress Notes (Signed)
[place]  [phone] [phone]     ID: KENNISHA QIN DOB: 05-Oct-1930  MR#: [id number]  QJJ#:[id number]  Patient Care Team: Irene Pap, NP as PCP - General (Nurse Practitioner) Excell Seltzer, MD as Consulting Physician (General Surgery) Chauncey Cruel, MD as Consulting Physician (Oncology) Arloa Koh, MD as Consulting Physician (Radiation Oncology) Dannielle Karvonen, RN as Registered Nurse OTHER MD:  CHIEF COMPLAINT: Triple positive breast cancer  CURRENT TREATMENT:  Anastrozole, ttrasztuzumab  BREAST CANCER HISTORY: From the original intake note:  Marveline had not been to a doctor for "more than 20 years. Since Christmas 2014 and the family has become increasingly concerned that she appeared to be losing weight and was a bit more confused. They asked the patient how much weight she had lost and she said she had lost "about 50 pounds.". However, the only weight we have available before August of this year is from August of 2014 and it was 115 pounds at that time.  Nevertheless with this concern the patient agreed to be seen by a physician and on 10/06/2013 she was seen by Dr. Kathlen Mody who obtained a full battery of tests. He found the patient to have a urinary tract infection with Escherichia coli, which may explain some of her confusion. Vitamin B 12 level was 215, which is in the low normal range. Hemoglobin was 14.7 with an MCV of 99.0. The patient was started on B12 supplementation parenterally and a chest x-ray was obtained 10/05/2013 which showed evidence of emphysema and prior granulomatous disease. To make sure an occult cancer was not being missed a CT scan of the chest abdomen and pelvis was obtained 11/19/2013. This showed a calcified granuloma in the inferior lingula. There was also a noncalcified 6 mm nodule in the lateral portion of the right lower lobe. There was no mediastinal mass or adenopathy. There were calcified granulomata in  the spleen as well. There was significant atherosclerotic calcifications. There was sigmoid diverticulosis noted incidentally. In addition, a 1.8 cm right breast mass was noted.  The breast mass was evaluated further with bilateral diagnostic mammography and right breast ultrasonography of the breast Center 11/26/2013. The breast density was category C. There was indeed an irregular mass in the right breast which was on palpation firm nontender and noted at the 10:30 position. Ultrasound confirmed a hypoechoic irregular mass measuring up to 3 cm. There was no right axillary adenopathy noted.  Biopsy of the right breast mass in question 11/26/2013 showed ([id number]) and invasive ductal carcinoma, grade 2, estrogen receptor 100% positive, progesterone receptor 59% positive, both with strong staining intensity, with an MIB-1 of 31%, and with HER-2 amplification, the signals ratio being 2.79 and the copy number per cell 5.45.  On 11-2013 the patient underwent bilateral breast MRI. This showed the enhancing mass in the upper-outer quadrant of the right breast to measure 2.1 cm. There was no other findings in the right breast, left breast, or either axilla.  The patient's subsequent history is as detailed below  INTERVAL HISTORY: Lakeita returns today for follow-up of her breast cancer. She continues on anastrozole daily and tolerates this well with no side effects that she is aware of. She is due for trastuzumab today. The interval history is completely unremarkable.  REVIEW OF SYSTEMS:  Aleysha denies fevers, chills, or changes in bowel or bladder habits. Her appetite is fair but she has lost 3lb since last month. She stopped doing her supplement shakes. She denies  nausea or vomiting. She eats about 2 meals daily, sometimes 3. She is short of breath with exertion and has a chronic cough from smoking that is occasionally productive. She does not have plans to quit. She has occasional palpitations. She  denies headaches, dizziness, unexplained weight loss, or fatigue. A detailed review of systems is otherwise stable.   PAST MEDICAL HISTORY: Past Medical History  Diagnosis Date  . Cataract   . Retina hole   . COPD (chronic obstructive pulmonary disease)   . Macular degeneration   . Anxiety   . Breast cancer of upper-outer quadrant of right female breast   . Complication of anesthesia     nausea and vomiting   . PONV (postoperative nausea and vomiting)   . Shortness of breath     increased activity   . Pneumonia     hx of     PAST SURGICAL HISTORY: Past Surgical History  Procedure Laterality Date  . Cataract extraction Bilateral   . Retinal laser procedure    . Abdominal hysterectomy  1980    (ovaries intact)  . Appendectomy    . Total mastectomy Right 12/18/2013    Procedure: RIGHT TOTAL MASTECTOMY;  Surgeon: Excell Seltzer, MD;  Location: [facility];  Service: General;  Laterality: Right;    FAMILY HISTORY Family History  Problem Relation Age of Onset  . Heart attack Mother     Deceased  . Cancer Mother     fallopian tube cancer in 90s; deceased 11  . Throat cancer Father     Deceased 33; smoker  . Prostate cancer Brother 72    Currently 79  . Other Brother     Deceased, hemorhhage of pancreas  . Leukemia Maternal Aunt   . Breast cancer Paternal Aunt     age at diagnosis unknown  . Throat cancer Paternal Uncle     Deceased 32s; smoker  . Leukemia Paternal Grandmother    the patient's father died at the age of 40 from throat cancer which has been diagnosed to years before. The patient's mother was diagnosed at age 64 with fallopian tube carcinoma. She died at age 35. There is no other history of breast or ovarian cancer in the family to the patient's knowledge  GYNECOLOGIC HISTORY:  No LMP recorded. Patient has had a hysterectomy. Menarche age 81, first live birth age 31. The patient is GX P2. She underwent hysterectomy at a relatively young age, without  salpingo-oophorectomy. She did not take hormone replacement however she took birth control pills remotely for approximately 2 years with no complications.  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 She is divorced. Her son Mariely Mahr lives in [place] and works as a Metallurgist. Daughter Kaleb Linquist lives in [place] where she works as a Architect for the [employer]. Incidentally Dr. Kathe Becton is a nephew of the patient    ADVANCED DIRECTIVES: In place; the patient's son can is her healthcare power of attorney. He can be reached at [place]: History  Substance Use Topics  . Smoking status: Current Every Day Smoker -- 0.50 packs/day for 40 years    Types: Cigarettes  . Smokeless tobacco: Never Used  . Alcohol Use: 1.2 oz/week    2 Glasses of wine per week     Comment: wine a couple times weekly     Colonoscopy: Never  PAP:  Bone density: 2014  Lipid panel:  No Known Allergies  Current Outpatient Prescriptions  Medication Sig Dispense Refill  . anastrozole (ARIMIDEX) 1 MG tablet     . Cholecalciferol ([employer] D3) 5000 UNITS capsule Take 5,000 Units by mouth every other day.     . Cyanocobalamin (VITAMIN B-12 PO) Take 1 drop by mouth daily.     Marland Kitchen donepezil (ARICEPT) 5 MG tablet Take 1 tablet daily for one month, then increase to 2 tablets daily. (Patient taking differently: Take 5 mg by mouth. Pt takes 2 tablets daily.) 60 tablet 5  . fluticasone (FLONASE) 50 MCG/ACT nasal spray Place into both nostrils daily as needed.     . prochlorperazine (COMPAZINE) 10 MG tablet Take 1 tablet (10 mg total) by mouth every 6 (six) hours as needed for nausea or vomiting. 30 tablet 0  . Tiotropium Bromide Monohydrate (SPIRIVA RESPIMAT) 2.5 MCG/ACT AERS Inhale 2 puffs into the lungs every morning. @@ 11 am    . vitamin A 10000 UNIT capsule Take 10,000 Units by mouth every morning.      No current  facility-administered medications for this visit.    OBJECTIVE: Elderly white woman in no acute distress Filed Vitals:   09/26/14 1455  BP: 136/53  Pulse: 90  Temp: 97.9 F (36.6 C)  Resp: 18     Body mass index is 17.84 kg/(m^2).    ECOG FS:1 - Symptomatic but completely ambulatory   Sclerae unicteric, pupils round and equal Oropharynx clear and moist-- no thrush or other lesions No cervical or supraclavicular adenopathy Lungs no rales or rhonchi Heart regular rate and rhythm Abd soft, nontender, positive bowel sounds MSK no focal spinal tenderness, no upper extremity lymphedema Neuro: nonfocal, well oriented, appropriate affect Breasts: right breast status post mastectomy. No evidence of recurrent disease. Right axilla benign. Left breast unremarkable.  LAB RESULTS:  CMP     Component Value Date/Time   NA 139 09/05/2014 1401   NA 136* 12/19/2013 0425   K 4.4 09/05/2014 1401   K 4.7 12/19/2013 0425   CL 103 12/19/2013 0425   CO2 27 09/05/2014 1401   CO2 24 12/19/2013 0425   GLUCOSE 99 09/05/2014 1401   GLUCOSE 90 12/19/2013 0425   BUN 20.9 09/05/2014 1401   BUN 17 12/19/2013 0425   CREATININE 0.8 09/05/2014 1401   CREATININE 0.71 12/19/2013 0425   CALCIUM 9.5 09/05/2014 1401   CALCIUM 8.1* 12/19/2013 0425   PROT 6.6 09/05/2014 1401   PROT 6.1 10/05/2013 1158   ALBUMIN 3.6 09/05/2014 1401   ALBUMIN 3.4* 10/05/2013 1158   AST 29 09/05/2014 1401   AST 23 10/05/2013 1158   ALT 30 09/05/2014 1401   ALT 17 10/05/2013 1158   ALKPHOS 85 09/05/2014 1401   ALKPHOS 66 10/05/2013 1158   BILITOT 0.58 09/05/2014 1401   BILITOT 0.8 10/05/2013 1158   GFRNONAA 79* 12/19/2013 0425   GFRAA >90 12/19/2013 0425    I No results found for: SPEP  Lab Results  Component Value Date   WBC 6.3 09/26/2014   NEUTROABS 4.2 09/26/2014   HGB 15.1 09/26/2014   HCT 45.7 09/26/2014   MCV 99.9 09/26/2014   PLT 238 09/26/2014      Chemistry      Component Value Date/Time   NA  139 09/05/2014 1401   NA 136* 12/19/2013 0425   K 4.4 09/05/2014 1401   K 4.7 12/19/2013 0425   CL 103 12/19/2013 0425   CO2 27 09/05/2014 1401   CO2 24  12/19/2013 0425   BUN 20.9 09/05/2014 1401   BUN 17 12/19/2013 0425   CREATININE 0.8 09/05/2014 1401   CREATININE 0.71 12/19/2013 0425      Component Value Date/Time   CALCIUM 9.5 09/05/2014 1401   CALCIUM 8.1* 12/19/2013 0425   ALKPHOS 85 09/05/2014 1401   ALKPHOS 66 10/05/2013 1158   AST 29 09/05/2014 1401   AST 23 10/05/2013 1158   ALT 30 09/05/2014 1401   ALT 17 10/05/2013 1158   BILITOT 0.58 09/05/2014 1401   BILITOT 0.8 10/05/2013 1158       No results found for: LABCA2  No components found for: LABCA125  No results for input(s): INR in the last 168 hours.  Urinalysis    Component Value Date/Time   COLORURINE YELLOW 10/05/2013 1158   APPEARANCEUR Cloudy* 10/05/2013 1158   LABSPEC 1.010 10/05/2013 1158   PHURINE 8.5* 10/05/2013 1158   GLUCOSEU NEGATIVE 10/05/2013 1158   HGBUR NEGATIVE 10/05/2013 1158   BILIRUBINUR neg 01/22/2014 1203   BILIRUBINUR NEGATIVE 10/05/2013 1158   KETONESUR NEGATIVE 10/05/2013 1158   PROTEINUR neg 01/22/2014 1203   UROBILINOGEN 0.2 01/22/2014 1203   UROBILINOGEN 0.2 10/05/2013 1158   NITRITE positive 01/22/2014 1203   NITRITE NEGATIVE 10/05/2013 1158   LEUKOCYTESUR small (1+) 01/22/2014 1203    STUDIES: No results found.  ASSESSMENT: 81 y.o. BRCA negative [place] woman status post right breast upper outer quadrant biopsy 11/26/2013 for a clinical T2 N0, stage IIA invasive ductal carcinoma, grade 2, with micropapillary features, triple positive, with an MIB-1 of 31%  (1) right simple mastectomy 12/18/2013 showed a pT2 pNX, stage II invasive ductal carcinoma, grade 2, with negative margins.  (2) 6 mm right lower lobe nodule noted on CT scans 11/19/2013  (a) continuing tobacco abuse-- transitioning to e-cigarettes  (b) emphysema  (c)  Repeat chest CT 06/27/2014  stable  (3) unexplained/ undocumented weight loss   (4) started anastrozole 01/03/2014  (5) started trastuzumab 02/05/2014; to be continued for one year  (a) echo  07/05/2014 shows an EF of 60-65%   (6) tobacco abuse disorder: the patient has been strongly advised to discontinue smoking  PLAN: Allee is doing well today. The labs were reviewed in detail and were entirely stable. She will proceed with her next dose of trastuzumab as planned today. I have placed orders for a repeat echocardiogram due next month. She will continue on the anastrozole daily.  I encouraged Lalania to return to her Boost shakes daily to help maintain her weight.   I have ordered a repeat mammogram  to occur in September. I could not find any baseline results for her bone density scan so I have ordered one to take place at the same time as the mammogram.  Achaia will continue her trastuzumab every 3 weeks. Her next office visit will be in 3 months. She understands and agrees with this plan. She knows the goal of treatment in her case is cure. She has been encouraged to call with any issues that might arise before her next visit here.   Laurie Panda, NP   09/26/2014 3:11 PM

## 2014-09-26 NOTE — Patient Instructions (Signed)

## 2014-09-27 ENCOUNTER — Other Ambulatory Visit: Payer: Self-pay | Admitting: Nurse Practitioner

## 2014-09-27 ENCOUNTER — Telehealth: Payer: Self-pay | Admitting: Nurse Practitioner

## 2014-09-27 NOTE — Telephone Encounter (Signed)
Called patient and left a message with her upcoming appointments

## 2014-09-30 ENCOUNTER — Telehealth: Payer: Self-pay | Admitting: Oncology

## 2014-09-30 NOTE — Telephone Encounter (Signed)
Called and left a message with her echo

## 2014-10-04 ENCOUNTER — Ambulatory Visit (HOSPITAL_COMMUNITY): Payer: Commercial Managed Care - HMO

## 2014-10-14 ENCOUNTER — Other Ambulatory Visit: Payer: Self-pay

## 2014-10-14 ENCOUNTER — Ambulatory Visit (HOSPITAL_COMMUNITY): Payer: Commercial Managed Care - HMO | Attending: Nurse Practitioner

## 2014-10-14 ENCOUNTER — Other Ambulatory Visit: Payer: Self-pay | Admitting: Nurse Practitioner

## 2014-10-14 DIAGNOSIS — C50411 Malignant neoplasm of upper-outer quadrant of right female breast: Secondary | ICD-10-CM

## 2014-10-14 DIAGNOSIS — I351 Nonrheumatic aortic (valve) insufficiency: Secondary | ICD-10-CM | POA: Insufficient documentation

## 2014-10-14 DIAGNOSIS — C50919 Malignant neoplasm of unspecified site of unspecified female breast: Secondary | ICD-10-CM | POA: Diagnosis present

## 2014-10-14 DIAGNOSIS — I340 Nonrheumatic mitral (valve) insufficiency: Secondary | ICD-10-CM | POA: Diagnosis not present

## 2014-10-14 DIAGNOSIS — F17200 Nicotine dependence, unspecified, uncomplicated: Secondary | ICD-10-CM | POA: Insufficient documentation

## 2014-10-17 ENCOUNTER — Other Ambulatory Visit (HOSPITAL_BASED_OUTPATIENT_CLINIC_OR_DEPARTMENT_OTHER): Payer: Commercial Managed Care - HMO

## 2014-10-17 ENCOUNTER — Ambulatory Visit: Payer: Commercial Managed Care - HMO | Admitting: Nutrition

## 2014-10-17 ENCOUNTER — Ambulatory Visit (HOSPITAL_BASED_OUTPATIENT_CLINIC_OR_DEPARTMENT_OTHER): Payer: Commercial Managed Care - HMO

## 2014-10-17 VITALS — BP 121/53 | HR 66 | Temp 98.1°F | Resp 16

## 2014-10-17 DIAGNOSIS — F17210 Nicotine dependence, cigarettes, uncomplicated: Secondary | ICD-10-CM

## 2014-10-17 DIAGNOSIS — C50411 Malignant neoplasm of upper-outer quadrant of right female breast: Secondary | ICD-10-CM

## 2014-10-17 DIAGNOSIS — Z5112 Encounter for antineoplastic immunotherapy: Secondary | ICD-10-CM | POA: Diagnosis not present

## 2014-10-17 DIAGNOSIS — C50919 Malignant neoplasm of unspecified site of unspecified female breast: Secondary | ICD-10-CM

## 2014-10-17 LAB — CBC WITH DIFFERENTIAL/PLATELET
BASO%: 1 % (ref 0.0–2.0)
Basophils Absolute: 0.1 10*3/uL (ref 0.0–0.1)
EOS%: 1.4 % (ref 0.0–7.0)
Eosinophils Absolute: 0.1 10*3/uL (ref 0.0–0.5)
HEMATOCRIT: 42.1 % (ref 34.8–46.6)
HEMOGLOBIN: 14.1 g/dL (ref 11.6–15.9)
LYMPH#: 1.9 10*3/uL (ref 0.9–3.3)
LYMPH%: 30.3 % (ref 14.0–49.7)
MCH: 33.5 pg (ref 25.1–34.0)
MCHC: 33.5 g/dL (ref 31.5–36.0)
MCV: 99.9 fL (ref 79.5–101.0)
MONO#: 0.6 10*3/uL (ref 0.1–0.9)
MONO%: 8.9 % (ref 0.0–14.0)
NEUT#: 3.7 10*3/uL (ref 1.5–6.5)
NEUT%: 58.4 % (ref 38.4–76.8)
PLATELETS: 211 10*3/uL (ref 145–400)
RBC: 4.21 10*6/uL (ref 3.70–5.45)
RDW: 13.3 % (ref 11.2–14.5)
WBC: 6.3 10*3/uL (ref 3.9–10.3)

## 2014-10-17 LAB — COMPREHENSIVE METABOLIC PANEL (CC13)
ALBUMIN: 3.4 g/dL — AB (ref 3.5–5.0)
ALK PHOS: 74 U/L (ref 40–150)
ALT: 25 U/L (ref 0–55)
ANION GAP: 8 meq/L (ref 3–11)
AST: 23 U/L (ref 5–34)
BILIRUBIN TOTAL: 0.71 mg/dL (ref 0.20–1.20)
BUN: 13.2 mg/dL (ref 7.0–26.0)
CALCIUM: 9.7 mg/dL (ref 8.4–10.4)
CO2: 25 mEq/L (ref 22–29)
CREATININE: 0.8 mg/dL (ref 0.6–1.1)
Chloride: 105 mEq/L (ref 98–109)
EGFR: 67 mL/min/{1.73_m2} — ABNORMAL LOW (ref >90)
Glucose: 86 mg/dl (ref 70–140)
Potassium: 4.1 mEq/L (ref 3.5–5.1)
Sodium: 138 mEq/L (ref 136–145)
TOTAL PROTEIN: 6.1 g/dL — AB (ref 6.4–8.3)

## 2014-10-17 MED ORDER — ACETAMINOPHEN 325 MG PO TABS
ORAL_TABLET | ORAL | Status: AC
  Filled 2014-10-17: qty 2

## 2014-10-17 MED ORDER — TRASTUZUMAB CHEMO INJECTION 440 MG
6.0000 mg/kg | Freq: Once | INTRAVENOUS | Status: AC
  Administered 2014-10-17: 315 mg via INTRAVENOUS
  Filled 2014-10-17: qty 15

## 2014-10-17 MED ORDER — DIPHENHYDRAMINE HCL 25 MG PO CAPS
25.0000 mg | ORAL_CAPSULE | Freq: Once | ORAL | Status: AC
  Administered 2014-10-17: 25 mg via ORAL

## 2014-10-17 MED ORDER — ACETAMINOPHEN 325 MG PO TABS
650.0000 mg | ORAL_TABLET | Freq: Once | ORAL | Status: AC
  Administered 2014-10-17: 650 mg via ORAL

## 2014-10-17 MED ORDER — SODIUM CHLORIDE 0.9 % IV SOLN
Freq: Once | INTRAVENOUS | Status: AC
  Administered 2014-10-17: 15:00:00 via INTRAVENOUS

## 2014-10-17 MED ORDER — DIPHENHYDRAMINE HCL 25 MG PO CAPS
ORAL_CAPSULE | ORAL | Status: AC
  Filled 2014-10-17: qty 1

## 2014-10-17 NOTE — Patient Instructions (Signed)

## 2014-10-17 NOTE — Progress Notes (Signed)
Patient is an 81 year old female diagnosed with AAA positive breast cancer receiving chemotherapy. She is a patient of Dr. Jana Hakim.  Past medical history includes COPD, macular degeneration, anxiety, postop nausea vomiting, and pneumonia.  Medications include vitamin B12, Compazine, and vitamin A.  Labs were reviewed.  Height: 66 inches. Weight: 110.5 pounds on July 28. Usual body weight: 155 pounds one year ago per patient. BMI: 17.84.  Patient is underweight with a BMI of 17.84. She reports a poor appetite and decreased oral intake. She was drinking ensure supplements however has recently run out and has not replaced them. She denies difficulty chewing or swallowing, nausea, vomiting, diarrhea, or constipation.  Nutrition diagnosis: Unintended weight loss related to poor appetite as evidenced by 29% weight loss over one year.  Patient meets criteria for severe malnutrition in the context of chronic illness secondary to greater than 20% weight loss over one year and depletion of both muscle and fat mass on physical exam.  Intervention:  Patient educated to increase calories and protein in small frequent meals and snacks. Recommended patient resume oral nutrition supplements one to 2 bottles daily. Provided samples and coupons. Provided fact sheets on poor appetite and ways to increase calories and protein. Questions were answered.  Teach back method used.  Monitoring, evaluation, goals: Patient will tolerate increased calories and protein to minimize further weight loss.  Next visit: Thursday September 8, during infusion.  **Disclaimer: This note was dictated with voice recognition software. Similar sounding words can inadvertently be transcribed and this note may contain transcription errors which may not have been corrected upon publication of note.**

## 2014-10-17 NOTE — Progress Notes (Signed)
Pt states that blisters on hands have improved.

## 2014-10-31 DIAGNOSIS — M810 Age-related osteoporosis without current pathological fracture: Secondary | ICD-10-CM

## 2014-10-31 HISTORY — DX: Age-related osteoporosis without current pathological fracture: M81.0

## 2014-11-07 ENCOUNTER — Other Ambulatory Visit: Payer: Self-pay

## 2014-11-07 ENCOUNTER — Other Ambulatory Visit (HOSPITAL_BASED_OUTPATIENT_CLINIC_OR_DEPARTMENT_OTHER): Payer: Commercial Managed Care - HMO

## 2014-11-07 ENCOUNTER — Ambulatory Visit (HOSPITAL_BASED_OUTPATIENT_CLINIC_OR_DEPARTMENT_OTHER): Payer: Commercial Managed Care - HMO | Admitting: Nurse Practitioner

## 2014-11-07 ENCOUNTER — Ambulatory Visit: Payer: Commercial Managed Care - HMO | Admitting: Nutrition

## 2014-11-07 ENCOUNTER — Ambulatory Visit (HOSPITAL_BASED_OUTPATIENT_CLINIC_OR_DEPARTMENT_OTHER): Payer: Commercial Managed Care - HMO

## 2014-11-07 VITALS — BP 139/49 | HR 67 | Temp 98.8°F | Resp 18

## 2014-11-07 DIAGNOSIS — C50411 Malignant neoplasm of upper-outer quadrant of right female breast: Secondary | ICD-10-CM | POA: Diagnosis not present

## 2014-11-07 DIAGNOSIS — C50919 Malignant neoplasm of unspecified site of unspecified female breast: Secondary | ICD-10-CM

## 2014-11-07 DIAGNOSIS — Z5111 Encounter for antineoplastic chemotherapy: Secondary | ICD-10-CM | POA: Diagnosis not present

## 2014-11-07 DIAGNOSIS — R21 Rash and other nonspecific skin eruption: Secondary | ICD-10-CM | POA: Diagnosis not present

## 2014-11-07 DIAGNOSIS — F17210 Nicotine dependence, cigarettes, uncomplicated: Secondary | ICD-10-CM

## 2014-11-07 LAB — CBC WITH DIFFERENTIAL/PLATELET
BASO%: 1.2 % (ref 0.0–2.0)
Basophils Absolute: 0.1 10*3/uL (ref 0.0–0.1)
EOS ABS: 0.1 10*3/uL (ref 0.0–0.5)
EOS%: 1.6 % (ref 0.0–7.0)
HCT: 42.5 % (ref 34.8–46.6)
HGB: 14.2 g/dL (ref 11.6–15.9)
LYMPH%: 28.8 % (ref 14.0–49.7)
MCH: 33.2 pg (ref 25.1–34.0)
MCHC: 33.4 g/dL (ref 31.5–36.0)
MCV: 99.4 fL (ref 79.5–101.0)
MONO#: 0.7 10*3/uL (ref 0.1–0.9)
MONO%: 10.4 % (ref 0.0–14.0)
NEUT%: 58 % (ref 38.4–76.8)
NEUTROS ABS: 3.8 10*3/uL (ref 1.5–6.5)
PLATELETS: 215 10*3/uL (ref 145–400)
RBC: 4.28 10*6/uL (ref 3.70–5.45)
RDW: 13.2 % (ref 11.2–14.5)
WBC: 6.5 10*3/uL (ref 3.9–10.3)
lymph#: 1.9 10*3/uL (ref 0.9–3.3)

## 2014-11-07 LAB — COMPREHENSIVE METABOLIC PANEL (CC13)
ALT: 26 U/L (ref 0–55)
ANION GAP: 7 meq/L (ref 3–11)
AST: 25 U/L (ref 5–34)
Albumin: 3.5 g/dL (ref 3.5–5.0)
Alkaline Phosphatase: 80 U/L (ref 40–150)
BUN: 15.4 mg/dL (ref 7.0–26.0)
CALCIUM: 9.3 mg/dL (ref 8.4–10.4)
CHLORIDE: 107 meq/L (ref 98–109)
CO2: 26 mEq/L (ref 22–29)
Creatinine: 0.8 mg/dL (ref 0.6–1.1)
EGFR: 66 mL/min/{1.73_m2} — AB (ref >90)
Glucose: 77 mg/dl (ref 70–140)
POTASSIUM: 4.4 meq/L (ref 3.5–5.1)
Sodium: 140 mEq/L (ref 136–145)
Total Bilirubin: 0.68 mg/dL (ref 0.20–1.20)
Total Protein: 6.4 g/dL (ref 6.4–8.3)

## 2014-11-07 MED ORDER — SODIUM CHLORIDE 0.9 % IV SOLN
Freq: Once | INTRAVENOUS | Status: AC
  Administered 2014-11-07: 15:00:00 via INTRAVENOUS

## 2014-11-07 MED ORDER — ACETAMINOPHEN 325 MG PO TABS
650.0000 mg | ORAL_TABLET | Freq: Once | ORAL | Status: AC
  Administered 2014-11-07: 650 mg via ORAL

## 2014-11-07 MED ORDER — TRASTUZUMAB CHEMO INJECTION 440 MG
6.0000 mg/kg | Freq: Once | INTRAVENOUS | Status: AC
  Administered 2014-11-07: 315 mg via INTRAVENOUS
  Filled 2014-11-07: qty 15

## 2014-11-07 MED ORDER — ACETAMINOPHEN 325 MG PO TABS
ORAL_TABLET | ORAL | Status: AC
  Filled 2014-11-07: qty 2

## 2014-11-07 MED ORDER — DIPHENHYDRAMINE HCL 25 MG PO CAPS
25.0000 mg | ORAL_CAPSULE | Freq: Once | ORAL | Status: AC
Start: 2014-11-07 — End: 2014-11-07
  Administered 2014-11-07: 25 mg via ORAL

## 2014-11-07 MED ORDER — DIPHENHYDRAMINE HCL 25 MG PO CAPS
ORAL_CAPSULE | ORAL | Status: AC
  Filled 2014-11-07: qty 1

## 2014-11-07 NOTE — Progress Notes (Signed)
Patient complained of blisters on bilateral hands three weeks ago. Dr. Jana Hakim notified, proceed with Herceptin. Patient in infusion today complains that most blisters have resolved on her hands but a few. Patient also complains of blisters located on her buttocks x 1 week. Patient states blisters don't itch, or hurt. Patient denies fevers, or chills. Patient denies changing laundry detergent, body wash or medications. Patient states blisters located near her buttocks are starting to scab. Selena Lesser, NP notified.

## 2014-11-07 NOTE — Patient Instructions (Signed)
[facility] Discharge Instructions for Patients Receiving Chemotherapy  Today you received the following chemotherapy agents:  Herceptin  To help prevent nausea and vomiting after your treatment, we encourage you to take your nausea medication as prescribed.   If you develop nausea and vomiting that is not controlled by your nausea medication, call the clinic.   BELOW ARE SYMPTOMS THAT SHOULD BE REPORTED IMMEDIATELY:  *FEVER GREATER THAN 100.5 F  *CHILLS WITH OR WITHOUT FEVER  NAUSEA AND VOMITING THAT IS NOT CONTROLLED WITH YOUR NAUSEA MEDICATION  *UNUSUAL SHORTNESS OF BREATH  *UNUSUAL BRUISING OR BLEEDING  TENDERNESS IN MOUTH AND THROAT WITH OR WITHOUT PRESENCE OF ULCERS  *URINARY PROBLEMS  *BOWEL PROBLEMS  UNUSUAL RASH Items with * indicate a potential emergency and should be followed up as soon as possible.  Feel free to call the clinic you have any questions or concerns. The clinic phone number is [phone].  Please show the CHEMO ALERT CARD at check-in to the Emergency Department and triage nurse.   

## 2014-11-07 NOTE — Progress Notes (Signed)
Nutrition follow-up completed with patient diagnosed with triple A positive breast cancer. There is no new weight documented in the chart.  Last weight was 110.5 pounds July 28. Patient reports poor appetite and inadequate oral intake continue Patient is drinking ensure supplements and enjoys them. Patient denies new nutrition impact symptoms.  Nutrition diagnosis: Unintended weight loss cannot be evaluated.  Intervention: Enforced the importance of continued high-calorie high-protein foods. Patient should continue oral nutrition supplements twice a day between meals. Provided additional coupons. Questions were answered.  Teach back method was used.  Monitoring, evaluation, goals: Patient will tolerate increased calories and protein to minimize weight loss.  Next visit: Thursday, September 29, during infusion.  **Disclaimer: This note was dictated with voice recognition software. Similar sounding words can inadvertently be transcribed and this note may contain transcription errors which may not have been corrected upon publication of note.**

## 2014-11-10 ENCOUNTER — Encounter: Payer: Self-pay | Admitting: Nurse Practitioner

## 2014-11-10 NOTE — Assessment & Plan Note (Signed)
Pt c/o continued mild peeling of bilat hands; especially to her fingers.  Patient has one pustule to the palm of her hand; and has also noticed a healing blister to her right buttock as well.  Denies any new lotions, shampoos, etc.  On exam- patient has a tiny pustule to the palm of her left hand.  Also, patients right buttock with what appears to be a healing questionable blister--like lesion.  No evidence of erythema, surrounding edema, tenderness, warmth, or red streaks.  No evidence of active infection..  Advised pt to try aquaphor samples to hands as directed to see if that helps. Advised pt f/u with her PCP if symptoms persist or worsen.

## 2014-11-10 NOTE — Assessment & Plan Note (Signed)
Pt presented today for Herceptin infusion.  All labs remain stable. Pt scheduled to return 11/25/2014 for her next DEXA bone scan and mammogram.  Patient has plans to return 11/28/2014 for labs and her next cycle of Herceptin.

## 2014-11-10 NOTE — Progress Notes (Signed)
SYMPTOM MANAGEMENT CLINIC   HPI: Gabrielle [name] 81 y.o. female diagnosed with breast cancer.  Currently undergoing Herceptin infusions.  Pt c/o continued mild peeling of bilat hands; especially to her fingers.  Patient has one pustule to the palm of her hand; and has also noticed a healing blister to her right buttock as well.  Denies any new lotions, shampoos, etc.  On exam- patient has a tiny pustule to the palm of her left hand.  Also, patients right buttock with what appears to be a healing questionable blister--like lesion.  No evidence of erythema, surrounding edema, tenderness, warmth, or red streaks.  No evidence of active infection..  Advised pt to try aquaphor samples to hands as directed to see if that helps. Advised pt f/u with her PCP if symptoms persist or worsen.   HPI  ROS  Past Medical History  Diagnosis Date  . Cataract   . Retina hole   . COPD (chronic obstructive pulmonary disease)   . Macular degeneration   . Anxiety   . Breast cancer of upper-outer quadrant of right female breast   . Complication of anesthesia     nausea and vomiting   . PONV (postoperative nausea and vomiting)   . Shortness of breath     increased activity   . Pneumonia     hx of     Past Surgical History  Procedure Laterality Date  . Cataract extraction Bilateral   . Retinal laser procedure    . Abdominal hysterectomy  1980    (ovaries intact)  . Appendectomy    . Total mastectomy Right 12/18/2013    Procedure: RIGHT TOTAL MASTECTOMY;  Surgeon: Excell Seltzer, MD;  Location: [facility];  Service: General;  Laterality: Right;    has Loss of weight; Dizzy; Swelling of both lower extremities; Cigarette smoker; Malodorous urine; Forgetfulness; Vitamin B12 deficiency without anemia; Vitamin D deficiency; Emphysema of lung; Breast mass, right; Lung nodule, solitary; Breast cancer of upper-outer quadrant of right female breast; Cough; COPD, moderate; Preoperative respiratory examination;  Smoking; Nodule of right lung; Cachexia; Cancer of breast, female; Skin lesion of face; Urinary tract infectious disease; Postoperative wound infection; Recurrent UTI; Insect bite; and Rash on her problem list.    has No Known Allergies.    Medication List       This list is accurate as of: 11/07/14 11:59 PM.  Always use your most recent med list.               anastrozole 1 MG tablet  Commonly known as:  ARIMIDEX     [employer] D3 5000 UNITS capsule  Generic drug:  Cholecalciferol  Take 5,000 Units by mouth every other day.     donepezil 5 MG tablet  Commonly known as:  ARICEPT  Take 1 tablet daily for one month, then increase to 2 tablets daily.     fluticasone 50 MCG/ACT nasal spray  Commonly known as:  FLONASE  Place into both nostrils daily as needed.     prochlorperazine 10 MG tablet  Commonly known as:  COMPAZINE  Take 1 tablet (10 mg total) by mouth every 6 (six) hours as needed for nausea or vomiting.     SPIRIVA RESPIMAT 2.5 MCG/ACT Aers  Generic drug:  Tiotropium Bromide Monohydrate  Inhale 2 puffs into the lungs every morning. @@ 11 am     vitamin A 10000 UNIT capsule  Take 10,000 Units by mouth every morning.     VITAMIN B-12  PO  Take 1 drop by mouth daily.         PHYSICAL EXAMINATION  Oncology Vitals 11/07/2014 10/17/2014 09/26/2014 09/05/2014 08/15/2014 07/25/2014 07/23/2014  Height - - 168 cm - 168 cm 168 cm -  Weight - - 50.122 kg - 51.483 kg 51.313 kg -  Weight (lbs) - - 110 lbs 8 oz - 113 lbs 8 oz 113 lbs 2 oz -  BMI (kg/m2) - - 17.83 kg/m2 - 18.32 kg/m2 18.26 kg/m2 -  Temp 98.8 98.1 97.9 98 98 - 97.8  Pulse 67 66 90 72 70 82 65  Resp '18 16 18 ' - 18 - 18  SpO2 98 99 - - 98 88 99  BSA (m2) - - 1.53 m2 - 1.55 m2 1.55 m2 -   BP Readings from Last 3 Encounters:  11/07/14 139/49  10/17/14 121/53  09/26/14 136/53    Physical Exam  Constitutional: She is oriented to person, place, and time and well-developed, well-nourished, and in no distress.  HENT:    Head: Normocephalic and atraumatic.  Eyes: Conjunctivae and EOM are normal. Pupils are equal, round, and reactive to light. Right eye exhibits no discharge. Left eye exhibits no discharge. No scleral icterus.  Neck: Normal range of motion.  Pulmonary/Chest: Effort normal. No respiratory distress.  Musculoskeletal: Normal range of motion. She exhibits no edema or tenderness.  Neurological: She is alert and oriented to person, place, and time. Gait normal.  Skin: Skin is warm and dry. Rash noted. No erythema. No pallor.  On exam- patient has a tiny pustule to the palm of her left hand.  Also, patients right buttock with what appears to be a healing questionable blister--like lesion.  No evidence of erythema, surrounding edema, tenderness, warmth, or red streaks.  No evidence of active infection.      Psychiatric: Affect normal.  Nursing note and vitals reviewed.   LABORATORY DATA:. Appointment on 11/07/2014  Component Date Value Ref Range Status  . WBC 11/07/2014 6.5  3.9 - 10.3 10e3/uL Final  . NEUT# 11/07/2014 3.8  1.5 - 6.5 10e3/uL Final  . HGB 11/07/2014 14.2  11.6 - 15.9 g/dL Final  . HCT 11/07/2014 42.5  34.8 - 46.6 % Final  . Platelets 11/07/2014 215  145 - 400 10e3/uL Final  . MCV 11/07/2014 99.4  79.5 - 101.0 fL Final  . MCH 11/07/2014 33.2  25.1 - 34.0 pg Final  . MCHC 11/07/2014 33.4  31.5 - 36.0 g/dL Final  . RBC 11/07/2014 4.28  3.70 - 5.45 10e6/uL Final  . RDW 11/07/2014 13.2  11.2 - 14.5 % Final  . lymph# 11/07/2014 1.9  0.9 - 3.3 10e3/uL Final  . MONO# 11/07/2014 0.7  0.1 - 0.9 10e3/uL Final  . Eosinophils Absolute 11/07/2014 0.1  0.0 - 0.5 10e3/uL Final  . Basophils Absolute 11/07/2014 0.1  0.0 - 0.1 10e3/uL Final  . NEUT% 11/07/2014 58.0  38.4 - 76.8 % Final  . LYMPH% 11/07/2014 28.8  14.0 - 49.7 % Final  . MONO% 11/07/2014 10.4  0.0 - 14.0 % Final  . EOS% 11/07/2014 1.6  0.0 - 7.0 % Final  . BASO% 11/07/2014 1.2  0.0 - 2.0 % Final  . Sodium 11/07/2014 140  136  - 145 mEq/L Final  . Potassium 11/07/2014 4.4  3.5 - 5.1 mEq/L Final  . Chloride 11/07/2014 107  98 - 109 mEq/L Final  . CO2 11/07/2014 26  22 - 29 mEq/L Final  . Glucose 11/07/2014 77  70 -  140 mg/dl Final  . BUN 11/07/2014 15.4  7.0 - 26.0 mg/dL Final  . Creatinine 11/07/2014 0.8  0.6 - 1.1 mg/dL Final  . Total Bilirubin 11/07/2014 0.68  0.20 - 1.20 mg/dL Final  . Alkaline Phosphatase 11/07/2014 80  40 - 150 U/L Final  . AST 11/07/2014 25  5 - 34 U/L Final  . ALT 11/07/2014 26  0 - 55 U/L Final  . Total Protein 11/07/2014 6.4  6.4 - 8.3 g/dL Final  . Albumin 11/07/2014 3.5  3.5 - 5.0 g/dL Final  . Calcium 11/07/2014 9.3  8.4 - 10.4 mg/dL Final  . Anion Gap 11/07/2014 7  3 - 11 mEq/L Final  . EGFR 11/07/2014 66* >90 ml/min/1.73 m2 Final   eGFR is calculated using the CKD-EPI Creatinine Equation (2009)     RADIOGRAPHIC STUDIES: No results found.  ASSESSMENT/PLAN:    Breast cancer of upper-outer quadrant of right female breast Pt presented today for Herceptin infusion.  All labs remain stable. Pt scheduled to return 11/25/2014 for her next DEXA bone scan and mammogram.  Patient has plans to return 11/28/2014 for labs and her next cycle of Herceptin.   Rash Pt c/o continued mild peeling of bilat hands; especially to her fingers.  Patient has one pustule to the palm of her hand; and has also noticed a healing blister to her right buttock as well.  Denies any new lotions, shampoos, etc.  On exam- patient has a tiny pustule to the palm of her left hand.  Also, patients right buttock with what appears to be a healing questionable blister--like lesion.  No evidence of erythema, surrounding edema, tenderness, warmth, or red streaks.  No evidence of active infection..  Advised pt to try aquaphor samples to hands as directed to see if that helps. Advised pt f/u with her PCP if symptoms persist or worsen.     Patient stated understanding of all instructions; and was in agreement with this  plan of care. The patient knows to call the clinic with any problems, questions or concerns.   Review/collaboration with Dr. Jana Hakim regarding all aspects of patient's visit today.   Total time spent with patient was 15 minutes;  with greater than 75 percent of that time spent in face to face counseling regarding patient's symptoms,  and coordination of care and follow up.  Disclaimer:This dictation was prepared with Dragon/digital dictation along with [employer]. Any transcriptional errors that result from this process are unintentional.  Drue Second, NP 11/10/2014

## 2014-11-12 ENCOUNTER — Other Ambulatory Visit: Payer: Commercial Managed Care - HMO

## 2014-11-12 ENCOUNTER — Ambulatory Visit: Payer: Commercial Managed Care - HMO | Admitting: Oncology

## 2014-11-25 ENCOUNTER — Ambulatory Visit
Admission: RE | Admit: 2014-11-25 | Discharge: 2014-11-25 | Disposition: A | Discharge status: Home / Self Care | Payer: Commercial Managed Care - HMO | Source: Ambulatory Visit | Attending: Nurse Practitioner | Admitting: Nurse Practitioner

## 2014-11-25 ENCOUNTER — Other Ambulatory Visit: Payer: Commercial Managed Care - HMO

## 2014-11-25 DIAGNOSIS — Z780 Asymptomatic menopausal state: Secondary | ICD-10-CM | POA: Diagnosis not present

## 2014-11-25 DIAGNOSIS — Z1231 Encounter for screening mammogram for malignant neoplasm of breast: Secondary | ICD-10-CM | POA: Diagnosis not present

## 2014-11-25 DIAGNOSIS — M810 Age-related osteoporosis without current pathological fracture: Secondary | ICD-10-CM | POA: Diagnosis not present

## 2014-11-25 DIAGNOSIS — C50411 Malignant neoplasm of upper-outer quadrant of right female breast: Secondary | ICD-10-CM

## 2014-11-28 ENCOUNTER — Other Ambulatory Visit (HOSPITAL_BASED_OUTPATIENT_CLINIC_OR_DEPARTMENT_OTHER): Payer: Commercial Managed Care - HMO

## 2014-11-28 ENCOUNTER — Ambulatory Visit: Payer: Commercial Managed Care - HMO | Admitting: Nutrition

## 2014-11-28 ENCOUNTER — Ambulatory Visit (HOSPITAL_BASED_OUTPATIENT_CLINIC_OR_DEPARTMENT_OTHER): Payer: Commercial Managed Care - HMO

## 2014-11-28 VITALS — BP 142/71 | HR 73 | Temp 98.1°F | Resp 18

## 2014-11-28 DIAGNOSIS — Z5112 Encounter for antineoplastic immunotherapy: Secondary | ICD-10-CM

## 2014-11-28 DIAGNOSIS — C50411 Malignant neoplasm of upper-outer quadrant of right female breast: Secondary | ICD-10-CM

## 2014-11-28 DIAGNOSIS — F17210 Nicotine dependence, cigarettes, uncomplicated: Secondary | ICD-10-CM

## 2014-11-28 DIAGNOSIS — C50919 Malignant neoplasm of unspecified site of unspecified female breast: Secondary | ICD-10-CM

## 2014-11-28 LAB — CBC WITH DIFFERENTIAL/PLATELET
BASO%: 1 % (ref 0.0–2.0)
Basophils Absolute: 0.1 10*3/uL (ref 0.0–0.1)
EOS%: 0.8 % (ref 0.0–7.0)
Eosinophils Absolute: 0.1 10*3/uL (ref 0.0–0.5)
HEMATOCRIT: 43.6 % (ref 34.8–46.6)
HGB: 14.6 g/dL (ref 11.6–15.9)
LYMPH#: 1.9 10*3/uL (ref 0.9–3.3)
LYMPH%: 25 % (ref 14.0–49.7)
MCH: 33.3 pg (ref 25.1–34.0)
MCHC: 33.4 g/dL (ref 31.5–36.0)
MCV: 99.9 fL (ref 79.5–101.0)
MONO#: 0.6 10*3/uL (ref 0.1–0.9)
MONO%: 8.3 % (ref 0.0–14.0)
NEUT%: 64.9 % (ref 38.4–76.8)
NEUTROS ABS: 4.9 10*3/uL (ref 1.5–6.5)
PLATELETS: 208 10*3/uL (ref 145–400)
RBC: 4.37 10*6/uL (ref 3.70–5.45)
RDW: 13.4 % (ref 11.2–14.5)
WBC: 7.5 10*3/uL (ref 3.9–10.3)

## 2014-11-28 MED ORDER — ACETAMINOPHEN 325 MG PO TABS
ORAL_TABLET | ORAL | Status: AC
  Filled 2014-11-28: qty 2

## 2014-11-28 MED ORDER — TRASTUZUMAB CHEMO INJECTION 440 MG
6.0000 mg/kg | Freq: Once | INTRAVENOUS | Status: AC
  Administered 2014-11-28: 315 mg via INTRAVENOUS
  Filled 2014-11-28: qty 15

## 2014-11-28 MED ORDER — DIPHENHYDRAMINE HCL 25 MG PO CAPS
25.0000 mg | ORAL_CAPSULE | Freq: Once | ORAL | Status: AC
  Administered 2014-11-28: 25 mg via ORAL

## 2014-11-28 MED ORDER — ACETAMINOPHEN 325 MG PO TABS
650.0000 mg | ORAL_TABLET | Freq: Once | ORAL | Status: AC
  Administered 2014-11-28: 650 mg via ORAL

## 2014-11-28 MED ORDER — SODIUM CHLORIDE 0.9 % IV SOLN
Freq: Once | INTRAVENOUS | Status: AC
  Administered 2014-11-28: 14:00:00 via INTRAVENOUS

## 2014-11-28 MED ORDER — DIPHENHYDRAMINE HCL 25 MG PO CAPS
ORAL_CAPSULE | ORAL | Status: AC
  Filled 2014-11-28: qty 1

## 2014-11-28 NOTE — Progress Notes (Signed)
Nutrition follow-up completed with patient diagnosed with AAA positive breast cancer. Last weight documented was 110.5 pounds July 28 Patient reports she is still losing weight although she cannot report her current weight. She drinks one ensure supplement daily. Poor appetite continues.  Nutrition diagnosis: Unintended weight loss likely continues.  Intervention:  Patient should continue oral nutrition supplements and increase twice a day between meals. Provided additional coupons. Patient is agreeable to increasing Ensure Plus twice a day.  Teach back method used.  Monitoring, evaluation, goals: Patient will work to increase calories and protein to minimize weight loss.  Next visit: To be scheduled as needed.  **Disclaimer: This note was dictated with voice recognition software. Similar sounding words can inadvertently be transcribed and this note may contain transcription errors which may not have been corrected upon publication of note.**

## 2014-11-28 NOTE — Patient Instructions (Signed)
[facility] Discharge Instructions for Patients Receiving Chemotherapy  Today you received the following chemotherapy agents Herceptin  To help prevent nausea and vomiting after your treatment, we encourage you to take your nausea medication as prescribed by your md.   If you develop nausea and vomiting that is not controlled by your nausea medication, call the clinic.   BELOW ARE SYMPTOMS THAT SHOULD BE REPORTED IMMEDIATELY:  *FEVER GREATER THAN 100.5 F  *CHILLS WITH OR WITHOUT FEVER  NAUSEA AND VOMITING THAT IS NOT CONTROLLED WITH YOUR NAUSEA MEDICATION  *UNUSUAL SHORTNESS OF BREATH  *UNUSUAL BRUISING OR BLEEDING  TENDERNESS IN MOUTH AND THROAT WITH OR WITHOUT PRESENCE OF ULCERS  *URINARY PROBLEMS  *BOWEL PROBLEMS  UNUSUAL RASH Items with * indicate a potential emergency and should be followed up as soon as possible.  Feel free to call the clinic you have any questions or concerns. The clinic phone number is [phone].  Please show the [facility] at check-in to the Emergency Department and triage nurse.  Trastuzumab injection for infusion What is this medicine? TRASTUZUMAB (tras TOO zoo mab) is a monoclonal antibody. It targets a protein called HER2. This protein is found in some stomach and breast cancers. This medicine can stop cancer cell growth. This medicine may be used with other cancer treatments. This medicine may be used for other purposes; ask your health care provider or pharmacist if you have questions. COMMON BRAND NAME(S): Herceptin What should I tell my health care provider before I take this medicine? They need to know if you have any of these conditions: -heart disease -heart failure -infection (especially a virus infection such as chickenpox, cold sores, or herpes) -lung or breathing disease, like asthma -recent or ongoing radiation therapy -an unusual or allergic reaction to trastuzumab, benzyl alcohol, or other medications,  foods, dyes, or preservatives -pregnant or trying to get pregnant -breast-feeding How should I use this medicine? This drug is given as an infusion into a vein. It is administered in a hospital or clinic by a specially trained health care professional. Talk to your pediatrician regarding the use of this medicine in children. This medicine is not approved for use in children. Overdosage: If you think you have taken too much of this medicine contact a poison control center or emergency room at once. NOTE: This medicine is only for you. Do not share this medicine with others. What if I miss a dose? It is important not to miss a dose. Call your doctor or health care professional if you are unable to keep an appointment. What may interact with this medicine? -cyclophosphamide -doxorubicin -warfarin This list may not describe all possible interactions. Give your health care provider a list of all the medicines, herbs, non-prescription drugs, or dietary supplements you use. Also tell them if you smoke, drink alcohol, or use illegal drugs. Some items may interact with your medicine. What should I watch for while using this medicine? Visit your doctor for checks on your progress. Report any side effects. Continue your course of treatment even though you feel ill unless your doctor tells you to stop. Call your doctor or health care professional for advice if you get a fever, chills or sore throat, or other symptoms of a cold or flu. Do not treat yourself. Try to avoid being around people who are sick. You may experience fever, chills and shaking during your first infusion. These effects are usually mild and can be treated with other medicines. Report any  side effects during the infusion to your health care professional. Fever and chills usually do not happen with later infusions. What side effects may I notice from receiving this medicine? Side effects that you should report to your doctor or other health  care professional as soon as possible: -breathing difficulties -chest pain or palpitations -cough -dizziness or fainting -fever or chills, sore throat -skin rash, itching or hives -swelling of the legs or ankles -unusually weak or tired Side effects that usually do not require medical attention (report to your doctor or other health care professional if they continue or are bothersome): -loss of appetite -headache -muscle aches -nausea This list may not describe all possible side effects. Call your doctor for medical advice about side effects. You may report side effects to FDA at 1-800-FDA-1088. Where should I keep my medicine? This drug is given in a hospital or clinic and will not be stored at home. NOTE: This sheet is a summary. It may not cover all possible information. If you have questions about this medicine, talk to your doctor, pharmacist, or health care provider.  2015, [employer]. (2008-12-20 13:43:15)

## 2014-12-19 ENCOUNTER — Telehealth: Payer: Self-pay | Admitting: Oncology

## 2014-12-19 ENCOUNTER — Other Ambulatory Visit (HOSPITAL_BASED_OUTPATIENT_CLINIC_OR_DEPARTMENT_OTHER): Payer: Commercial Managed Care - HMO

## 2014-12-19 ENCOUNTER — Other Ambulatory Visit: Payer: Self-pay | Admitting: *Deleted

## 2014-12-19 ENCOUNTER — Ambulatory Visit (HOSPITAL_BASED_OUTPATIENT_CLINIC_OR_DEPARTMENT_OTHER): Payer: Commercial Managed Care - HMO | Admitting: Nurse Practitioner

## 2014-12-19 VITALS — BP 131/47 | HR 95 | Temp 98.4°F | Resp 18 | Ht 66.0 in | Wt 113.1 lb

## 2014-12-19 DIAGNOSIS — Z720 Tobacco use: Secondary | ICD-10-CM

## 2014-12-19 DIAGNOSIS — C50919 Malignant neoplasm of unspecified site of unspecified female breast: Secondary | ICD-10-CM

## 2014-12-19 DIAGNOSIS — M810 Age-related osteoporosis without current pathological fracture: Secondary | ICD-10-CM | POA: Diagnosis not present

## 2014-12-19 DIAGNOSIS — F17210 Nicotine dependence, cigarettes, uncomplicated: Secondary | ICD-10-CM

## 2014-12-19 DIAGNOSIS — R911 Solitary pulmonary nodule: Secondary | ICD-10-CM

## 2014-12-19 DIAGNOSIS — Z23 Encounter for immunization: Secondary | ICD-10-CM | POA: Diagnosis not present

## 2014-12-19 DIAGNOSIS — Z170 Estrogen receptor positive status [ER+]: Secondary | ICD-10-CM

## 2014-12-19 DIAGNOSIS — C50411 Malignant neoplasm of upper-outer quadrant of right female breast: Secondary | ICD-10-CM

## 2014-12-19 DIAGNOSIS — R634 Abnormal weight loss: Secondary | ICD-10-CM | POA: Diagnosis not present

## 2014-12-19 LAB — COMPREHENSIVE METABOLIC PANEL (CC13)
ALT: 28 U/L (ref 0–55)
AST: 27 U/L (ref 5–34)
Albumin: 3.5 g/dL (ref 3.5–5.0)
Alkaline Phosphatase: 81 U/L (ref 40–150)
Anion Gap: 7 mEq/L (ref 3–11)
BUN: 16.2 mg/dL (ref 7.0–26.0)
CHLORIDE: 106 meq/L (ref 98–109)
CO2: 30 meq/L — AB (ref 22–29)
CREATININE: 1 mg/dL (ref 0.6–1.1)
Calcium: 9.4 mg/dL (ref 8.4–10.4)
EGFR: 56 mL/min/{1.73_m2} — ABNORMAL LOW (ref >90)
Glucose: 167 mg/dl — ABNORMAL HIGH (ref 70–140)
Potassium: 3.9 mEq/L (ref 3.5–5.1)
Sodium: 142 mEq/L (ref 136–145)
Total Bilirubin: 0.49 mg/dL (ref 0.20–1.20)
Total Protein: 6.4 g/dL (ref 6.4–8.3)

## 2014-12-19 LAB — CBC WITH DIFFERENTIAL/PLATELET
BASO%: 1.2 % (ref 0.0–2.0)
Basophils Absolute: 0.1 10*3/uL (ref 0.0–0.1)
EOS%: 1 % (ref 0.0–7.0)
Eosinophils Absolute: 0.1 10*3/uL (ref 0.0–0.5)
HCT: 43 % (ref 34.8–46.6)
HGB: 14.3 g/dL (ref 11.6–15.9)
LYMPH%: 20.1 % (ref 14.0–49.7)
MCH: 32.9 pg (ref 25.1–34.0)
MCHC: 33.2 g/dL (ref 31.5–36.0)
MCV: 99.1 fL (ref 79.5–101.0)
MONO#: 0.5 10*3/uL (ref 0.1–0.9)
MONO%: 7.1 % (ref 0.0–14.0)
NEUT#: 5.4 10*3/uL (ref 1.5–6.5)
NEUT%: 70.6 % (ref 38.4–76.8)
Platelets: 209 10*3/uL (ref 145–400)
RBC: 4.33 10*6/uL (ref 3.70–5.45)
RDW: 12.6 % (ref 11.2–14.5)
WBC: 7.7 10*3/uL (ref 3.9–10.3)
lymph#: 1.5 10*3/uL (ref 0.9–3.3)

## 2014-12-19 MED ORDER — INFLUENZA VAC SPLIT QUAD 0.5 ML IM SUSY
0.5000 mL | PREFILLED_SYRINGE | Freq: Once | INTRAMUSCULAR | Status: AC
  Administered 2014-12-19: 0.5 mL via INTRAMUSCULAR
  Filled 2014-12-19: qty 0.5

## 2014-12-19 MED ORDER — ANASTROZOLE 1 MG PO TABS: 1.0000 mg | ORAL_TABLET | Freq: Every day | ORAL | Status: DC

## 2014-12-19 NOTE — Telephone Encounter (Signed)
GAVE PATIENT AVS REPORT AND APPOINTMENTS FOR October 2016 [facility] January 2017. ECHO TO [place].

## 2014-12-19 NOTE — Progress Notes (Signed)
[place]  [phone] [phone]) [phone]     ID: Gabrielle [name] DOB: May 08, 1930  MR#: [id number]  JYN#:[id number]  Patient Care Team: Gabrielle Pap, NP as PCP - General (Nurse Practitioner) Gabrielle Seltzer, MD as Consulting Physician (General Surgery) Gabrielle Cruel, MD as Consulting Physician (Oncology) Gabrielle Koh, MD as Consulting Physician (Radiation Oncology) OTHER MD:  CHIEF COMPLAINT: Triple positive breast cancer  CURRENT TREATMENT:  Anastrozole, ttrasztuzumab  BREAST CANCER HISTORY: From the original intake note:  Gabrielle [name] had not been to a doctor for "more than 20 years. Since Christmas 2014 and the family has become increasingly concerned that she appeared to be losing weight and was a bit more confused. They asked the patient how much weight she had lost and she said she had lost "about 50 pounds.". However, the only weight we have available before August of this year is from August of 2014 and it was 115 pounds at that time.  Nevertheless with this concern the patient agreed to be seen by a physician and on 10/06/2013 she was seen by Dr. Kathlen Mody who obtained a full battery of tests. He found the patient to have a urinary tract infection with Escherichia coli, which may explain some of her confusion. Vitamin B 12 level was 215, which is in the low normal range. Hemoglobin was 14.7 with an MCV of 99.0. The patient was started on B12 supplementation parenterally and a chest x-ray was obtained 10/05/2013 which showed evidence of emphysema and prior granulomatous disease. To make sure an occult cancer was not being missed a CT scan of the chest abdomen and pelvis was obtained 11/19/2013. This showed a calcified granuloma in the inferior lingula. There was also a noncalcified 6 mm nodule in the lateral portion of the right lower lobe. There was no mediastinal mass or adenopathy. There were calcified granulomata in the spleen as well. There was  significant atherosclerotic calcifications. There was sigmoid diverticulosis noted incidentally. In addition, a 1.8 cm right breast mass was noted.  The breast mass was evaluated further with bilateral diagnostic mammography and right breast ultrasonography of the breast Center 11/26/2013. The breast density was category C. There was indeed an irregular mass in the right breast which was on palpation firm nontender and noted at the 10:30 position. Ultrasound confirmed a hypoechoic irregular mass measuring up to 3 cm. There was no right axillary adenopathy noted.  Biopsy of the right breast mass in question 11/26/2013 showed ([id number]) and invasive ductal carcinoma, grade 2, estrogen receptor 100% positive, progesterone receptor 59% positive, both with strong staining intensity, with an MIB-1 of 31%, and with HER-2 amplification, the signals ratio being 2.79 and the copy number per cell 5.45.  On 11-2013 the patient underwent bilateral breast MRI. This showed the enhancing mass in the upper-outer quadrant of the right breast to measure 2.1 cm. There was no other findings in the right breast, left breast, or either axilla.  The patient's subsequent history is as detailed below  INTERVAL HISTORY: Gabrielle [name] returns today for follow-up of her breast cancer. She continues on anastrozole daily and tolerates this well with no side effects that she is aware of. She is due for trastuzumab today. The interval history is completely unremarkable.  REVIEW OF SYSTEMS: Gabrielle [name] has gained some what of her weight back since starting a Boost supplement daily. She denies fevers, chills, nausea, vomiting, or changes in bowel or bladder habits. She continues to have chronic fatigue. She is a regular smoker.  She is short of breath with exertion and has a chronic cough that is occasionally productive. She has occasional palpitations. She denies headaches, or dizziness, but complains of weakness. A detailed review of systems  is otherwise stable. Her right ring finger trembles on its own. She can not control it or make it stop. The rest of her finger and hands are normal. A detailed review of systems is otherwise stable.   PAST MEDICAL HISTORY: Past Medical History  Diagnosis Date  . Cataract   . Retina hole   . COPD (chronic obstructive pulmonary disease)   . Macular degeneration   . Anxiety   . Breast cancer of upper-outer quadrant of right female breast   . Complication of anesthesia     nausea and vomiting   . PONV (postoperative nausea and vomiting)   . Shortness of breath     increased activity   . Pneumonia     hx of     PAST SURGICAL HISTORY: Past Surgical History  Procedure Laterality Date  . Cataract extraction Bilateral   . Retinal laser procedure    . Abdominal hysterectomy  1980    (ovaries intact)  . Appendectomy    . Total mastectomy Right 12/18/2013    Procedure: RIGHT TOTAL MASTECTOMY;  Surgeon: Gabrielle Seltzer, MD;  Location: [facility];  Service: General;  Laterality: Right;    FAMILY HISTORY Family History  Problem Relation Age of Onset  . Heart attack Mother     Deceased  . Cancer Mother     fallopian tube cancer in 76s; deceased 69  . Throat cancer Father     Deceased 19; smoker  . Prostate cancer Brother 23    Currently 32  . Other Brother     Deceased, hemorhhage of pancreas  . Leukemia Maternal Aunt   . Breast cancer Paternal Aunt     age at diagnosis unknown  . Throat cancer Paternal Uncle     Deceased 23s; smoker  . Leukemia Paternal Grandmother    the patient's father died at the age of 73 from throat cancer which has been diagnosed to years before. The patient's mother was diagnosed at age 109 with fallopian tube carcinoma. She died at age 94. There is no other history of breast or ovarian cancer in the family to the patient's knowledge  GYNECOLOGIC HISTORY:  No LMP recorded. Patient has had a hysterectomy. Menarche age 86, first live birth age 29. The  patient is GX P2. She underwent hysterectomy at a relatively young age, without salpingo-oophorectomy. She did not take hormone replacement however she took birth control pills remotely for approximately 2 years with no complications.  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 She is divorced. Her son Senetra Dillin lives in [place] and works as a Metallurgist. Daughter Tiffanyann Deroo lives in [place] where she works as a Architect for the [employer]. Incidentally Dr. Kathe Becton is a nephew of the patient    ADVANCED DIRECTIVES: In place; the patient's son can is her healthcare power of attorney. He can be reached at [place]: Social History  Substance Use Topics  . Smoking status: Current Every Day Smoker -- 0.50 packs/day for 40 years    Types: Cigarettes  . Smokeless tobacco: Never Used  . Alcohol Use: 1.2 oz/week    2 Glasses of wine per week     Comment: wine a couple  times weekly     Colonoscopy: Never  [name]:  Bone density: 2014  Lipid panel:  No Known Allergies  Current Outpatient Prescriptions  Medication Sig Dispense Refill  . Cholecalciferol ([employer] D3) 5000 UNITS capsule Take 5,000 Units by mouth every other day.     . Cyanocobalamin (VITAMIN B-12 PO) Take 1 tablet by mouth daily.     Marland Kitchen donepezil (ARICEPT) 5 MG tablet Take 1 tablet daily for one month, then increase to 2 tablets daily. (Patient taking differently: Take 5 mg by mouth. Pt takes 2 tablets daily.) 60 tablet 5  . Tiotropium Bromide Monohydrate (SPIRIVA RESPIMAT) 2.5 MCG/ACT AERS Inhale 2 puffs into the lungs as needed. @@ 11 am    . vitamin A 10000 UNIT capsule Take 10,000 Units by mouth every morning.     Marland Kitchen anastrozole (ARIMIDEX) 1 MG tablet Take 1 tablet (1 mg total) by mouth daily. 90 tablet 3  . fluticasone (FLONASE) 50 MCG/ACT nasal spray Place into both nostrils daily as needed.      No current  facility-administered medications for this visit.    OBJECTIVE: Elderly white woman in no acute distress Filed Vitals:   12/19/14 1404  BP: 131/47  Pulse: 95  Temp: 98.4 F (36.9 C)  Resp: 18     Body mass index is 18.26 kg/(m^2).    ECOG FS:1 - Symptomatic but completely ambulatory   Sclerae unicteric, pupils round and equal Oropharynx clear and moist-- no thrush or other lesions No cervical or supraclavicular adenopathy Lungs no rales or rhonchi Heart regular rate and rhythm Abd soft, nontender, positive bowel sounds MSK no focal spinal tenderness, no upper extremity lymphedema Neuro: nonfocal, well oriented, appropriate affect Breasts: right breast status post mastectomy. No evidence of recurrent disease. Right axilla benign. Left breast unremarkable.  LAB RESULTS:  CMP     Component Value Date/Time   NA 142 12/19/2014 1350   NA 136* 12/19/2013 0425   K 3.9 12/19/2014 1350   K 4.7 12/19/2013 0425   CL 103 12/19/2013 0425   CO2 30* 12/19/2014 1350   CO2 24 12/19/2013 0425   GLUCOSE 167* 12/19/2014 1350   GLUCOSE 90 12/19/2013 0425   BUN 16.2 12/19/2014 1350   BUN 17 12/19/2013 0425   CREATININE 1.0 12/19/2014 1350   CREATININE 0.71 12/19/2013 0425   CALCIUM 9.4 12/19/2014 1350   CALCIUM 8.1* 12/19/2013 0425   PROT 6.4 12/19/2014 1350   PROT 6.1 10/05/2013 1158   ALBUMIN 3.5 12/19/2014 1350   ALBUMIN 3.4* 10/05/2013 1158   AST 27 12/19/2014 1350   AST 23 10/05/2013 1158   ALT 28 12/19/2014 1350   ALT 17 10/05/2013 1158   ALKPHOS 81 12/19/2014 1350   ALKPHOS 66 10/05/2013 1158   BILITOT 0.49 12/19/2014 1350   BILITOT 0.8 10/05/2013 1158   GFRNONAA 79* 12/19/2013 0425   GFRAA >90 12/19/2013 0425    I No results found for: SPEP  Lab Results  Component Value Date   WBC 7.7 12/19/2014   NEUTROABS 5.4 12/19/2014   HGB 14.3 12/19/2014   HCT 43.0 12/19/2014   MCV 99.1 12/19/2014   PLT 209 12/19/2014      Chemistry      Component Value Date/Time    NA 142 12/19/2014 1350   NA 136* 12/19/2013 0425   K 3.9 12/19/2014 1350   K 4.7 12/19/2013 0425   CL 103 12/19/2013 0425   CO2 30* 12/19/2014 1350   CO2 24 12/19/2013 0425  BUN 16.2 12/19/2014 1350   BUN 17 12/19/2013 0425   CREATININE 1.0 12/19/2014 1350   CREATININE 0.71 12/19/2013 0425      Component Value Date/Time   CALCIUM 9.4 12/19/2014 1350   CALCIUM 8.1* 12/19/2013 0425   ALKPHOS 81 12/19/2014 1350   ALKPHOS 66 10/05/2013 1158   AST 27 12/19/2014 1350   AST 23 10/05/2013 1158   ALT 28 12/19/2014 1350   ALT 17 10/05/2013 1158   BILITOT 0.49 12/19/2014 1350   BILITOT 0.8 10/05/2013 1158       No results found for: LABCA2  No components found for: LABCA125  No results for input(s): INR in the last 168 hours.  Urinalysis    Component Value Date/Time   COLORURINE YELLOW 10/05/2013 1158   APPEARANCEUR Cloudy* 10/05/2013 1158   LABSPEC 1.010 10/05/2013 1158   PHURINE 8.5* 10/05/2013 1158   GLUCOSEU NEGATIVE 10/05/2013 1158   HGBUR NEGATIVE 10/05/2013 1158   BILIRUBINUR neg 01/22/2014 1203   BILIRUBINUR NEGATIVE 10/05/2013 1158   KETONESUR NEGATIVE 10/05/2013 1158   PROTEINUR neg 01/22/2014 1203   UROBILINOGEN 0.2 01/22/2014 1203   UROBILINOGEN 0.2 10/05/2013 1158   NITRITE positive 01/22/2014 1203   NITRITE NEGATIVE 10/05/2013 1158   LEUKOCYTESUR small (1+) 01/22/2014 1203    STUDIES: Dg Bone Density  11/26/2014  CLINICAL DATA:  81 year old postmenopausal female not currently taking supplemental calcium. Taking 500 international units vitamin-D per day. Currently on Arimidex. EXAM: DUAL X-RAY ABSORPTIOMETRY (DXA) FOR BONE MINERAL DENSITY FINDINGS: AP LUMBAR SPINE L1-L4 Bone Mineral Density (BMD):  1.083 g/cm2 Young Adult T-Score:  -0.9 Z-Score:  1.0 Left FEMUR neck Bone Mineral Density (BMD):  0.642 g/cm2 Young Adult T-Score: -2.8 Z-Score:  -0.6 ASSESSMENT: Patient's diagnostic category is osteoporosis by WHO Criteria. FRACTURE RISK: Increased FRAX: World  Health Organization FRAX assessment of absolute fracture risk is not calculated for this patient because the patient has osteoporosis. COMPARISON: None. Effective therapies are available in the form of bisphosphonates, selective estrogen receptor modulators, biologic agents, and hormone replacement therapy (for women). All patients should ensure an adequate intake of dietary calcium (1200 mg daily) and vitamin D (800 IU daily) unless contraindicated. All treatment decisions require clinical judgment and consideration of individual patient factors, including patient preferences, co-morbidities, previous drug use, risk factors not captured in the FRAX model (e.g., frailty, falls, vitamin D deficiency, increased bone turnover, interval significant decline in bone density) and possible under- or over-estimation of fracture risk by FRAX. The National Osteoporosis Foundation recommends that FDA-approved medical therapies be considered in postmenopausal women and men age 74 or older with a: 1. Hip or vertebral (clinical or morphometric) fracture. 2. T-score of -2.5 or lower at the spine or hip. 3. Ten-year fracture probability by FRAX of 3% or greater for hip fracture or 20% or greater for major osteoporotic fracture. People with diagnosed cases of osteoporosis or at high risk for fracture should have regular bone mineral density tests. For patients eligible for Medicare, routine testing is allowed once every 2 years. The testing frequency can be increased to one year for patients who have rapidly progressing disease, those who are receiving or discontinuing medical therapy to restore bone mass, or have additional risk factors. World Pharmacologist [facility]) Criteria: Normal: T-scores from +1.0 to -1.0 Low Bone Mass (Osteopenia): T-scores between -1.0 and -2.5 Osteoporosis: T-scores -2.5 and below Comparison to Reference Population: T-score is the key measure used in the diagnosis of osteoporosis and relative risk  determination for fracture. It provides  a value for bone mass relative to the mean bone mass of a young adult reference population expressed in terms of standard deviation (SD). Z-score is the age-matched score showing the patient's values compared to a population matched for age, sex, and race. This is also expressed in terms of standard deviation. The patient may have values that compare favorably to the age-matched values and still be at increased risk for fracture. Electronically Signed   By: Conchita Paris M.D.   On: 11/26/2014 16:45   Mm Screening Breast Tomo Uni L  11/26/2014  CLINICAL DATA:  Screening. EXAM: DIGITAL SCREENING UNILATERAL LEFT MAMMOGRAM WITH TOMO AND CAD COMPARISON:  Previous exam(s). ACR Breast Density Category c: The breast tissue is heterogeneously dense, which may obscure small masses. FINDINGS: There are no findings suspicious for malignancy. Images were processed with CAD. IMPRESSION: No mammographic evidence of malignancy. A result letter of this screening mammogram will be mailed directly to the patient. RECOMMENDATION: Screening mammogram in one year. (Code:SM-B-01Y) BI-RADS CATEGORY  1: Negative. Electronically Signed   By: Lovey Newcomer M.D.   On: 11/26/2014 08:13    ASSESSMENT: 81 y.o. BRCA negative [place] woman status post right breast upper outer quadrant biopsy 11/26/2013 for a clinical T2 N0, stage IIA invasive ductal carcinoma, grade 2, with micropapillary features, triple positive, with an MIB-1 of 31%  (1) right simple mastectomy 12/18/2013 showed a pT2 pNX, stage II invasive ductal carcinoma, grade 2, with negative margins.  (2) 6 mm right lower lobe nodule noted on CT scans 11/19/2013  (a) continuing tobacco abuse-- transitioning to e-cigarettes  (b) emphysema  (c)  Repeat chest CT 06/27/2014 stable  (3) unexplained/ undocumented weight loss   (4) started anastrozole 01/03/2014  (5) started trastuzumab 02/05/2014; to be continued for one year  (a)  echo  10/14/2014 shows an EF of 60-65%   (6) tobacco abuse disorder: the patient has been strongly advised to discontinue smoking  (7) osteoporosis- to begin Zometa in January 2017  PLAN: Somehow the trastuzumab infusion was left off of Katheen's schedule today. So we will simply reschedule this for next week. She will be due for a repeat echocardigram in late November, so I have placed orders for that to be performed at that time.  We reviewed the result of her bone density scan. With a t-score of -2.8 she is considered to have osteoporosis. She is already on a 1000 unit vitamin D supplement daily. I would like to add zometa to this every 6 months for the first 2 years, then yearly there after. We discussed the potential for flu like aches for up to a week after treatment. I advised her to take 2 TUMS the day before and the day of the infusion.   Forest will continue her trastuzumab every 3 weeks. Her next office visit will be in January and we would like to start bisphosphonate therapy then as well. By this time she has agreed to make made a dentist appointment to receive permission to start zometa.  She understands and agrees with this plan. She knows the goal of treatment in her case is cure. She has been encouraged to call with any issues that might arise before her next visit here.  Laurie Panda, NP   12/19/2014 3:26 PM

## 2014-12-20 ENCOUNTER — Encounter: Payer: Self-pay | Admitting: Nurse Practitioner

## 2014-12-20 ENCOUNTER — Telehealth: Payer: Self-pay | Admitting: Nurse Practitioner

## 2014-12-20 DIAGNOSIS — M810 Age-related osteoporosis without current pathological fracture: Secondary | ICD-10-CM | POA: Insufficient documentation

## 2014-12-20 NOTE — Telephone Encounter (Signed)
Lm for patient to call dr patels office as it looks like they were needing to reschdule her appointment

## 2014-12-23 ENCOUNTER — Telehealth (HOSPITAL_COMMUNITY): Payer: Self-pay | Admitting: *Deleted

## 2014-12-27 ENCOUNTER — Other Ambulatory Visit: Payer: Self-pay | Admitting: Hematology and Oncology

## 2014-12-27 ENCOUNTER — Ambulatory Visit (HOSPITAL_BASED_OUTPATIENT_CLINIC_OR_DEPARTMENT_OTHER): Payer: Medicare HMO

## 2014-12-27 ENCOUNTER — Other Ambulatory Visit (HOSPITAL_BASED_OUTPATIENT_CLINIC_OR_DEPARTMENT_OTHER): Payer: Medicare HMO

## 2014-12-27 VITALS — BP 120/64 | HR 65 | Temp 98.8°F | Resp 16

## 2014-12-27 DIAGNOSIS — C50411 Malignant neoplasm of upper-outer quadrant of right female breast: Secondary | ICD-10-CM

## 2014-12-27 DIAGNOSIS — Z5112 Encounter for antineoplastic immunotherapy: Secondary | ICD-10-CM | POA: Diagnosis not present

## 2014-12-27 DIAGNOSIS — F17210 Nicotine dependence, cigarettes, uncomplicated: Secondary | ICD-10-CM

## 2014-12-27 DIAGNOSIS — C50919 Malignant neoplasm of unspecified site of unspecified female breast: Secondary | ICD-10-CM

## 2014-12-27 LAB — COMPREHENSIVE METABOLIC PANEL (CC13)
ALT: 24 U/L (ref 0–55)
ANION GAP: 7 meq/L (ref 3–11)
AST: 23 U/L (ref 5–34)
Albumin: 3.6 g/dL (ref 3.5–5.0)
Alkaline Phosphatase: 71 U/L (ref 40–150)
BUN: 17.9 mg/dL (ref 7.0–26.0)
CHLORIDE: 109 meq/L (ref 98–109)
CO2: 27 meq/L (ref 22–29)
CREATININE: 0.8 mg/dL (ref 0.6–1.1)
Calcium: 9.6 mg/dL (ref 8.4–10.4)
EGFR: 69 mL/min/{1.73_m2} — ABNORMAL LOW (ref >90)
Glucose: 91 mg/dl (ref 70–140)
POTASSIUM: 3.8 meq/L (ref 3.5–5.1)
Sodium: 143 mEq/L (ref 136–145)
Total Bilirubin: 0.87 mg/dL (ref 0.20–1.20)
Total Protein: 6.5 g/dL (ref 6.4–8.3)

## 2014-12-27 LAB — CBC WITH DIFFERENTIAL/PLATELET
BASO%: 1.2 % (ref 0.0–2.0)
BASOS ABS: 0.1 10*3/uL (ref 0.0–0.1)
EOS%: 1 % (ref 0.0–7.0)
Eosinophils Absolute: 0.1 10*3/uL (ref 0.0–0.5)
HCT: 41.6 % (ref 34.8–46.6)
HGB: 13.9 g/dL (ref 11.6–15.9)
LYMPH%: 28.6 % (ref 14.0–49.7)
MCH: 33.1 pg (ref 25.1–34.0)
MCHC: 33.5 g/dL (ref 31.5–36.0)
MCV: 98.6 fL (ref 79.5–101.0)
MONO#: 0.6 10*3/uL (ref 0.1–0.9)
MONO%: 9.4 % (ref 0.0–14.0)
NEUT#: 3.6 10*3/uL (ref 1.5–6.5)
NEUT%: 59.8 % (ref 38.4–76.8)
Platelets: 215 10*3/uL (ref 145–400)
RBC: 4.21 10*6/uL (ref 3.70–5.45)
RDW: 12.8 % (ref 11.2–14.5)
WBC: 6.1 10*3/uL (ref 3.9–10.3)
lymph#: 1.7 10*3/uL (ref 0.9–3.3)

## 2014-12-27 MED ORDER — ACETAMINOPHEN 325 MG PO TABS
650.0000 mg | ORAL_TABLET | Freq: Once | ORAL | Status: AC
  Administered 2014-12-27: 650 mg via ORAL

## 2014-12-27 MED ORDER — DIPHENHYDRAMINE HCL 25 MG PO CAPS
ORAL_CAPSULE | ORAL | Status: AC
  Filled 2014-12-27: qty 1

## 2014-12-27 MED ORDER — TRASTUZUMAB CHEMO INJECTION 440 MG
6.0000 mg/kg | Freq: Once | INTRAVENOUS | Status: AC
  Administered 2014-12-27: 315 mg via INTRAVENOUS
  Filled 2014-12-27: qty 15

## 2014-12-27 MED ORDER — HEPARIN SOD (PORK) LOCK FLUSH 100 UNIT/ML IV SOLN
500.0000 [IU] | Freq: Once | INTRAVENOUS | Status: DC | PRN
  Filled 2014-12-27: qty 5

## 2014-12-27 MED ORDER — ACETAMINOPHEN 325 MG PO TABS
ORAL_TABLET | ORAL | Status: AC
  Filled 2014-12-27: qty 2

## 2014-12-27 MED ORDER — SODIUM CHLORIDE 0.9 % IV SOLN
Freq: Once | INTRAVENOUS | Status: AC
  Administered 2014-12-27: 15:00:00 via INTRAVENOUS

## 2014-12-27 MED ORDER — SODIUM CHLORIDE 0.9 % IJ SOLN
10.0000 mL | INTRAMUSCULAR | Status: DC | PRN
  Filled 2014-12-27: qty 10

## 2014-12-27 MED ORDER — DIPHENHYDRAMINE HCL 25 MG PO CAPS
25.0000 mg | ORAL_CAPSULE | Freq: Once | ORAL | Status: AC
  Administered 2014-12-27: 25 mg via ORAL

## 2014-12-27 NOTE — Patient Instructions (Signed)
[facility] Discharge Instructions for Patients Receiving Chemotherapy  Today you received the following chemotherapy agents herceptin   To help prevent nausea and vomiting after your treatment, we encourage you to take your nausea medication as directed   If you develop nausea and vomiting that is not controlled by your nausea medication, call the clinic.   BELOW ARE SYMPTOMS THAT SHOULD BE REPORTED IMMEDIATELY:  *FEVER GREATER THAN 100.5 F  *CHILLS WITH OR WITHOUT FEVER  NAUSEA AND VOMITING THAT IS NOT CONTROLLED WITH YOUR NAUSEA MEDICATION  *UNUSUAL SHORTNESS OF BREATH  *UNUSUAL BRUISING OR BLEEDING  TENDERNESS IN MOUTH AND THROAT WITH OR WITHOUT PRESENCE OF ULCERS  *URINARY PROBLEMS  *BOWEL PROBLEMS  UNUSUAL RASH Items with * indicate a potential emergency and should be followed up as soon as possible.  Feel free to call the clinic you have any questions or concerns. The clinic phone number is [phone].  

## 2015-01-16 ENCOUNTER — Ambulatory Visit (HOSPITAL_BASED_OUTPATIENT_CLINIC_OR_DEPARTMENT_OTHER): Payer: Medicare HMO

## 2015-01-16 ENCOUNTER — Other Ambulatory Visit: Payer: Self-pay | Admitting: Oncology

## 2015-01-16 ENCOUNTER — Other Ambulatory Visit (HOSPITAL_BASED_OUTPATIENT_CLINIC_OR_DEPARTMENT_OTHER): Payer: Medicare HMO

## 2015-01-16 VITALS — BP 139/64 | Temp 98.1°F | Resp 16

## 2015-01-16 DIAGNOSIS — Z5112 Encounter for antineoplastic immunotherapy: Secondary | ICD-10-CM | POA: Diagnosis not present

## 2015-01-16 DIAGNOSIS — C50411 Malignant neoplasm of upper-outer quadrant of right female breast: Secondary | ICD-10-CM

## 2015-01-16 DIAGNOSIS — F17210 Nicotine dependence, cigarettes, uncomplicated: Secondary | ICD-10-CM

## 2015-01-16 DIAGNOSIS — C50919 Malignant neoplasm of unspecified site of unspecified female breast: Secondary | ICD-10-CM

## 2015-01-16 LAB — CBC WITH DIFFERENTIAL/PLATELET
BASO%: 0.6 % (ref 0.0–2.0)
Basophils Absolute: 0.1 10*3/uL (ref 0.0–0.1)
EOS%: 0.6 % (ref 0.0–7.0)
Eosinophils Absolute: 0.1 10*3/uL (ref 0.0–0.5)
HCT: 41.3 % (ref 34.8–46.6)
HGB: 14.1 g/dL (ref 11.6–15.9)
LYMPH#: 2.3 10*3/uL (ref 0.9–3.3)
LYMPH%: 28.5 % (ref 14.0–49.7)
MCH: 33.3 pg (ref 25.1–34.0)
MCHC: 34.1 g/dL (ref 31.5–36.0)
MCV: 97.4 fL (ref 79.5–101.0)
MONO#: 0.7 10*3/uL (ref 0.1–0.9)
MONO%: 9.3 % (ref 0.0–14.0)
NEUT#: 4.9 10*3/uL (ref 1.5–6.5)
NEUT%: 61 % (ref 38.4–76.8)
Platelets: 212 10*3/uL (ref 145–400)
RBC: 4.24 10*6/uL (ref 3.70–5.45)
RDW: 12.9 % (ref 11.2–14.5)
WBC: 8 10*3/uL (ref 3.9–10.3)
nRBC: 0 % (ref 0–0)

## 2015-01-16 MED ORDER — ACETAMINOPHEN 325 MG PO TABS
ORAL_TABLET | ORAL | Status: AC
  Filled 2015-01-16: qty 2

## 2015-01-16 MED ORDER — TRASTUZUMAB CHEMO INJECTION 440 MG
6.0000 mg/kg | Freq: Once | INTRAVENOUS | Status: AC
  Administered 2015-01-16: 315 mg via INTRAVENOUS
  Filled 2015-01-16: qty 15

## 2015-01-16 MED ORDER — ACETAMINOPHEN 325 MG PO TABS
650.0000 mg | ORAL_TABLET | Freq: Once | ORAL | Status: AC
  Administered 2015-01-16: 650 mg via ORAL

## 2015-01-16 MED ORDER — SODIUM CHLORIDE 0.9 % IV SOLN
Freq: Once | INTRAVENOUS | Status: AC
  Administered 2015-01-16: 15:00:00 via INTRAVENOUS

## 2015-01-16 MED ORDER — DIPHENHYDRAMINE HCL 25 MG PO CAPS
25.0000 mg | ORAL_CAPSULE | Freq: Once | ORAL | Status: AC
  Administered 2015-01-16: 25 mg via ORAL

## 2015-01-16 MED ORDER — DIPHENHYDRAMINE HCL 25 MG PO CAPS
ORAL_CAPSULE | ORAL | Status: AC
  Filled 2015-01-16: qty 1

## 2015-01-16 NOTE — Patient Instructions (Signed)
Trastuzumab injection for infusion What is this medicine? TRASTUZUMAB (tras TOO zoo mab) is a monoclonal antibody. It is used to treat breast cancer and stomach cancer. This medicine may be used for other purposes; ask your health care provider or pharmacist if you have questions. What should I tell my health care provider before I take this medicine? They need to know if you have any of these conditions: -heart disease -heart failure -infection (especially a virus infection such as chickenpox, cold sores, or herpes) -lung or breathing disease, like asthma -recent or ongoing radiation therapy -an unusual or allergic reaction to trastuzumab, benzyl alcohol, or other medications, foods, dyes, or preservatives -pregnant or trying to get pregnant -breast-feeding How should I use this medicine? This drug is given as an infusion into a vein. It is administered in a hospital or clinic by a specially trained health care professional. Talk to your pediatrician regarding the use of this medicine in children. This medicine is not approved for use in children. Overdosage: If you think you have taken too much of this medicine contact a poison control center or emergency room at once. NOTE: This medicine is only for you. Do not share this medicine with others. What if I miss a dose? It is important not to miss a dose. Call your doctor or health care professional if you are unable to keep an appointment. What may interact with this medicine? -doxorubicin -warfarin This list may not describe all possible interactions. Give your health care provider a list of all the medicines, herbs, non-prescription drugs, or dietary supplements you use. Also tell them if you smoke, drink alcohol, or use illegal drugs. Some items may interact with your medicine. What should I watch for while using this medicine? Visit your doctor for checks on your progress. Report any side effects. Continue your course of treatment even  though you feel ill unless your doctor tells you to stop. Call your doctor or health care professional for advice if you get a fever, chills or sore throat, or other symptoms of a cold or flu. Do not treat yourself. Try to avoid being around people who are sick. You may experience fever, chills and shaking during your first infusion. These effects are usually mild and can be treated with other medicines. Report any side effects during the infusion to your health care professional. Fever and chills usually do not happen with later infusions. Do not become pregnant while taking this medicine or for 7 months after stopping it. Women should inform their doctor if they wish to become pregnant or think they might be pregnant. Women of child-bearing potential will need to have a negative pregnancy test before starting this medicine. There is a potential for serious side effects to an unborn child. Talk to your health care professional or pharmacist for more information. Do not breast-feed an infant while taking this medicine or for 7 months after stopping it. Women must use effective birth control with this medicine. What side effects may I notice from receiving this medicine? Side effects that you should report to your doctor or other health care professional as soon as possible: -breathing difficulties -chest pain or palpitations -cough -dizziness or fainting -fever or chills, sore throat -skin rash, itching or hives -swelling of the legs or ankles -unusually weak or tired Side effects that usually do not require medical attention (report to your doctor or other health care professional if they continue or are bothersome): -loss of appetite -headache -muscle aches -nausea This   list may not describe all possible side effects. Call your doctor for medical advice about side effects. You may report side effects to FDA at 1-800-FDA-1088. Where should I keep my medicine? This drug is given in a hospital  or clinic and will not be stored at home. NOTE: This sheet is a summary. It may not cover all possible information. If you have questions about this medicine, talk to your doctor, pharmacist, or health care provider.    2016, Elsevier/Gold Standard. (2014-05-24 11:49:32)   

## 2015-01-18 ENCOUNTER — Other Ambulatory Visit: Payer: Self-pay | Admitting: Oncology

## 2015-01-28 ENCOUNTER — Telehealth: Payer: Self-pay | Admitting: Oncology

## 2015-01-28 NOTE — Telephone Encounter (Signed)
Left message on voicemail for patient re echo appointment for 12/2 @ 10 am @ [facility]. Called son but was not able to reach him or leave message. Schedule mailed. Per Vaughan Basta [phone]

## 2015-01-31 ENCOUNTER — Ambulatory Visit (HOSPITAL_COMMUNITY)
Admission: RE | Admit: 2015-01-31 | Discharge: 2015-01-31 | Disposition: A | Discharge status: Home / Self Care | Payer: Commercial Managed Care - HMO | Source: Ambulatory Visit | Attending: Nurse Practitioner | Admitting: Nurse Practitioner

## 2015-01-31 DIAGNOSIS — J449 Chronic obstructive pulmonary disease, unspecified: Secondary | ICD-10-CM | POA: Diagnosis not present

## 2015-01-31 DIAGNOSIS — Z720 Tobacco use: Secondary | ICD-10-CM | POA: Diagnosis not present

## 2015-01-31 DIAGNOSIS — C50411 Malignant neoplasm of upper-outer quadrant of right female breast: Secondary | ICD-10-CM | POA: Diagnosis not present

## 2015-01-31 NOTE — Progress Notes (Signed)
  Echocardiogram 2D Echocardiogram has been performed.  Gabrielle [name] 01/31/2015, 10:49 AM

## 2015-02-06 ENCOUNTER — Ambulatory Visit (HOSPITAL_BASED_OUTPATIENT_CLINIC_OR_DEPARTMENT_OTHER): Payer: Medicare HMO

## 2015-02-06 ENCOUNTER — Other Ambulatory Visit (HOSPITAL_BASED_OUTPATIENT_CLINIC_OR_DEPARTMENT_OTHER): Payer: Medicare HMO

## 2015-02-06 VITALS — BP 114/46 | HR 65 | Temp 98.0°F | Resp 20

## 2015-02-06 DIAGNOSIS — F17210 Nicotine dependence, cigarettes, uncomplicated: Secondary | ICD-10-CM

## 2015-02-06 DIAGNOSIS — C50411 Malignant neoplasm of upper-outer quadrant of right female breast: Secondary | ICD-10-CM

## 2015-02-06 DIAGNOSIS — Z5112 Encounter for antineoplastic immunotherapy: Secondary | ICD-10-CM | POA: Diagnosis not present

## 2015-02-06 DIAGNOSIS — C50919 Malignant neoplasm of unspecified site of unspecified female breast: Secondary | ICD-10-CM

## 2015-02-06 LAB — CBC WITH DIFFERENTIAL/PLATELET
BASO%: 0.6 % (ref 0.0–2.0)
Basophils Absolute: 0 10*3/uL (ref 0.0–0.1)
EOS%: 0.9 % (ref 0.0–7.0)
Eosinophils Absolute: 0.1 10*3/uL (ref 0.0–0.5)
HCT: 41.3 % (ref 34.8–46.6)
HEMOGLOBIN: 13.9 g/dL (ref 11.6–15.9)
LYMPH%: 29.5 % (ref 14.0–49.7)
MCH: 33.5 pg (ref 25.1–34.0)
MCHC: 33.7 g/dL (ref 31.5–36.0)
MCV: 99.5 fL (ref 79.5–101.0)
MONO#: 0.7 10*3/uL (ref 0.1–0.9)
MONO%: 9.6 % (ref 0.0–14.0)
NEUT%: 59.4 % (ref 38.4–76.8)
NEUTROS ABS: 4.1 10*3/uL (ref 1.5–6.5)
Platelets: 206 10*3/uL (ref 145–400)
RBC: 4.15 10*6/uL (ref 3.70–5.45)
RDW: 13 % (ref 11.2–14.5)
WBC: 6.9 10*3/uL (ref 3.9–10.3)
lymph#: 2 10*3/uL (ref 0.9–3.3)

## 2015-02-06 LAB — COMPREHENSIVE METABOLIC PANEL
ALBUMIN: 3.6 g/dL (ref 3.5–5.0)
ALK PHOS: 86 U/L (ref 40–150)
ALT: 27 U/L (ref 0–55)
AST: 30 U/L (ref 5–34)
Anion Gap: 12 mEq/L — ABNORMAL HIGH (ref 3–11)
BILIRUBIN TOTAL: 0.81 mg/dL (ref 0.20–1.20)
BUN: 15 mg/dL (ref 7.0–26.0)
CO2: 26 mEq/L (ref 22–29)
Calcium: 9.5 mg/dL (ref 8.4–10.4)
Chloride: 104 mEq/L (ref 98–109)
Creatinine: 0.9 mg/dL (ref 0.6–1.1)
EGFR: 61 mL/min/{1.73_m2} — ABNORMAL LOW (ref >90)
GLUCOSE: 107 mg/dL (ref 70–140)
Potassium: 3.8 mEq/L (ref 3.5–5.1)
SODIUM: 142 meq/L (ref 136–145)
TOTAL PROTEIN: 6.8 g/dL (ref 6.4–8.3)

## 2015-02-06 MED ORDER — TRASTUZUMAB CHEMO INJECTION 440 MG
6.0000 mg/kg | Freq: Once | INTRAVENOUS | Status: AC
  Administered 2015-02-06: 315 mg via INTRAVENOUS
  Filled 2015-02-06: qty 15

## 2015-02-06 MED ORDER — SODIUM CHLORIDE 0.9 % IV SOLN
Freq: Once | INTRAVENOUS | Status: AC
  Administered 2015-02-06: 15:00:00 via INTRAVENOUS

## 2015-02-06 MED ORDER — ACETAMINOPHEN 325 MG PO TABS
650.0000 mg | ORAL_TABLET | Freq: Once | ORAL | Status: AC
  Administered 2015-02-06: 650 mg via ORAL

## 2015-02-06 MED ORDER — DIPHENHYDRAMINE HCL 25 MG PO CAPS
ORAL_CAPSULE | ORAL | Status: AC
  Filled 2015-02-06: qty 1

## 2015-02-06 MED ORDER — DIPHENHYDRAMINE HCL 25 MG PO CAPS
25.0000 mg | ORAL_CAPSULE | Freq: Once | ORAL | Status: AC
  Administered 2015-02-06: 25 mg via ORAL

## 2015-02-06 MED ORDER — ACETAMINOPHEN 325 MG PO TABS
ORAL_TABLET | ORAL | Status: AC
  Filled 2015-02-06: qty 2

## 2015-02-06 NOTE — Patient Instructions (Signed)
Trastuzumab injection for infusion What is this medicine? TRASTUZUMAB (tras TOO zoo mab) is a monoclonal antibody. It is used to treat breast cancer and stomach cancer. This medicine may be used for other purposes; ask your health care provider or pharmacist if you have questions. What should I tell my health care provider before I take this medicine? They need to know if you have any of these conditions: -heart disease -heart failure -infection (especially a virus infection such as chickenpox, cold sores, or herpes) -lung or breathing disease, like asthma -recent or ongoing radiation therapy -an unusual or allergic reaction to trastuzumab, benzyl alcohol, or other medications, foods, dyes, or preservatives -pregnant or trying to get pregnant -breast-feeding How should I use this medicine? This drug is given as an infusion into a vein. It is administered in a hospital or clinic by a specially trained health care professional. Talk to your pediatrician regarding the use of this medicine in children. This medicine is not approved for use in children. Overdosage: If you think you have taken too much of this medicine contact a poison control center or emergency room at once. NOTE: This medicine is only for you. Do not share this medicine with others. What if I miss a dose? It is important not to miss a dose. Call your doctor or health care professional if you are unable to keep an appointment. What may interact with this medicine? -doxorubicin -warfarin This list may not describe all possible interactions. Give your health care provider a list of all the medicines, herbs, non-prescription drugs, or dietary supplements you use. Also tell them if you smoke, drink alcohol, or use illegal drugs. Some items may interact with your medicine. What should I watch for while using this medicine? Visit your doctor for checks on your progress. Report any side effects. Continue your course of treatment even  though you feel ill unless your doctor tells you to stop. Call your doctor or health care professional for advice if you get a fever, chills or sore throat, or other symptoms of a cold or flu. Do not treat yourself. Try to avoid being around people who are sick. You may experience fever, chills and shaking during your first infusion. These effects are usually mild and can be treated with other medicines. Report any side effects during the infusion to your health care professional. Fever and chills usually do not happen with later infusions. Do not become pregnant while taking this medicine or for 7 months after stopping it. Women should inform their doctor if they wish to become pregnant or think they might be pregnant. Women of child-bearing potential will need to have a negative pregnancy test before starting this medicine. There is a potential for serious side effects to an unborn child. Talk to your health care professional or pharmacist for more information. Do not breast-feed an infant while taking this medicine or for 7 months after stopping it. Women must use effective birth control with this medicine. What side effects may I notice from receiving this medicine? Side effects that you should report to your doctor or other health care professional as soon as possible: -breathing difficulties -chest pain or palpitations -cough -dizziness or fainting -fever or chills, sore throat -skin rash, itching or hives -swelling of the legs or ankles -unusually weak or tired Side effects that usually do not require medical attention (report to your doctor or other health care professional if they continue or are bothersome): -loss of appetite -headache -muscle aches -nausea This   list may not describe all possible side effects. Call your doctor for medical advice about side effects. You may report side effects to FDA at 1-800-FDA-1088. Where should I keep my medicine? This drug is given in a hospital  or clinic and will not be stored at home. NOTE: This sheet is a summary. It may not cover all possible information. If you have questions about this medicine, talk to your doctor, pharmacist, or health care provider.    2016, Elsevier/Gold Standard. (2014-05-24 11:49:32)   

## 2015-02-20 ENCOUNTER — Other Ambulatory Visit: Payer: Self-pay | Admitting: Oncology

## 2015-02-27 ENCOUNTER — Other Ambulatory Visit: Payer: Self-pay | Admitting: *Deleted

## 2015-02-27 ENCOUNTER — Other Ambulatory Visit (HOSPITAL_BASED_OUTPATIENT_CLINIC_OR_DEPARTMENT_OTHER): Payer: Medicare HMO

## 2015-02-27 ENCOUNTER — Ambulatory Visit (HOSPITAL_BASED_OUTPATIENT_CLINIC_OR_DEPARTMENT_OTHER): Payer: Medicare HMO

## 2015-02-27 VITALS — BP 122/51 | HR 86 | Temp 98.3°F | Resp 18

## 2015-02-27 DIAGNOSIS — F17210 Nicotine dependence, cigarettes, uncomplicated: Secondary | ICD-10-CM

## 2015-02-27 DIAGNOSIS — C50411 Malignant neoplasm of upper-outer quadrant of right female breast: Secondary | ICD-10-CM

## 2015-02-27 DIAGNOSIS — C50919 Malignant neoplasm of unspecified site of unspecified female breast: Secondary | ICD-10-CM

## 2015-02-27 DIAGNOSIS — Z5112 Encounter for antineoplastic immunotherapy: Secondary | ICD-10-CM | POA: Diagnosis not present

## 2015-02-27 LAB — CBC WITH DIFFERENTIAL/PLATELET
BASO%: 0.5 % (ref 0.0–2.0)
BASOS ABS: 0 10*3/uL (ref 0.0–0.1)
EOS ABS: 0.1 10*3/uL (ref 0.0–0.5)
EOS%: 1.4 % (ref 0.0–7.0)
HEMATOCRIT: 40.9 % (ref 34.8–46.6)
HEMOGLOBIN: 13.7 g/dL (ref 11.6–15.9)
LYMPH#: 1.8 10*3/uL (ref 0.9–3.3)
LYMPH%: 24.5 % (ref 14.0–49.7)
MCH: 33.5 pg (ref 25.1–34.0)
MCHC: 33.5 g/dL (ref 31.5–36.0)
MCV: 100 fL (ref 79.5–101.0)
MONO#: 0.6 10*3/uL (ref 0.1–0.9)
MONO%: 8.5 % (ref 0.0–14.0)
NEUT#: 4.7 10*3/uL (ref 1.5–6.5)
NEUT%: 65.1 % (ref 38.4–76.8)
PLATELETS: 266 10*3/uL (ref 145–400)
RBC: 4.09 10*6/uL (ref 3.70–5.45)
RDW: 13.1 % (ref 11.2–14.5)
WBC: 7.3 10*3/uL (ref 3.9–10.3)

## 2015-02-27 MED ORDER — DIPHENHYDRAMINE HCL 25 MG PO CAPS
25.0000 mg | ORAL_CAPSULE | Freq: Once | ORAL | Status: AC
  Administered 2015-02-27: 25 mg via ORAL

## 2015-02-27 MED ORDER — ACETAMINOPHEN 325 MG PO TABS
ORAL_TABLET | ORAL | Status: AC
  Filled 2015-02-27: qty 2

## 2015-02-27 MED ORDER — ACETAMINOPHEN 325 MG PO TABS
650.0000 mg | ORAL_TABLET | Freq: Once | ORAL | Status: AC
  Administered 2015-02-27: 650 mg via ORAL

## 2015-02-27 MED ORDER — SODIUM CHLORIDE 0.9 % IV SOLN
6.0000 mg/kg | Freq: Once | INTRAVENOUS | Status: AC
  Administered 2015-02-27: 315 mg via INTRAVENOUS
  Filled 2015-02-27: qty 15

## 2015-02-27 MED ORDER — SODIUM CHLORIDE 0.9 % IV SOLN
Freq: Once | INTRAVENOUS | Status: AC
  Administered 2015-02-27: 15:00:00 via INTRAVENOUS

## 2015-02-27 MED ORDER — DIPHENHYDRAMINE HCL 25 MG PO CAPS
ORAL_CAPSULE | ORAL | Status: AC
  Filled 2015-02-27: qty 1

## 2015-02-27 MED ORDER — ANASTROZOLE 1 MG PO TABS: 1.0000 mg | ORAL_TABLET | Freq: Every day | ORAL | Status: DC

## 2015-02-27 NOTE — Patient Instructions (Signed)
[facility] Discharge Instructions for Patients Receiving Chemotherapy  Today you received the following chemotherapy agents:  Herceptin  To help prevent nausea and vomiting after your treatment, we encourage you to take your nausea medication as ordered per MD.   If you develop nausea and vomiting that is not controlled by your nausea medication, call the clinic.   BELOW ARE SYMPTOMS THAT SHOULD BE REPORTED IMMEDIATELY:  *FEVER GREATER THAN 100.5 F  *CHILLS WITH OR WITHOUT FEVER  NAUSEA AND VOMITING THAT IS NOT CONTROLLED WITH YOUR NAUSEA MEDICATION  *UNUSUAL SHORTNESS OF BREATH  *UNUSUAL BRUISING OR BLEEDING  TENDERNESS IN MOUTH AND THROAT WITH OR WITHOUT PRESENCE OF ULCERS  *URINARY PROBLEMS  *BOWEL PROBLEMS  UNUSUAL RASH Items with * indicate a potential emergency and should be followed up as soon as possible.  Feel free to call the clinic you have any questions or concerns. The clinic phone number is [phone].  Please show the CHEMO ALERT CARD at check-in to the Emergency Department and triage nurse.   

## 2015-03-06 ENCOUNTER — Ambulatory Visit: Payer: Commercial Managed Care - HMO | Admitting: Neurology

## 2015-03-17 ENCOUNTER — Other Ambulatory Visit: Payer: Self-pay | Admitting: *Deleted

## 2015-03-18 ENCOUNTER — Telehealth: Payer: Self-pay | Admitting: *Deleted

## 2015-03-18 NOTE — Telephone Encounter (Signed)
Called pt to find out had she been to her dentist yet for checkup as she is supposed to start Zometa on 1/19. Pt told me she has not made an appt yet. She was not sure what to tell dentist. I will set this appt up for her at Dr. Gerlene Burdock and let the patient know when the appt is. I have since called Dr. Arlana Lindau office and pt has not seen dentist since 2011. An appt has been made for 1/24 @ 10:40a for X-rays and a consult. This is the earliest available. Depending on the X-rays, an add'l appt made have to be made if any work needs to be done thus postponing her Zometa tx. I explained to pt this has to be done before getting the treatment. Pt verbalizes understanding. Message to be fwd to [employer]

## 2015-03-19 ENCOUNTER — Other Ambulatory Visit: Payer: Self-pay | Admitting: Nurse Practitioner

## 2015-03-19 ENCOUNTER — Telehealth: Payer: Self-pay | Admitting: *Deleted

## 2015-03-19 ENCOUNTER — Other Ambulatory Visit: Payer: Self-pay

## 2015-03-19 DIAGNOSIS — C50411 Malignant neoplasm of upper-outer quadrant of right female breast: Secondary | ICD-10-CM

## 2015-03-19 NOTE — Telephone Encounter (Signed)
Called pt to let her know that appts have been cancelled. No answer but left a detailed message for pt that her Zometa will not start until she has been cleared from dentist. Her appt with  Dr. Gerlene Burdock is 03/25/15. After clearance we will get her scheduled again. Pt knows to call this nurse if she has any questions. Message to be fwd to Gentry Fitz, NP.

## 2015-03-20 ENCOUNTER — Other Ambulatory Visit: Payer: Commercial Managed Care - HMO

## 2015-03-20 ENCOUNTER — Ambulatory Visit: Payer: Commercial Managed Care - HMO

## 2015-03-20 ENCOUNTER — Ambulatory Visit: Payer: Commercial Managed Care - HMO | Admitting: Oncology

## 2015-03-31 ENCOUNTER — Telehealth: Payer: Self-pay | Admitting: *Deleted

## 2015-03-31 NOTE — Telephone Encounter (Signed)
Pt returned my phone call from Friday. Pt did go to dentist on Jan 24 and I will call to get a written report from Dr. Arlana Lindau office  to assure that things are well with her dental health so she can proceed with the Zometa tx's. Called office but they were closed, I will call  back on next business day. Message to be fwd to Gentry Fitz, NP.

## 2015-04-01 ENCOUNTER — Other Ambulatory Visit: Payer: Self-pay | Admitting: Nurse Practitioner

## 2015-04-01 NOTE — Telephone Encounter (Signed)
Called dentist office to f/u about the outcome of pt's dental exam and asked them to send [place] a letter stating the condition of her oral health. Received letter in office today from Dr. Arlana Lindau office.

## 2015-04-02 ENCOUNTER — Other Ambulatory Visit: Payer: Self-pay | Admitting: *Deleted

## 2015-04-07 ENCOUNTER — Telehealth: Payer: Self-pay | Admitting: Oncology

## 2015-04-07 NOTE — Telephone Encounter (Signed)
Left message for patient re 4/12 lab/uf. Schedule mailed.

## 2015-04-09 ENCOUNTER — Telehealth: Payer: Self-pay | Admitting: *Deleted

## 2015-04-09 NOTE — Telephone Encounter (Signed)
Called pt to see has she set up appt to have the dental work done that Dr. Gerlene Burdock has suggested. No answer but left a detailed message to pt's VM and told her to call this nurse back @ [phone]. Message to be fwd to H.Boleter,NP.

## 2015-04-11 DIAGNOSIS — D485 Neoplasm of uncertain behavior of skin: Secondary | ICD-10-CM | POA: Diagnosis not present

## 2015-04-11 DIAGNOSIS — L821 Other seborrheic keratosis: Secondary | ICD-10-CM | POA: Diagnosis not present

## 2015-04-11 DIAGNOSIS — Z85828 Personal history of other malignant neoplasm of skin: Secondary | ICD-10-CM | POA: Diagnosis not present

## 2015-04-11 DIAGNOSIS — L570 Actinic keratosis: Secondary | ICD-10-CM | POA: Diagnosis not present

## 2015-04-11 DIAGNOSIS — D0472 Carcinoma in situ of skin of left lower limb, including hip: Secondary | ICD-10-CM | POA: Diagnosis not present

## 2015-06-11 ENCOUNTER — Ambulatory Visit (HOSPITAL_BASED_OUTPATIENT_CLINIC_OR_DEPARTMENT_OTHER): Payer: Commercial Managed Care - HMO | Admitting: Oncology

## 2015-06-11 ENCOUNTER — Other Ambulatory Visit (HOSPITAL_BASED_OUTPATIENT_CLINIC_OR_DEPARTMENT_OTHER): Payer: Commercial Managed Care - HMO

## 2015-06-11 ENCOUNTER — Telehealth: Payer: Self-pay | Admitting: Oncology

## 2015-06-11 VITALS — BP 123/47 | HR 95 | Temp 98.1°F | Resp 18 | Ht 66.0 in | Wt 107.8 lb

## 2015-06-11 DIAGNOSIS — R634 Abnormal weight loss: Secondary | ICD-10-CM | POA: Diagnosis not present

## 2015-06-11 DIAGNOSIS — C50411 Malignant neoplasm of upper-outer quadrant of right female breast: Secondary | ICD-10-CM | POA: Diagnosis not present

## 2015-06-11 DIAGNOSIS — R911 Solitary pulmonary nodule: Secondary | ICD-10-CM

## 2015-06-11 DIAGNOSIS — J439 Emphysema, unspecified: Secondary | ICD-10-CM

## 2015-06-11 DIAGNOSIS — M810 Age-related osteoporosis without current pathological fracture: Secondary | ICD-10-CM | POA: Diagnosis not present

## 2015-06-11 DIAGNOSIS — Z720 Tobacco use: Secondary | ICD-10-CM

## 2015-06-11 LAB — CBC WITH DIFFERENTIAL/PLATELET
BASO%: 1.1 % (ref 0.0–2.0)
BASOS ABS: 0.1 10*3/uL (ref 0.0–0.1)
EOS%: 2 % (ref 0.0–7.0)
Eosinophils Absolute: 0.1 10*3/uL (ref 0.0–0.5)
HCT: 43.6 % (ref 34.8–46.6)
HGB: 14.3 g/dL (ref 11.6–15.9)
LYMPH%: 21.7 % (ref 14.0–49.7)
MCH: 32.5 pg (ref 25.1–34.0)
MCHC: 32.8 g/dL (ref 31.5–36.0)
MCV: 99.1 fL (ref 79.5–101.0)
MONO#: 0.6 10*3/uL (ref 0.1–0.9)
MONO%: 8.8 % (ref 0.0–14.0)
NEUT#: 4.4 10*3/uL (ref 1.5–6.5)
NEUT%: 66.4 % (ref 38.4–76.8)
Platelets: 202 10*3/uL (ref 145–400)
RBC: 4.4 10*6/uL (ref 3.70–5.45)
RDW: 13.2 % (ref 11.2–14.5)
WBC: 6.6 10*3/uL (ref 3.9–10.3)
lymph#: 1.4 10*3/uL (ref 0.9–3.3)

## 2015-06-11 LAB — COMPREHENSIVE METABOLIC PANEL
ALBUMIN: 3.5 g/dL (ref 3.5–5.0)
ALK PHOS: 73 U/L (ref 40–150)
ALT: 11 U/L (ref 0–55)
ANION GAP: 7 meq/L (ref 3–11)
AST: 17 U/L (ref 5–34)
BILIRUBIN TOTAL: 0.83 mg/dL (ref 0.20–1.20)
BUN: 18 mg/dL (ref 7.0–26.0)
CO2: 28 mEq/L (ref 22–29)
Calcium: 9.3 mg/dL (ref 8.4–10.4)
Chloride: 107 mEq/L (ref 98–109)
Creatinine: 0.9 mg/dL (ref 0.6–1.1)
EGFR: 57 mL/min/{1.73_m2} — AB (ref >90)
GLUCOSE: 114 mg/dL (ref 70–140)
Potassium: 4.1 mEq/L (ref 3.5–5.1)
SODIUM: 142 meq/L (ref 136–145)
Total Protein: 6.5 g/dL (ref 6.4–8.3)

## 2015-06-11 MED ORDER — ANASTROZOLE 1 MG PO TABS: 1.0000 mg | ORAL_TABLET | Freq: Every day | ORAL | Status: DC

## 2015-06-11 NOTE — Telephone Encounter (Signed)
Spoke with patient to confirm Oct appt date/time per 4/12 pof

## 2015-06-11 NOTE — Progress Notes (Signed)
[name]  [phone] [phone]     ID: Gabrielle [name] DOB: 06/12/1930  MR#: [id number]  TGG#:[id number]  Patient Care Team: Tammi Sou, MD as PCP - General (Family Medicine) Excell Seltzer, MD as Consulting Physician (General Surgery) Chauncey Cruel, MD as Consulting Physician (Oncology) Arloa Koh, MD as Consulting Physician (Radiation Oncology) Jarome Matin, MD as Consulting Physician (Dermatology) Brand Males, MD as Consulting Physician (Pulmonary Disease) OTHER MD: Joylene John, DDS  CHIEF COMPLAINT: Triple positive breast cancer  CURRENT TREATMENT:  Anastrozole  BREAST CANCER HISTORY: From the original intake note:  Gabrielle [name] had not been to a doctor for "more than 20 years. Since Christmas 2014 and the family has become increasingly concerned that she appeared to be losing weight and was a bit more confused. They asked the patient how much weight she had lost and she said she had lost "about 50 pounds.". However, the only weight we have available before August of this year is from August of 2014 and it was 115 pounds at that time.  Nevertheless with this concern the patient agreed to be seen by a physician and on 10/06/2013 she was seen by Dr. Kathlen Mody who obtained a full battery of tests. He found the patient to have a urinary tract infection with Escherichia coli, which may explain some of her confusion. Vitamin B 12 level was 215, which is in the low normal range. Hemoglobin was 14.7 with an MCV of 99.0. The patient was started on B12 supplementation parenterally and a chest x-ray was obtained 10/05/2013 which showed evidence of emphysema and prior granulomatous disease. To make sure an occult cancer was not being missed a CT scan of the chest abdomen and pelvis was obtained 11/19/2013. This showed a calcified granuloma in the inferior lingula. There was also a noncalcified 6 mm nodule in the lateral portion of the right lower  lobe. There was no mediastinal mass or adenopathy. There were calcified granulomata in the spleen as well. There was significant atherosclerotic calcifications. There was sigmoid diverticulosis noted incidentally. In addition, a 1.8 cm right breast mass was noted.  The breast mass was evaluated further with bilateral diagnostic mammography and right breast ultrasonography of the breast Center 11/26/2013. The breast density was category C. There was indeed an irregular mass in the right breast which was on palpation firm nontender and noted at the 10:30 position. Ultrasound confirmed a hypoechoic irregular mass measuring up to 3 cm. There was no right axillary adenopathy noted.  Biopsy of the right breast mass in question 11/26/2013 showed ([id number]) and invasive ductal carcinoma, grade 2, estrogen receptor 100% positive, progesterone receptor 59% positive, both with strong staining intensity, with an MIB-1 of 31%, and with HER-2 amplification, the signals ratio being 2.79 and the copy number per cell 5.45.  On 11-2013 the patient underwent bilateral breast MRI. This showed the enhancing mass in the upper-outer quadrant of the right breast to measure 2.1 cm. There was no other findings in the right breast, left breast, or either axilla.  The patient's subsequent history is as detailed below  INTERVAL HISTORY: Gabrielle [name] returns today for follow-up of her estrogen receptor positive breast cancer. She tolerates anastrozole with no side effects that she is aware of and in particular hot flashes, vaginal dryness, and arthralgias/myalgias are not prominent symptoms. She obtains it at a very good price.  REVIEW OF SYSTEMS: Gabrielle [name] continues to lose weight. Unfortunately she also continues to smoke. She is "not ready" to  quit. She has significant COPD and that may be the main reason for the weight loss. She had a squamous cell carcinoma removed from her left leg 04/11/2015 by Dr. Ronnald Ramp and he will be  following this on a regular basis. She complains of hearing loss, forgetfulness, and finds Aricept is not very helpful. As an example she says she received a notice from her insurance discussing payment or nonpayment of something but she couldn't figure out what it was all about an meant to bring it today but forgot. She is not exercising regularly. A detailed review of systems today was otherwise stable  PAST MEDICAL HISTORY: Past Medical History  Diagnosis Date  . Cataract   . Retina hole   . COPD (chronic obstructive pulmonary disease) ([place])   . Macular degeneration   . Anxiety   . Breast cancer of upper-outer quadrant of right female breast ([place])   . Complication of anesthesia     nausea and vomiting   . PONV (postoperative nausea and vomiting)   . Shortness of breath     increased activity   . Pneumonia     hx of     PAST SURGICAL HISTORY: Past Surgical History  Procedure Laterality Date  . Cataract extraction Bilateral   . Retinal laser procedure    . Abdominal hysterectomy  1980    (ovaries intact)  . Appendectomy    . Total mastectomy Right 12/18/2013    Procedure: RIGHT TOTAL MASTECTOMY;  Surgeon: Excell Seltzer, MD;  Location: [facility];  Service: General;  Laterality: Right;    FAMILY HISTORY Family History  Problem Relation Age of Onset  . Heart attack Mother     Deceased  . Cancer Mother     fallopian tube cancer in 26s; deceased 79  . Throat cancer Father     Deceased 56; smoker  . Prostate cancer Brother 9    Currently 47  . Other Brother     Deceased, hemorhhage of pancreas  . Leukemia Maternal Aunt   . Breast cancer Paternal Aunt     age at diagnosis unknown  . Throat cancer Paternal Uncle     Deceased 40s; smoker  . Leukemia Paternal Grandmother    the patient's father died at the age of 62 from throat cancer which has been diagnosed to years before. The patient's mother was diagnosed at age 33 with fallopian tube carcinoma. She died at age 38.  There is no other history of breast or ovarian cancer in the family to the patient's knowledge  GYNECOLOGIC HISTORY:  No LMP recorded. Patient has had a hysterectomy. Menarche age 45, first live birth age 68. The patient is GX P2. She underwent hysterectomy at a relatively young age, without salpingo-oophorectomy. She did not take hormone replacement however she took birth control pills remotely for approximately 2 years with no complications.  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 She is divorced. Her son Gabrielle [name] lives in [place] and works as a Metallurgist. Daughter Gabrielle [name] lives in [place] where she works as a Architect for the [employer]. Incidentally Dr. Kathe Becton is a nephew of the patient    ADVANCED DIRECTIVES: In place; the patient's son Gabrielle [name] is her healthcare power of attorney. He can be reached at [phone]: Social History  Substance Use Topics  . Smoking status: Current Every Day Smoker -- 0.50 packs/day for 40  years    Types: Cigarettes  . Smokeless tobacco: Never Used  . Alcohol Use: 1.2 oz/week    2 Glasses of wine per week     Comment: wine a couple times weekly     Colonoscopy: Never  PAP:  Bone density: September 2016: Osteoporosis  Lipid panel:  No Known Allergies  Current Outpatient Prescriptions  Medication Sig Dispense Refill  . anastrozole (ARIMIDEX) 1 MG tablet Take 1 tablet (1 mg total) by mouth daily. 90 tablet 3  . Cholecalciferol ([employer] D3) 5000 UNITS capsule Take 5,000 Units by mouth every other day.     . Cyanocobalamin (VITAMIN B-12 PO) Take 1 tablet by mouth daily.     Marland Kitchen donepezil (ARICEPT) 5 MG tablet Take 1 tablet daily for one month, then increase to 2 tablets daily. (Patient taking differently: Take 5 mg by mouth. Pt takes 2 tablets daily.) 60 tablet 5  . fluticasone (FLONASE) 50 MCG/ACT nasal spray Place into both nostrils  daily as needed.     . Tiotropium Bromide Monohydrate (SPIRIVA RESPIMAT) 2.5 MCG/ACT AERS Inhale 2 puffs into the lungs as needed. @@ 11 am    . vitamin A 10000 UNIT capsule Take 10,000 Units by mouth every morning.      No current facility-administered medications for this visit.    OBJECTIVE: Elderly white woman Who appears stated age 16 Vitals:   06/11/15 1223  BP: 123/47  Pulse: 95  Temp: 98.1 F (36.7 C)  Resp: 18     Body mass index is 17.41 kg/(m^2).    ECOG FS:1 - Symptomatic but completely ambulatory  Sclerae unicteric, EOMs intact Oropharynx clear, no thrush or other lesions No cervical or supraclavicular adenopathy Lungs no rales or rhonchi, no wheezes Heart regular rate and rhythm Abd soft, scaphoid, nontender, positive bowel sounds MSK kyphosis but no focal spinal tenderness, no upper extremity lymphedema Neuro: nonfocal, well oriented, appropriate affect Breasts: The right breast is status post mastectomy. There is no evidence of local recurrence. The right axilla is benign. The left breast is unremarkable   LAB RESULTS:  CMP     Component Value Date/Time   NA 142 06/11/2015 1212   NA 136* 12/19/2013 0425   K 4.1 06/11/2015 1212   K 4.7 12/19/2013 0425   CL 103 12/19/2013 0425   CO2 28 06/11/2015 1212   CO2 24 12/19/2013 0425   GLUCOSE 114 06/11/2015 1212   GLUCOSE 90 12/19/2013 0425   BUN 18.0 06/11/2015 1212   BUN 17 12/19/2013 0425   CREATININE 0.9 06/11/2015 1212   CREATININE 0.71 12/19/2013 0425   CALCIUM 9.3 06/11/2015 1212   CALCIUM 8.1* 12/19/2013 0425   PROT 6.5 06/11/2015 1212   PROT 6.1 10/05/2013 1158   ALBUMIN 3.5 06/11/2015 1212   ALBUMIN 3.4* 10/05/2013 1158   AST 17 06/11/2015 1212   AST 23 10/05/2013 1158   ALT 11 06/11/2015 1212   ALT 17 10/05/2013 1158   ALKPHOS 73 06/11/2015 1212   ALKPHOS 66 10/05/2013 1158   BILITOT 0.83 06/11/2015 1212   BILITOT 0.8 10/05/2013 1158   GFRNONAA 79* 12/19/2013 0425   GFRAA >90  12/19/2013 0425    I No results found for: SPEP  Lab Results  Component Value Date   WBC 6.6 06/11/2015   NEUTROABS 4.4 06/11/2015   HGB 14.3 06/11/2015   HCT 43.6 06/11/2015   MCV 99.1 06/11/2015   PLT 202 06/11/2015      Chemistry      Component  Value Date/Time   NA 142 06/11/2015 1212   NA 136* 12/19/2013 0425   K 4.1 06/11/2015 1212   K 4.7 12/19/2013 0425   CL 103 12/19/2013 0425   CO2 28 06/11/2015 1212   CO2 24 12/19/2013 0425   BUN 18.0 06/11/2015 1212   BUN 17 12/19/2013 0425   CREATININE 0.9 06/11/2015 1212   CREATININE 0.71 12/19/2013 0425      Component Value Date/Time   CALCIUM 9.3 06/11/2015 1212   CALCIUM 8.1* 12/19/2013 0425   ALKPHOS 73 06/11/2015 1212   ALKPHOS 66 10/05/2013 1158   AST 17 06/11/2015 1212   AST 23 10/05/2013 1158   ALT 11 06/11/2015 1212   ALT 17 10/05/2013 1158   BILITOT 0.83 06/11/2015 1212   BILITOT 0.8 10/05/2013 1158       No results found for: LABCA2  No components found for: LABCA125  No results for input(s): INR in the last 168 hours.  Urinalysis    Component Value Date/Time   COLORURINE YELLOW 10/05/2013 1158   APPEARANCEUR Cloudy* 10/05/2013 1158   LABSPEC 1.010 10/05/2013 1158   PHURINE 8.5* 10/05/2013 1158   GLUCOSEU NEGATIVE 10/05/2013 1158   HGBUR NEGATIVE 10/05/2013 1158   BILIRUBINUR neg 01/22/2014 1203   BILIRUBINUR NEGATIVE 10/05/2013 1158   KETONESUR NEGATIVE 10/05/2013 1158   PROTEINUR neg 01/22/2014 1203   UROBILINOGEN 0.2 01/22/2014 1203   UROBILINOGEN 0.2 10/05/2013 1158   NITRITE positive 01/22/2014 1203   NITRITE NEGATIVE 10/05/2013 1158   LEUKOCYTESUR small (1+) 01/22/2014 1203    STUDIES: Repeat mammography due September 2017  ASSESSMENT: 82 y.o. BRCA negative [place] woman status post right breast upper outer quadrant biopsy 11/26/2013 for a clinical T2 N0, stage IIA invasive ductal carcinoma, grade 2, with micropapillary features, triple positive, with an MIB-1 of 31%  (1)  right simple mastectomy 12/18/2013 showed a pT2 pNX, stage II invasive ductal carcinoma, grade 2, with negative margins.  (2) 6 mm right lower lobe nodule noted on CT scans 11/19/2013  (a) continuing tobacco abuse-- transitioning to e-cigarettes?  (b) emphysema  (c)  Repeat chest CT 06/27/2014 stable  (3) progressive weight loss   (4) started anastrozole 01/03/2014  (5) started trastuzumab 02/05/2014; completed one year, last dose 02/27/2016  (a) echo  02/27/2015 shows an EF of 60-65%   (6) tobacco abuse disorder: the patient has been strongly advised to discontinue smoking  (7) osteoporosis- consider zolendronate after dental work completed  PLAN: I discussed Nastashia's osteoporosis treatment options today with her dentist, Dr. Gerlene Burdock. While no extractions are immediately planned, there is some gum retraction anteriorly in the mandible which may lead to extractions sometime in the next 6-12 months. Given that I think it would be prudent to simply wait until that whole issue is taking care of instead of starting [facility] on zolendronate or denosumab and risk her developing osteonecrosis problems.  She appears weaker and a little bit slower mentally. She expresses concerns regarding Parkinson's but I see no symptoms or signs suggestive of that. As far as her weight loss is concerned I think she has progressive COPD and that may account for it. I suggested to her that if she was able to quit smoking she would probably pick up 10-15 pounds over the next year.  I'm going to see her again in October after her next mammogram. From that point we will start seeing her on a once a year basis.  She knows to call for any problems that may develop before  then. Chauncey Cruel, MD   06/11/2015 1:08 PM

## 2015-08-01 ENCOUNTER — Ambulatory Visit (INDEPENDENT_AMBULATORY_CARE_PROVIDER_SITE_OTHER): Payer: Commercial Managed Care - HMO | Admitting: Family Medicine

## 2015-08-01 ENCOUNTER — Encounter: Payer: Self-pay | Admitting: Family Medicine

## 2015-08-01 VITALS — BP 131/84 | HR 75 | Temp 97.9°F | Resp 20 | Ht 66.0 in | Wt 109.0 lb

## 2015-08-01 DIAGNOSIS — R64 Cachexia: Secondary | ICD-10-CM

## 2015-08-01 DIAGNOSIS — F0150 Vascular dementia without behavioral disturbance: Secondary | ICD-10-CM | POA: Insufficient documentation

## 2015-08-01 MED ORDER — DONEPEZIL HCL 5 MG PO TABS: ORAL_TABLET | ORAL | Status: DC

## 2015-08-01 NOTE — Progress Notes (Signed)
Patient ID: Gabrielle [name], female   DOB: [birth year], 82 y.o.   MRN: [id number]    Gabrielle [name] , January 02, 1931, 82 y.o., female MRN: [id number]  CC: vascular dementia Patient Care Team    Relationship Specialty Notifications Start End  Ma Hillock, DO PCP - General Family Medicine  08/01/15   Excell Seltzer, MD Consulting Physician General Surgery  11/29/13   Chauncey Cruel, MD Consulting Physician Oncology  11/29/13   Arloa Koh, MD Consulting Physician Radiation Oncology  11/29/13   Jarome Matin, MD Consulting Physician Dermatology  06/11/15   Brand Males, MD Consulting Physician Pulmonary Disease  06/11/15   Alda Berthold, DO Consulting Physician Neurology  08/01/15     Subjective: Vascular dementia: Patient presents with her son [name]) today to discuss her vascular dementia. Patient's son and daughter have concerns about her mother's safety, and progression in her dementia. This is the first time I have met the patient. Patient states she is following with her oncologist for her breast cancer history. She is due to follow back up with him after her mammogram in October, and then a yearly basis. He would like to start Reclast for her osteoporosis but favor waiting until after she has her dental work completed. Patient seemed to be confused on when her follow-up should be and at the start of the medication for her osteoporosis. She currently continues to take anastrozole. Patient reports she has not followed up with neurologist as planned because she was fearful she was going to have her license taken away and she did not like the they had recommended she stop working. She was also seen by neuropsychologist which to the imaging study felt that she had vascular dementia and has suffered from 2 prior strokes. Family made concerning bringing her in today is to attempt to discuss with her and encourage her to be agreeable to see the neurologist again. Patient states that she is no longer taking  the Aricept because she didn't see any difference anyway. Patient does admit to being more forgetful, but does not see this as a issue. She feels that she is normal forgetfulness secondary to her age. Her son reports that the house is "filling up with mail ". He states that his mother has a hard time throwing away mail until it is open, however she is not opening female in a timely manner. Bills are becoming late and the house is becoming cluttered with an open mail. He states that there is mail it has been there for at least a year. He feels it's even potentially a fire hazard. The patient states that she does not want throughout the mail because be something important there. She gets anxious when the idea of cleaning out the home and mail is mentioned. Patient's family states that she gets mail and other family members house because she signed up for symmetry different mailing list. Her son also reports that there are some financial concerns. He feels that she has to open with her and antral portfolio with people that she should not be sharing that information with. He also states that she gives lots of her money to charity, and finds that the charity are nonexistent/false charities. Another example is she recently went on a road trip with her brother to [place], of which she is not able to tell any major details. Patient spends theses locked out of the majority of her online accounts because she changes the passwords and then  forgets them. She states she wrote them down but lost the note book. She denies any falls in the home, but had recently tripped over pipe in the yard at [employer]. She reports she does cook for herself, and her son doesn't place any safety concerns surrounding her well-appearing her own meals. She lives alone and unassisted, and does not have routine family check ins. Her son is fearful she is going to get in the car and either get in an accident, or get lost and nobody  will know for a few days.  She is losing weight, however her weight has been stable over the last year. Her BMI is 17.6. Her son states that she usually just eats one item as a meal, i.e. mashed potatoes. She does not take any nutrition supplements.   No Known Allergies Social History  Substance Use Topics  . Smoking status: Current Every Day Smoker -- 0.50 packs/day for 40 years    Types: Cigarettes  . Smokeless tobacco: Never Used  . Alcohol Use: 1.2 oz/week    2 Glasses of wine per week     Comment: wine a couple times weekly   Past Medical History  Diagnosis Date  . Cataract   . Retina hole   . COPD (chronic obstructive pulmonary disease) ([place])   . Macular degeneration   . Anxiety   . Breast cancer of upper-outer quadrant of right female breast ([place])   . Complication of anesthesia     nausea and vomiting   . PONV (postoperative nausea and vomiting)   . Shortness of breath     increased activity   . Pneumonia     hx of    Past Surgical History  Procedure Laterality Date  . Cataract extraction Bilateral   . Retinal laser procedure    . Abdominal hysterectomy  1980    (ovaries intact)  . Appendectomy    . Total mastectomy Right 12/18/2013    Procedure: RIGHT TOTAL MASTECTOMY;  Surgeon: Excell Seltzer, MD;  Location: [facility];  Service: General;  Laterality: Right;   Family History  Problem Relation Age of Onset  . Heart attack Mother     Deceased  . Cancer Mother     fallopian tube cancer in 56s; deceased 31  . Throat cancer Father     Deceased 21; smoker  . Prostate cancer Brother 8    Currently 32  . Other Brother     Deceased, hemorhhage of pancreas  . Leukemia Maternal Aunt   . Breast cancer Paternal Aunt     age at diagnosis unknown  . Throat cancer Paternal Uncle     Deceased 77s; smoker  . Leukemia Paternal Grandmother      Medication List       This list is accurate as of: 08/01/15  1:46 PM.  Always use your most recent med list.                anastrozole 1 MG tablet  Commonly known as:  ARIMIDEX  Take 1 tablet (1 mg total) by mouth daily.     CVS D3 5000 units capsule  Generic drug:  Cholecalciferol  Take 5,000 Units by mouth every other day.     donepezil 5 MG tablet  Commonly known as:  ARICEPT  Take 1 tablet daily for one month, then increase to 2 tablets daily.     fluticasone 50 MCG/ACT nasal spray  Commonly known as:  FLONASE  Place into both  nostrils daily as needed.     SPIRIVA RESPIMAT 2.5 MCG/ACT Aers  Generic drug:  Tiotropium Bromide Monohydrate  Inhale 2 puffs into the lungs as needed. @@ 11 am     vitamin A 10000 UNIT capsule  Take 10,000 Units by mouth every morning.     VITAMIN B-12 PO  Take 1 tablet by mouth daily.       ROS: Negative, with the exception of above mentioned in HPI  Objective:  BP 131/84 mmHg  Pulse 75  Temp(Src) 97.9 F (36.6 C)  Resp 20  Ht _0  (1.676 m)  Wt 109 lb (49.442 kg)  BMI 17.60 kg/m2  SpO2 96% Body mass index is 17.6 kg/(m^2). Gen: Afebrile. No acute distress. Nontoxic in appearance, thin, Caucasian female.  HENT: AT. [place]. Tacky mucous membranes. Eyes:Pupils Equal Round Reactive to light, Extraocular movements intact,  Conjunctiva without redness, discharge or icterus. CV: RRR  Chest: CTAB, no wheeze or crackles. Good air movement, normal resp effort.  Abd: Soft. NTND. BS present. Neuro: anterior head carriage/gait. PERLA. EOMi. Alert. Oriented x3  Psych: Normal affect, dress and demeanor. Normal speech. Normal thought content and judgment.   Assessment/Plan: TIMBERLY YOTT is a 82 y.o. female present for  OV for  Vascular dementia, without behavioral disturbance - Family has concerns that her dementia is worsening. Patient has concerns for independence will be taken away. She has been resistant on going back to neurology because they told her to stop driving and stopped working. Long discussion with patient and her son today, encouraging patient to  return to neurology and restart her Aricept. Patient seemed to be amendable to suggestion when she was explained that even if the Aricept cannot return some of her memory loss, a slow the progression of her dementia. - I do have some safety concerns of her in the home with dementia and reports of what sounds like hoarding. Considering the first time I have met the patient, it is difficult to assess if she is progressing with her dementia or not. Patient is agreeable to return to see Dr. Posey Pronto. - This appears to be a safety versus autonomy issue. She is also amendable for social work/safety evaluation of the home. I will place this home health referral today. - Patient is amendable to restart Aricept,   Refills provided and taper dose.  Cachexia ([place]) - Encouraged patient's son and her to at least try to had one ensure drink daily, as well as attempting at least 3 small meals daily. - Discussed weight loss been secondary to dementia as a possibility. - Senior resources discussed with patient and son.    Greater than 40 minutes was spent with patient, greater than 50% of that time was spent face-to-face with patient counseling and coordinating care.  electronically signed by:  Howard Pouch, DO  [place]

## 2015-08-01 NOTE — Patient Instructions (Signed)
Please call Dr. Posey Pronto office ot schedule immediately.  I have refilled your Aricept please restart. Again this is to help with memory and slow progression of memory loss.  I will also place  A social worker to come talk to you in your home and make recommendations.

## 2015-08-05 ENCOUNTER — Telehealth: Payer: Self-pay | Admitting: Family Medicine

## 2015-08-05 NOTE — Telephone Encounter (Signed)
Please call pt son (POA- [name]): - Explain that the [facility] person went to eval his mother by stopping by the house, since they could not get ahold of them via phone. Ms. Grosz told him she did not need evaluated and that she was actually going to go "weed eat the lawn".  - can we get him the contact info, if he desires to try again. You can get off Diane.

## 2015-08-06 NOTE — Telephone Encounter (Signed)
Spoke with patient son he will contact [facility] to set up an appt.

## 2015-09-18 ENCOUNTER — Telehealth: Payer: Self-pay | Admitting: *Deleted

## 2015-09-18 ENCOUNTER — Telehealth: Payer: Self-pay | Admitting: Neurology

## 2015-09-18 NOTE — Telephone Encounter (Signed)
Please add her to my schedule on 8/8 at 11:30pm.

## 2015-09-18 NOTE — Telephone Encounter (Signed)
Please advise 

## 2015-09-18 NOTE — Telephone Encounter (Signed)
Gabrielle [name] 03/05/1930. Her daughter called regarding needing to get her mother in sooner than October. I explained that was the first opening she had and I put her on a wait list as well. Her daughter would like you to call her at [phone]. She said she believes her mother has had more strokes and declining. Thank you

## 2015-09-18 NOTE — Telephone Encounter (Signed)
Patient daughter called regarding referral for social worker. Advised daughter that referral is in place and we would send a message to [facility] to call her to set up ASAP. Daughter states they are concerned because her mother's Dementia is getting worse. She states her mother is getting credit cards and giving information to people over the phone and getting charges that are fraudulent . She wanted [place] to try and get Neuro to see her mother sooner than currently scheduled appt . Note in chart indicates daughter had already left a message with Neurology,explained to daughter that she needs to wait for their response. Advised daughter to expect call from [facility] to set up Social work consult and Runner, broadcasting/film/video.

## 2015-09-25 ENCOUNTER — Encounter: Payer: Self-pay | Admitting: Neurology

## 2015-09-25 ENCOUNTER — Ambulatory Visit (INDEPENDENT_AMBULATORY_CARE_PROVIDER_SITE_OTHER): Payer: Commercial Managed Care - HMO | Admitting: Neurology

## 2015-09-25 VITALS — BP 100/64 | HR 92 | Ht 66.0 in | Wt 110.0 lb

## 2015-09-25 DIAGNOSIS — F01518 Vascular dementia, unspecified severity, with other behavioral disturbance: Secondary | ICD-10-CM

## 2015-09-25 DIAGNOSIS — F0151 Vascular dementia with behavioral disturbance: Secondary | ICD-10-CM | POA: Diagnosis not present

## 2015-09-25 NOTE — Progress Notes (Signed)
Follow-up Visit   Date: 09/25/15    Gabrielle [name] MRN: [id number] DOB: 1930-07-29   Interim History: Gabrielle [name] is a 82 y.o. right-handed Caucasian female with history of tobacco use returning to the clinic for follow-up of vascular dementia.  The patient was accompanied to the clinic by son and daughter-in-law who also provides collateral information.    History of present illness: Since January 2015, her son noticed changes in her mother's cognitive ability. She has been having problems with short-term memory and not has "sharp" as she used to be. She is highly functioning at baseline and works part-time at [employer] and has not noticed any problems at work. Problems are mostly with short-term memory such as remembering names, dates, appointments and she has started to write things down more. Her son and daughter have noticed the changes more than patient. Hobbies include reading, working, and lately she has been organizing her ex-husbands home since he passed away last year. They had been divorced for 20+ years.   She is driving without any difficulty. She denies getting lost or being involved in any car accidents. She cooks for herself and eats 2-3 meals per day. Her son says that her response is slower, such as when talking in conversation. Her mood is good. Sleep is fair, averaging 6-7 hours per night. No bizarre or inappropriate behavior changes.   She has not had regular medical care since 2000.  Of note, she has lost 50lb unintentionally in the past 57-months. Appetite remains good.   UPDATE 12/13/2013:  She was found to have right breast cancer and is scheduled to have mastectomy.  She is having a harder time learning new tasks and continues to have problems with short-term memory.  She has difficulty maintaining her mail, e-mails, lost her checkbook in her mail, and daughter says her home is in disarray.  She usually eats about two meals a day, usually gets takeout or eats  frozen meals.  Family does not have any concerns with home safety or with her driving. She has not been involved in any motor vehicle accidents. On a rare occasion, she does admit to getting lost.  UPDATE 04/24/2014:  She is accompanied by her son, Gabrielle [name].  She underwent right mastectomy and is now undergoing chemotherapy. Since her last visit, her daughter contacted our office with concerns that patient was ordering $1200 of online magazine which is atypical for her.  Neuropsychology testing showed evidence of vascular dementia (mild).  Despite recommendation to stop working by her son and daughter, patient continue to file taxes and works for [employer] as well as drive locally.  UPDATE 07/25/2014:  She called her son in a panic that she lost her wallet, but then found it on the floor.  Her [employer] cable was late in getting paid and her service was cut.  She is behind on her medical bills.  Her son, Gabrielle [name], is her POA. They are interested in seeking driving assessment.  She is locked out of her facebook and brokerage accounts and accepting calls from people that she is not familiar with.   She stopped working on April 18th, but against medical recommendation, she did continue to file taxes for clients.    UPDATE 09/25/2015:  She is here with her son and daughter-in-law.  She was last seen in May 2016.  Reviewing the notes from her PCP indicate that patient did not follow-up here due to my recommendations that she stop working and  driving based on her cognitive impairment.  Her son states that her home situation has become worse over the past year, especially with respect to her finances.  She has three active fraud investigations regarding her checking account, [employer] visa card, and [employer] checking account.  Her computer has been hacked and has multiple viruses because she opens emails from people that she does not know.  They are concerned that she is also taking phone calls and agreeing to certain  transactions, unknowingly by scam artists.    She continues to live alone and family has noticed that she is hoarding mail and not able to keep up with cleanliness of her home.  She feels the need to open every article of mail that is delivered and has signed up for numerous newsletters and promotions.  Despite my recommendations, she continued to work this year and drive. Family is concerned about whether she takes her medications and eats three meals a day.  They are also worried about her fall risk because she has so much mail all over her home, including the floor. She does not allow her children to help discard unwanted mail or clean up.   Her son has tried very hard to take control of her finances and confiscated her computer.  However, she still has her phone and ipad.  Patient does not seem to be acknowledge that there is any problem.  Mood is good.  Medications:  Current Outpatient Prescriptions on File Prior to Visit  Medication Sig Dispense Refill  . anastrozole (ARIMIDEX) 1 MG tablet Take 1 tablet (1 mg total) by mouth daily. 90 tablet 3  . Cholecalciferol (CVS D3) 5000 UNITS capsule Take 5,000 Units by mouth every other day.     . Cyanocobalamin (VITAMIN B-12 PO) Take 1 tablet by mouth daily.     Marland Kitchen donepezil (ARICEPT) 5 MG tablet Take 1 tablet daily for one month, then increase to 2 tablets daily. 60 tablet 5  . fluticasone (FLONASE) 50 MCG/ACT nasal spray Place into both nostrils daily as needed.     . Tiotropium Bromide Monohydrate (SPIRIVA RESPIMAT) 2.5 MCG/ACT AERS Inhale 2 puffs into the lungs as needed. @@ 11 am    . vitamin A 10000 UNIT capsule Take 10,000 Units by mouth every morning.      No current facility-administered medications on file prior to visit.     Allergies: No Known Allergies   Review of Systems:  CONSTITUTIONAL: No fevers, chills, night sweats, +weight loss.   EYES: No visual changes or eye pain ENT: No hearing changes.  No history of nose bleeds.     RESPIRATORY: No cough, wheezing and shortness of breath.   CARDIOVASCULAR: Negative for chest pain, and palpitations.   GI: Negative for abdominal discomfort, blood in stools or black stools.  No recent change in bowel habits.   GU:  No history of incontinence.   MUSCLOSKELETAL: No history of joint pain or swelling.  No myalgias.   SKIN: Negative for lesions, rash, and itching.   ENDOCRINE: Negative for cold or heat intolerance, polydipsia or goiter.   PSYCH:  No  depression or anxiety symptoms.   NEURO: As Above.   Vital Signs:  BP 100/64   Pulse 92   Ht 5\' 6"  (1.676 m)   Wt 110 lb (49.9 kg)   SpO2 95%   BMI 17.75 kg/m    Neurological Exam:  MENTAL STATUS:   [facility] Cognitive Assessment  09/25/2015 07/25/2014 12/13/2013 10/08/2013  Visuospatial/ Executive (  0/5) 4 3 4 2   Naming (0/3) 3 2 3 2   Attention: Read list of digits (0/2) 1 2 2 2   Attention: Read list of letters (0/1) 1 1 1 1   Attention: Serial 7 subtraction starting at 100 (0/3) 3 3 3 1   Language: Repeat phrase (0/2) 2 2 2 2   Language : Fluency (0/1) 0 0 0 0  Abstraction (0/2) 2 2 2 2   Delayed Recall (0/5) 4 3 2 3   Orientation (0/6) 6 6 5 5   Total 26 24 24 20   Adjusted Score (based on education) 26 24 25 20    Awake, flat affect, oriented to person, place, date, year.  Poor spontaneous speech, answers only when asked direct questions.  Poor eye contact.  Poor insight.  Impaired judgement.   CRANIAL NERVES: Pupils equal round and reactive to light.   Face is symmetric.   MOTOR:  Motor strength is 5-/5 in all extremities. Tone is normal.    COORDINATION/GAIT:    Stooped posture, gait narrow based and stable.  Tandem gait intact.   Data: Lab Results  Component Value Date   [id number] (H) 12/13/2013   Lab Results  Component Value Date   TSH 1.70 10/05/2013   MRI brain 01/22/2014: No acute infarct. Remote medial right thalamic infarct which was partially hemorrhagic with presence of blood breakdown  products. Remote tiny inferior cerebellar infarct. Mild to moderate small vessel disease type changes.  No intracranial mass or bony destructive lesion to suggest the presence of intracranial metastatic disease.  Global atrophy without hydrocephalus.  Right vertebral artery and right posterior inferior cerebellar artery may be occluded.  Ne[facility]psychiatry testing at [facility] 2016:  Neuropsychology testing showed evidence of vascular dementia (mild)  IMPRESSION: Ms. Cadd is 82 year old female with returning with vascular dementia with behavior changes.  She seems to have obsessive compulsive behaviors regarding her mail.  Safely handling her finances has become a greater issue and her son is now taking over this responsibility.  I recommend that he should also consider changing her cell phone number as to avoid further scams (she reports getting 49 calls in one week from various people).  Patient does not have the insight to understand the financial and safety ramifications of her actions and therefore, I urged the family to keep up with overseeing her finances.    Again, I have asked her to stop working and limit driving. She is interested in having a formal driving assessment, which would help determine her road safety.  She is living alone and family is expressing concerns about home safety especially with respect to her managing her medications and fall risk.  Her PCP has already sent a referral for home health, which I absolutely agree with.    PLAN/RECOMMENDATIONS:  1.  Continue Aricept 10mg  daily  2.  Continue aspirin 81mg  daily 3.  Recommend no driving, information on functional driving assessment provided 4.  Senior care resources information was provided 5.  Agree with home health care for medication management  6.  Neuropsychological testing to look for interval change as there is now behavior changes which was previously not present  Return to clinic in 6  months   The duration of this appointment visit was 40 minutes of face-to-face time with the patient.  Greater than 50% of this time was spent in counseling, explanation of diagnosis, planning of further management, and coordination of care.   Thank you for allowing me to participate in patient's care.  If  I can answer any additional questions, I would be pleased to do so.    Sincerely,    Donika K. Posey Pronto, DO

## 2015-09-25 NOTE — Patient Instructions (Addendum)
[email or web address].  "pathways & protocols"  Start aspirin 81mg  daily  Referral to Driver Rehab  Neuropsychological testing  Return to clinic as needed

## 2015-09-29 ENCOUNTER — Other Ambulatory Visit: Payer: Self-pay | Admitting: *Deleted

## 2015-09-29 DIAGNOSIS — J449 Chronic obstructive pulmonary disease, unspecified: Secondary | ICD-10-CM | POA: Diagnosis not present

## 2015-09-29 DIAGNOSIS — M810 Age-related osteoporosis without current pathological fracture: Secondary | ICD-10-CM | POA: Diagnosis not present

## 2015-09-29 DIAGNOSIS — F0150 Vascular dementia without behavioral disturbance: Secondary | ICD-10-CM | POA: Diagnosis not present

## 2015-09-29 DIAGNOSIS — Z720 Tobacco use: Secondary | ICD-10-CM | POA: Diagnosis not present

## 2015-09-29 DIAGNOSIS — Z9011 Acquired absence of right breast and nipple: Secondary | ICD-10-CM | POA: Diagnosis not present

## 2015-09-29 DIAGNOSIS — F419 Anxiety disorder, unspecified: Secondary | ICD-10-CM | POA: Diagnosis not present

## 2015-09-29 DIAGNOSIS — C50411 Malignant neoplasm of upper-outer quadrant of right female breast: Secondary | ICD-10-CM | POA: Diagnosis not present

## 2015-09-29 MED ORDER — DONEPEZIL HCL 5 MG PO TABS: ORAL_TABLET | ORAL | 5 refills | Status: DC

## 2015-10-01 DIAGNOSIS — J449 Chronic obstructive pulmonary disease, unspecified: Secondary | ICD-10-CM | POA: Diagnosis not present

## 2015-10-01 DIAGNOSIS — F0150 Vascular dementia without behavioral disturbance: Secondary | ICD-10-CM | POA: Diagnosis not present

## 2015-10-01 DIAGNOSIS — F419 Anxiety disorder, unspecified: Secondary | ICD-10-CM | POA: Diagnosis not present

## 2015-10-01 DIAGNOSIS — M810 Age-related osteoporosis without current pathological fracture: Secondary | ICD-10-CM | POA: Diagnosis not present

## 2015-10-01 DIAGNOSIS — Z720 Tobacco use: Secondary | ICD-10-CM | POA: Diagnosis not present

## 2015-10-01 DIAGNOSIS — C50411 Malignant neoplasm of upper-outer quadrant of right female breast: Secondary | ICD-10-CM | POA: Diagnosis not present

## 2015-10-01 DIAGNOSIS — Z9011 Acquired absence of right breast and nipple: Secondary | ICD-10-CM | POA: Diagnosis not present

## 2015-10-02 ENCOUNTER — Ambulatory Visit (INDEPENDENT_AMBULATORY_CARE_PROVIDER_SITE_OTHER): Payer: Commercial Managed Care - HMO | Admitting: Psychology

## 2015-10-02 ENCOUNTER — Encounter: Payer: Self-pay | Admitting: Psychology

## 2015-10-02 DIAGNOSIS — F0151 Vascular dementia with behavioral disturbance: Secondary | ICD-10-CM | POA: Diagnosis not present

## 2015-10-02 DIAGNOSIS — Z720 Tobacco use: Secondary | ICD-10-CM | POA: Diagnosis not present

## 2015-10-02 DIAGNOSIS — J449 Chronic obstructive pulmonary disease, unspecified: Secondary | ICD-10-CM | POA: Diagnosis not present

## 2015-10-02 DIAGNOSIS — C50411 Malignant neoplasm of upper-outer quadrant of right female breast: Secondary | ICD-10-CM | POA: Diagnosis not present

## 2015-10-02 DIAGNOSIS — F0150 Vascular dementia without behavioral disturbance: Secondary | ICD-10-CM | POA: Diagnosis not present

## 2015-10-02 DIAGNOSIS — F419 Anxiety disorder, unspecified: Secondary | ICD-10-CM | POA: Diagnosis not present

## 2015-10-02 DIAGNOSIS — Z9011 Acquired absence of right breast and nipple: Secondary | ICD-10-CM | POA: Diagnosis not present

## 2015-10-02 DIAGNOSIS — F01518 Vascular dementia, unspecified severity, with other behavioral disturbance: Secondary | ICD-10-CM

## 2015-10-02 DIAGNOSIS — M810 Age-related osteoporosis without current pathological fracture: Secondary | ICD-10-CM | POA: Diagnosis not present

## 2015-10-02 NOTE — Progress Notes (Signed)
NEUROPSYCHOLOGICAL INTERVIEW (CPT: [id number])  Name: Gabrielle [name] Date of Birth: 23-Apr-1930 Date of Interview: 10/02/2015  Reason for Referral:  Gabrielle [name] is a 82 y.o., divorced female who is referred for neuropsychological re-evaluation by Dr. Narda [name] of [facility] due to concerns about new behavioral changes in the context of previously diagnosed vascular dementia. This patient is accompanied in the office by her son, Gabrielle [name], who supplements the history.   History of Presenting Problem:  This patient was previously evaluated by Dr. Malon Kindle [name] of [facility] in January 2016 (full report is scanned into Epic). Please refer to her report for more detailed history. Briefly, at that time, the patient's son reported abrupt onset of cognitive and functional difficulties in March 2015 with a subsequent progressive decline in judgment and thinking. Around March 2015, the patient reportedly had difficulty maintaining the cleanliness of her home, paper female (piling up within her home, and she stopped going through and responding to all of her knee female. The patient was also making several uncharacteristic purchases with no subsequent knowledge of having made the purchase. It was noted that the patient had not been followed by primary care physician for 15 years or more and had recently begun taking medication. Dr. Guido [name] evaluation yielded a diagnosis of vascular dementia with behavioral disturbance. Specific declines were noted in math skills, visual attention, aspects of executive functioning, and aspects of non-contextual verbal memory.   Since her last evaluation in January 2016, the patient does endorse short-term memory decline and notes that her son is doing more for her because of this. Her son reported significant ongoing problems with judgment and decision making. There are currently 3 fraud investigations underway for different bank/credit accounts of the patient's. Her son  had to take away her computer because it was so fraught with viruses and malware that she could no longer use it. She continues to report paper female. Her son showed me photographs of piles of May all throughout her home. She refuses to let her family help her go through the mail. She reported that she is concerned that they would throw things away that should not be thrown away. Her son is concerned that her home is becoming dangerous to live in. Additionally she is losing important female items among all the papers. The patient's son also reported that she has been more forgetful; for example, she frequently forgets her purse. She also got lost driving to a familiar location just recently. Of note, her son reported that she does not seem to be concerned by the scams and fraudulent activity in her accounts.  Upon direct questioning, the patient's son also endorses forgetfulness for recent conversations and reduced auditory comprehension.  Psychiatric history was denied.    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 Her son would like her to stop doing this work since he feels her cognitive difficulties make her more prone to making errors. She would like to continue doing this work for 1 more year. The patient continues to live alone. A home healthcare service has recently started coming to her home. Her son noted that the family is unsure if she is safe to live independently. The patient continues to drive. Her son reported that he is not concerned about her ability to drive locally but does not think she should be driving distances. As noted previously, the patient did become lost recently when  driving to [place]. The patient does minimal cooking without any reported difficulty. Her son has taken over management of her medications and finances. Her daughter is assisting with appointment tracking.  The patient  denied any significant difficulty with balance or walking. She did stumble and fall somewhat recently while at her [place] home. She denied injury or loss of consciousness.  The patient and her son reported significantly reduced hearing. She has not been to an audiologist.  Physically, the patient complained of low energy. She denied significant sleep disturbance. She reportedly has finished treatment for breast cancer.  With regard to mood, the patient reported that she is "about the same". However, she did endorse depressed mood when asked directly. She denied significant anxiety. Her son noted that she does not laugh as much and uses humor less than she used to. She continues to enjoy reading, going to the leg, being with family and traveling.  The patient reported good appetite but her son reported concerns that she may forget to eat.   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 She has been employed part-time with [employer] completing income tax returns for the past 32 years. Marital history: The patient was married for 59 years before divorcing. She has 2 adult children. Alcohol/Tobacco/Substances: The patient reportedly consumes an occasional glass of wine. She smokes half a pack of cigarettes per day. No history of illicit substance abuse was reported.  Medical History: Past Medical History:  Diagnosis Date  . Anxiety   . Breast cancer of upper-outer quadrant of right female breast ([place])   . Cataract   . Complication of anesthesia    nausea and vomiting   . COPD (chronic obstructive pulmonary disease) ([place])   . Macular degeneration   . Pneumonia    hx of   . PONV (postoperative  nausea and vomiting)   . Retina hole   . Shortness of breath    increased activity       Current Medications:  Outpatient Encounter Prescriptions as of 10/02/2015  Medication Sig  . anastrozole (ARIMIDEX) 1 MG tablet Take 1 tablet (1 mg total) by mouth daily.  . Cholecalciferol ([employer] D3) 5000 UNITS capsule Take 5,000 Units by mouth every other day.   . Cyanocobalamin (VITAMIN B-12 PO) Take 1 tablet by mouth daily.   Marland Kitchen donepezil (ARICEPT) 5 MG tablet 2 tablets daily.  . fluticasone (FLONASE) 50 MCG/ACT nasal spray Place into both nostrils daily as needed.   . Tiotropium Bromide Monohydrate (SPIRIVA RESPIMAT) 2.5 MCG/ACT AERS Inhale 2 puffs into the lungs as needed. @@ 11 am  . vitamin A 10000 UNIT capsule Take 10,000 Units by mouth every morning.    No facility-administered encounter medications on file as of 10/02/2015.      Behavioral Observations:   Appearance: Casually but appropriately dressed Gait: Ambulated independently Speech: Fluent but sparse; mild word finding difficulty, mildly increased response latencies  Thought process: Appears linear Affect: Blunted but does brighten on occasion Interpersonal: Mildly withdrawn but appropriate overall   TESTING: There is medical necessity to proceed with neuropsychological assessment as the results will be used to aid in differential diagnosis and clinical decision-making and to inform specific treatment recommendations. The patient has  previously been diagnosed with dementia but she is demonstrating new behavioral changes and there is a need to determine etiology of new symptoms.  PLAN: The patient will return for a full battery of neuropsychological testing with a psychometrician under my supervision. Education regarding testing procedures was provided. Subsequently, the patient will see this provider for a follow-up session at which time her test performances and my impressions and treatment recommendations will be reviewed in  detail.   Full neuropsychological evaluation report to follow.

## 2015-10-04 DIAGNOSIS — J449 Chronic obstructive pulmonary disease, unspecified: Secondary | ICD-10-CM | POA: Diagnosis not present

## 2015-10-04 DIAGNOSIS — Z9011 Acquired absence of right breast and nipple: Secondary | ICD-10-CM | POA: Diagnosis not present

## 2015-10-04 DIAGNOSIS — F419 Anxiety disorder, unspecified: Secondary | ICD-10-CM | POA: Diagnosis not present

## 2015-10-04 DIAGNOSIS — M810 Age-related osteoporosis without current pathological fracture: Secondary | ICD-10-CM | POA: Diagnosis not present

## 2015-10-04 DIAGNOSIS — F0150 Vascular dementia without behavioral disturbance: Secondary | ICD-10-CM | POA: Diagnosis not present

## 2015-10-04 DIAGNOSIS — C50411 Malignant neoplasm of upper-outer quadrant of right female breast: Secondary | ICD-10-CM | POA: Diagnosis not present

## 2015-10-04 DIAGNOSIS — Z720 Tobacco use: Secondary | ICD-10-CM | POA: Diagnosis not present

## 2015-10-06 DIAGNOSIS — F0150 Vascular dementia without behavioral disturbance: Secondary | ICD-10-CM | POA: Diagnosis not present

## 2015-10-06 DIAGNOSIS — Z9011 Acquired absence of right breast and nipple: Secondary | ICD-10-CM | POA: Diagnosis not present

## 2015-10-06 DIAGNOSIS — F419 Anxiety disorder, unspecified: Secondary | ICD-10-CM | POA: Diagnosis not present

## 2015-10-06 DIAGNOSIS — M810 Age-related osteoporosis without current pathological fracture: Secondary | ICD-10-CM | POA: Diagnosis not present

## 2015-10-06 DIAGNOSIS — Z720 Tobacco use: Secondary | ICD-10-CM | POA: Diagnosis not present

## 2015-10-06 DIAGNOSIS — C50411 Malignant neoplasm of upper-outer quadrant of right female breast: Secondary | ICD-10-CM | POA: Diagnosis not present

## 2015-10-06 DIAGNOSIS — J449 Chronic obstructive pulmonary disease, unspecified: Secondary | ICD-10-CM | POA: Diagnosis not present

## 2015-10-08 DIAGNOSIS — Z9011 Acquired absence of right breast and nipple: Secondary | ICD-10-CM | POA: Diagnosis not present

## 2015-10-08 DIAGNOSIS — J449 Chronic obstructive pulmonary disease, unspecified: Secondary | ICD-10-CM | POA: Diagnosis not present

## 2015-10-08 DIAGNOSIS — M810 Age-related osteoporosis without current pathological fracture: Secondary | ICD-10-CM | POA: Diagnosis not present

## 2015-10-08 DIAGNOSIS — C50411 Malignant neoplasm of upper-outer quadrant of right female breast: Secondary | ICD-10-CM | POA: Diagnosis not present

## 2015-10-08 DIAGNOSIS — F0150 Vascular dementia without behavioral disturbance: Secondary | ICD-10-CM | POA: Diagnosis not present

## 2015-10-08 DIAGNOSIS — F419 Anxiety disorder, unspecified: Secondary | ICD-10-CM | POA: Diagnosis not present

## 2015-10-08 DIAGNOSIS — Z720 Tobacco use: Secondary | ICD-10-CM | POA: Diagnosis not present

## 2015-10-09 DIAGNOSIS — F0150 Vascular dementia without behavioral disturbance: Secondary | ICD-10-CM | POA: Diagnosis not present

## 2015-10-09 DIAGNOSIS — J449 Chronic obstructive pulmonary disease, unspecified: Secondary | ICD-10-CM | POA: Diagnosis not present

## 2015-10-09 DIAGNOSIS — C50411 Malignant neoplasm of upper-outer quadrant of right female breast: Secondary | ICD-10-CM | POA: Diagnosis not present

## 2015-10-09 DIAGNOSIS — Z9011 Acquired absence of right breast and nipple: Secondary | ICD-10-CM | POA: Diagnosis not present

## 2015-10-09 DIAGNOSIS — Z720 Tobacco use: Secondary | ICD-10-CM | POA: Diagnosis not present

## 2015-10-09 DIAGNOSIS — M810 Age-related osteoporosis without current pathological fracture: Secondary | ICD-10-CM | POA: Diagnosis not present

## 2015-10-09 DIAGNOSIS — F419 Anxiety disorder, unspecified: Secondary | ICD-10-CM | POA: Diagnosis not present

## 2015-10-11 DIAGNOSIS — Z720 Tobacco use: Secondary | ICD-10-CM | POA: Diagnosis not present

## 2015-10-11 DIAGNOSIS — Z9011 Acquired absence of right breast and nipple: Secondary | ICD-10-CM | POA: Diagnosis not present

## 2015-10-11 DIAGNOSIS — C50411 Malignant neoplasm of upper-outer quadrant of right female breast: Secondary | ICD-10-CM | POA: Diagnosis not present

## 2015-10-11 DIAGNOSIS — F0150 Vascular dementia without behavioral disturbance: Secondary | ICD-10-CM | POA: Diagnosis not present

## 2015-10-11 DIAGNOSIS — F419 Anxiety disorder, unspecified: Secondary | ICD-10-CM | POA: Diagnosis not present

## 2015-10-11 DIAGNOSIS — J449 Chronic obstructive pulmonary disease, unspecified: Secondary | ICD-10-CM | POA: Diagnosis not present

## 2015-10-11 DIAGNOSIS — M810 Age-related osteoporosis without current pathological fracture: Secondary | ICD-10-CM | POA: Diagnosis not present

## 2015-10-16 ENCOUNTER — Ambulatory Visit (INDEPENDENT_AMBULATORY_CARE_PROVIDER_SITE_OTHER): Payer: Commercial Managed Care - HMO | Admitting: Psychology

## 2015-10-16 DIAGNOSIS — F0151 Vascular dementia with behavioral disturbance: Secondary | ICD-10-CM | POA: Diagnosis not present

## 2015-10-16 DIAGNOSIS — Z85828 Personal history of other malignant neoplasm of skin: Secondary | ICD-10-CM | POA: Diagnosis not present

## 2015-10-16 DIAGNOSIS — F01518 Vascular dementia, unspecified severity, with other behavioral disturbance: Secondary | ICD-10-CM

## 2015-10-16 DIAGNOSIS — D485 Neoplasm of uncertain behavior of skin: Secondary | ICD-10-CM | POA: Diagnosis not present

## 2015-10-16 DIAGNOSIS — L720 Epidermal cyst: Secondary | ICD-10-CM | POA: Diagnosis not present

## 2015-10-20 DIAGNOSIS — J449 Chronic obstructive pulmonary disease, unspecified: Secondary | ICD-10-CM | POA: Diagnosis not present

## 2015-10-20 DIAGNOSIS — C50411 Malignant neoplasm of upper-outer quadrant of right female breast: Secondary | ICD-10-CM | POA: Diagnosis not present

## 2015-10-20 DIAGNOSIS — F0150 Vascular dementia without behavioral disturbance: Secondary | ICD-10-CM | POA: Diagnosis not present

## 2015-10-20 DIAGNOSIS — M810 Age-related osteoporosis without current pathological fracture: Secondary | ICD-10-CM | POA: Diagnosis not present

## 2015-10-22 NOTE — Progress Notes (Signed)
   Neuropsychology Note  [name] returned today for 2 hours of neuropsychological testing with technician, Milana Kidney, BS, under the supervision of Dr. Macarthur Critchley. The patient did not appear overtly distressed by the testing session, per behavioral observation or via self-report to the technician. Rest breaks were offered. Gabrielle [name] will return within 2 weeks for a feedback session with Dr. Si Raider at which time her test performances, clinical impressions and treatment recommendations will be reviewed in detail. The patient understands she can contact our office should she require our assistance before this time.  Full report to follow.

## 2015-10-22 NOTE — Progress Notes (Signed)
NEUROPSYCHOLOGICAL EVALUATION   Name:    Gabrielle [name]  Date of Birth:   [birth year] Date of Interview:  10/02/2015 Date of Testing:  10/16/2015  Date of Feedback:  10/23/2015      Background Information:  Reason for Referral:  Gabrielle [name] is a 82 y.o. female referred by Dr. Narda Amber to assess her current level of cognitive functioning and assist in differential diagnosis. Gabrielle current evaluation consisted of a review of available medical records, an interview with Gabrielle [name] and her son, and Gabrielle completion of a neuropsychological testing battery. Informed consent was obtained.  History of Presenting Problem:  In August 2015, this [name] was convinced by her family to see a primary care physician due to significant unexplainable weight loss. She had not seen a medical provider in 15-20 years as she had been in relatively good health until that point. Her family also reported forgetfulness at that time. She was found to have a UTI with eColi, as well as vitamin B12 deficiency and vitamin D deficiency. She was referred to Dr. Posey Pronto. Per Dr. Serita Grit initial neurologic consultation note on 10/08/2013, Gabrielle family had been noticing short term memory issues since January 2015 (and noticed these moreso than Gabrielle [name]). There were no unusual behaviors, and no reported problems with IADLs.   Subsequently, Gabrielle [name] was diagnosed with invasive right ductal breast cancer grade 2 with no spread to Gabrielle lymph nodes. She underwent right mastectomy and was treated with chemotherapy.   Gabrielle [name] saw Dr. Posey Pronto on 12/13/2013 for follow-up. It was reported that she continued to have problems with short-term memory, but that her daughter felt this was somewhat better. Her MoCA did improve from 20/30 to 25/30, and it was thought this could be explained by treated UTI.  It was noted she was demonstrating difficulty maintaining her mail and e-mails, had lost her checkbook in her mail, and her daughter said  her home was in disarray. However, Gabrielle family did not report concerns with home safety or with her driving.   Gabrielle next month, Gabrielle [name]'s daughter called Dr. Posey Pronto and stated significant concern about her mother "doing things really out of character like ordering a magazine subscription for $1950.00."  An MRI of Gabrielle brain was completed on 01/22/2014. Gabrielle following impressions were reported: "No acute infarct. Remote medial right thalamic infarct which was partially hemorrhagic with presence of blood breakdown products. Remote tiny inferior cerebellar infarct. Mild to moderate small vessel disease type changes. No intracranial mass or bony destructive lesion to suggest Gabrielle presence of intracranial metastatic disease. Global atrophy without hydrocephalus. Right vertebral artery and right posterior inferior cerebellar artery may be occluded."  Gabrielle [name] was evaluated by Dr. Malon Kindle Ward of [facility] in January 2016 (full report is scanned into Epic). At that time, Gabrielle [name]'s son reported abrupt onset of cognitive and functional difficulties in March 2015 with a subsequent progressive decline in judgment and thinking. Around March 2015, Gabrielle [name] reportedly had difficulty maintaining Gabrielle cleanliness of her home; specifically, paper mail was piling up within her home. Gabrielle [name] was also making several uncharacteristic purchases with no subsequent knowledge of having made Gabrielle purchases. [name] yielded a diagnosis of vascular dementia (mild) with behavioral disturbance. I personally reviewed Gabrielle data from that evaluation and it appears that Gabrielle diagnosis of vascular dementia was made primarily based on neuroimaging results (showing old infarcts and mild to moderate small vessel disease) and behavioral change. In my opinion,  her test results at that time were not consistent with Gabrielle level of functional decline she was demonstrating in her daily life. In fact, most of Gabrielle areas of  relative weakness could have been explained by visual deficits (e.g., macular degeneration).   Over time, Gabrielle [name]'s performances on brief cognitive screenings have actually improved (MoCA=20 in 09/2013, 25 in 11/2013, 24 in 06/2014, and 26 in 08/2015). Meanwhile, she has continued to demonstrate forgetfulness in her daily life, with more prominent decline in judgment and decision making.   Since her last neuropsychological evaluation in January 2016, Gabrielle [name] does endorse short-term memory decline and notes that her son is doing more for her because of this. Her son reported that there are currently 3 fraud investigations underway for different bank/credit accounts of Gabrielle [name]'s. Her son had to take away her computer because it was so fraught with viruses and malware that she could no longer use it. He reported that she was compulsively clicking on pop-up windows on her computer despite being told not to. She continues to hoard paper mail. Her son showed me photographs of piles of mail all throughout her home. It does look like a safety hazard. She refuses to let her family or anyone else help her go through Gabrielle mail. Upon direct questioning, she reported that she is concerned that they would throw important things away. Her son is concerned that her home is becoming dangerous to live in. Additionally she is losing important items, such as bills, among all Gabrielle papers. Gabrielle [name]'s son also reported that Gabrielle [name] has been more forgetful; for example, she frequently forgets her purse when she leaves Gabrielle house. She also got lost driving to a familiar location just recently. Of note, her son reported that Gabrielle [name] does not seem to be concerned by Gabrielle financial scams and fraudulent activity in her accounts. Upon direct questioning, Gabrielle [name]'s son also endorses forgetfulness for recent conversations, as well as reduced auditory comprehension.  Prior psychiatric history was denied.    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 Posey Pronto. Her son would like her to stop doing this work since he feels her cognitive difficulties make her more prone to making errors. She would like to continue working in tax season "for 1 more year". Gabrielle [name] continues to live alone. A home healthcare service has recently started coming to her home. Her son noted that Gabrielle family is unsure if she is safe to live independently. Gabrielle [name] continues to drive. Her son reported that he is not concerned about her ability to drive locally but does not think she should be driving long distances. As noted previously, Gabrielle [name] did become lost recently when driving to [place] (a route that she is very familiar with). Gabrielle [name] does minimal cooking, without any reported difficulty. Her son has taken over management of her medications and finances, at Gabrielle recommendation of Dr. Posey Pronto and other healthcare providers. Her daughter is assisting with appointment tracking.  Gabrielle [name] denied any significant difficulty with balance or walking. She did stumble and [place] She denied injury or loss of consciousness.  Gabrielle [name] and her son reported significantly reduced hearing. She has not been to an audiologist.  Physically, Gabrielle [name] complained of low energy. It appears this has been a  complaint over Gabrielle last two years. She denied significant sleep disturbance. She reportedly has finished treatment for breast cancer.  With regard to mood, Gabrielle [name] reported that she is "about Gabrielle same". However, she did endorse depressed mood when asked directly. She denied significant anxiety. Her son noted that she does not laugh as much and uses humor less than she used to. She continues to enjoy reading, [place], being with  family and traveling.  Gabrielle [name] reported good appetite but her son reported concerns that she may forget to eat.   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 She retired in 1983. 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 Marital history: Gabrielle [name] was married for 66 years before divorcing. She has 2 adult children. Alcohol/Tobacco/Substances: Gabrielle [name] reportedly consumes an occasional glass of wine. She smokes half a pack of cigarettes per day. No history of illicit substance abuse was reported.   Medical History:  Past Medical History:  Diagnosis Date  . Anxiety   . Breast cancer of upper-outer quadrant of right female breast ([place])   . Cataract   . Complication of anesthesia    nausea and vomiting   . COPD (chronic obstructive pulmonary disease) ([place])   . Macular degeneration   . Pneumonia    hx of   . PONV (postoperative nausea and vomiting)   . Retina hole   . Shortness of breath    increased activity     Current medications:  Outpatient Encounter Prescriptions as of 10/23/2015  Medication Sig  . anastrozole (ARIMIDEX) 1 MG tablet Take 1 tablet (1 mg total) by mouth daily.  . Cholecalciferol ([employer] D3) 5000 UNITS capsule Take 5,000 Units by mouth every other day.   . Cyanocobalamin (VITAMIN B-12 PO) Take 1 tablet by mouth daily.   Marland Kitchen donepezil (ARICEPT) 5 MG tablet 2 tablets daily.  . fluticasone (FLONASE) 50 MCG/ACT nasal spray Place into both nostrils daily as needed.   . Tiotropium Bromide Monohydrate (SPIRIVA RESPIMAT) 2.5 MCG/ACT AERS Inhale 2 puffs into Gabrielle lungs as needed. @@ 11 am  . vitamin A 10000 UNIT capsule Take  10,000 Units by mouth every morning.    No facility-administered encounter medications on file as of 10/23/2015.    Gabrielle [name] and her son reported that she has finished treatment for breast cancer (so presumably she is no longer taking anastrozole).   Current Examination:  Behavioral Observations:  Appearance: Casually but appropriately dressed Gait: Ambulated independently Speech: Fluent but sparse; mild word finding difficulty, mildly increased response latencies  Thought process: Appears linear Affect: Blunted but does brighten on occasion Interpersonal: Mildly withdrawn but appropriate overall Orientation: Gabrielle [name] was oriented to person, time and city. She did not know Gabrielle name of Gabrielle office or street. She accurately named Gabrielle current President and his predecessor.  During Gabrielle clinical interview, Gabrielle [name] appeared unconcerned about current troubling circumstances (e.g., fraud and scams) her son was reporting. She appears to have little concern and insight into these issues.  Tests Administered: . Test of Premorbid Functioning (TOPF) . Wechsler Adult Intelligence Scale-Fourth Edition (WAIS-IV): Similarities, Block Design, Matrix Reasoning, Arithmetic, Symbol Search, Coding and Digit Span  subtests . Wechsler Memory Scale-Fourth Edition (WMS-IV) Older Adult Version (ages 61-90): Logical Memory I, II and Recognition subtests  . [employer] Verbal Learning Test - 2nd Edition (CVLT-2) Short Form . Repeatable Battery for Gabrielle Assessment of Neuropsychological Status (RBANS) Form A:  Figure Copy and Recall subtests . Neuropsychological Assessment Battery (NAB) Language Module, Form 1: Auditory Comprehension, Naming, and Reading Comprehension Subtests . Controlled Oral Word Association Test (COWAT) . Trail Making Test A and B . Clock drawing test . [employer] [facility]) . Cognitive Competency Verbal Reasoning . Geriatric Depression Scale (GDS) 15 Item . Generalized Anxiety  Disorder - 7 item screener (GAD-7)  Test Results: Note: Standardized scores are presented only for use by appropriately trained professionals and to allow for any future test-retest comparison. These scores should not be interpreted without consideration of all Gabrielle information that is contained in Gabrielle rest of Gabrielle report. Gabrielle most recent standardization samples from Gabrielle test publisher or other sources were used whenever possible to derive standard scores; scores were corrected for age, gender, ethnicity and education when available.   Test Scores:     Description of Test Results:  Premorbid verbal intellectual abilities were estimated to have been within Gabrielle average range based on a test of word reading. Psychomotor processing speed was average overall. Auditory attention and working memory were average. Visual-spatial construction was low average to average. Language abilities were mostly within normal limits. Specifically, confrontation naming was low average, as was auditory comprehension. Reading comprehension was intact. Semantic verbal fluency was borderline impaired, representing mild decline since her last evaluation. With regard to verbal memory, encoding and acquisition of non-contextual information (i.e., word list) was low average, reflecting improvement relative to her last evaluation. After a brief distracter task, free recall was average, again reflecting improvement. After a delay, free recall was superior (improved since last evaluation). Cued recall was average (improved since last evaluation). Performance on a yes/no recognition task was average (improved since last evaluation). On another verbal memory test, encoding and acquisition of contextual auditory information (i.e., short story) was superior (improved since last evaluation). After a delay, free recall was average (slightly improved since last evaluation). With regard to non-verbal memory, delayed free recall of visual  information was average. Executive functioning was generally intact. Mental flexibility and set-shifting were average on Trails B (improved since last evaluation). She also performed within Gabrielle average range on another test of set-shifting and problem solving. Verbal fluency with phonemic search restrictions was borderline (consistent with last evaluation). Verbal abstract reasoning was average. Non-verbal abstract reasoning was average. On a test measuring reasoning and judgment in hypothetical everyday challenges/circumstances, Gabrielle [name]'s responses were normal and not indicative of reduced competency (improved since last evaluation). Performance on a clock drawing task was normal. On self-report questionnaires, Gabrielle [name] did endorse mild depression and anxiety. She reported dissatisfaction with life, feelings of emptiness, decreased happiness, feelings of worthlessness, reduced energy and hopelessness. She also reported daily inability to control her worrying, as well as mild nervousness and worrying too much about different things.   Clinical Impressions: Anxiety disorder, unspecified (with hoarding and compulsive behaviors). Cognitive diagnosis deferred. Results of cognitive testing do not indicate dementia, and in fact she evidenced several areas of improvement compared to her last evaluation over a year and a half ago. (This is also consistent with her improvements on brief cognitive screenings over Gabrielle past two years.) Furthermore, in reviewing Gabrielle test data from her prior neuropsychological evaluation, it appears that her  diagnosis of vascular dementia was made more on Gabrielle basis of neuroimaging and behavioral changes, rather than performance on cognitive testing.   Nonetheless, cognitive testing (presently and also in Gabrielle past) is clearly not capturing Gabrielle functional problems this [name] is having in everyday life (e.g., reduced judgment, compulsive/hoarding behavior, diminished insight). Even on  frontal tasks and tasks assessing judgment/reasoning in hypothetical scenarios, she performed within Gabrielle normal range.   When there is significant behavior change and no identifiable deficits on neurocognitive testing, as is Gabrielle case for this [name], psychiatric etiology must be considered. I am not sure why Gabrielle [name] would develop a psychiatric condition in recent years, but based on Gabrielle available data at this time, it does seem more probable that etiology is psychiatric rather than neurological (i.e., neurotransmitter related as opposed to lesion related). However, to be safe, I would suggest updated MRI to rule out new lesion.   Additionally, it is possible that Gabrielle [name] is experiencing significant fluctuations in cognitive functioning, which would explain why she did well on cognitive testing but has difficulty with following simple driving directions, etc. Regardless, vascular dementia would not explain these significant fluctuations.  Paraneoplastic syndrome could be considered, but I would expect to see cognitive impairment on testing if this were present. Further workup for mets/new cancer may be considered.   Gabrielle [name] had a UTI in Gabrielle past which appeared to cause increased confusion. She should probably be regularly screened for these.   Recommendations/Plan: Based on Gabrielle findings of Gabrielle present evaluation, Gabrielle following recommendations are offered:  1. Updated brain MRI could be considered to rule out new lesion and/or brain metastasis.  2. Rule out UTI if this has not already been done recently. (Gabrielle [name] had a UTI in Gabrielle past which appeared to cause increased confusion. She should probably be regularly screened for these.) 3. If Gabrielle above are negative, and if there is no indication of new cancer in Gabrielle body (that could be causing paraneoplastic syndrome), treatment for psychiatric symptoms (e.g., hoarding, compulsions) with SSRI could be considered, pending physician approval.  Regular monitoring of response to medication and effectiveness would be indicated. 4. I would be happy to re-evaluate Gabrielle [name] in 6-12 months if she continues to demonstrate functional/cognitive decline. 5. Despite intact cognitive testing, Gabrielle actual occurrences in her life and her current functioning are quite concerning and pose a threat to her safety and well-being. Her children should continue to manage all finances, appointments and medications. She should have increased supervision (I agree with decision to have home healthcare). I recommend that an agreement be made that if she is to continue living in her home, she needs to allow someone else to go through all her mail (both Gabrielle hoarded mail, and as it comes in). I agree that she should not have access to her computer, and I suggest that all incoming phone calls be screened, inorder to minimize risk of undue influence/scams. Since she is prone to getting lost when driving even familiar routes, I am also recommending that she discontinue driving at this time, at least pending further workup/treatment.   Feedback to [name]: Gabrielle [name] and her son [name]) and daughter-in-law Sharee Pimple) returned for a feedback appointment on 10/22/2015 to review Gabrielle results of her neuropsychological evaluation with this provider. 60 minutes face-to-face time was spent reviewing her test results, my impressions and my recommendations as detailed above. I spoke at length with Gabrielle family about Gabrielle atypical factors of this [name]'s case (  e.g., normal neurocognitive testing, improvement since last evaluation despite functional decline). They will follow up with Dr. Posey Pronto to decide on next steps.   Total time spent on this [name]'s case: [id number] unit for interview with psychologist; [phone] units of testing by psychometrician under psychologist's supervision; [phone] units for medical record review, scoring of neuropsychological tests, interpretation of test results,  preparation of this report, and review of results to Gabrielle [name] by psychologist.      Thank you for your referral of Gabrielle [name]. Please feel free to contact me if you have any questions or concerns regarding this report.

## 2015-10-23 ENCOUNTER — Ambulatory Visit (INDEPENDENT_AMBULATORY_CARE_PROVIDER_SITE_OTHER): Payer: Commercial Managed Care - HMO | Admitting: Psychology

## 2015-10-23 ENCOUNTER — Encounter: Payer: Self-pay | Admitting: Psychology

## 2015-10-23 DIAGNOSIS — R413 Other amnesia: Secondary | ICD-10-CM

## 2015-10-23 DIAGNOSIS — F423 Hoarding disorder: Secondary | ICD-10-CM

## 2015-11-26 ENCOUNTER — Encounter: Payer: Self-pay | Admitting: Neurology

## 2015-11-26 ENCOUNTER — Ambulatory Visit (INDEPENDENT_AMBULATORY_CARE_PROVIDER_SITE_OTHER): Payer: Commercial Managed Care - HMO | Admitting: Neurology

## 2015-11-26 ENCOUNTER — Other Ambulatory Visit (INDEPENDENT_AMBULATORY_CARE_PROVIDER_SITE_OTHER): Payer: Commercial Managed Care - HMO

## 2015-11-26 VITALS — BP 110/70 | HR 86 | Ht 66.0 in | Wt 107.6 lb

## 2015-11-26 DIAGNOSIS — F0391 Unspecified dementia with behavioral disturbance: Secondary | ICD-10-CM

## 2015-11-26 DIAGNOSIS — F423 Hoarding disorder: Secondary | ICD-10-CM

## 2015-11-26 DIAGNOSIS — F03918 Unspecified dementia, unspecified severity, with other behavioral disturbance: Secondary | ICD-10-CM

## 2015-11-26 DIAGNOSIS — R4681 Obsessive-compulsive behavior: Secondary | ICD-10-CM | POA: Diagnosis not present

## 2015-11-26 LAB — TSH: TSH: 1.61 u[IU]/mL (ref 0.35–4.50)

## 2015-11-26 LAB — VITAMIN B12

## 2015-11-26 LAB — SEDIMENTATION RATE: Sed Rate: 13 mm/hr (ref 0–30)

## 2015-11-26 MED ORDER — DONEPEZIL HCL 10 MG PO TABS: ORAL_TABLET | ORAL | 3 refills | Status: DC

## 2015-11-26 NOTE — Progress Notes (Signed)
Follow-up Visit   Date: 11/26/15    Gabrielle [name] MRN: [id number] DOB: [birth year]   Interim History: Gabrielle [name] is a 82 y.o. right-handed Caucasian female with history of tobacco use returning to the clinic for follow-up of vascular dementia.  The patient was accompanied to the clinic by son who also provides collateral information.    History of present illness: Since January 2015, her son noticed changes in her mother's cognitive ability. She has been having problems with short-term memory and not has "sharp" as she used to be. She is highly functioning at baseline and works part-time at [employer] and has not noticed any problems at work. Problems are mostly with short-term memory such as remembering names, dates, appointments and she has started to write things down more. Her son and daughter have noticed the changes more than patient. Hobbies include reading, working, and lately she has been organizing her ex-husbands home since he passed away last year. They had been divorced for 20+ years.   She is driving without any difficulty. She denies getting lost or being involved in any car accidents. She cooks for herself and eats 2-3 meals per day. Her son says that her response is slower, such as when talking in conversation. Her mood is good. Sleep is fair, averaging 6-7 hours per night. No bizarre or inappropriate behavior changes.   She has not had regular medical care since 2000.  Of note, she has lost 50lb unintentionally in the past 13-month. Appetite remains good.   UPDATE 12/13/2013:  She was found to have right breast cancer and is scheduled to have mastectomy.  She is having a harder time learning new tasks and continues to have problems with short-term memory.  She has difficulty maintaining her mail, e-mails, lost her checkbook in her mail, and daughter says her home is in disarray.  She usually eats about two meals a day, usually gets takeout or eats frozen meals.  Family  does not have any concerns with home safety or with her driving. She has not been involved in any motor vehicle accidents. On a rare occasion, she does admit to getting lost.  UPDATE 04/24/2014:  She is accompanied by her son, KChrissie Noa  She underwent right mastectomy and is now undergoing chemotherapy. Since her last visit, her daughter contacted our office with concerns that patient was ordering $1200 of online magazine which is atypical for her.  Neuropsychology testing showed evidence of vascular dementia (mild).  Despite recommendation to stop working by her son and daughter, patient continue to file taxes and works for [employer] as well as drive locally.  UPDATE 07/25/2014:  She called her son in a panic that she lost her wallet, but then found it on the floor.  Her [employer] cable was late in getting paid and her service was cut.  She is behind on her medical bills.  Her son, KChrissie Noa is her POA. They are interested in seeking driving assessment.  She is locked out of her facebook and brokerage accounts and accepting calls from people that she is not familiar with.   She stopped working on April 18th, but against medical recommendation, she did continue to file taxes for clients.    UPDATE 09/25/2015:  She is here with her son and daughter-in-law.  She was last seen in May 2016.  Reviewing the notes from her PCP indicate that patient did not follow-up here due to my recommendations that she stop working and driving based  on her cognitive impairment.  Her son states that her home situation has become worse over the past year, especially with respect to her finances.  She has three active fraud investigations regarding her checking account, [employer] visa card, and [employer] checking account.  Her computer has been hacked and has multiple viruses because she opens emails from people that she does not know.  They are concerned that she is also taking phone calls and agreeing to certain transactions, unknowingly by  scam artists.    She continues to live alone and family has noticed that she is hoarding mail and not able to keep up with cleanliness of her home.  She feels the need to open every article of mail that is delivered and has signed up for numerous newsletters and promotions.  Despite my recommendations, she continued to work this year and drive. Family is concerned about whether she takes her medications and eats three meals a day.  They are also worried about her fall risk because she has so much mail all over her home, including the floor. She does not allow her children to help discard unwanted mail or clean up.   Her son has tried very hard to take control of her finances and confiscated her computer.  However, she still has her phone and ipad.  Patient does not seem to be acknowledge that there is any problem.  Mood is good.  UPDATE 11/26/2015:  She presents for follow-up.  Her son (POA) has taken greater responsibility over her mail and finances which has greatly alleviated problems with fraud.  Her neuropsychological testing, interestingly, did not demonstrate cognitive deficits which is inconsistent with her clinical exam and symptoms.  There is anxiety and behavior changes, which family and patient endorse.  She continues to receive 45 calls/day from unknown numbers, some which she answers.   Medications:  Current Outpatient Prescriptions on File Prior to Visit  Medication Sig Dispense Refill  . anastrozole (ARIMIDEX) 1 MG tablet Take 1 tablet (1 mg total) by mouth daily. 90 tablet 3  . Cholecalciferol ([employer]S D3) 5000 UNITS capsule Take 5,000 Units by mouth every other day.     . Cyanocobalamin (VITAMIN B-12 PO) Take 1 tablet by mouth daily.     . fluticasone (FLONASE) 50 MCG/ACT nasal spray Place into both nostrils daily as needed.     . Tiotropium Bromide Monohydrate (SPIRIVA RESPIMAT) 2.5 MCG/ACT AERS Inhale 2 puffs into the lungs as needed. @@ 11 am    . vitamin A 10000 UNIT capsule Take  10,000 Units by mouth every morning.      No current facility-administered medications on file prior to visit.     Allergies: No Known Allergies   Review of Systems:  CONSTITUTIONAL: No fevers, chills, night sweats, +weight loss.   EYES: No visual changes or eye pain ENT: No hearing changes.  No history of nose bleeds.   RESPIRATORY: No cough, wheezing and shortness of breath.   CARDIOVASCULAR: Negative for chest pain, and palpitations.   GI: Negative for abdominal discomfort, blood in stools or black stools.  No recent change in bowel habits.   GU:  No history of incontinence.   MUSCLOSKELETAL: No history of joint pain or swelling.  No myalgias.   SKIN: Negative for lesions, rash, and itching.   ENDOCRINE: Negative for cold or heat intolerance, polydipsia or goiter.   PSYCH:  No  depression or anxiety symptoms.   NEURO: As Above.   Vital Signs:  BP 110/70  Pulse 86   Ht _0  (1.676 m)   Wt 107 lb 9 oz (48.8 kg)   SpO2 93%   BMI 17.36 kg/m   Neurological Exam:  MENTAL STATUS:   Montreal Cognitive Assessment  09/25/2015 07/25/2014 12/13/2013 10/08/2013  Visuospatial/ Executive (0/5) _1 Naming (0/3) _2 Attention: Read list of digits (0/2) _3 Attention: Read list of letters (0/1) _4 Attention: Serial 7 subtraction starting at 100 (0/3) _5 Language: Repeat phrase (0/2) _6 Language : Fluency (0/1) 0 0 0 0  Abstraction (0/2) _7 Delayed Recall (0/5) _8 Orientation (0/6) _9 Total _10 Adjusted Score (based on education) _11 Awake, flat affect, oriented to person, place, date, year.  Poor spontaneous speech, answers only when asked direct questions.  Poor eye contact.   Impaired judgement.   CRANIAL NERVES: Pupils equal round and reactive to light.   Face is symmetric.   MOTOR:  Motor strength is 5-/5 in all extremities. Tone is normal.    COORDINATION/GAIT:    Stooped posture, gait narrow based and  stable.  Tandem gait intact.   Data: Lab Results  Component Value Date   VITAMINB12 >1500 (H) 11/26/2015   Lab Results  Component Value Date   TSH 1.61 11/26/2015   MRI brain 01/22/2014: No acute infarct. Remote medial right thalamic infarct which was partially hemorrhagic with presence of blood breakdown products. Remote tiny inferior cerebellar infarct. Mild to moderate small vessel disease type changes.  No intracranial mass or bony destructive lesion to suggest the presence of intracranial metastatic disease.  Global atrophy without hydrocephalus.  Right vertebral artery and right posterior inferior cerebellar artery may be occluded.  Neuropsychiatry testing at [facility] January 2016:  Neuropsychology testing showed evidence of vascular dementia (mild) Neuropsychology testing August 2017:  Anxiety disorder, cognitive disorder deferred   IMPRESSION: Ms. Upton is 82 year old female with returning with dementia with behavior changes. Interstingly, her most recent neuropsychological testing did not show evidence of cognitive disorder.  I still feel that she may have more behavior changes than dementia, but she has clear problems at home making sound financial decision and there is obsessive compulsive behaviors regarding her mail.  Fortunately, her son is taking responsibility over her finances and is forwarding her mail to a mailbox which he will manage.  She still continues to receive about 45 calls/d and sometimes signs up with various promotions.  Again, I stressed the importance of changing her telephone number to minimize further scams.  Because of the inconsistency between her symptoms and cognitive results, I will repeat MRI brain wwo contrast to look for interval changes.  If there is acute worsening in her behavior, CSF testing looking for encephalitis may be indicated.    PLAN/RECOMMENDATIONS:  1.  MRI brain wwo contrast 2.  Check ESR, vitamin B12, vitamin B1, TSH,  vitamin E, RPR 3.  Continue Aricept 30m daily  4.  Continue aspirin 833mdaily 5.  Recommend psychiatry referral to address behavior changes of impulsivity/compulsions and anxiety  Return to clinic in 6 months   The duration of this appointment visit was 30 minutes of face-to-face time with the patient.  Greater than 50% of this time was spent in counseling, explanation of diagnosis, planning of further management, and coordination  of care.   Thank you for allowing me to participate in patient's care.  If I can answer any additional questions, I would be pleased to do so.    Sincerely,    Duncan Alejandro K. Posey Pronto, DO

## 2015-11-26 NOTE — Patient Instructions (Addendum)
1.  MRI brain with and without contrast 2.  Check blood work 3.  Referral to psychiatry for anxiety, compulsivity, and impulsivity 4.  Continue Aricept 10mg  daily 5.  Add 2-3 canes daily of Ensure 6.  Get a new telephone number  Return to clinic in 6 months

## 2015-11-27 ENCOUNTER — Other Ambulatory Visit: Payer: Self-pay | Admitting: *Deleted

## 2015-11-27 DIAGNOSIS — F0390 Unspecified dementia without behavioral disturbance: Secondary | ICD-10-CM

## 2015-11-27 LAB — RPR: RPR: REACTIVE — AB

## 2015-11-27 LAB — RPR TITER: RPR Titer: 1:2 {titer}

## 2015-11-27 LAB — FLUORESCENT TREPONEMAL AB(FTA)-IGG-BLD: FLUORESCENT TREPONEMAL ABS: NONREACTIVE

## 2015-11-29 LAB — VITAMIN E
GAMMA-TOCOPHEROL (VIT E): 1 mg/L (ref <=4.3)
VITAMIN E (ALPHA TOCOPHEROL): 13.9 mg/L (ref 5.7–19.9)

## 2015-11-30 LAB — VITAMIN B1: VITAMIN B1 (THIAMINE): 13 nmol/L (ref 8–30)

## 2015-12-04 ENCOUNTER — Encounter: Payer: Self-pay | Admitting: Neurology

## 2015-12-08 ENCOUNTER — Telehealth: Payer: Self-pay | Admitting: Oncology

## 2015-12-08 NOTE — Telephone Encounter (Signed)
MOVED 10/12 LAB/FU FROM HB TO GM...... TIME ADJUSTED BY 30 MINUTES FROM 1:45 PM TO 2:15 PM. LEFT MESSAGE FOR PATIENT.

## 2015-12-10 ENCOUNTER — Other Ambulatory Visit: Payer: Self-pay

## 2015-12-10 DIAGNOSIS — C50411 Malignant neoplasm of upper-outer quadrant of right female breast: Secondary | ICD-10-CM

## 2015-12-11 ENCOUNTER — Ambulatory Visit (HOSPITAL_COMMUNITY)
Admission: RE | Admit: 2015-12-11 | Discharge: 2015-12-11 | Disposition: A | Discharge status: Home / Self Care | Payer: Commercial Managed Care - HMO | Source: Ambulatory Visit | Attending: Oncology | Admitting: Oncology

## 2015-12-11 ENCOUNTER — Ambulatory Visit (HOSPITAL_BASED_OUTPATIENT_CLINIC_OR_DEPARTMENT_OTHER): Payer: Commercial Managed Care - HMO | Admitting: Oncology

## 2015-12-11 ENCOUNTER — Other Ambulatory Visit: Payer: Self-pay | Admitting: *Deleted

## 2015-12-11 ENCOUNTER — Other Ambulatory Visit (HOSPITAL_BASED_OUTPATIENT_CLINIC_OR_DEPARTMENT_OTHER): Payer: Commercial Managed Care - HMO

## 2015-12-11 VITALS — BP 120/42 | HR 77 | Temp 98.7°F | Resp 18 | Ht 66.0 in | Wt 109.4 lb

## 2015-12-11 DIAGNOSIS — Z79811 Long term (current) use of aromatase inhibitors: Secondary | ICD-10-CM

## 2015-12-11 DIAGNOSIS — Z170 Estrogen receptor positive status [ER+]: Secondary | ICD-10-CM

## 2015-12-11 DIAGNOSIS — C50411 Malignant neoplasm of upper-outer quadrant of right female breast: Secondary | ICD-10-CM

## 2015-12-11 DIAGNOSIS — F17210 Nicotine dependence, cigarettes, uncomplicated: Secondary | ICD-10-CM | POA: Insufficient documentation

## 2015-12-11 DIAGNOSIS — R079 Chest pain, unspecified: Secondary | ICD-10-CM | POA: Diagnosis not present

## 2015-12-11 DIAGNOSIS — J439 Emphysema, unspecified: Secondary | ICD-10-CM | POA: Diagnosis not present

## 2015-12-11 DIAGNOSIS — J449 Chronic obstructive pulmonary disease, unspecified: Secondary | ICD-10-CM | POA: Diagnosis not present

## 2015-12-11 DIAGNOSIS — R911 Solitary pulmonary nodule: Secondary | ICD-10-CM

## 2015-12-11 DIAGNOSIS — F329 Major depressive disorder, single episode, unspecified: Secondary | ICD-10-CM

## 2015-12-11 DIAGNOSIS — M810 Age-related osteoporosis without current pathological fracture: Secondary | ICD-10-CM

## 2015-12-11 LAB — COMPREHENSIVE METABOLIC PANEL
ALT: 22 U/L (ref 0–55)
ANION GAP: 9 meq/L (ref 3–11)
AST: 26 U/L (ref 5–34)
Albumin: 3.4 g/dL — ABNORMAL LOW (ref 3.5–5.0)
Alkaline Phosphatase: 67 U/L (ref 40–150)
BILIRUBIN TOTAL: 0.9 mg/dL (ref 0.20–1.20)
BUN: 14.1 mg/dL (ref 7.0–26.0)
CHLORIDE: 104 meq/L (ref 98–109)
CO2: 28 meq/L (ref 22–29)
CREATININE: 1 mg/dL (ref 0.6–1.1)
Calcium: 9.2 mg/dL (ref 8.4–10.4)
EGFR: 53 mL/min/{1.73_m2} — AB (ref >90)
GLUCOSE: 192 mg/dL — AB (ref 70–140)
Potassium: 3.9 mEq/L (ref 3.5–5.1)
SODIUM: 141 meq/L (ref 136–145)
TOTAL PROTEIN: 6.4 g/dL (ref 6.4–8.3)

## 2015-12-11 LAB — CBC WITH DIFFERENTIAL/PLATELET
BASO%: 0.5 % (ref 0.0–2.0)
Basophils Absolute: 0 10*3/uL (ref 0.0–0.1)
EOS%: 0.8 % (ref 0.0–7.0)
Eosinophils Absolute: 0.1 10*3/uL (ref 0.0–0.5)
HCT: 40.8 % (ref 34.8–46.6)
HGB: 13.9 g/dL (ref 11.6–15.9)
LYMPH%: 21.1 % (ref 14.0–49.7)
MCH: 33.2 pg (ref 25.1–34.0)
MCHC: 34.1 g/dL (ref 31.5–36.0)
MCV: 97.4 fL (ref 79.5–101.0)
MONO#: 0.5 10*3/uL (ref 0.1–0.9)
MONO%: 5.8 % (ref 0.0–14.0)
NEUT%: 71.8 % (ref 38.4–76.8)
NEUTROS ABS: 5.7 10*3/uL (ref 1.5–6.5)
Platelets: 214 10*3/uL (ref 145–400)
RBC: 4.19 10*6/uL (ref 3.70–5.45)
RDW: 13.1 % (ref 11.2–14.5)
WBC: 8 10*3/uL (ref 3.9–10.3)
lymph#: 1.7 10*3/uL (ref 0.9–3.3)

## 2015-12-11 MED ORDER — MIRTAZAPINE 15 MG PO TABS: 15.0000 mg | ORAL_TABLET | Freq: Every day | ORAL | 4 refills | Status: DC

## 2015-12-11 MED ORDER — ANASTROZOLE 1 MG PO TABS: 1.0000 mg | ORAL_TABLET | Freq: Every day | ORAL | 3 refills | Status: DC

## 2015-12-11 NOTE — Progress Notes (Signed)
[place]  [phone]) [phone] [phone]     ID: Gabrielle [name] DOB: May 31, 1930  MR#: [id number]  ZHY#:[id number]  Patient Care Team: Ma Hillock, DO as PCP - General (Family Medicine) Excell Seltzer, MD as Consulting Physician (General Surgery) Chauncey Cruel, MD as Consulting Physician (Oncology) Arloa Koh, MD as Consulting Physician (Radiation Oncology) Jarome Matin, MD as Consulting Physician (Dermatology) Brand Males, MD as Consulting Physician (Pulmonary Disease) Alda Berthold, DO as Consulting Physician (Neurology) OTHER MD: Joylene John, DDS  CHIEF COMPLAINT: Triple positive breast cancer  CURRENT TREATMENT:  Anastrozole  BREAST CANCER HISTORY: From the original intake note:  [name] had not been to a doctor for "more than 20 years. Since Christmas 2014 and the family has become increasingly concerned that she appeared to be losing weight and was a bit more confused. They asked the patient how much weight she had lost and she said she had lost "about 50 pounds.". However, the only weight we have available before August of this year is from August of 2014 and it was 115 pounds at that time.  Nevertheless with this concern the patient agreed to be seen by a physician and on 10/06/2013 she was seen by Dr. Kathlen Mody who obtained a full battery of tests. He found the patient to have a urinary tract infection with Escherichia coli, which may explain some of her confusion. Vitamin B 12 level was 215, which is in the low normal range. Hemoglobin was 14.7 with an MCV of 99.0. The patient was started on B12 supplementation parenterally and a chest x-ray was obtained 10/05/2013 which showed evidence of emphysema and prior granulomatous disease. To make sure an occult cancer was not being missed a CT scan of the chest abdomen and pelvis was obtained 11/19/2013. This showed a calcified granuloma in the inferior lingula. There was also a noncalcified 6 mm  nodule in the lateral portion of the right lower lobe. There was no mediastinal mass or adenopathy. There were calcified granulomata in the spleen as well. There was significant atherosclerotic calcifications. There was sigmoid diverticulosis noted incidentally. In addition, a 1.8 cm right breast mass was noted.  The breast mass was evaluated further with bilateral diagnostic mammography and right breast ultrasonography of the breast Center 11/26/2013. The breast density was category C. There was indeed an irregular mass in the right breast which was on palpation firm nontender and noted at the 10:30 position. Ultrasound confirmed a hypoechoic irregular mass measuring up to 3 cm. There was no right axillary adenopathy noted.  Biopsy of the right breast mass in question 11/26/2013 showed ([id number]) and invasive ductal carcinoma, grade 2, estrogen receptor 100% positive, progesterone receptor 59% positive, both with strong staining intensity, with an MIB-1 of 31%, and with HER-2 amplification, the signals ratio being 2.79 and the copy number per cell 5.45.  On 11-2013 the patient underwent bilateral breast MRI. This showed the enhancing mass in the upper-outer quadrant of the right breast to measure 2.1 cm. There was no other findings in the right breast, left breast, or either axilla.  The patient's subsequent history is as detailed below  INTERVAL HISTORY: Jaeda returns today for follow-up of her estrogen receptor positive breast cancer accompanied by her daughter [name]. Jaclin continues on anastrozole, which she tolerates well.  Hot flashes and vaginal dryness are not a major issue. She never developed the arthralgias or myalgias that many patients can experience on this medication. She obtains it at a good  price.   REVIEW OF SYSTEMS: [name] does continue to smoke. She has no intention of quitting at this point. She finds it difficult to gain weight and has been eating more sweets as a  result. She complains of chills, insomnia, fatigue, problems with her dentures, shortness of breath usually when performing any significant activity, some cough mostly dry but occasionally productive, but no hemoptysis, pleurisy, or purulent sputum. There have been no intercurrent fevers. She describes herself as anxious and depressed, mostly because she is alone so much. She does not exercise regularly. A detailed review of systems was otherwise stable.   PAST MEDICAL HISTORY: Past Medical History:  Diagnosis Date  . Anxiety   . Breast cancer of upper-outer quadrant of right female breast ([place])   . Cataract   . Complication of anesthesia    nausea and vomiting   . COPD (chronic obstructive pulmonary disease) ([place])   . Macular degeneration   . Pneumonia    hx of   . PONV (postoperative nausea and vomiting)   . Retina hole   . Shortness of breath    increased activity     PAST SURGICAL HISTORY: Past Surgical History:  Procedure Laterality Date  . ABDOMINAL HYSTERECTOMY  1980   (ovaries intact)  . APPENDECTOMY    . CATARACT EXTRACTION Bilateral   . RETINAL LASER PROCEDURE    . TOTAL MASTECTOMY Right 12/18/2013   Procedure: RIGHT TOTAL MASTECTOMY;  Surgeon: Excell Seltzer, MD;  Location: [facility];  Service: General;  Laterality: Right;    FAMILY HISTORY Family History  Problem Relation Age of Onset  . Heart attack Mother     Deceased  . Cancer Mother     fallopian tube cancer in 28s; deceased 80  . Throat cancer Father     Deceased 84; smoker  . Prostate cancer Brother 37    Currently 39  . Other Brother     Deceased, hemorhhage of pancreas  . Leukemia Maternal Aunt   . Breast cancer Paternal Aunt     age at diagnosis unknown  . Throat cancer Paternal Uncle     Deceased 80s; smoker  . Leukemia Paternal Grandmother    the patient's father died at the age of 47 from throat cancer which has been diagnosed to years before. The patient's mother was diagnosed at age 48 with  fallopian tube carcinoma. She died at age 2. There is no other history of breast or ovarian cancer in the family to the patient's knowledge  GYNECOLOGIC HISTORY:  No LMP recorded. Patient has had a hysterectomy. Menarche age 71, first live birth age 51. The patient is GX P2. She underwent hysterectomy at a relatively young age, without salpingo-oophorectomy. She did not take hormone replacement however she took birth control pills remotely for approximately 2 years with no complications.  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 She is divorced. Her son Nakia Remmers lives in [place] and works as a Metallurgist. Daughter Anamari Galeas lives in [place] where she works as a Architect for the [employer]. Incidentally Dr. Kathe Becton is a nephew of the patient    ADVANCED DIRECTIVES: In place; the patient's son Yvone Neu is her healthcare power of attorney. He can be reached at [phone]- [place]: Social History  Substance Use Topics  . Smoking status: Current Every Day Smoker    Packs/day: 0.50    Years: 40.00  Types: Cigarettes  . Smokeless tobacco: Never Used  . Alcohol use 1.2 oz/week    2 Glasses of wine per week     Comment: wine a couple times weekly     Colonoscopy: Never  PAP:  Bone density: September 2016: Osteoporosis  Lipid panel:  No Known Allergies  Current Outpatient Prescriptions  Medication Sig Dispense Refill  . anastrozole (ARIMIDEX) 1 MG tablet Take 1 tablet (1 mg total) by mouth daily. 90 tablet 3  . Cholecalciferol (CVS D3) 5000 UNITS capsule Take 5,000 Units by mouth every other day.     . Cyanocobalamin (VITAMIN B-12 PO) Take 1 tablet by mouth daily.     Marland Kitchen donepezil (ARICEPT) 10 MG tablet Take 1 tablets daily. 90 tablet 3  . mirtazapine (REMERON) 15 MG tablet Take 1 tablet (15 mg total) by mouth at bedtime. 90 tablet 4  . vitamin A 10000 UNIT capsule Take 10,000  Units by mouth every morning.      No current facility-administered medications for this visit.     OBJECTIVE: Elderly white woman In no acute distress Vitals:   12/11/15 1530  BP: (!) 120/42  Pulse: 77  Resp: 18  Temp: 98.7 F (37.1 C)     Body mass index is 17.66 kg/m.    ECOG FS:1 - Symptomatic but completely ambulatory  Sclerae unicteric, pupils round and equal Oropharynx clear, slightly dry No cervical or supraclavicular adenopathy Lungs no rales or rhonchi, no wheezes appreciated Heart regular rate and rhythm Abd soft, nontender, positive bowel sounds MSK kyphosis but no focal spinal tenderness, no upper extremity lymphedema Neuro: nonfocal, well oriented, appropriate affect Breasts: The right breast is status post mastectomy, with no evidence of disease recurrence. The right axilla is benign. The left breast is unremarkable.   LAB RESULTS:  CMP     Component Value Date/Time   NA 141 12/11/2015 1429   K 3.9 12/11/2015 1429   CL 103 12/19/2013 0425   CO2 28 12/11/2015 1429   GLUCOSE 192 (H) 12/11/2015 1429   BUN 14.1 12/11/2015 1429   CREATININE 1.0 12/11/2015 1429   CALCIUM 9.2 12/11/2015 1429   PROT 6.4 12/11/2015 1429   ALBUMIN 3.4 (L) 12/11/2015 1429   AST 26 12/11/2015 1429   ALT 22 12/11/2015 1429   ALKPHOS 67 12/11/2015 1429   BILITOT 0.90 12/11/2015 1429   GFRNONAA 79 (L) 12/19/2013 0425   GFRAA >90 12/19/2013 0425    I No results found for: SPEP  Lab Results  Component Value Date   WBC 8.0 12/11/2015   NEUTROABS 5.7 12/11/2015   HGB 13.9 12/11/2015   HCT 40.8 12/11/2015   MCV 97.4 12/11/2015   PLT 214 12/11/2015      Chemistry      Component Value Date/Time   NA 141 12/11/2015 1429   K 3.9 12/11/2015 1429   CL 103 12/19/2013 0425   CO2 28 12/11/2015 1429   BUN 14.1 12/11/2015 1429   CREATININE 1.0 12/11/2015 1429      Component Value Date/Time   CALCIUM 9.2 12/11/2015 1429   ALKPHOS 67 12/11/2015 1429   AST 26 12/11/2015 1429     ALT 22 12/11/2015 1429   BILITOT 0.90 12/11/2015 1429       No results found for: LABCA2  No components found for: LABCA125  No results for input(s): INR in the last 168 hours.  Urinalysis    Component Value Date/Time   COLORURINE YELLOW 10/05/2013 1158   APPEARANCEUR Cloudy (  A) 10/05/2013 1158   LABSPEC 1.010 10/05/2013 1158   PHURINE 8.5 (A) 10/05/2013 1158   GLUCOSEU NEGATIVE 10/05/2013 1158   HGBUR NEGATIVE 10/05/2013 1158   BILIRUBINUR neg 01/22/2014 1203   KETONESUR NEGATIVE 10/05/2013 1158   PROTEINUR neg 01/22/2014 1203   UROBILINOGEN 0.2 01/22/2014 1203   UROBILINOGEN 0.2 10/05/2013 1158   NITRITE positive 01/22/2014 1203   NITRITE NEGATIVE 10/05/2013 1158   LEUKOCYTESUR small (1+) 01/22/2014 1203    STUDIES: Repeat mammography due September 2017  ASSESSMENT: 82 y.o. BRCA negative [place] woman status post right breast upper outer quadrant biopsy 11/26/2013 for a clinical T2 N0, stage IIA invasive ductal carcinoma, grade 2, with micropapillary features, triple positive, with an MIB-1 of 31%  (1) right simple mastectomy 12/18/2013 showed a pT2 pNX, stage II invasive ductal carcinoma, grade 2, with negative margins.  (2) 6 mm right lower lobe nodule noted on CT scans 11/19/2013  (a) continuing tobacco abuse-- transitioning to e-cigarettes?  (b) emphysema  (c)  Repeat chest CT 06/27/2014 stable  (3) progressive weight loss   (4) started anastrozole 01/03/2014  (5) started trastuzumab 02/05/2014; completed one year, last dose 02/27/2016  (a) echo  02/27/2015 shows an EF of 60-65%   (6) tobacco abuse disorder: the patient has been strongly advised to discontinue smoking  (7) osteoporosis- Bone density 11/25/2014 showed a T score of -2.8  PLAN: Haeli is now 2 years out from definitive surgery for her breast cancer with no evidence of disease recurrence. This is favorable.  She is tolerating the anastrozole well and the plan will be to continue that  for a total of 5 years.  We have discussed his alendronate before, but it never obtained dental clearance.  We again discussed smoking cessation. To me this is the most important problem she has. At this point she does not see a way of even cutting back on her smoking.  I'm hoping the Remeron she is on in the evening will help her sleep and appetite in addition to perhaps help her depression.  She was behind on her mammogram. I have gone ahead and placed the order. She will see me next year after next years mammography. Chauncey Cruel, MD   12/11/2015 6:52 PM

## 2015-12-12 ENCOUNTER — Ambulatory Visit: Payer: Commercial Managed Care - HMO | Admitting: Neurology

## 2015-12-14 ENCOUNTER — Encounter: Payer: Self-pay | Admitting: Family Medicine

## 2015-12-15 ENCOUNTER — Other Ambulatory Visit: Payer: Self-pay | Admitting: Oncology

## 2015-12-15 DIAGNOSIS — Z1231 Encounter for screening mammogram for malignant neoplasm of breast: Secondary | ICD-10-CM

## 2015-12-17 ENCOUNTER — Ambulatory Visit
Admission: RE | Admit: 2015-12-17 | Discharge: 2015-12-17 | Disposition: A | Discharge status: Home / Self Care | Payer: Commercial Managed Care - HMO | Source: Ambulatory Visit | Attending: Neurology | Admitting: Neurology

## 2015-12-17 DIAGNOSIS — R140 Abdominal distension (gaseous): Secondary | ICD-10-CM | POA: Diagnosis not present

## 2015-12-17 DIAGNOSIS — F0391 Unspecified dementia with behavioral disturbance: Secondary | ICD-10-CM

## 2015-12-17 DIAGNOSIS — F03918 Unspecified dementia, unspecified severity, with other behavioral disturbance: Secondary | ICD-10-CM

## 2015-12-17 DIAGNOSIS — F423 Hoarding disorder: Secondary | ICD-10-CM

## 2015-12-17 MED ORDER — GADOBENATE DIMEGLUMINE 529 MG/ML IV SOLN
10.0000 mL | Freq: Once | INTRAVENOUS | Status: AC | PRN
  Administered 2015-12-17: 10 mL via INTRAVENOUS

## 2015-12-18 ENCOUNTER — Telehealth: Payer: Self-pay | Admitting: Neurology

## 2015-12-18 NOTE — Telephone Encounter (Signed)
MRI brain reviewed with patient's son which looks relatively stable as compared to 2015. There has been some mild progression of small vessel disease and atrophy.  Donika K. Posey Pronto, DO

## 2016-02-27 ENCOUNTER — Other Ambulatory Visit: Payer: Self-pay | Admitting: Nurse Practitioner

## 2016-03-05 ENCOUNTER — Telehealth: Payer: Self-pay | Admitting: Neurology

## 2016-03-05 ENCOUNTER — Encounter: Payer: Self-pay | Admitting: Neurology

## 2016-03-05 NOTE — Telephone Encounter (Signed)
I spoke with Gabrielle [name] and she is requesting a letter stating that her mother should not work and the reason why.  She would like for it to be addressed to " To Whom It May Concern".  Please advise.

## 2016-03-05 NOTE — Telephone Encounter (Signed)
Please inform [name] that due to patient privacy, we do not disclose patient health information to anyone other than those you are designated to obtain release of information.  If her employer would like any specific details of her medical health, Ms. Peller would need to sign a release for them to receive this.  Because the employer did not request the letter and we do not have consent from Ms. Iannello, the letter is addressed directly to her.  Satsuki Zillmer K. Posey Pronto, DO

## 2016-03-05 NOTE — Telephone Encounter (Signed)
PT's daughter [name] called and wanted to talk to you about a couple things concerning her mom/Gabrielle [name] CB# [phone]

## 2016-03-23 NOTE — Telephone Encounter (Signed)
Gabrielle [name] notified that letter has been mailed to Gabrielle [name], [address]  [place], [place] [address]

## 2016-03-23 NOTE — Telephone Encounter (Signed)
I informed Gabrielle [name] about the patient privacy and the letter.  She said that her brother has POA and they are both listed as people we can talk to about patient.  She really needs letter addressed to "to whom it may concern" because if the letter is addressed to her mother, she will throw it away.  Please advise.

## 2016-03-23 NOTE — Telephone Encounter (Signed)
Letter written. Ok to send to son who is POA.  Donika K. Posey Pronto, DO

## 2016-05-28 ENCOUNTER — Ambulatory Visit: Payer: Commercial Managed Care - HMO | Admitting: Neurology

## 2016-05-31 ENCOUNTER — Ambulatory Visit (INDEPENDENT_AMBULATORY_CARE_PROVIDER_SITE_OTHER): Payer: Medicare HMO | Admitting: Neurology

## 2016-05-31 ENCOUNTER — Encounter: Payer: Self-pay | Admitting: Neurology

## 2016-05-31 VITALS — BP 100/70 | HR 90 | Ht 66.0 in | Wt 100.5 lb

## 2016-05-31 DIAGNOSIS — R4681 Obsessive-compulsive behavior: Secondary | ICD-10-CM | POA: Diagnosis not present

## 2016-05-31 DIAGNOSIS — F01518 Vascular dementia, unspecified severity, with other behavioral disturbance: Secondary | ICD-10-CM

## 2016-05-31 DIAGNOSIS — F423 Hoarding disorder: Secondary | ICD-10-CM

## 2016-05-31 DIAGNOSIS — F0151 Vascular dementia with behavioral disturbance: Secondary | ICD-10-CM | POA: Diagnosis not present

## 2016-05-31 NOTE — Patient Instructions (Signed)
Please see psychiatry for your obsessive compulsive behavior and hoarding. Return to clinic in 6 months

## 2016-05-31 NOTE — Progress Notes (Signed)
Follow-up Visit   Date: 05/31/16    Gabrielle [name] MRN: [id number] DOB: [birth year]   Interim History: Gabrielle [name] is a 83 y.o. right-handed Caucasian female with history of tobacco use returning to the clinic for follow-up of vascular dementia.  The patient was accompanied to the clinic by son, Lanny Hurst, who also provides collateral information.    History of present illness: Since January 2015, her son noticed changes in her mother's cognitive ability. She has been having problems with short-term memory and not has "sharp" as she used to be. She is highly functioning at baseline and works part-time at [employer] and has not noticed any problems at work. Problems are mostly with short-term memory such as remembering names, dates, appointments and she has started to write things down more. Her son and daughter have noticed the changes more than patient. Hobbies include reading, working, and lately she has been organizing her ex-husbands home since he passed away last year. They had been divorced for 20+ years.   She is driving without any difficulty. She denies getting lost or being involved in any car accidents. She cooks for herself and eats 2-3 meals per day. Her son says that her response is slower, such as when talking in conversation. Her mood is good. Sleep is fair, averaging 6-7 hours per night. No bizarre or inappropriate behavior changes.   She has not had regular medical care since 2000.  Of note, she has lost 50lb unintentionally in the past 58-month. Appetite remains good.   She was diagnosed with breast cancer in September 2015 and underwent mastectomy and chemotherapy.  During the same time, she reported having greater difficulty with learning new tasks and short-term memory.  She has difficulty maintaining her mail, e-mails, lost her checkbook in her mail, and daughter says her home is in disarray.  Since her last visit, her daughter contacted our office with concerns that  patient was ordering $1200 of online magazine which is atypical for her.  Neuropsychology testing showed evidence of vascular dementia (mild).  Despite recommendation to stop working by her son and daughter, patient continue to file taxes and works for [employer] as well as drive locally.  UPDATE 09/25/2015:  She is here with her son and daughter-in-law.  She was last seen in May 2016.  Reviewing the notes from her PCP indicate that patient did not follow-up here due to my recommendations that she stop working and driving based on her cognitive impairment.  Her son states that her home situation has become worse over the past year, especially with respect to her finances.  She has three active fraud investigations regarding her checking account, [employer] visa card, and [employer] checking account.  Her computer has been hacked and has multiple viruses because she opens emails from people that she does not know.  They are concerned that she is also taking phone calls and agreeing to certain transactions, unknowingly by scam artists.    She continues to live alone and family has noticed that she is hoarding mail and not able to keep up with cleanliness of her home.  She feels the need to open every article of mail that is delivered and has signed up for numerous newsletters and promotions.  Despite my recommendations, she continued to work this year and drive. Family is concerned about whether she takes her medications and eats three meals a day.  They are also worried about her fall risk because she has so much  mail all over her home, including the floor. She does not allow her children to help discard unwanted mail or clean up.   Her son has tried very hard to take control of her finances and confiscated her computer.  However, she still has her phone and ipad.  Patient does not seem to be acknowledge that there is any problem.  Mood is good.  UPDATE 11/26/2015:  She presents for follow-up.  Her son (POA) has taken  greater responsibility over her mail and finances which has greatly alleviated problems with fraud.  Her neuropsychological testing, interestingly, did not demonstrate cognitive deficits which is inconsistent with her clinical exam and symptoms.  There is anxiety and behavior changes, which family and patient endorse.  She continues to receive 45 calls/day from unknown numbers, some which she answers.   UPDATE 05/31/2016:  She continues to live alone and son states that he is concerned about her hoarding and OCD tendencies. She is now collecting coins and paper money.  On one occasion, he found her saving 1 cent stamps off envelopes by soaking them in water to save them.  He shows pictures of her home with clutter and papers everywhere, barely even any place to sit.   She did not see psychiatry as per my last recommendations, as much of her issues are behavorial.  He finances and mail are being managed by her son.  She still continues to work for [employer], despite family and me urging her to stop.  She has lost 9lb since her last visit and endorses good appetite.    Medications:  Current Outpatient Prescriptions on File Prior to Visit  Medication Sig Dispense Refill  . anastrozole (ARIMIDEX) 1 MG tablet Take 1 tablet (1 mg total) by mouth daily. 90 tablet 3  . Cholecalciferol ([employer] D3) 5000 UNITS capsule Take 5,000 Units by mouth every other day.     . Cyanocobalamin (VITAMIN B-12 PO) Take 1 tablet by mouth daily.     Marland Kitchen donepezil (ARICEPT) 10 MG tablet Take 1 tablets daily. 90 tablet 3  . mirtazapine (REMERON) 15 MG tablet Take 1 tablet (15 mg total) by mouth at bedtime. 90 tablet 4  . vitamin A 10000 UNIT capsule Take 10,000 Units by mouth every morning.      No current facility-administered medications on file prior to visit.     Allergies: No Known Allergies   Review of Systems:  CONSTITUTIONAL: No fevers, chills, night sweats, +weight loss.   EYES: No visual changes or eye pain ENT: No  hearing changes.  No history of nose bleeds.   RESPIRATORY: No cough, wheezing and shortness of breath.   CARDIOVASCULAR: Negative for chest pain, and palpitations.   GI: Negative for abdominal discomfort, blood in stools or black stools.  No recent change in bowel habits.   GU:  No history of incontinence.   MUSCLOSKELETAL: No history of joint pain or swelling.  No myalgias.   SKIN: Negative for lesions, rash, and itching.   ENDOCRINE: Negative for cold or heat intolerance, polydipsia or goiter.   PSYCH:  No  depression or anxiety symptoms.   NEURO: As Above.   Vital Signs:  BP 100/70   Pulse 90   Ht _0  (1.676 m)   Wt 100 lb 8 oz (45.6 kg)   SpO2 95%   BMI 16.22 kg/m   Neurological Exam:  MENTAL STATUS:   [facility] Cognitive Assessment  09/25/2015 07/25/2014 12/13/2013 10/08/2013  Visuospatial/ Executive (0/5) 4 3  4 2  Naming (0/3) _0 Attention: Read list of digits (0/2) _1 Attention: Read list of letters (0/1) _2 Attention: Serial 7 subtraction starting at 100 (0/3) _3 Language: Repeat phrase (0/2) _4 Language : Fluency (0/1) 0 0 0 0  Abstraction (0/2) _5 Delayed Recall (0/5) _6 Orientation (0/6) _7 Total _8 Adjusted Score (based on education) _9 Awake, flat affect, oriented to person, place, date, year.  Poor spontaneous speech  Poor eye contact.   Impaired judgement.   CRANIAL NERVES: Pupils equal round and reactive to light.   Face is symmetric.   MOTOR:  Motor strength is 5-/5 in all extremities. Tone is normal.    COORDINATION/GAIT:    Stooped posture, gait narrow based and stable.   Data: Labs 11/26/2015:  RPR NR, vitamin E 1.0, TSH 1.6, vitamin B1 13, vitamin B12 >1500, ESR 13  MRI brain 01/22/2014: No acute infarct. Remote medial right thalamic infarct which was partially hemorrhagic with presence of blood breakdown products. Remote tiny inferior cerebellar infarct. Mild to moderate small  vessel disease type changes. No intracranial mass or bony destructive lesion to suggest the presence of intracranial metastatic disease. Global atrophy without hydrocephalus.  Right vertebral artery and right posterior inferior cerebellar artery may be occluded.  MRI brain wwo contrast 12/17/2015: 1.  No acute intracranial abnormality. 2. Advanced chronic small vessel disease, with progression in both thalami since 2015. 3. Chronic occlusion of the distal right vertebral artery again suspected.  [facility] 2016:  Neuropsychology testing showed evidence of vascular dementia (mild) Neuropsychology testing August 2017:  Anxiety disorder, cognitive disorder deferred   IMPRESSION: Ms. Gabrys is 83 year old female with returning with dementia with behavior changes. Interestingly, her neuropsychological testing from August 2017 did not show evidence of cognitive disorder.  She has a lot of obsessive compulsive and hoarding behavior, but refuses to see psychiatry.  There is a difficult situation as patient does not cooperate with my recommendations.  Her son has taken over her finances and mail, which has helped manage her home condition, but cleanliness is still a problem.  Overall, son does not feel that her memory and behavior has got worse, but moreso that nothing has changed or improved.  We discussed obtaining CSF testing to look for encephalitis, but decided to hold on this unless there is acute worsening in her behavior.  In the meantime, continue aspirin 95m and aricept 169mdaily.  Return to clinic in 6 months   The duration of this appointment visit was 30 minutes of face-to-face time with the patient.  Greater than 50% of this time was spent in counseling, explanation of diagnosis, planning of further management, and coordination of care.   Thank you for allowing me to participate in patient's care.  If I can answer any additional questions, I would be  pleased to do so.    Sincerely,    Donika K. PaPosey ProntoDO

## 2016-07-27 ENCOUNTER — Telehealth: Payer: Self-pay | Admitting: Family Medicine

## 2016-07-27 NOTE — Telephone Encounter (Signed)
Patient's daughter would like to talk to physician privately before patient goes in room. Patient's daughter and neurologist feels patient needs to no longer drive. There are additional things she needs to discuss.

## 2016-07-27 NOTE — Telephone Encounter (Signed)
Please make certain she is in an appropriate time slot to allow this type of visit.

## 2016-07-28 NOTE — Telephone Encounter (Signed)
She is scheduled for a 1:00PM visit.

## 2016-07-30 ENCOUNTER — Ambulatory Visit: Payer: Medicare HMO | Admitting: Family Medicine

## 2016-08-06 ENCOUNTER — Ambulatory Visit (INDEPENDENT_AMBULATORY_CARE_PROVIDER_SITE_OTHER): Payer: Medicare HMO | Admitting: Family Medicine

## 2016-08-06 ENCOUNTER — Other Ambulatory Visit: Payer: Self-pay | Admitting: Oncology

## 2016-08-06 ENCOUNTER — Encounter: Payer: Self-pay | Admitting: Family Medicine

## 2016-08-06 VITALS — BP 111/70 | HR 101 | Temp 97.9°F | Resp 16 | Wt 100.0 lb

## 2016-08-06 DIAGNOSIS — F0150 Vascular dementia without behavioral disturbance: Secondary | ICD-10-CM | POA: Diagnosis not present

## 2016-08-06 DIAGNOSIS — Z1211 Encounter for screening for malignant neoplasm of colon: Secondary | ICD-10-CM | POA: Diagnosis not present

## 2016-08-06 DIAGNOSIS — R634 Abnormal weight loss: Secondary | ICD-10-CM

## 2016-08-06 DIAGNOSIS — Z853 Personal history of malignant neoplasm of breast: Secondary | ICD-10-CM

## 2016-08-06 DIAGNOSIS — F17210 Nicotine dependence, cigarettes, uncomplicated: Secondary | ICD-10-CM | POA: Diagnosis not present

## 2016-08-06 DIAGNOSIS — Z9011 Acquired absence of right breast and nipple: Secondary | ICD-10-CM

## 2016-08-06 DIAGNOSIS — F17200 Nicotine dependence, unspecified, uncomplicated: Secondary | ICD-10-CM

## 2016-08-06 DIAGNOSIS — F423 Hoarding disorder: Secondary | ICD-10-CM | POA: Diagnosis not present

## 2016-08-06 DIAGNOSIS — R7309 Other abnormal glucose: Secondary | ICD-10-CM | POA: Diagnosis not present

## 2016-08-06 DIAGNOSIS — R64 Cachexia: Secondary | ICD-10-CM

## 2016-08-06 LAB — T3, FREE: T3 FREE: 3.4 pg/mL (ref 2.3–4.2)

## 2016-08-06 LAB — T4, FREE: FREE T4: 1.1 ng/dL (ref 0.8–1.8)

## 2016-08-06 LAB — TSH: TSH: 1.76 mIU/L

## 2016-08-06 MED ORDER — MIRTAZAPINE 7.5 MG PO TABS: 7.5000 mg | ORAL_TABLET | Freq: Every day | ORAL | 3 refills | Status: DC

## 2016-08-06 NOTE — Progress Notes (Signed)
Patient ID: Gabrielle [name], female   DOB: 05-Jan-1931, 83 y.o.   MRN: [id number]    Gabrielle ASCHENBRENNER , 1930/04/26, 83 y.o., female MRN: [id number]  CC: vascular dementia Patient Care Team    Relationship Specialty Notifications Start End  Ma Hillock, DO PCP - General Family Medicine  08/01/15   Excell Seltzer, MD Consulting Physician General Surgery  11/29/13   Magrinat, Virgie Dad, MD Consulting Physician Oncology  11/29/13   Arloa Koh, MD Consulting Physician Radiation Oncology  11/29/13   Jarome Matin, MD Consulting Physician Dermatology  06/11/15   Brand Males, MD Consulting Physician Pulmonary Disease  06/11/15   Alda Berthold, DO Consulting Physician Neurology  08/01/15     Subjective: Vascular dementia:  Patient presents with her son and daughter today. Patient family had requested to meet a separate room prior to patient's appointment which was completed today. They have many concerns over their mother's safety, and are not certain on how to pursue with potential placement for her. They feel her memory loss and issue, that does not seem to be worsening but is not improving. They feel that she will be unwilling to go to a an assisted living facility, but they do not feel she has great decision-making capacity. They report she is still attempting to get on the computer, and make stock market trades. She is occasionally still preparing peoples taxes. She is still driving, despite being discouraged to do so by her neurologist. She continues to lose weight, but she states she is eating 3 meals a day. Her family does not think she is eating routinely. She stopped taking the ensure supplement which was encouraged to times a day because she did not feel was helpful. She stopped taking the Remeron nightly, because she felt it made her too tired. The family has had to stop the mail delivery to the home, because she was writing checks to take charities in the amounts of thousands of dollars. She  is hoarding all magazines and mail. They state that there is only a path to walk within the home which is a safety risk. They report she goes out to smoke in the garage, that that has stacks of paper mail and gasoline. Patient is a breast cancer survivor patient 40, currently on anastrozole. She follows routinely with oncology, last appointment October 2017. A mammogram was ordered at that time, patient reports they did not call her to schedule. Her family states she does not pick up the phone and routinely forgets appointments. She has never had a colonoscopy. She denies bowel changes melena or hematochezia. She has a history of lung nodule with COPD/emphysema. She is still smoking. She is established with pulmonology Dr. Chase Caller. She has not followed up since October 2015, and reportedly had a low suspicious 4 mm right lower lung nodule at that time. Repeat CT chest April 2016 did not show evidence of metastatic disease. Patient denies any shortness of breath, cough or hemoptysis. She denies any systemic symptoms such as abdominal pain, early satiety, GI disturbances or night sweats. Patient has refused psychiatry referrals by neurology. They also reports she does not take medications that she is prescribed. MRI 12/17/2015 resulted with evidence of advanced chronic small vessel disease with progression in both thalami since 2015.  Prior note:  Patient presents with her son Gabrielle [name]) today to discuss her vascular dementia. Patient's son and daughter have concerns about her mother's safety, and progression in her dementia. This is the first time  I have met the patient. Patient states she is following with her oncologist for her breast cancer history. She is due to follow back up with him after her mammogram in October, and then a yearly basis. He would like to start Reclast for her osteoporosis but favor waiting until after she has her dental work completed. Patient seemed to be confused on when her  follow-up should be and at the start of the medication for her osteoporosis. She currently continues to take anastrozole. Patient reports she has not followed up with neurologist as planned because she was fearful she was going to have her license taken away and she did not like the they had recommended she stop working. She was also seen by neuropsychologist which to the imaging study felt that she had vascular dementia and has suffered from 2 prior strokes. Family made concerning bringing her in today is to attempt to discuss with her and encourage her to be agreeable to see the neurologist again. Patient states that she is no longer taking the Aricept because she didn't see any difference anyway. Patient does admit to being more forgetful, but does not see this as a issue. She feels that she is normal forgetfulness secondary to her age. Her son reports that the house is "filling up with mail ". He states that his mother has a hard time throwing away mail until it is open, however she is not opening female in a timely manner. Bills are becoming late and the house is becoming cluttered with an open mail. He states that there is mail it has been there for at least a year. He feels it's even potentially a fire hazard. The patient states that she does not want throughout the mail because be something important there. She gets anxious when the idea of cleaning out the home and mail is mentioned. Patient's family states that she gets mail and other family members house because she signed up for [employer] different mailing list. Her son also reports that there are some financial concerns. He feels that she has to open with her and antral portfolio with people that she should not be sharing that information with. He also states that she gives lots of her money to charity, and finds that the charity are nonexistent/false charities. Another example is she recently went on a road trip with her brother to [facility], of which she is not able to tell any major details. Patient spends theses locked out of the majority of her online accounts because she changes the passwords and then forgets them. She states she wrote them down but lost the note book. She denies any falls in the home, but had recently tripped over pipe in the yard at [employer]. She reports she does cook for herself, and her son doesn't place any safety concerns surrounding her well-appearing her own meals. She lives alone and unassisted, and does not have routine family check ins. Her son is fearful she is going to get in the car and either get in an accident, or get lost and nobody will know for a few days.  She is losing weight, however her weight has been stable over the last year. Her BMI is 17.6. Her son states that she usually just eats one item as a meal, i.e. mashed potatoes. She does not take any nutrition supplements.   No Known Allergies Social History  Substance Use Topics  . Smoking status: Current Every Day Smoker  Packs/day: 0.50    Years: 40.00    Types: Cigarettes  . Smokeless tobacco: Never Used  . Alcohol use 1.2 oz/week    2 Glasses of wine per week     Comment: wine a couple times weekly   Past Medical History:  Diagnosis Date  . Anxiety   . Breast cancer of upper-outer quadrant of right female breast ([place]) 10/2013   Anastrazole since 12/2013--plan is to take this for 5 yrs.  No evidence of dz recurrence as of 11/2015 oncol f/u.  Marland Kitchen Cataract   . COPD (chronic obstructive pulmonary disease) ([place])   . Macular degeneration   . Osteoporosis 10/2014   T-score -2.8  . Pneumonia    hx of   . Retina hole   . Shortness of breath    increased activity   . Tobacco dependence    Past Surgical History:  Procedure Laterality Date  . ABDOMINAL HYSTERECTOMY  1980   (ovaries intact)  . APPENDECTOMY    . CATARACT EXTRACTION Bilateral   . RETINAL LASER PROCEDURE    . TOTAL MASTECTOMY Right 12/18/2013    Procedure: RIGHT TOTAL MASTECTOMY;  Surgeon: Excell Seltzer, MD;  Location: [facility];  Service: General;  Laterality: Right;   Family History  Problem Relation Age of Onset  . Heart attack Mother        Deceased  . Cancer Mother        fallopian tube cancer in 21s; deceased 37  . Throat cancer Father        Deceased 85; smoker  . Prostate cancer Brother 29       Currently 84  . Other Brother        Deceased, hemorhhage of pancreas  . Leukemia Maternal Aunt   . Breast cancer Paternal Aunt        age at diagnosis unknown  . Throat cancer Paternal Uncle        Deceased 62s; smoker  . Leukemia Paternal Grandmother    Allergies as of 08/06/2016   No Known Allergies     Medication List       Accurate as of 08/06/16  3:56 PM. Always use your most recent med list.          anastrozole 1 MG tablet Commonly known as:  ARIMIDEX Take 1 tablet (1 mg total) by mouth daily.   [employer] D3 5000 units capsule Generic drug:  Cholecalciferol Take 5,000 Units by mouth every other day.   donepezil 10 MG tablet Commonly known as:  ARICEPT Take 1 tablets daily.   mirtazapine 15 MG tablet Commonly known as:  REMERON Take 1 tablet (15 mg total) by mouth at bedtime.   vitamin A 10000 UNIT capsule Take 10,000 Units by mouth every morning.   VITAMIN B-12 PO Take 1 tablet by mouth daily.      ROS: Negative, with the exception of above mentioned in HPI  Objective:  BP 111/70 (BP Location: Left Arm, Patient Position: Sitting, Cuff Size: Normal)   Pulse (!) 101   Temp 97.9 F (36.6 C) (Oral)   Resp 16   Wt 100 lb (45.4 kg)   SpO2 96%   BMI 16.14 kg/m  Body mass index is 16.14 kg/m. Gen: Afebrile. No acute distress.  HENT: AT. [place]. Bilateral TM visualized and normal in appearance. MMM.  Eyes:Pupils Equal Round Reactive to light, Extraocular movements intact,  Conjunctiva without redness, discharge or icterus. Neck/lymp/endocrine: Supple,No lymphadenopathy, no thyromegaly CV: RRR, no  edema, +  2/4 P posterior tibialis pulses Chest: CTAB, no wheeze or crackles Abd: Soft. NTND. BS present.  Neuro:  Normal gait. PERLA. EOMi. Alert. Oriented. Cranial nerves II through XII intact.  Psych: Normal affect, dress and demeanor. Normal speech. Normal thought content and judgment. Answers questions appropriately.  Assessment/Plan: MARRA FRAGA is a 83 y.o. female present for  OV for  Vascular dementia, without behavioral disturbance Weight loss - Family desired to be met in separate room to discuss their mother before group meeting/appt was held, and this was completed.  - Weight loss has been an ongoing battle since prior to breast cancer diagnosis in 2015. However it has continued and patient has lost an additional 10 pounds this year. Discussed caloric intake, provided family with resources to calculate daily caloric needs. Pt encouraged to eat 3 meals a day and ensure or boost x3 between meals (not substitute meal). Can not r/o dementia vs malignancy, or other cause for weight loss.  - Social work: Will refer for Home health social work, she can not leave the home safely, she has been told not to drive by neuropsych.  Family needs guidance on mental capacity/competency/ALF placement. Will try [facility] resources first, for social work and psychiatry (nurse). Pt has refused psychiatry referral by neurology. She demonstrates hoarding behavior, can not take care of her finances any longer because of giving away money to fake charities. Family has had to stop the mail delivery to her home completely.  - Remeron: restart Remeron at 7.5 mg dose QHS . Hopefully this will help decrease sedation side effects she reported as reason she stopped medication.  - Cologuard: discussed with pt and her family in great detail today the idea of testing with cologuard. Pt reports no prior colonoscopy. She continues to lose weight, but reports good intake. It is understood she may not want to seek treatment if  positive, but it would provide some potential answers on weight loss. She currently does not want to see GI.  - Consider PULM referral again, if no other answers on weight loss with labs, mammogram, cologuard. H/O pul nodule, COPD and current smoker.  - Mammogram: pt was to have mammogram scheduled, Oncology ordered, she states she was not called to schedule (dementia pt). Mammogram was set up for her while she was in our office today. - discussed family moving forward with legal competency/capacity.  - *Family is concerned for pt safety* - Please call family contacts to make appts, pt will not answer her phone.  - Discussed in depth with pts family in a separate room from patient  today the potentially  frustrating path of social work, Scientist, product/process development, family pursuing proving legally pt as incompetent and/or not having capacity, open discussion between pt and family about ALF placement options, and last resort APS if needed (which they can initiate).  - TSH - T3, free - T4, free Colon cancer screening - Cologuard Elevated glucose level - 192 at heme/onc office (not fasting) - Hemoglobin A1c Follow up dependent on labs and Home health findings.    Close follow-up 4-6 weeks.  Greater than 40 minutes spent with patient and family, >50% of time spent face to face counseling and/or coordinating care.    Electronically Signed by: Howard Pouch, DO [facility]- OR   electronically signed by:  Howard Pouch, DO  [place]

## 2016-08-06 NOTE — Patient Instructions (Addendum)
1. cologuard will be sent to the home, make certain to follow directions nad send it back. 2. We will get the mammogram Dr. Jana Hakim ordered for you set up.  3. We will call with labs once resulted.  4. I am ordering a Education officer, museum to visit you.  5. Restart the Remeron. I called in a lower dose that you may tolerate better.  6. Please try to eat 3 meals a day and continue ensure or boost (3x a day). Try the calorie calculator to find out how many calories you should take in, ina day and make sure to get them daily.

## 2016-08-07 LAB — HEMOGLOBIN A1C
HEMOGLOBIN A1C: 5.6 % (ref <5.7)
Mean Plasma Glucose: 114 mg/dL

## 2016-08-09 ENCOUNTER — Telehealth: Payer: Self-pay | Admitting: Family Medicine

## 2016-08-09 DIAGNOSIS — F423 Hoarding disorder: Secondary | ICD-10-CM | POA: Insufficient documentation

## 2016-08-09 NOTE — Telephone Encounter (Signed)
Detailed message left on daughter, [name], Voice mail. Okay per DPR.

## 2016-08-09 NOTE — Telephone Encounter (Signed)
Please call family. I do want to see her back in 4-6 weeks on her weight loss. By that time we should have cologuard and mammogram resulted.  She will need to make certain taking in adequate calories, which was discussed. If no cause is established would favor referral back to pulmonology as well to be complete.

## 2016-08-09 NOTE — Telephone Encounter (Signed)
Please call pt or family: - her labs are normal. I have placed the referral we discussed so they will be hearing from them to schedule.

## 2016-08-09 NOTE — Telephone Encounter (Signed)
Message left with daughter, [name], to return call. Okay per [facility]

## 2016-08-11 NOTE — Telephone Encounter (Signed)
Patient's daughter, [name], was notified and verbalized understanding.  Appointment scheduled.

## 2016-08-16 ENCOUNTER — Telehealth: Payer: Self-pay | Admitting: Family Medicine

## 2016-08-16 ENCOUNTER — Other Ambulatory Visit: Payer: Self-pay | Admitting: *Deleted

## 2016-08-16 ENCOUNTER — Encounter: Payer: Self-pay | Admitting: *Deleted

## 2016-08-16 NOTE — Telephone Encounter (Signed)
Unable to verify [facility] is moving forward with assistance with this pt. If unable to provide assistance we need to move forward with Home health referral. Please advise.  [name]

## 2016-08-16 NOTE — Patient Outreach (Signed)
[facility]) Care Management  08/16/2016  Gabrielle [name] [birth year] [id number]   CSW made an initial attempt to try and contact patient today to perform phone assessment, as well as assess and assist with social needs and services, without success.  A HIPAA compliant message was left for patient on voicemail.  CSW is currently awaiting a return call. CSW will make a second outreach attempt within the next week, if CSW does not receive a return call from patient in the meantime. [name], BSW, MSW, LCSW  Licensed Education officer, environmental Health System  Mailing [place] [address] [place], [place] [address] Physical Address-[address], [place], [place] [address] Toll Free Main # [phone] Fax # [phone] Cell # [phone]  Office # [phone] [email or web address]

## 2016-08-17 DIAGNOSIS — Z1211 Encounter for screening for malignant neoplasm of colon: Secondary | ICD-10-CM | POA: Diagnosis not present

## 2016-08-17 DIAGNOSIS — Z1212 Encounter for screening for malignant neoplasm of rectum: Secondary | ICD-10-CM | POA: Diagnosis not present

## 2016-08-18 LAB — COLOGUARD: COLOGUARD: POSITIVE

## 2016-08-19 ENCOUNTER — Encounter: Payer: Self-pay | Admitting: *Deleted

## 2016-08-19 ENCOUNTER — Other Ambulatory Visit: Payer: Self-pay | Admitting: *Deleted

## 2016-08-19 NOTE — Patient Outreach (Signed)
Request received from [name], LCSW to mail patient personal care resources.  Information mailed on today 08/19/16.

## 2016-08-19 NOTE — Patient Outreach (Signed)
[facility]) Care Management  08/19/2016  TENNESSEE HANLON 10/20/1930 [id number]   CSW was able to make initial contact with patient and patient's son, Jonella Redditt today to perform the phone assessment, as well as assess and assist with social work needs and services for patient.  CSW introduced self, explained role and types of services provided through [place] [facility] ([facility]).  CSW further explained to patient that CSW works with patient's Primary Care Physician, [name], also with [facility]. CSW then explained the reason for the call, indicating that [name] thought that patient would benefit from social work services and resources to assist with hoarding issues in the home, as well as arranging possible long-term care placement arrangements in an assisted living facility.  CSW obtained two HIPAA compliant identifiers from patient, which included patient's name and date of birth. Patient, Mr. Deutschman and patient's daughter, [name] have all agreed to meet with CSW for an initial home visit on Monday, July 23rd at 11:00am, the next time Mrs. Aline Brochure plans to be in town from [place], [place].  Mr. Osorto reported that patient's current home situation is not urgent and can wait until all parties are available to meet.  CSW spoke with patient and Mr. Marcoux at [employer] about patient being at the point where she requires 24 hour care and supervision, either at home with in-home care services or in an assisted living facility for long-term care. Mr. Balboni is agreeable with both recommendations; however, patient does not see the condition of her home as being a problem.  Patient has major hoarding issues, in addition to the fact that she currently suffers from Vascular Dementia. [name], BSW, MSW, LCSW  Licensed Education officer, environmental Health System  Mailing [place]  [address], [place], [place] [address] Physical Address-[address], [place], [place] [address] Toll Free Main # [phone] Fax # [phone] Cell # [phone]  Office # [phone] [email or web address]

## 2016-08-23 ENCOUNTER — Other Ambulatory Visit: Payer: Medicare HMO | Admitting: *Deleted

## 2016-08-23 ENCOUNTER — Encounter: Payer: Self-pay | Admitting: *Deleted

## 2016-08-23 ENCOUNTER — Ambulatory Visit
Admission: RE | Admit: 2016-08-23 | Discharge: 2016-08-23 | Disposition: A | Discharge status: Home / Self Care | Payer: Medicare HMO | Source: Ambulatory Visit | Attending: Oncology | Admitting: Oncology

## 2016-08-23 ENCOUNTER — Telehealth: Payer: Self-pay | Admitting: Family Medicine

## 2016-08-23 DIAGNOSIS — Z853 Personal history of malignant neoplasm of breast: Secondary | ICD-10-CM

## 2016-08-23 DIAGNOSIS — Z1231 Encounter for screening mammogram for malignant neoplasm of breast: Secondary | ICD-10-CM | POA: Diagnosis not present

## 2016-08-23 DIAGNOSIS — Z9011 Acquired absence of right breast and nipple: Secondary | ICD-10-CM

## 2016-08-23 NOTE — Telephone Encounter (Addendum)
Attempted to call Pts contact Gabrielle [name] today. A Voicemail was left for him to call back to the office concerning Gabrielle [name]'s Cologuard results.  - please give him the following message. Her cologuard is positive, which can indicate presence of colorectal cancer or advanced stage colon polyp.  - during the decision concerning completing this test was secondary to [name] has had a great deal of weight loss and no records of colorectal cancer screening (colonoscopy). At this point I would recommend referral to GI to recommend further work up if desired.  ------------------------------------------ Gabrielle [name] returned the call and above info was given to him. He is going to discuss with his family and [name]. They will  let us know how they would like to proceed. Dr. Hilarie Fredrickson (GI) is a family member of theirs.   Electronically Signed by: Howard Pouch, DO [facility]- OR.

## 2016-08-24 ENCOUNTER — Telehealth: Payer: Self-pay | Admitting: Family Medicine

## 2016-08-24 NOTE — Telephone Encounter (Signed)
Cecilie Lowers with [facility] calling to confirm pcp received positive Cologuard result that was faxed to office on 08/23/16.  If pcp did not receive results or has any questions about results please call back.  If results were received, no need to call back.  When calling back please call [phone], select option 1, then option 2 for provider support.  Provide patient's Cologuard Order ID # [id number].

## 2016-08-24 NOTE — Telephone Encounter (Signed)
Test results received and already addressed by Dr Raoul Pitch.

## 2016-08-25 ENCOUNTER — Telehealth: Payer: Self-pay | Admitting: Family Medicine

## 2016-08-25 DIAGNOSIS — Z1211 Encounter for screening for malignant neoplasm of colon: Secondary | ICD-10-CM

## 2016-08-25 DIAGNOSIS — R195 Other fecal abnormalities: Secondary | ICD-10-CM

## 2016-08-25 DIAGNOSIS — R634 Abnormal weight loss: Secondary | ICD-10-CM

## 2016-08-25 NOTE — Telephone Encounter (Signed)
Referral placed today to requested gastroenterologist Dr. Edison Nasuti.

## 2016-08-25 NOTE — Telephone Encounter (Signed)
Patient is requesting Dr. Edison Nasuti with [facility] for her colonoscopy.

## 2016-08-26 ENCOUNTER — Encounter: Payer: Self-pay | Admitting: Gastroenterology

## 2016-09-07 ENCOUNTER — Encounter: Payer: Self-pay | Admitting: Gastroenterology

## 2016-09-07 ENCOUNTER — Ambulatory Visit (INDEPENDENT_AMBULATORY_CARE_PROVIDER_SITE_OTHER): Payer: Medicare HMO | Admitting: Gastroenterology

## 2016-09-07 ENCOUNTER — Other Ambulatory Visit (INDEPENDENT_AMBULATORY_CARE_PROVIDER_SITE_OTHER): Payer: Medicare HMO

## 2016-09-07 VITALS — BP 100/62 | HR 80 | Ht 63.5 in | Wt 102.4 lb

## 2016-09-07 DIAGNOSIS — R195 Other fecal abnormalities: Secondary | ICD-10-CM | POA: Diagnosis not present

## 2016-09-07 DIAGNOSIS — R634 Abnormal weight loss: Secondary | ICD-10-CM

## 2016-09-07 DIAGNOSIS — Z853 Personal history of malignant neoplasm of breast: Secondary | ICD-10-CM

## 2016-09-07 LAB — COMPREHENSIVE METABOLIC PANEL
ALT: 20 U/L (ref 0–35)
AST: 20 U/L (ref 0–37)
Albumin: 3.7 g/dL (ref 3.5–5.2)
Alkaline Phosphatase: 81 U/L (ref 39–117)
BUN: 25 mg/dL — AB (ref 6–23)
CHLORIDE: 101 meq/L (ref 96–112)
CO2: 25 meq/L (ref 19–32)
CREATININE: 0.85 mg/dL (ref 0.40–1.20)
Calcium: 9.3 mg/dL (ref 8.4–10.5)
GFR: 67.72 mL/min (ref >60.00)
GLUCOSE: 124 mg/dL — AB (ref 70–99)
POTASSIUM: 4.3 meq/L (ref 3.5–5.1)
SODIUM: 135 meq/L (ref 135–145)
Total Bilirubin: 1 mg/dL (ref 0.2–1.2)
Total Protein: 7.5 g/dL (ref 6.0–8.3)

## 2016-09-07 LAB — CBC WITH DIFFERENTIAL/PLATELET
BASOS PCT: 0.2 % (ref 0.0–3.0)
Basophils Absolute: 0 10*3/uL (ref 0.0–0.1)
EOS ABS: 0 10*3/uL (ref 0.0–0.7)
EOS PCT: 0.2 % (ref 0.0–5.0)
HCT: 41.5 % (ref 36.0–46.0)
Hemoglobin: 13.9 g/dL (ref 12.0–15.0)
LYMPHS ABS: 0.8 10*3/uL (ref 0.7–4.0)
Lymphocytes Relative: 6.6 % — ABNORMAL LOW (ref 12.0–46.0)
MCHC: 33.5 g/dL (ref 30.0–36.0)
MCV: 98 fl (ref 78.0–100.0)
MONO ABS: 1.4 10*3/uL — AB (ref 0.1–1.0)
Monocytes Relative: 10.8 % (ref 3.0–12.0)
NEUTROS PCT: 82.2 % — AB (ref 43.0–77.0)
Neutro Abs: 10.6 10*3/uL — ABNORMAL HIGH (ref 1.4–7.7)
PLATELETS: 236 10*3/uL (ref 150.0–400.0)
RBC: 4.24 Mil/uL (ref 3.87–5.11)
RDW: 13.5 % (ref 11.5–15.5)
WBC: 12.9 10*3/uL — AB (ref 4.0–10.5)

## 2016-09-07 NOTE — Progress Notes (Signed)
HPI :  83 y/o female with a history of chronic tobacco use, COPD, history of CVA with vascular dementia  (diagnosed around 2015 or so) history of breast cancer in 2015, seen for a new patient visit for positive Cologuard and weight loss.    She had a positive Cologuard on 08/18/2016. No prior colon cancer screening.  She reports she used to weight 155-165 lbs which was her baseline a few years ago. She currently weighs around 100 or so. She was diagnosed with breast cancer in 2015, she weighed around 115 lbs around that time. In October 2017 she weighed around 113 lbs. Down to 100 or so now. She has a good appetite and states she eats well. She usually eats around 3 meals per day, with snacks including ice cream on most nights. No nausea or vomiting. No abdominal pains. No diarrhea or constipation at baseline, some foods can precipitate loose stools. No blood in the stools. No postprandial pains, no difficulty swallowing. She reports she drank a lot of Ensure last summer, used it routinely, but did not help with weight gain.   No prior colonoscopy. No FH of colon cancer. She is on Remeron at present time.   She is accompanied by her daughter [name] today, who additionally reports a worsening of chronic cough lately and slightly more dyspnea than usual with exertion. The patient has COPD but no oxygen requirement. No anemia. No history of NSAID use.  Echocardiogram 01/2015 - EF 65-70%   Past Medical History:  Diagnosis Date  . Anxiety   . Breast cancer of upper-outer quadrant of right female breast ([employer]) 10/2013   Anastrazole since 12/2013--plan is to take this for 5 yrs.  No evidence of dz recurrence as of 11/2015 oncol f/u.  Marland Kitchen Cataract   . COPD (chronic obstructive pulmonary disease) ([place])   . Macular degeneration   . Osteoporosis 10/2014   T-score -2.8  . Pneumonia    hx of   . Retina hole   . Shortness of breath    increased activity   . Skin cancer   . Stroke ([place])   . Tobacco  dependence      Past Surgical History:  Procedure Laterality Date  . ABDOMINAL HYSTERECTOMY  1980   (ovaries intact)  . APPENDECTOMY    . BREAST BIOPSY Right   . CATARACT EXTRACTION Bilateral   . MASTECTOMY Right   . MOHS SURGERY     Dr. Sarajane Jews  . PARS PLANA VITRECTOMY W/ REPAIR OF MACULAR HOLE Right   . RETINAL LASER PROCEDURE    . TOTAL MASTECTOMY Right 12/18/2013   Procedure: RIGHT TOTAL MASTECTOMY;  Surgeon: Excell Seltzer, MD;  Location: [facility];  Service: General;  Laterality: Right;   Family History  Problem Relation Age of Onset  . Heart attack Mother        Deceased  . Cancer Mother        fallopian tube cancer in 68s; deceased 7  . Throat cancer Father        Deceased 37; smoker  . Diabetes Brother   . Prostate cancer Brother 47       Currently 69  . Other Brother        Deceased, hemorhhage of pancreas  . Leukemia Maternal Aunt   . Breast cancer Paternal Aunt        age at diagnosis unknown  . Throat cancer Paternal Uncle        Deceased 69s; smoker  .  Leukemia Paternal Grandmother    Social History  Substance Use Topics  . Smoking status: Current Every Day Smoker    Packs/day: 0.50    Years: 40.00    Types: Cigarettes  . Smokeless tobacco: Never Used  . Alcohol use 1.2 oz/week    2 Glasses of wine per week     Comment: wine a couple times weekly   Current Outpatient Prescriptions  Medication Sig Dispense Refill  . anastrozole (ARIMIDEX) 1 MG tablet Take 1 tablet (1 mg total) by mouth daily. 90 tablet 3  . aspirin 81 MG tablet Take 81 mg by mouth daily.    . Cholecalciferol ([employer] D3) 5000 UNITS capsule Take 5,000 Units by mouth every other day.     . Cyanocobalamin (VITAMIN B-12 PO) Take 1 tablet by mouth daily.     Marland Kitchen donepezil (ARICEPT) 10 MG tablet Take 1 tablets daily. 90 tablet 3  . mirtazapine (REMERON) 7.5 MG tablet Take 1 tablet (7.5 mg total) by mouth at bedtime. 90 tablet 3  . Multiple Vitamins-Minerals (PRESERVISION AREDS 2 PO) Take 1  tablet by mouth daily.     No current facility-administered medications for this visit.    No Known Allergies   Review of Systems: All systems reviewed and negative except where noted in HPI.    Mm Screening Breast Tomo Uni L  Result Date: 08/23/2016 CLINICAL DATA:  Screening. EXAM: 2D DIGITAL SCREENING UNILATERAL LEFT MAMMOGRAM WITH CAD AND ADJUNCT TOMO COMPARISON:  Previous exam(s). ACR Breast Density Category c: The breast tissue is heterogeneously dense, which may obscure small masses. FINDINGS: The patient has had a right mastectomy. There are no findings suspicious for malignancy. Images were processed with CAD. IMPRESSION: No mammographic evidence of malignancy. A result letter of this screening mammogram will be mailed directly to the patient. RECOMMENDATION: Screening mammogram in one year.  (Code:SM-L-12M) BI-RADS CATEGORY  1: Negative. Electronically Signed   By: Trude Mcburney M.D.   On: 08/23/2016 14:15   Lab Results  Component Value Date   WBC 12.9 (H) 09/07/2016   HGB 13.9 09/07/2016   HCT 41.5 09/07/2016   MCV 98.0 09/07/2016   PLT 236.0 09/07/2016    CBC Latest Ref Rng & Units 09/07/2016 12/11/2015 06/11/2015  WBC 4.0 - 10.5 K/uL 12.9(H) 8.0 6.6  Hemoglobin 12.0 - 15.0 g/dL 13.9 13.9 14.3  Hematocrit 36.0 - 46.0 % 41.5 40.8 43.6  Platelets 150.0 - 400.0 K/uL 236.0 214 202   Lab Results  Component Value Date   ALT 20 09/07/2016   AST 20 09/07/2016   ALKPHOS 81 09/07/2016   BILITOT 1.0 09/07/2016    Lab Results  Component Value Date   CREATININE 0.85 09/07/2016   BUN 25 (H) 09/07/2016   NA 135 09/07/2016   K 4.3 09/07/2016   CL 101 09/07/2016   CO2 25 09/07/2016      Physical Exam: BP 100/62 (BP Location: Left Arm, Patient Position: Sitting, Cuff Size: Normal)   Pulse 80   Ht 5' 3.5" (1.613 m)   Wt 102 lb 6 oz (46.4 kg)   BMI 17.85 kg/m  Constitutional: Pleasant, thin appearing female in no acute distress. HEENT: Normocephalic and atraumatic.  Conjunctivae are normal. No scleral icterus. Neck supple.  Cardiovascular: Normal rate, regular rhythm.  Pulmonary/chest: Effort normal,and breath sounds slightly diminished bilaterally. No wheezing, rales or rhonchi. Abdominal: Soft, nondistended,scaphoid abdomen,  nontender.. There are no masses palpable.  Extremities: (+) 1 bilateral edema of the ankles Lymphadenopathy: No cervical  adenopathy noted. Neurological: Alert and oriented to person place and time. Skin: Skin is warm and dry. No rashes noted. Psychiatric: Normal mood and affect. Behavior is normal.   ASSESSMENT AND PLAN: 83 y/o female with history of breast cancer, CVA, and chronic tobacco use, presenting with what appears to be a slowly progressive unintentional weight loss over the past few years. She appears to be eating well, and has a good appetite. No other systemic symptoms other than mild worsening of chronic cough / dyspnea. Her recent workup has included a positive Cologuard. TSH normal.  Unclear etiology for her progressive weight loss at this time, but given her personal history of breast cancer and tobacco use, I think imaging with CT C/A/P is warranted to exclude malignancy. I discussed this with the patient and her daughter who agreed with imaging at this time to initially evaluate this issue. In the interim recommend she utilize supplemental protein / high calorie shakes to help prevent further weight loss, especially given she appears to tolerate them and have a good appetite.   Otherwise we discussed the positive Cologuard and what the differential is for this finding. Given her age and tobacco use history, she very likely has colon polyps, however, while she has no anemia or abdominal symptoms concerning for malignancy, this needs to be ruled out will be assessed for with CT imaging initially as above. If no obvious evidence of a colonic malignancy on CT, we discussed whether or not she would want a colonoscopy with  the goals of that exam to rule out large polyps and to detect current malignancy and prevent future malignancy. She wants to avoid a colonoscopy if at all possible which is  Reasonable but will await CT results. She will think about it a bit further and I will discuss CT findings with them once available.   In the interim we obtained some basic labs today - CBC shows mild leukocytosis and LFTs normal. Will await CT findings of her chest in regards to rule out lung malignancy and assessing for COPD changes, but I think she should follow up with pulmonary for repeat PFTs and reassessment of pulmonary function, as if this is progressing it could contribute to weight loss as well.    They were in agreement with the plan as outlined. All questions answered.  Beckett Ridge Cellar, MD [facility] Pager [phone]  CC: Ma Hillock, DO

## 2016-09-07 NOTE — Patient Instructions (Signed)
If you are age 81 or older, your body mass index should be between 23-30. Your Body mass index is 17.85 kg/m. If this is out of the aforementioned range listed, please consider follow up with your Primary Care Provider.  If you are age 48 or younger, your body mass index should be between 19-25. Your Body mass index is 17.85 kg/m. If this is out of the aformentioned range listed, please consider follow up with your Primary Care Provider.   Your physician has requested that you go to the basement for lab work before leaving today.  You have been scheduled for a CT scan of the abdomen and pelvis at [facility] ([address]---this is in the same building as Press [facility]).   You are scheduled on Wednesday, July 18th at 1:00pm. You should arrive 15 minutes prior to your appointment time for registration. Please follow the written instructions below on the day of your exam:  WARNING: IF YOU ARE ALLERGIC TO IODINE/X-RAY DYE, PLEASE NOTIFY RADIOLOGY IMMEDIATELY AT [id number]! YOU WILL BE GIVEN A 13 HOUR PREMEDICATION PREP.  1) Do not eat anything after 9:00am (4 hours prior to your test) You may have liquids. 2) You have been given 2 bottles of oral contrast to drink. The solution may taste               better if refrigerated, but do NOT add ice or any other liquid to this solution. Shake             well before drinking.    Drink 1 bottle of contrast @ 11:00am (2 hours prior to your exam)  Drink 1 bottle of contrast @ 12:00pm (1 hour prior to your exam)  You may take any medications as prescribed with a small amount of water except for the following: Metformin, Glucophage, Glucovance, Avandamet, Riomet, Fortamet, Actoplus Met, Janumet, Glumetza or Metaglip. The above medications must be held the day of the exam AND 48 hours after the exam.  The purpose of you drinking the oral contrast is to aid in the visualization of your intestinal tract. The contrast solution may cause  some diarrhea. Before your exam is started, you will be given a small amount of fluid to drink. Depending on your individual set of symptoms, you may also receive an intravenous injection of x-ray contrast/dye. Plan on being at [facility] for 30 minutes or longer, depending on the type of exam you are having performed.  This test typically takes 30-45 minutes to complete.  If you have any questions regarding your exam or if you need to reschedule, you may call the CT department at [phone] between the hours of 8:00 am and 5:00 pm, Monday-Friday.  ________________________________________________________________________

## 2016-09-09 ENCOUNTER — Telehealth: Payer: Self-pay | Admitting: Gastroenterology

## 2016-09-09 NOTE — Telephone Encounter (Signed)
Pt daughter has been advised and will call if any fever or further symptoms develop.    Notes recorded by Alphonzo Dublin, LPN on 09/17/9468 at 9:62 PM EDT Left message for pts daughter [name] to return call ------  Notes recorded by Armbruster, Renelda Loma, MD on 09/08/2016 at 8:22 AM EDT Caryl Pina can you please relay to this patient or her daughter [name] that her labs are stable other than mild elevation in WBC. She denied any fevers when I saw her yesterday. If she develops any symptoms concerning for an infection she needs to contact us. Otherwise will await CT scan scheduled for next week. Thanks

## 2016-09-15 ENCOUNTER — Other Ambulatory Visit: Payer: Self-pay | Admitting: Gastroenterology

## 2016-09-15 ENCOUNTER — Ambulatory Visit (INDEPENDENT_AMBULATORY_CARE_PROVIDER_SITE_OTHER)
Admission: RE | Admit: 2016-09-15 | Discharge: 2016-09-15 | Disposition: A | Discharge status: Home / Self Care | Payer: Medicare HMO | Source: Ambulatory Visit | Attending: Gastroenterology | Admitting: Gastroenterology

## 2016-09-15 DIAGNOSIS — R634 Abnormal weight loss: Secondary | ICD-10-CM | POA: Diagnosis not present

## 2016-09-15 DIAGNOSIS — J189 Pneumonia, unspecified organism: Secondary | ICD-10-CM

## 2016-09-15 DIAGNOSIS — R531 Weakness: Secondary | ICD-10-CM | POA: Diagnosis not present

## 2016-09-15 DIAGNOSIS — J439 Emphysema, unspecified: Secondary | ICD-10-CM | POA: Diagnosis not present

## 2016-09-15 MED ORDER — IOPAMIDOL (ISOVUE-300) INJECTION 61%
100.0000 mL | Freq: Once | INTRAVENOUS | Status: AC | PRN
  Administered 2016-09-15: 100 mL via INTRAVENOUS

## 2016-09-15 MED ORDER — AMOXICILLIN-POT CLAVULANATE 875-125 MG PO TABS: 1.0000 | ORAL_TABLET | Freq: Two times a day (BID) | ORAL | 0 refills | Status: DC

## 2016-09-16 ENCOUNTER — Other Ambulatory Visit: Payer: Self-pay

## 2016-09-16 DIAGNOSIS — R9389 Abnormal findings on diagnostic imaging of other specified body structures: Secondary | ICD-10-CM

## 2016-09-17 ENCOUNTER — Telehealth: Payer: Self-pay | Admitting: Internal Medicine

## 2016-09-17 NOTE — Telephone Encounter (Signed)
Called and spoke with pts daughter and she stated that she will try and get the pt here on 7/26 at 945.  Nothing further is needed.

## 2016-09-17 NOTE — Telephone Encounter (Signed)
This patient last saw Dr. Chase Caller on 12/07/2013.  There is an urgent referral in the Epic workque from Nadine Counts, MD requesting an appointment for this patient.  I have not contacted the patient's daughter to schedule appointment as Dr. Golden Pop first 30 min opening is not until 11/16/2016 on the schedule.  Referral notes read as follows....  Patient is established with Dr. Chase Caller, needs to be seen ASAP, pulmonary aspiration, pulmonary hypertension. Recent CT chest. Please contact daughter, [name], requesting next Monday appointment as she lives in [place].

## 2016-09-20 ENCOUNTER — Other Ambulatory Visit: Payer: Self-pay | Admitting: *Deleted

## 2016-09-20 ENCOUNTER — Encounter: Payer: Self-pay | Admitting: Family Medicine

## 2016-09-20 ENCOUNTER — Ambulatory Visit (INDEPENDENT_AMBULATORY_CARE_PROVIDER_SITE_OTHER): Payer: Medicare HMO | Admitting: Family Medicine

## 2016-09-20 VITALS — BP 128/74 | HR 78 | Temp 98.0°F | Resp 20 | Ht 64.0 in | Wt 99.2 lb

## 2016-09-20 DIAGNOSIS — Z0289 Encounter for other administrative examinations: Secondary | ICD-10-CM

## 2016-09-20 DIAGNOSIS — Z111 Encounter for screening for respiratory tuberculosis: Secondary | ICD-10-CM

## 2016-09-20 DIAGNOSIS — R634 Abnormal weight loss: Secondary | ICD-10-CM | POA: Diagnosis not present

## 2016-09-20 DIAGNOSIS — R911 Solitary pulmonary nodule: Secondary | ICD-10-CM | POA: Diagnosis not present

## 2016-09-20 DIAGNOSIS — F17210 Nicotine dependence, cigarettes, uncomplicated: Secondary | ICD-10-CM

## 2016-09-20 DIAGNOSIS — F0150 Vascular dementia without behavioral disturbance: Secondary | ICD-10-CM

## 2016-09-20 DIAGNOSIS — J449 Chronic obstructive pulmonary disease, unspecified: Secondary | ICD-10-CM

## 2016-09-20 DIAGNOSIS — R64 Cachexia: Secondary | ICD-10-CM | POA: Diagnosis not present

## 2016-09-20 NOTE — Patient Instructions (Signed)
Will fill out FL2 form for assistance/placement.  PPD today, have reading 48-72 hours.   Consider  meal and wheels as alternative to meal prep until placement.  Continue Remeron at 7.5 mg Qd.

## 2016-09-20 NOTE — Progress Notes (Signed)
Patient ID: Gabrielle [name], female   DOB: 12/22/1930, 83 y.o.   MRN: [id number]    TAQUISHA PHUNG , Mar 18, 1930, 83 y.o., female MRN: [id number]  CC: vascular dementia Patient Care Team    Relationship Specialty Notifications Start End  Ma Hillock, DO PCP - General Family Medicine  08/01/15   Excell Seltzer, MD Consulting Physician General Surgery  11/29/13   Magrinat, Virgie Dad, MD Consulting Physician Oncology  11/29/13   Arloa Koh, MD Consulting Physician Radiation Oncology  11/29/13   Jarome Matin, MD Consulting Physician Dermatology  06/11/15   Brand Males, MD Consulting Physician Pulmonary Disease  06/11/15   Alda Berthold, DO Consulting Physician Neurology  08/01/15   [place] [place] [facility]  Admissions 08/16/16     Subjective: Vascular dementia/weight loss:  Patient and her family have now met with the social worker, he has recommended assisted living facility long-term. Patient is agreeable to consider this, and they have brought an FL2  form with them today to be completed and will need a TB test. They're planning to place her in a assisted living facility. Patient reports she has been taking the Remeron 7.5 mg, and it is not making her sleepy throughout the night. She again is about the same weight from her last visit 100 pounds to 99.25 pounds. She is not drinking the ensure as recommended. Her daughter states she eats really good when there is food in front of her, she just does not think that her mother goes and gets food to eat on her own. She also had a positive: Guard, and spoke with GI. They proceeded with a CT abdomen and chest, which was reassuring from the abdomen perspective, but resulted with potential aspiration pneumonia which they are treating with Augmentin. CT chest also showed evidence of potential pulmonary hypertension, borderline enlarged mediastinal lymph nodes largest 15 mm, emphysema Patient states she feels fine,  she's had a cough for years. She has follow-up with her pulmonologist scheduled this week.    Prior note: Patient presents with her son and daughter today. Patient family had requested to meet a separate room prior to patient's appointment which was completed today. They have many concerns over their mother's safety, and are not certain on how to pursue with potential placement for her. They feel her memory loss and issue, that does not seem to be worsening but is not improving. They feel that she will be unwilling to go to a an assisted living facility, but they do not feel she has great decision-making capacity. They report she is still attempting to get on the computer, and make stock market trades. She is occasionally still preparing peoples taxes. She is still driving, despite being discouraged to do so by her neurologist. She continues to lose weight, but she states she is eating 3 meals a day. Her family does not think she is eating routinely. She stopped taking the ensure supplement which was encouraged to times a day because she did not feel was helpful. She stopped taking the Remeron nightly, because she felt it made her too tired. The family has had to stop the mail delivery to the home, because she was writing checks to take charities in the amounts of thousands of dollars. She is hoarding all magazines and mail. They state that there is only a path to walk within the home which is a safety risk. They report she goes out to smoke in the garage,  that that has stacks of paper mail and gasoline. Patient is a breast cancer survivor patient 62, currently on anastrozole. She follows routinely with oncology, last appointment October 2017. A mammogram was ordered at that time, patient reports they did not call her to schedule. Her family states she does not pick up the phone and routinely forgets appointments. She has never had a colonoscopy. She denies bowel changes melena or hematochezia. She has a  history of lung nodule with COPD/emphysema. She is still smoking. She is established with pulmonology Dr. Chase Caller. She has not followed up since October 2015, and reportedly had a low suspicious 4 mm right lower lung nodule at that time. Repeat CT chest April 2016 did not show evidence of metastatic disease. Patient denies any shortness of breath, cough or hemoptysis. She denies any systemic symptoms such as abdominal pain, early satiety, GI disturbances or night sweats. Patient has refused psychiatry referrals by neurology. They also reports she does not take medications that she is prescribed. MRI 12/17/2015 resulted with evidence of advanced chronic small vessel disease with progression in both thalami since 2015.  Prior note:  Patient presents with her son [name]) today to discuss her vascular dementia. Patient's son and daughter have concerns about her mother's safety, and progression in her dementia. This is the first time I have met the patient. Patient states she is following with her oncologist for her breast cancer history. She is due to follow back up with him after her mammogram in October, and then a yearly basis. He would like to start Reclast for her osteoporosis but favor waiting until after she has her dental work completed. Patient seemed to be confused on when her follow-up should be and at the start of the medication for her osteoporosis. She currently continues to take anastrozole. Patient reports she has not followed up with neurologist as planned because she was fearful she was going to have her license taken away and she did not like the they had recommended she stop working. She was also seen by neuropsychologist which to the imaging study felt that she had vascular dementia and has suffered from 2 prior strokes. Family made concerning bringing her in today is to attempt to discuss with her and encourage her to be agreeable to see the neurologist again. Patient states that she is no  longer taking the Aricept because she didn't see any difference anyway. Patient does admit to being more forgetful, but does not see this as a issue. She feels that she is normal forgetfulness secondary to her age. Her son reports that the house is "filling up with mail ". He states that his mother has a hard time throwing away mail until it is open, however she is not opening female in a timely manner. Bills are becoming late and the house is becoming cluttered with an open mail. He states that there is mail it has been there for at least a year. He feels it's even potentially a fire hazard. The patient states that she does not want throughout the mail because be something important there. She gets anxious when the idea of cleaning out the home and mail is mentioned. Patient's family states that she gets mail and other family members house because she signed up for symmetry different mailing list. Her son also reports that there are some financial concerns. He feels that she has to open with her and antral portfolio with people that she should not be sharing that information with. He  also states that she gives lots of her money to charity, and finds that the charity are nonexistent/false charities. Another example is she recently went on a road trip with her brother to [facility], of which she is not able to tell any major details. Patient spends theses locked out of the majority of her online accounts because she changes the passwords and then forgets them. She states she wrote them down but lost the note book. She denies any falls in the home, but had recently tripped over pipe in the yard at [employer]. She reports she does cook for herself, and her son doesn't place any safety concerns surrounding her well-appearing her own meals. She lives alone and unassisted, and does not have routine family check ins. Her son is fearful she is going to get in the car and either get in an accident, or get  lost and nobody will know for a few days.  She is losing weight, however her weight has been stable over the last year. Her BMI is 17.6. Her son states that she usually just eats one item as a meal, i.e. mashed potatoes. She does not take any nutrition supplements.   No Known Allergies Social History  Substance Use Topics  . Smoking status: Current Every Day Smoker    Packs/day: 0.50    Years: 40.00    Types: Cigarettes  . Smokeless tobacco: Never Used  . Alcohol use 1.2 oz/week    2 Glasses of wine per week     Comment: wine a couple times weekly   Past Medical History:  Diagnosis Date  . Anxiety   . Breast cancer of upper-outer quadrant of right female breast ([place]) 10/2013   Anastrazole since 12/2013--plan is to take this for 5 yrs.  No evidence of dz recurrence as of 11/2015 oncol f/u.  Marland Kitchen Cataract   . COPD (chronic obstructive pulmonary disease) ([place])   . Macular degeneration   . Osteoporosis 10/2014   T-score -2.8  . Pneumonia    hx of   . Retina hole   . Skin cancer   . Stroke ([place])   . Tobacco dependence    Past Surgical History:  Procedure Laterality Date  . ABDOMINAL HYSTERECTOMY  1980   (ovaries intact)  . APPENDECTOMY    . BREAST BIOPSY Right   . CATARACT EXTRACTION Bilateral   . MASTECTOMY Right   . MOHS SURGERY     Dr. Sarajane Jews  . PARS PLANA VITRECTOMY W/ REPAIR OF MACULAR HOLE Right   . RETINAL LASER PROCEDURE    . TOTAL MASTECTOMY Right 12/18/2013   Procedure: RIGHT TOTAL MASTECTOMY;  Surgeon: Excell Seltzer, MD;  Location: [facility];  Service: General;  Laterality: Right;   Family History  Problem Relation Age of Onset  . Heart attack Mother        Deceased  . Cancer Mother        fallopian tube cancer in 91s; deceased 90  . Throat cancer Father        Deceased 25; smoker  . Diabetes Brother   . Prostate cancer Brother 62       Currently 71  . Other Brother        Deceased, hemorhhage of pancreas  . Leukemia Maternal Aunt   . Breast cancer  Paternal Aunt        age at diagnosis unknown  . Throat cancer Paternal Uncle        Deceased 22s; smoker  .  Leukemia Paternal Grandmother    Allergies as of 09/20/2016   No Known Allergies     Medication List       Accurate as of 09/20/16 11:59 PM. Always use your most recent med list.          anastrozole 1 MG tablet Commonly known as:  ARIMIDEX Take 1 tablet (1 mg total) by mouth daily.   aspirin 81 MG tablet Take 81 mg by mouth daily.   [employer] D3 5000 units capsule Generic drug:  Cholecalciferol Take 5,000 Units by mouth every other day.   donepezil 10 MG tablet Commonly known as:  ARICEPT Take 1 tablets daily.   mirtazapine 7.5 MG tablet Commonly known as:  REMERON Take 1 tablet (7.5 mg total) by mouth at bedtime.   OVER THE COUNTER MEDICATION Take 2 tablets by mouth daily. Preservision   PRESERVISION AREDS 2 PO Take 1 tablet by mouth daily.   VITAMIN B-12 PO Take 1 tablet by mouth daily.      ROS: Negative, with the exception of above mentioned in HPI  Objective:  BP 128/74 (BP Location: Right Arm, Patient Position: Sitting, Cuff Size: Small)   Pulse 78   Temp 98 F (36.7 C)   Resp 20   Ht '5\' 4"'  (1.626 m)   Wt 99 lb 4 oz (45 kg)   SpO2 96%   BMI 17.04 kg/m  Body mass index is 17.04 kg/m.  Gen: Afebrile. No acute distress.  HENT: AT. [place]. MMM.  Eyes:Pupils Equal Round Reactive to light, Extraocular movements intact,  Conjunctiva without redness, discharge or icterus. CV: RRR, no edema Chest: CTAB, no wheeze or crackles Abd: Soft. Flat. NTND. BS present. No Masses palpated.  Neuro:  Normal gait. PERLA. EOMi. Alert. Oriented. Psych: Normal affect, dress and demeanor. Normal speech. Normal thought content and judgment.   Assessment/Plan: CATHLINE DOWEN is a 83 y.o. female present for  OV for  Vascular dementia, without behavioral disturbance Weight loss - ongoing battle since prior to breast cancer diagnosis in 2015.  Discussed caloric intake,  provided family with resources to calculate daily caloric needs. Pt encouraged to eat 3 meals a day and ensure or boost x3 between meals (not substitute meal). Can not r/o dementia vs malignancy, or other cause for weight loss.  - Discussed Meals on Wheels - Remeron: Remeron at 7.5 mg dose QHS, she is tolerating the lower dose. Continue. - cologuard positive--> GI completed a CT abdomen and chest, which is reassuring. They are not recommending a colonoscopy unless she is cleared from a pulmonology standpoint giving her CT chest results. - Chest CT positive for possible aspiration pneumonia--> treated--> has referral in to PULM for further evaluation on COPD/h/o lung nodules, questionable pulmonary hypertension - Pt did have mammogram 07/2016   Form completion - Completed FL 2, TB skin test administered today-patient will present within 48-72 hours to have test read. FL tube will be faxed to social work.    Electronically Signed by: Howard Pouch, DO [facility]- OR   electronically signed by:  Howard Pouch, DO  [place]

## 2016-09-20 NOTE — Patient Outreach (Signed)
[facility]) Care Management  09/20/2016  Gabrielle [name] Jun 19, 1930 [id number]   CSW made an attempt to try and contact patient's son, Iola Turri today to remind him of home visit, scheduled for today at 11:00am.  A HIPAA compliant message was left on voicemail.  CSW is currently awaiting a return call for confirmation. [name], BSW, MSW, LCSW  Licensed Education officer, environmental Health System  Mailing [place] [address] [place], [place] [address] Physical Address-[address], [place], [place] [address] Toll Free Main # [phone] Fax # [phone] Cell # [phone]  Office # [phone] [email or web address]

## 2016-09-21 NOTE — Patient Outreach (Signed)
[name]) Care Management  09/21/2016  Gabrielle [name] 03/24/1930 [id number]   CSW was able to meet with patient, patient's son, [name] and patient's daughter, [name] today to perform the initial home visit.  Patient's current home environment is definitely cause for concern, as it is extremely cluttered, messy and each room presents a tripping hazard with all the debris and garbage.  Patient did not appear to be the least but alarmed; however, both her children also expressed a great deal of concern.  Patient is reluctant to allow anyone to clean or clear away anything, reporting that she plans to get better organized in the near future. CSW spoke with patient, [name] and [name] at length about long-term care placement options in an assisted living facility, agreeing to mail [name] a complete listing of all assisted living facilities in [place].  In addition, CSW agreed to outreach to patient's Primary Care Physician, [name] to request a completed and signed FL-2 Form, as well as administration of a TB Skin Test, both requirements for assisted living placement.  CSW is aware that patient has an appointment scheduled with [name] for 1:00pm today. Patient is agreeable to considering placement, but would like some type to think about the various options presented to her today.  CSW voiced understanding and was agreeable to this plan. CSW is aware that [name] is [employer], as well as Medical illustrator, and that he will be handling all of patient's affairs.  CSW agreed to follow-up with patient on Thursday, July 26th with a home visit, to further discuss the various placement options.  Patient has CSW's contact information and has been encouraged to contact CSW directly if questions and/or concerns arise in the meantime. [name], BSW, MSW, LCSW  Licensed Brewing technologist [facility] System  Mailing [place] [address], [place], [place] [address] Physical Address-[address], [place], [place] [address] Toll Free Main # [phone] Fax # [phone] Cell # [phone]  Office # [phone] [email or web address]

## 2016-09-21 NOTE — Patient Outreach (Signed)
Request received from Joanna Saporito, LCSW to mail patient personal care resources.  Information mailed today. 

## 2016-09-22 ENCOUNTER — Encounter: Payer: Self-pay | Admitting: Family Medicine

## 2016-09-22 DIAGNOSIS — J449 Chronic obstructive pulmonary disease, unspecified: Secondary | ICD-10-CM

## 2016-09-22 DIAGNOSIS — C50411 Malignant neoplasm of upper-outer quadrant of right female breast: Secondary | ICD-10-CM

## 2016-09-22 DIAGNOSIS — F0150 Vascular dementia without behavioral disturbance: Secondary | ICD-10-CM

## 2016-09-22 DIAGNOSIS — R911 Solitary pulmonary nodule: Secondary | ICD-10-CM

## 2016-09-22 DIAGNOSIS — R634 Abnormal weight loss: Secondary | ICD-10-CM

## 2016-09-22 DIAGNOSIS — M810 Age-related osteoporosis without current pathological fracture: Secondary | ICD-10-CM

## 2016-09-23 ENCOUNTER — Ambulatory Visit (INDEPENDENT_AMBULATORY_CARE_PROVIDER_SITE_OTHER): Payer: Medicare HMO | Admitting: Internal Medicine

## 2016-09-23 ENCOUNTER — Encounter: Payer: Self-pay | Admitting: Internal Medicine

## 2016-09-23 ENCOUNTER — Telehealth: Payer: Self-pay | Admitting: Internal Medicine

## 2016-09-23 ENCOUNTER — Other Ambulatory Visit: Payer: Self-pay | Admitting: *Deleted

## 2016-09-23 VITALS — BP 104/70 | HR 79 | Ht 64.0 in | Wt 98.6 lb

## 2016-09-23 DIAGNOSIS — J449 Chronic obstructive pulmonary disease, unspecified: Secondary | ICD-10-CM | POA: Diagnosis not present

## 2016-09-23 DIAGNOSIS — J181 Lobar pneumonia, unspecified organism: Secondary | ICD-10-CM

## 2016-09-23 DIAGNOSIS — J189 Pneumonia, unspecified organism: Secondary | ICD-10-CM

## 2016-09-23 LAB — TB SKIN TEST
Induration: 0 mm
TB Skin Test: NEGATIVE

## 2016-09-23 MED ORDER — AMOXICILLIN-POT CLAVULANATE 875-125 MG PO TABS: 1.0000 | ORAL_TABLET | Freq: Two times a day (BID) | ORAL | 0 refills | Status: DC

## 2016-09-23 MED ORDER — TIOTROPIUM BROMIDE MONOHYDRATE 2.5 MCG/ACT IN AERS: 2.0000 | INHALATION_SPRAY | Freq: Every day | RESPIRATORY_TRACT | 5 refills | Status: DC

## 2016-09-23 NOTE — Patient Instructions (Addendum)
ICD-10-CM   1. COPD, moderate ([place]) J44.9   2. Pneumonia of left lower lobe due to infectious organism ([place]) J18.1     COPD  -please restart spiriva respimat; wil help with cough - will discuss repeat PFT at followup  Pneumonia  - likely aspiration or due to poor dental hygienie  - continue augmentin as advised by GI but extend for 4 more days for total 14 days (will send Rx)  -talk to Dr Havery Moros if you could have swallowing difficulty  - definitely see dentist  - do followup CT chest wo contrast in 8 weeks  Smoking   Understand you enjoy it and wont quit  Followup  3 weeks with APP to report progress 8-10  weeks with me Dr Chase Caller but after CT chest

## 2016-09-23 NOTE — Telephone Encounter (Signed)
Plan:      COPD  -please restart spiriva respimat; wil help with cough - will discuss repeat PFT at followup  Pneumonia  - likely aspiration or due to poor dental hygienie  - continue augmentin as advised by GI but extend for 4 more days for total 14 days (will send Rx)  -talk to Dr Havery Moros if you could have swallowing difficulty  - definitely see dentist  - do followup CT chest wo contrast in 8 weeks  Smoking   Understand you enjoy it and wont quit  Followup  3 weeks with APP to report progress 8-10  weeks with me Dr Chase Caller but after CT chest   I have left message stating we have sent extra 4 days of Augmentin and Spiriva Respimat to [facility] on [address]. If any further questions or concerns please call our office.

## 2016-09-23 NOTE — Patient Outreach (Signed)
[facility]) Care Management  09/23/2016  MARONDA CAISON [birth year] [id number]   CSW was able to follow-up with patient today to explain to her that her FL-2 Form has been completed and signed by her Primary Care Physician, [name] and ready to be picked up at Dr. Lucita Lora office.  In addition, [name] has the results of patient's TB Skin test, administered on Monday, July 23rd.  CSW will plan to make copies of patient's results and FL-2 Form, but leave the original for patient's son, and Donna of [place], [place]. Patient admitted that she has not yet had a chance to review the packet of resource information left for her by CSW during the initial home visit.  This packet of information includes a list of private agency sitters, as well as a list of all assisted living facilities in [facility].  Patient encouraged CSW to discuss her healthcare related information directly with Mr. Manwarren.  Patient further reported that Mr. Ulrey is also her Medical illustrator and that he holds all her legal documents.  These documents include all of patient's Stocks, Bond, CD's, [employer], Burial Plots, and [employer]. CSW left a HIPAA compliant message for Mr. Berent, but has not yet received a return call.  CSW agreed to follow-up with patient and Mr. Flaten again in one week, Friday, August 3rd, to check the status of patient's willingness for CSW to pursue bed offers for patient in a long-term care assisted living facility.  Patient voiced understanding and was agreeable to this plan.  Patient and Mr. Fuertes have CSW's contact information and have been encouraged to contact CSW directly if social work needs and/or questions arise in the meantime. [name], BSW, MSW, LCSW  Licensed Education officer, environmental Health System  Mailing [place] [address] [place], [place]  [address] Physical Address-[address], [place], [place] [address] Toll Free Main # [phone] Fax # [phone] Cell # [phone]  Office # [phone] [email or web address]

## 2016-09-23 NOTE — Progress Notes (Signed)
Subjective:     Patient ID: Gabrielle [name], female   DOB: Feb 19, 1931, 83 y.o.   MRN: [id number]  HPI PCP Gabrielle [name], Gabrielle Kell, NP   HPI  IOV 12/07/2013  Chief Complaint  Patient presents with  . Pulmonary Consult    Pt referred by Gabrielle. Cathie [name] for COPD.     83 year old female. 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 Ceiba aunt of GI doc Gabrielle [name]. Gabrielle Gabrielle [name] and daughter Gabrielle [name] had briefed me about patient prior to visit.  She has had 50# weight loss per hx x 12 months (per Gabrielle [name] notes 12/05/13 no documented loss per epic) or so with some general decline in ? Cognition.  She saw cardiology 11/07/13 and cxr showed emphysema. Resulted in CT Chest 11/19/13 that showed  - 1cm Rt breast mass    - subseuqntly seen Gabrielle [name] 12/05/13 :  triple postive breast cancer - await surgery    - per Daughter's email to me   Yesterday .     Mom has invasive ductal breast cancer . Tumor is 2.1-3.0 cm . cancer is grade 2, stage 1B to stage 2. . Proliferation factor 31% . no evidence cancer has spread to lymph nodes . it is estrogen, progesterone and HER2 positive. . Current treatment plan:  mastectomy of right breast, followed by Anastrozole and Herceptin.   Prognosis:  the physicians believe there is a high probability the surgery and medications will cure the cancer.      - 86m RLL nodule  - severe emphysema +  So she has been referred here  She says that leading up to this fall 2015 - absolutely no dyspnea, cough, chest tightness. But now some cough with nasal congestion. In fact, COPD CAT Score below shows mild cough and sputum with very little dyspnea. Fatigue seeems main issue. COPD CAT score is 14 and reflects only mild symptoms burden.   Walk test 185 feet x 3 laps: did not desaturate and denies dysppnea  sPirometry today: fev1 1.5L/74%, Ratio 63  cw  MILDER FORM OF GOLD STAGE 2 COPD   Daughter has sseveral question on email   - addressed in plan   OV 09/23/2016   Chief Complaint   Patient presents with  . Follow-up    Pt last seen in 11/2013. Pt here today for abnormal CT chest. Gabrielle. AHavery Morosadvised pt to f/u back up with pulmonary. Pt c/o DOE, prod cough with clear mucus. Pt denies CP/tightness and f/c/s.     .83year old female. Accompanied by her daughter TOlivia [name] Patient lives alone. Last seen almost 3 years ago for COPD Gold stage II and ongoing smoking and preoperative clearance and unexplained weight loss. Since then she's not followed up. According to the daughter and patient is continued to lose weights several pounds since then. It has accelerated in the last several months. Apparently an extensive workup was done and a CT scan a week ago 09/15/2016 showed left lower lobe pneumonia and pulmonary hypertension therefore they've back here. Patient continues to smoke. She reports ongoing weight loss. Ongoing cough and shortness of breath. She is no longer taking her Spiriva. The cough might be worse according to the daughter, the patient is unsure. Patient continues to live alone and take care of herself. Patient family is trying to get her into an assisted living but she is resisting. Continues to smoke and will not quit. There is no report of dysphagia and apparently GI has cleared up.  Her last visit to the Dentists was over a year ago. Patient denies any aspiration episodes or alcohol consumption.      CAT COPD Symptom & Quality of Life Score (GSK trademark) 0 is no burden. 5 is highest burden 12/07/2013   Never Cough -> Cough all the time 3  No phlegm in chest -> Chest is full of phlegm 4  No chest tightness -> Chest feels very tight 0  No dyspnea for 1 flight stairs/hill -> Very dyspneic for 1 flight of stairs 2  No limitations for ADL at home -> Very limited with ADL at home 0  Confident leaving home -> Not at all confident leaving home 0  Sleep soundly -> Do not sleep soundly because of lung condition 0  Lots of Energy -> No energy at all 5  TOTAL Score  (max 40)  14      has a past medical history of Anxiety; Breast cancer of upper-outer quadrant of right female breast ([place]) (10/2013); Cataract; COPD (chronic obstructive pulmonary disease) ([place]); Macular degeneration; Osteoporosis (10/2014); Pneumonia; Retina hole; Skin cancer; Stroke ([place]); and Tobacco dependence.   reports that she has been smoking Cigarettes.  She has a 30.00 pack-year smoking history. She has never used smokeless tobacco.  Past Surgical History:  Procedure Laterality Date  . ABDOMINAL HYSTERECTOMY  1980   (ovaries intact)  . APPENDECTOMY    . BREAST BIOPSY Right   . CATARACT EXTRACTION Bilateral   . MASTECTOMY Right   . MOHS SURGERY     Gabrielle. Sarajane [name]  . PARS PLANA VITRECTOMY W/ REPAIR OF MACULAR HOLE Right   . RETINAL LASER PROCEDURE    . TOTAL MASTECTOMY Right 12/18/2013   Procedure: RIGHT TOTAL MASTECTOMY;  Surgeon: Gabrielle Seltzer, MD;  Location: [facility];  Service: General;  Laterality: Right;    No Known Allergies  Immunization History  Administered Date(s) Administered  . Influenza,inj,Quad PF,36+ Mos 11/21/2013, 12/19/2014  . PPD Test 09/20/2016  . Pneumococcal Conjugate-13 11/21/2013    Family History  Problem Relation Age of Onset  . Heart attack Mother        Deceased  . Cancer Mother        fallopian tube cancer in 64s; deceased 51  . Throat cancer Father        Deceased 12; smoker  . Diabetes Brother   . Prostate cancer Brother 50       Currently 73  . Other Brother        Deceased, hemorhhage of pancreas  . Leukemia Maternal Aunt   . Breast cancer Paternal Aunt        age at diagnosis unknown  . Throat cancer Paternal Uncle        Deceased 75s; smoker  . Leukemia Paternal Grandmother      Current Outpatient Prescriptions:  .  amoxicillin-clavulanate (AUGMENTIN) 875-125 MG tablet, Take 1 tablet by mouth 2 (two) times daily., Disp: , Rfl:  .  anastrozole (ARIMIDEX) 1 MG tablet, Take 1 tablet (1 mg total) by mouth daily., Disp:  90 tablet, Rfl: 3 .  aspirin 81 MG tablet, Take 81 mg by mouth daily., Disp: , Rfl:  .  Cholecalciferol (CVS D3) 5000 UNITS capsule, Take 5,000 Units by mouth every other day. , Disp: , Rfl:  .  Cyanocobalamin (VITAMIN B-12 PO), Take 1 tablet by mouth daily. , Disp: , Rfl:  .  donepezil (ARICEPT) 10 MG tablet, Take 1 tablets daily., Disp: 90 tablet, Rfl: 3 .  mirtazapine (REMERON) 7.5 MG tablet, Take 1 tablet (7.5 mg total) by mouth at bedtime., Disp: 90 tablet, Rfl: 3 .  Multiple Vitamins-Minerals (PRESERVISION AREDS 2 PO), Take 1 tablet by mouth daily., Disp: , Rfl:    Review of Systems     Objective:   Physical Exam  Constitutional: She is oriented to person, place, and time. No distress.  frail  HENT:  Head: Normocephalic and atraumatic.  Right Ear: External ear normal.  Left Ear: External ear normal.  Mouth/Throat: Oropharynx is clear and moist. No oropharyngeal exudate.  Dentures maxilla Poor dental hygiene lowers  Eyes: Pupils are equal, round, and reactive to light. Conjunctivae and EOM are normal. Right eye exhibits no discharge. Left eye exhibits no discharge. No scleral icterus.  Neck: Normal range of motion. Neck supple. No JVD present. No tracheal deviation present. No thyromegaly present.  Cardiovascular: Normal rate, regular rhythm, normal heart sounds and intact distal pulses.  Exam reveals no gallop and no friction rub.   No murmur heard. Pulmonary/Chest: Effort normal and breath sounds normal. No respiratory distress. She has no wheezes. She has no rales. She exhibits no tenderness.  Kyphotic barell chest Tobacco smell LLL crackles  Abdominal: Soft. Bowel sounds are normal. She exhibits no distension and no mass. There is no tenderness. There is no rebound and no guarding.  Musculoskeletal: Normal range of motion. She exhibits no edema or tenderness.  Lymphadenopathy:    She has no cervical adenopathy.  Neurological: She is alert and oriented to person, place,  and time. She has normal reflexes. No cranial nerve deficit. She exhibits normal muscle tone. Coordination normal.  Skin: Skin is warm and dry. No rash noted. She is not diaphoretic. No erythema. No pallor.  Psychiatric: She has a normal mood and affect. Her behavior is normal. Judgment and thought content normal.  Vitals reviewed.  Vitals:   09/23/16 1032  BP: 104/70  Pulse: 79  SpO2: 92%  Weight: 98 lb 9.6 oz (44.7 kg)  Height: _0  (1.626 m)    Estimated body mass index is 16.92 kg/m as calculated from the following:   Height as of this encounter: _1  (1.626 m).   Weight as of this encounter: 98 lb 9.6 oz (44.7 kg).       Assessment:       ICD-10-CM   1. COPD, moderate ([place]) J44.9   2. Pneumonia of left lower lobe due to infectious organism ([place]) J18.1        Plan:      COPD  -please restart spiriva respimat; wil help with cough - will discuss repeat PFT at followup  Pneumonia  - likely aspiration or due to poor dental hygienie  - continue augmentin as advised by GI but extend for 4 more days for total 14 days (will send Rx)  -talk to Gabrielle Havery Moros if you could have swallowing difficulty  - definitely see dentist  - do followup CT chest wo contrast in 8 weeks  Smoking   Understand you enjoy it and wont quit  Followup  3 weeks with APP to report progress 8-10  weeks with me Gabrielle Chase Caller but after CT chest      > 50% of this > 40 min visit spent in face to face counseling or/and coordination of care    Gabrielle. Brand Males, M.D., [facility].C.P Pulmonary and Critical Care Medicine Staff Physician [place] [facility] Pager: [phone], If no answer or between  15:00h - 7:00h: call [phone]  09/23/2016 11:02 AM

## 2016-09-27 ENCOUNTER — Telehealth: Payer: Self-pay | Admitting: Internal Medicine

## 2016-09-27 NOTE — Telephone Encounter (Signed)
lmtcb X1 for daughter Gabrielle [name] (dpr on file)

## 2016-09-28 ENCOUNTER — Other Ambulatory Visit: Payer: Self-pay | Admitting: *Deleted

## 2016-09-28 NOTE — Patient Outreach (Signed)
Request received from [facility], LCSW to refer patient to [name].  Referral made on 09/28/16.  Meals will begin ASAP.  Programmer, multimedia will notify patient.

## 2016-09-28 NOTE — Telephone Encounter (Signed)
Is the question from daugther related to   1. How not to make patient forget to take medication 2x/ day?  - we can look at once a day leavquin 500mg  for a week  2. Or ways to convince patient that she is better off at assisted living?  Dr. Brand Males, M.D., [facility].C.P Pulmonary and Critical Care Medicine Staff Physician [place] [facility] Pager: [phone], If no answer or between  15:00h - 7:00h: call [phone]  09/28/2016 4:38 PM

## 2016-09-28 NOTE — Patient Outreach (Signed)
[facility]) Care [name]  09/28/2016  Gabrielle [name] [birth year] [id number]  CSW received a call from patient's daughter, Gabrielle [name] inquiring about placement options for patient.  CSW explained the entire placement process to Gabrielle [name], obtaining verbal consent to pick up patient's completed and signed FL-2 Form from patient's Primary Care Physician, Dr. Rayetta [name] office and fax to all preferred long-term care assisted living facilities.  These facilities include:  [place]:  [facility]: [facility] [facility] [address] [facility]  Gabrielle [name] is requesting that Gabrielle [name] fax patient's FL-2 Form to three assisted living facilities in [place] and four assisted living facilities in [place], as patient is still trying to decide whether she wants to remain in [place], living close to her son, or move to [place], where she will be within minutes of Gabrielle [name] if she decides on any of the above-named assisted living facilities. Gabrielle [name], along with patient's son, Gabrielle [name], are both still trying to convince patient that placement is the best option for her at this time.  Patient is very reluctant to leave her home, but is aware that she will not be require to sell, if she decides on assisted living placement.  Patient believes that at some point she will be well-enough to return there.  Gabrielle [name] and [name] do not wish to tell patient otherwise, for fear that she will lose all hope. Gabrielle [name] is very concerned that patient is not taking her prescription medications, as prescribed, especially the antibiotics (Augmentin, 875-125 MG, twice daily) for Aspiration Pneumonia. CSW agreed to make a pharmacy consult through [facility] [name], but explained to Gabrielle [name] that it may not be a bad idea for her and Gabrielle [name] to contact patient twice daily,  during scheduled doses, to remind patient to take her morning and evening medications.  Gabrielle [name] agreed to do so. Gabrielle [name] is also planning to contact [facility] today to see about getting patient linked with [facility].  CSW provided Gabrielle [name] with the contact information, explaining services and equipment offered through their agency.  Gabrielle [name] is very concerned about patient's memory deficits, as well as the extreme clutter with which patient currently lives.  CSW has observed the clutter and hoarding in patient's home and has voiced concerns to patient, with regards to this being a major tripping hazard. Last, CSW and Gabrielle [name] are both concerned about the amount of weight that patient continues to lose, as patient admits that she often forgets to eat, or cannot remember if she has eaten.  CSW is aware that there is an extensive waiting list of Gabrielle [name] through Gabrielle [name].  Therefore, CSW has approved for patient to receive a 30-day supply of [name] through Gabrielle [name], at the expense of Gabrielle [name].  Patient does not have anyone to prepare [name] for her and she is currently extremely malnourished.  In the meantime, CSW has placed patient on the waiting list of Gabrielle [name]. CSW agreed to contact patient and Gabrielle [name] on Friday, August 3rd to follow-up regarding bed offers received.  CSW also wants to ensure that patient has [facility] in place and that she has begun receiving one hot meal per day.  CSW explained to Gabrielle [name] that the pharmacist has 10 business days to outreach to patient, after the initial order is placed.  CSW encouraged Mrs.  Gabrielle [name] to request that patient please answer her phone for all incoming calls. Gabrielle [name], BSW, MSW, LCSW  Licensed Education officer, environmental Health System  Mailing [place] [address] [place], [place]  [address] Physical Address-[address], [place], [place] [address] Toll Free Main # [phone] Fax # [phone] Cell # [phone]  Office # [phone] [name]

## 2016-09-28 NOTE — Telephone Encounter (Signed)
lmtcb X2 for pt.  

## 2016-09-28 NOTE — Telephone Encounter (Signed)
Called and spoke with pts daughter and she stated that she lives in [place] and that they are trying to get the pt into assisted living. pts daughter spoke with the SW and they suggested that she call the pt 2 times per day to remind the pt to take her medications.  Pt is currently on abx for aspiration PNA and the daughter is worried that if the pt misses many of those doses that the PNA will not clear up.  She is going to call the pt 2 x per day to remind her of the medications she needs to take, but wanted to see if there were any other suggestions from MR.  Please advise. Thanks  No Known Allergies

## 2016-09-28 NOTE — Telephone Encounter (Signed)
Attempted to contact pt's daughter. I could not get my call to go through. Will try back.

## 2016-09-28 NOTE — Telephone Encounter (Signed)
Pt daughter returning call and can be reached @ same number.Hillery Hunter

## 2016-09-29 NOTE — Telephone Encounter (Signed)
Spoke with [name]. She stated that she and her brother will continue to call the patient twice a day to ensure that she is taking the medication. The brother went over to the assisted living facility the other day and counted her pills and they were at the correct amount so they are assuming she is taking the medication.   Nothing else was needed at time of call. Will close this message.

## 2016-09-30 ENCOUNTER — Encounter: Payer: Self-pay | Admitting: Pharmacist

## 2016-09-30 ENCOUNTER — Telehealth: Payer: Self-pay | Admitting: Pharmacist

## 2016-09-30 NOTE — Patient Outreach (Signed)
Gabrielle [name]) Care Management  [place]   09/30/2016  Gabrielle [name] 1930/12/23 [id number]  Subjective: Called and spoke to patient's daughter, [name] regarding medication management and pill box set up.  HIPAA identifiers were obtained.  Patient is an 83 year old female with multiple medical conditions including but not limited to: COPD, osteoporosis, vascular dementia, history of breast cancer and history of tobacco abuse.  Patient's daughter said the patient lives alone and her brother checks on the patient a couple of times per week but is not comfortable with "medical stuff".  Daughter says she bought the patient a pill box and fills it for her when she is in town. She is unsure if her brother fills the box.  The patient's family is in the process of getting the patient moved into an assisted living facility within the next three weeks.  The family has been taking turns calling the patient to remind her to take her medications.    Objective:   Encounter Medications: Outpatient Encounter Prescriptions as of 09/30/2016  Medication Sig  . amoxicillin-clavulanate (AUGMENTIN) 875-125 MG tablet Take 1 tablet by mouth 2 (two) times daily.  Marland Kitchen anastrozole (ARIMIDEX) 1 MG tablet Take 1 tablet (1 mg total) by mouth daily.  Marland Kitchen aspirin 81 MG tablet Take 81 mg by mouth daily.  . Cholecalciferol (CVS D3) 5000 UNITS capsule Take 5,000 Units by mouth every other day.   . Cyanocobalamin (VITAMIN B-12 PO) Take 1 tablet by mouth daily.   Marland Kitchen donepezil (ARICEPT) 10 MG tablet Take 1 tablets daily.  . mirtazapine (REMERON) 7.5 MG tablet Take 1 tablet (7.5 mg total) by mouth at bedtime.  . Multiple Vitamins-Minerals (PRESERVISION AREDS 2 PO) Take 1 tablet by mouth 2 (two) times daily.   . Tiotropium Bromide Monohydrate (SPIRIVA RESPIMAT) 2.5 MCG/ACT AERS Inhale 2 puffs into the lungs daily.   No facility-administered encounter medications on file as of 09/30/2016.     Functional  Status: In your present state of health, do you have any difficulty performing the following activities: 08/19/2016  Hearing? N  Vision? Y  Difficulty concentrating or making decisions? Y  Walking or climbing stairs? Y  Dressing or bathing? N  Doing errands, shopping? Y  Preparing Food and eating ? N  Using the Toilet? N  In the past six months, have you accidently leaked urine? N  Do you have problems with loss of bowel control? N  Managing your Medications? Y  Managing your Finances? Y  Housekeeping or managing your Housekeeping? Y  Some recent data might be hidden    Fall/Depression Screening: Fall Risk  08/19/2016 08/06/2016 05/31/2016  Falls in the past year? No No No  Number falls in past yr: - - -  Injury with Fall? - - -  Risk for fall due to : Mental status change - -  Follow up - - -   PHQ 2/9 Scores 08/19/2016 08/06/2016 08/01/2015 10/05/2013  PHQ - 2 Score 0 0 0 0  Exception Documentation Medical reason - - -      Assessment: Patient's medications were reviewed with her daughter over the phone:   Drugs sorted by system:  Neurologic/Psychologic: Donepezil Mirtazapine Arimidex  Pulmonary/Allergy: Spiriva (has not been using)   Vitamins/Minerals: Multiple vitamins Cholecalciferol Cyanocobalamin  Infectious Diseases: Amoxicillin-clavulanate 875/125   (Calculated creatinine clearance 40.69ml/min)   Patient's daughter was offered an in home medication review with pill box set up and potentially an alarm placed in her  mother's home to assist with reminding the patient to take her medications.  She was also offered assistance with switching her mother over to pill packing to help with being able to assess if her mother is missing tablets and would eliminate the need to use and fill up a box.  Daughter said she would speak with her brother and would get back to me if they decided to do anything prior to getting her mother placed.   Plan:  Await a call  back Check back with the patient's daughter in 14 days.  Elayne Guerin, PharmD, [place] Clinical Pharmacist [phone]

## 2016-10-01 ENCOUNTER — Other Ambulatory Visit: Payer: Self-pay | Admitting: *Deleted

## 2016-10-01 NOTE — Patient Outreach (Signed)
[facility]) Care Management  10/01/2016  Gabrielle [name] 08-05-1930 [id number]   CSW made an attempt to try and contact patient today to follow-up regarding social work services and resources, as well as to ensure that patient received the packet of resource information mailed to her home by CSW; however, patient was unavailable.  A HIPAA compliant message was left for patient on voicemail and CSW is currently awaiting a return call.  CSW will make a second outreach attempt within the next week, if CSW does not receive a return call from patient in the meantime. [name], BSW, MSW, LCSW  Licensed Education officer, environmental Health System  Mailing [place] [address] [place], [place] [address] Physical Address-[address], [place], [place] [address] Toll Free Main # [phone] Fax # [phone] Cell # [phone]  Office # [phone] [email or web address]

## 2016-10-06 ENCOUNTER — Telehealth: Payer: Self-pay | Admitting: *Deleted

## 2016-10-06 NOTE — Telephone Encounter (Signed)
Di Kindle a social work with [facility] called request a copy of pts FL2 be faxed to her. Fax: [phone] She stated that she is helping pt.

## 2016-10-06 NOTE — Telephone Encounter (Signed)
Copy faxed as requested.

## 2016-10-08 ENCOUNTER — Other Ambulatory Visit: Payer: Self-pay | Admitting: *Deleted

## 2016-10-08 NOTE — Patient Outreach (Signed)
[facility]) Care Management  10/08/2016  TOINI FAILLA Oct 14, 1930 [id number]   CSW was able to make contact with patient's son, Virginia Curl today to inform him that CSW has re-faxed patient's FL-2 Form to all facilities of choice, putting him as the main contact person with regards to bed offers, as patient rarely ever answers her phone.  The following is a list of all facilities patient's FL-2 Form has been faxed to, per patient's daughter, [name] Harrison's request: [facility] [place], [place], [place], [facility], [place], [place] Mr. Sarver is aware of these facilities and has been prompted with what questions may be asked of him if additional information is needed that was not provided on the FL-2 Form.  Mr. Whitson agreed to follow-up with CSW as soon as bed offers are received, giving it at least 72 hours return time.  CSW will make contact with Mr. Berish within the week, if a return call is not received in the meantime. [name], BSW, MSW, LCSW  Licensed Education officer, environmental Health System  Mailing [place] [address], [place], [place] [address] Physical Address-[address], [place], [place] [address] Toll Free Main # [phone] Fax # [phone] Cell # [phone]  Office # [phone] [email or web address]

## 2016-10-11 ENCOUNTER — Encounter: Payer: Self-pay | Admitting: *Deleted

## 2016-10-12 ENCOUNTER — Ambulatory Visit: Payer: Medicare HMO | Admitting: Gastroenterology

## 2016-10-13 ENCOUNTER — Other Ambulatory Visit: Payer: Self-pay | Admitting: *Deleted

## 2016-10-13 NOTE — Patient Outreach (Signed)
[place] [facility]) Care Management  10/13/2016  TEDDI BADALAMENTI 03-Feb-1931 [id number]   CSW received a call from patient's son, Krystan Northrop today to discuss long-term care assisted living placement options for patient.  Mr. Dobosz admitted to viewing various facilities, but not being impressed with the memory care units at any of the facilities toured.  CSW was able to fax patient's FL-2 Form to seven different facilities of choice, of the seven, only three bed offers have been received thus far.  CSW encouraged Mr. Louk to at least go and view these three facilities before completing ruling out long-term care assisted living placement for patient.  Mr. Emile voiced understanding and was agreeable to this plan. CSW will make arrangements to meet with patient on Friday, August 17th at 1:00pm to perform a routine home visit.  At that time, CSW will discuss placement options with patient, as well as review list of home care agencies that are for private hire.  Patient is still very reluctant to leave her home, wanting to clean, de-clutter and develop a system for remembering to take her prescription medications so that she will be able to remain in her home and care for herself independently.  CSW will be prepared to discuss various options with patient. [name], BSW, MSW, LCSW  Licensed Education officer, [facility]  Mailing [place] [address] [place], [place] [address] Physical Address-[address], [place], [place] [address] Toll Free Main # [phone] Fax # [phone] Cell # [phone]  Office # [phone] [email or web address]

## 2016-10-14 ENCOUNTER — Other Ambulatory Visit: Payer: Self-pay | Admitting: Pharmacist

## 2016-10-14 NOTE — Patient Outreach (Signed)
[facility]) Care Management  [place]   10/14/2016  ZAKYAH YANES 13-Dec-1930 [id number]  Subjective: Called patient's daughter back to follow up on medication adherence. HIPAA identifiers were obtained. Patient's daughter has been filling a bi-weekly pill box for her mother but is now interested in pill packing.   Objective:   Encounter Medications: Outpatient Encounter Prescriptions as of 10/14/2016  Medication Sig  . anastrozole (ARIMIDEX) 1 MG tablet Take 1 tablet (1 mg total) by mouth daily.  Marland Kitchen aspirin 81 MG tablet Take 81 mg by mouth daily.  . Cholecalciferol (CVS D3) 5000 UNITS capsule Take 5,000 Units by mouth every other day.   . Cyanocobalamin (VITAMIN B-12 PO) Take 1 tablet by mouth daily.   Marland Kitchen donepezil (ARICEPT) 10 MG tablet Take 1 tablets daily.  . mirtazapine (REMERON) 7.5 MG tablet Take 1 tablet (7.5 mg total) by mouth at bedtime.  . Multiple Vitamins-Minerals (PRESERVISION AREDS 2 PO) Take 1 tablet by mouth 2 (two) times daily.   . Tiotropium Bromide Monohydrate (SPIRIVA RESPIMAT) 2.5 MCG/ACT AERS Inhale 2 puffs into the lungs daily.  . [DISCONTINUED] amoxicillin-clavulanate (AUGMENTIN) 875-125 MG tablet Take 1 tablet by mouth 2 (two) times daily. (Patient not taking: Reported on 10/14/2016)   No facility-administered encounter medications on file as of 10/14/2016.     Functional Status: In your present state of health, do you have any difficulty performing the following activities: 08/19/2016  Hearing? N  Vision? Y  Difficulty concentrating or making decisions? Y  Walking or climbing stairs? Y  Dressing or bathing? N  Doing errands, shopping? Y  Preparing Food and eating ? N  Using the Toilet? N  In the past six months, have you accidently leaked urine? N  Do you have problems with loss of bowel control? N  Managing your Medications? Y  Managing your Finances? Y  Housekeeping or managing your Housekeeping? Y  Some recent data might be  hidden    Fall/Depression Screening: Fall Risk  08/19/2016 08/06/2016 05/31/2016  Falls in the past year? No No No  Number falls in past yr: - - -  Injury with Fall? - - -  Risk for fall due to : Mental status change - -  Follow up - - -   PHQ 2/9 Scores 08/19/2016 08/06/2016 08/01/2015 10/05/2013  PHQ - 2 Score 0 0 0 0  Exception Documentation Medical reason - - -      Assessment: After discussing the benefits of pill packing, the patient's daughter decided she would like to proceed.  She wondered if her mother's OTC medications could be put in the pill pack and if so what would be the associated costs. In addition she wondered if [facility] would use the OTC products her mother already has in stock.  [facility] was called. The Pharmacist, Rachel Bo, confirmed they could use the OTC products the patient already has. He also provided the following prices for the OTC products since the patient was using [facility] was concerned about pricing: Vitamin D 5000u #100--@2 .79 Preservision #50-$14  #120-$22 Aspirin 81mg  #120-$1.71 Vitamin B 12 1000u-$386 5000u #100-$6  Next Steps: Daughter will confirm pill packing with her mother. If her mother agrees, the process will begin:  Contact the prescribing physicians and ask them to send new prescriptions to [facility].  Set up patient's account at [facility]  Follow up with the patient at her home to be sure she knows how to use the packs.  Prescribers:  Anastrzaole-Magranat Donepezil -Dr. Posey Pronto Aspiirn-Dr. Posey Pronto  [name] Preservision Vitamin D Vitamin B 12 Mirtazapine  Ramaswamy Spiriva  Plan:  Follow up with the patient on Monday after her pulmonary office visit.  Elayne Guerin, PharmD, [place] Clinical Pharmacist [phone]

## 2016-10-15 ENCOUNTER — Other Ambulatory Visit: Payer: Self-pay | Admitting: *Deleted

## 2016-10-15 ENCOUNTER — Ambulatory Visit: Payer: Self-pay | Admitting: *Deleted

## 2016-10-15 NOTE — Patient Outreach (Signed)
[facility]) Care Management  10/15/2016  Gabrielle [name] 1931-02-09 [name]   CSW was able to meet with patient and patient's son, Gabrielle [name] today to perform a routine/discharge home visit.  CSW spent a great deal of time talking with patient and asking specific questions to try and perform a mini mental status exam. Patient was able to answer all questions appropriately and recall short-term and long-term memory items.  Patient reported, "I don't believe I have Dementia and no tests have been able to prove that I have Dementia".  CSW noted that patient has been diagnosed with Vascular Dementia and Gabrielle [name] believes that patient is experiencing some memory deficits. CSW noted that patient's living room and kitchen appeared to be even more cluttered today.  Patient admitted that she is going through all her office filing cabinets and is desperately trying to rid her house of all the trash and clutter.  Patient had separate stacks of papers, which she reported were for shredding, keeping and trashing.  Patient was able to make an arrangements with Gabrielle [name] that she would have her living room organized and free from clutter within the next month.  Gabrielle [name] stated, "Mom, I'm going to give you until September 30th, then I am going to clean up this mess for you". Patient reports that she is extremely reluctant to leave her home, believing that she is able to care for herself independently and perform all activities of daily living without assistance.  Patient reported that she remembers to take her prescription medications, she just chooses not too, at times.  Patient was encouraged to take her prescription medications exactly as prescribed, admitted that Gabrielle [name], Pharmacist with [facility], has been instrumental in getting her set up with a pill box for morning and evening medications. Patient is aware that CSW has made a referral for her to [name],  through [employer] [facility], but that they are currently on an extensive waiting list.  Patient has been approved for a 30-day supply of [name], at the expense of [name].  Patient reported that she never forgets to eat, she just does not have the appetite that she once had, attributing this to her 60 pound weight loss.  Patient and Gabrielle [name] declined CSW assisting them any further with long-term care placement arrangements for patient in an assisted living facility.  Patient and Gabrielle [name] also declined CSW making arrangements for patient to receive in-home care. CSW was able to ensure that patient, Gabrielle [name] and patient's daughter, Gabrielle [name] all have the correct contact information for CSW, encouraging them to contact CSW directly if they change their minds in the near future.  Both patient and Gabrielle [name] voiced understanding and were agreeable to this plan.  Gabrielle [name] agreed to "stay on top of things", and ensure that patient follows through with all her deadlines and commitments. CSW will perform a case closure on patient, as all goals of treatment have been met from social work standpoint and no additional social work needs have been identified at this time.  CSW will notify patient's Pharmacist with [facility], Gabrielle [name] of CSW's plans to close patient's case.  CSW will fax an update to patient's Primary Care Physician, Gabrielle [name] to ensure that they are aware of CSW's involvement with patient's plan of care.  CSW will submit a case closure request to Gabrielle [name], Care Management  Assistant with [facility], in the form of an [employer].  CSW will ensure that Gabrielle [name] is aware of Gabrielle [name], Pharmacist with [facility], continued involvement with patient's care. Gabrielle [name], BSW, MSW, LCSW  Licensed Engineer, [facility]  Mailing [place] [address] [place], [place] [address] Physical Address-[address], [place], [place] [address] Toll Free Main # [phone] Fax # [phone] Cell # [phone]  Office # [phone] [name]

## 2016-10-18 ENCOUNTER — Ambulatory Visit (INDEPENDENT_AMBULATORY_CARE_PROVIDER_SITE_OTHER)
Admission: RE | Admit: 2016-10-18 | Discharge: 2016-10-18 | Disposition: A | Discharge status: Home / Self Care | Payer: Medicare HMO | Source: Ambulatory Visit | Attending: Acute Care | Admitting: Acute Care

## 2016-10-18 ENCOUNTER — Encounter: Payer: Self-pay | Admitting: Acute Care

## 2016-10-18 ENCOUNTER — Ambulatory Visit (INDEPENDENT_AMBULATORY_CARE_PROVIDER_SITE_OTHER): Payer: Medicare HMO | Admitting: Acute Care

## 2016-10-18 ENCOUNTER — Other Ambulatory Visit: Payer: Self-pay | Admitting: Pharmacist

## 2016-10-18 ENCOUNTER — Telehealth: Payer: Self-pay | Admitting: Acute Care

## 2016-10-18 VITALS — BP 104/60 | HR 65 | Ht 64.0 in | Wt 100.0 lb

## 2016-10-18 DIAGNOSIS — J181 Lobar pneumonia, unspecified organism: Secondary | ICD-10-CM

## 2016-10-18 DIAGNOSIS — J189 Pneumonia, unspecified organism: Secondary | ICD-10-CM | POA: Diagnosis not present

## 2016-10-18 DIAGNOSIS — F17210 Nicotine dependence, cigarettes, uncomplicated: Secondary | ICD-10-CM

## 2016-10-18 DIAGNOSIS — J449 Chronic obstructive pulmonary disease, unspecified: Secondary | ICD-10-CM | POA: Diagnosis not present

## 2016-10-18 IMAGING — MR MR HEAD WO/W CM
11 of 12 series · 41 of 48 positions shown · IV contrast (multihance)
Comparison: None.

CLINICAL DATA: 83-year-old female with memory loss and dementia.
Head injury 40 years ago. Breast cancer. Initial encounter.

EXAM:
MRI HEAD WITHOUT AND WITH CONTRAST
TECHNIQUE: Multiplanar, multiecho pulse sequences of the brain and surrounding
structures were obtained without and with intravenous contrast.
CONTRAST:  10mL MULTIHANCE GADOBENATE DIMEGLUMINE 529 MG/ML IV SOLN

[Series 2: T1 · sagittal · 5.0mm · 0.45mm/px · 1 of 19 slices shown]
[im 1/19]
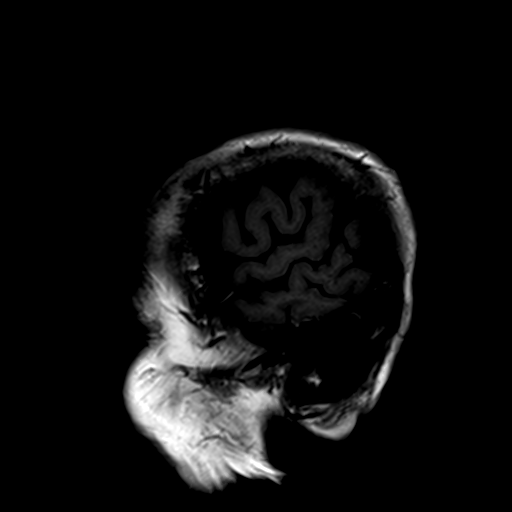

[Series 3: T2 · axial · 5.0mm · 0.45mm/px · z∈[-59,+77]mm · 2 of 22 slices shown (1 of 2)]
[im 1/22]
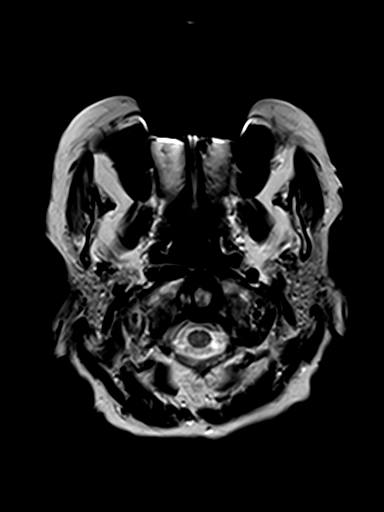
[im 22/22]
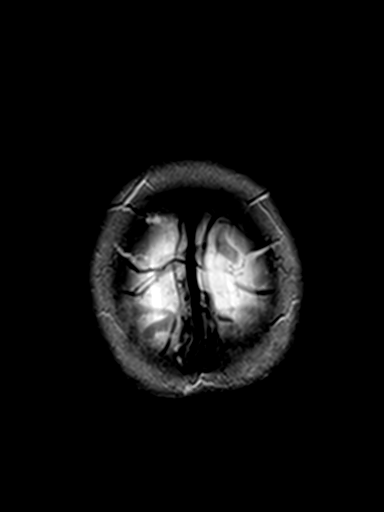

[Series 4: DWI · axial · 3.0mm · 0.90mm/px · z∈[-62,+78]mm · 6 of 80 slices shown (1 of 2)]
[im 1/80]
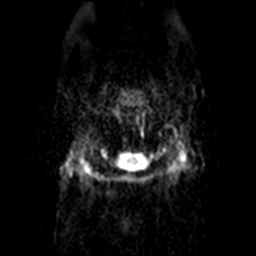
[im 16/80]
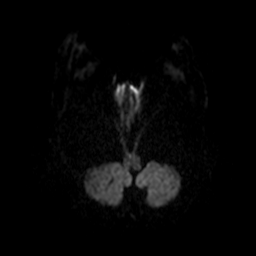
[im 32/80]
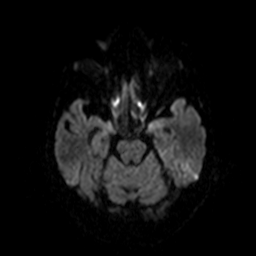
[im 48/80]
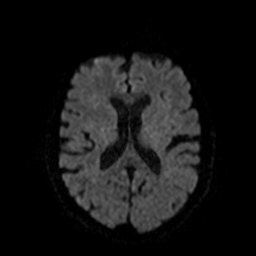
[im 64/80]
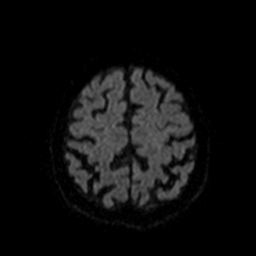
[im 80/80]
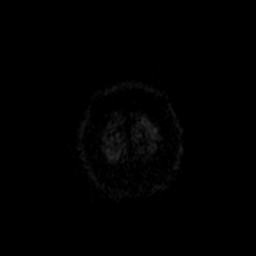

[Series 5: dwi_adc · axial · 3.0mm · 0.90mm/px · z∈[-62,+78]mm · 3 of 40 slices shown]
[im 1/40]
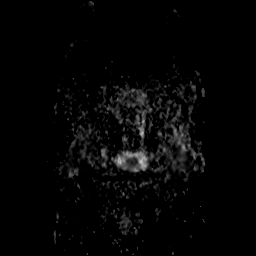
[im 20/40]
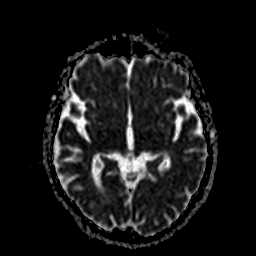
[im 40/40]
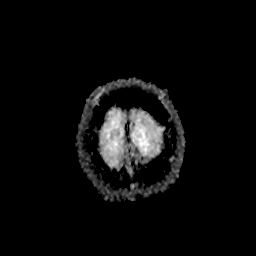

[Series 6: DWI · coronal · 3.0mm · 0.90mm/px · 8 of 102 slices shown (2 of 2)]
[im 1/102]
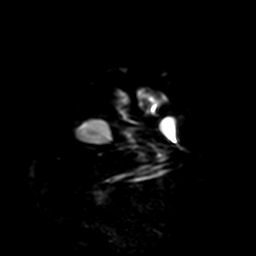
[im 15/102]
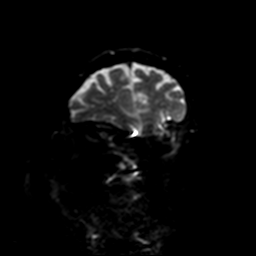
[im 29/102]
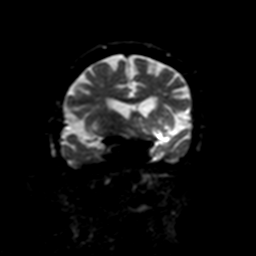
[im 44/102]
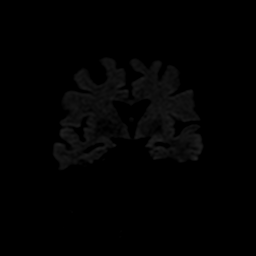
[im 58/102]
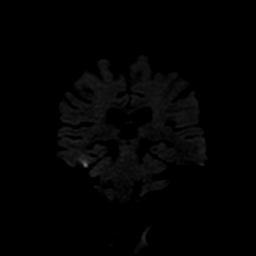
[im 73/102]
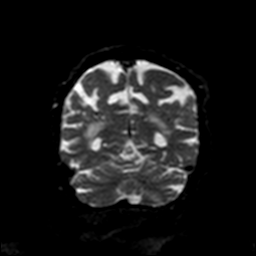
[im 87/102]
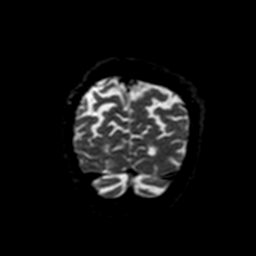
[im 102/102]
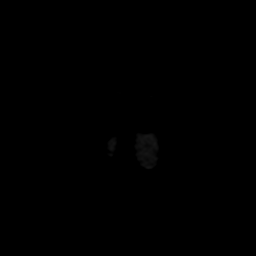

[Series 7: cor dwi_adc · coronal · 3.0mm · 0.90mm/px · 4 of 50 slices shown]
[im 1/50]
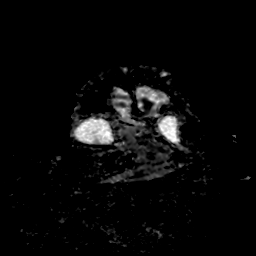
[im 17/50]
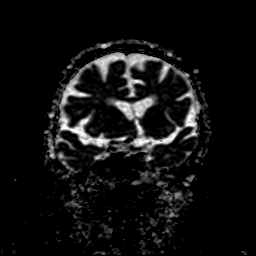
[im 33/50]
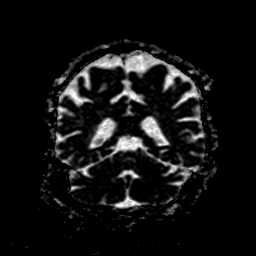
[im 50/50]
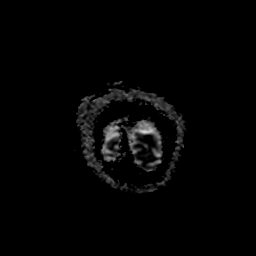

[Series 8: FLAIR · axial · 5.0mm · 0.45mm/px · z∈[-60,+77]mm · 2 of 22 slices shown]
[im 1/22]
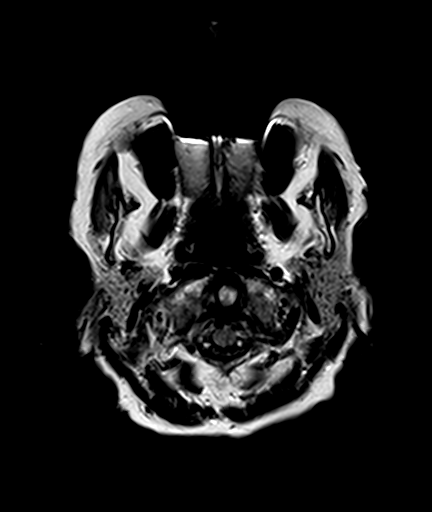
[im 22/22]
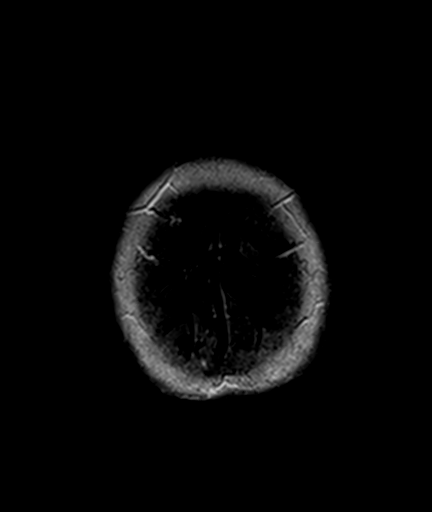

[Series 9: axial (person_name)1 volume · axial · 2.0mm · 0.45mm/px · z∈[-62,+80]mm · 6 of 72 slices shown]
[im 1/72]
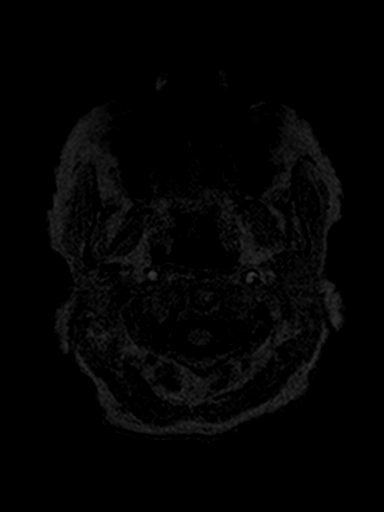
[im 15/72]
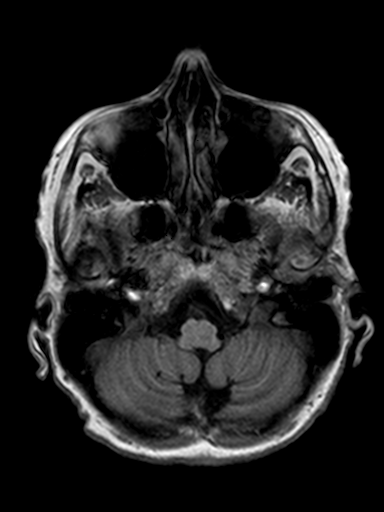
[im 29/72]
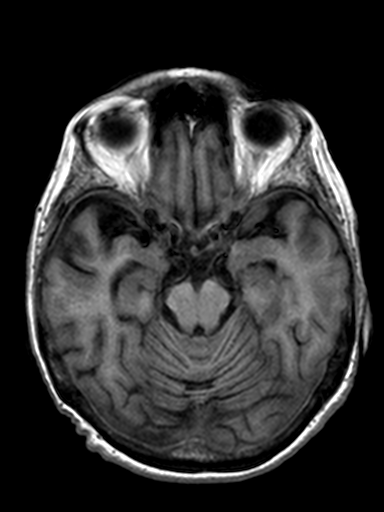
[im 43/72]
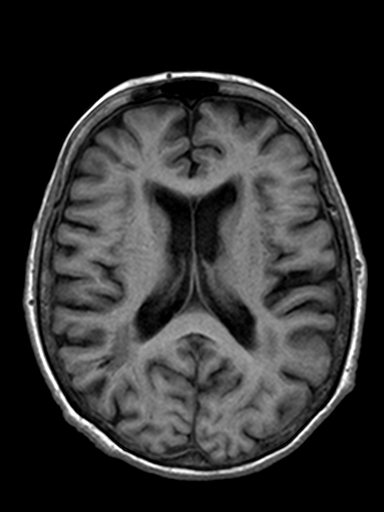
[im 57/72]
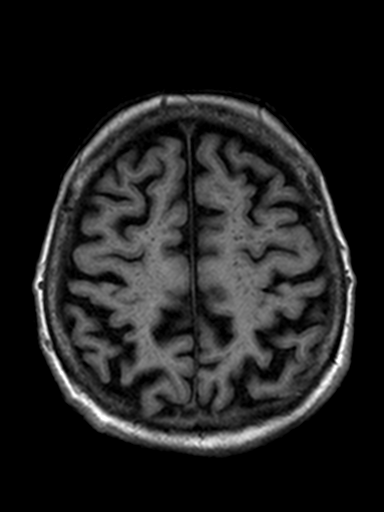
[im 72/72]
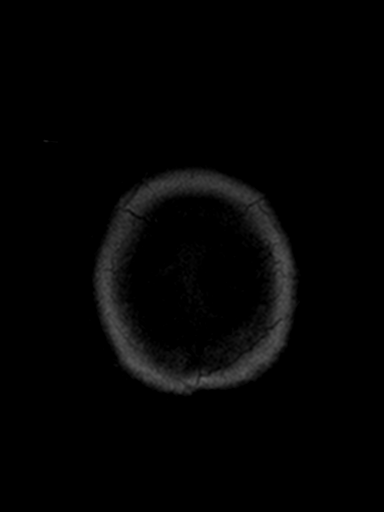

[Series 11: swi_images · axial · 2.0mm · 0.90mm/px · z∈[-62,+80]mm · 6 of 72 slices shown]
[im 1/72]
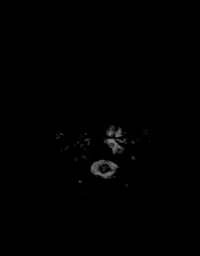
[im 15/72]
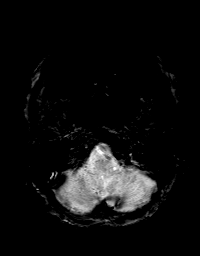
[im 29/72]
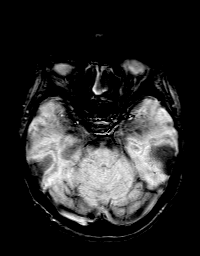
[im 43/72]
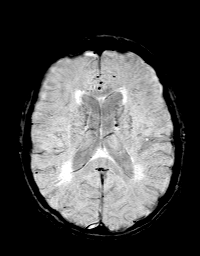
[im 57/72]
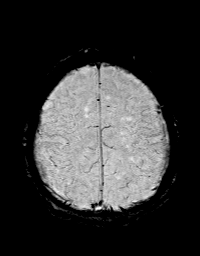
[im 72/72]
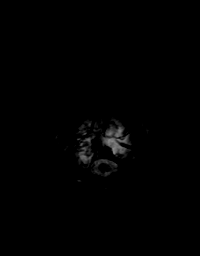

[Series 12: T2 · coronal · 5.0mm · 0.45mm/px · 2 of 24 slices shown (2 of 2)]
[im 1/24]
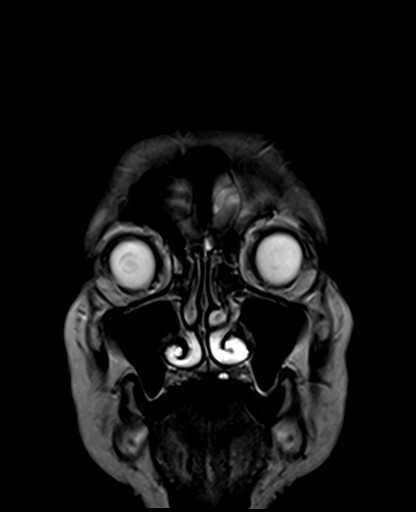
[im 24/24]
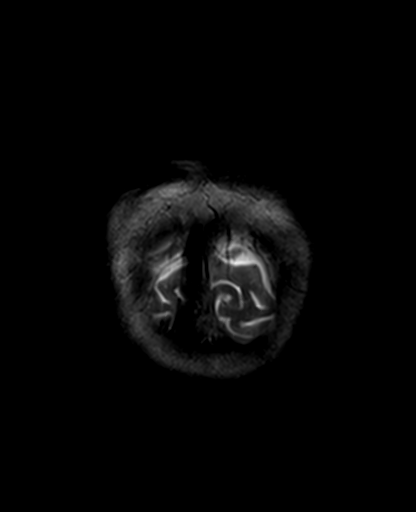

[Series 13: post_axial (person_name) · axial · 2.0mm · 0.45mm/px · 1 of 72 slices shown]
[im 1/72]
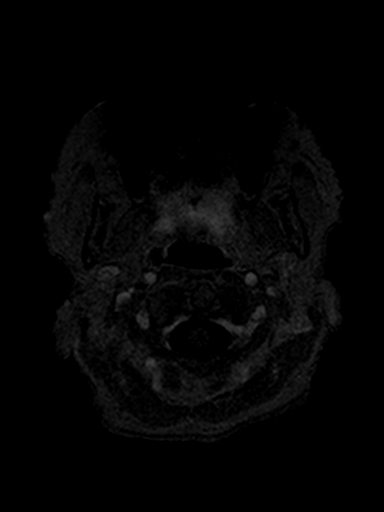

[41 of 48 positions shown; findings below may reference images not displayed]

FINDINGS: No acute infarct.

Remote medial right thalamic infarct which was partially hemorrhagic
with presence of blood breakdown products.

Tiny area of blood breakdown products adjacent to the frontal horn
left lateral ventricle most consistent with result of prior
hemorrhagic ischemia otherwise no evidence of intracranial
hemorrhage.

Remote tiny inferior cerebellar infarct.

Mild to moderate small vessel disease type changes.

No intracranial mass or bony destructive lesion to suggest the
presence of intracranial metastatic disease.

Global atrophy without hydrocephalus.

Right vertebral artery and right posterior inferior cerebellar
artery may be occluded. Remainder of major intracranial vascular
structures are patent.

Cervical medullary junction, pituitary region, pineal region and
orbital structures unremarkable.

Minimal mucosal thickening left frontal sinus and ethmoid sinus air
cells bilaterally.
IMPRESSION: No acute infarct.

Remote medial right thalamic infarct which was partially hemorrhagic
with presence of blood breakdown products.

Remote tiny inferior cerebellar infarct.

Mild to moderate small vessel disease type changes.

No intracranial mass or bony destructive lesion to suggest the
presence of intracranial metastatic disease.

Global atrophy without hydrocephalus.

Right vertebral artery and right posterior inferior cerebellar
artery may be occluded.

## 2016-10-18 NOTE — Patient Instructions (Addendum)
It is nice to meet you today. I am glad you are better. We will check a CXR today to ensure you are better. We will call you with results Please consider quitting smoking.  This is the single most powerful action you can take to decrease health issues related to smoking.  CT scan as scheduled 11/24/2016. Consider swallow evaluation after the CT scan. Consider PFTs after CT scan Follow up with Dr. Chase Caller 11/29/2016 as is scheduled Please contact office for sooner follow up if symptoms do not improve or worsen or seek emergency care

## 2016-10-18 NOTE — Telephone Encounter (Signed)
Thanks. Because she is better - just wait and get CT chest wo contrast in 1 month  Dr. Brand Males, M.D., [facility].C.P Pulmonary and Critical Care Medicine Staff Physician [place] [facility] Pager: [phone], If no answer or between  15:00h - 7:00h: call [phone]  10/18/2016 5:26 PM

## 2016-10-18 NOTE — Assessment & Plan Note (Signed)
Continues to smoke every day Counseled to stop smoking Discussed health risks of continued tobacco abuse

## 2016-10-18 NOTE — Progress Notes (Signed)
History of Present Illness Gabrielle [name] is a 83 y.o. female current every day  smoker  with pneumonia and COPD. She is followed by Dr. Birdena Jubilee.   10/18/2016 3 week Follow Up Visit: Pt. Returns for follow up. She was seen by Dr. Chase Caller for pneumonia,COPD, 09/23/2016. She had a significant weight loss in the past few months. ColoScreen was positive for blood, and PCP refer to GI.  GI consult included CT scan of abdomen and pelvis and chest. CT chest had an incidental finding of aspiration pneumonia. She was referred to Dr. Chase Caller  for evaluation. Plan after that visit was as follows:  COPD  -please restart spiriva respimat; wil help with cough - will discuss repeat PFT at followup  Pneumonia  - likely aspiration or due to poor dental hygienie  - continue augmentin as advised by GI but extend for 4 more days for total 14 days (will send Rx)  - talk to Dr Havery Moros if you could have swallowing difficulty  - definitely see dentist  - do followup CT chest wo contrast in 8 weeks  Smoking   Understand you enjoy it and wont quit  Followup  3 weeks with APP to report progress  8-10  weeks with me Dr Chase Caller but after CT chest/ Abdomen/ Pelvis.  Today's Follow Up:  Pt. Presents today stating she is much better after the Augmentin. Additionally after starting the Spiriva her cough has resolved. She denies fever, chest pain, orthopnea, or hemoptysis. Per the patient her secretions are clear to white. Per Dr. Golden Pop suggestion the patient has been seen by a dentist, who felt there was no acute oral /dental  issues that could be causing a pneumonia. They have made recommendation that she have periodontal follow-up. Patient states she does not feel that she is aspirating, as she has no coughing while eating or drinking. We discussed the possibility of silent aspiration and that the only true evaluation would be a modified barium swallow. She prefers to wait until after the CT scan  scheduled for 11/24/2016 to determine if she needs swallow evaluation.Patient weight today is 100 pounds. She states she is using meal supplements at bedtime.    Test Results: Chest x-ray 10/18/2016 COPD and previous granulomatous infection. Persistent increased density in the left lower lobe consistent with residual pneumonia although occult malignancy is not excluded. Hazy density in the right mid lung persists and there is persistent thickening of the major fissure on the right. Repeat chest CT scanning is recommended. This can be performed without contrast in the patient has compromised renal function.  CBC Latest Ref Rng & Units 09/07/2016 12/11/2015 06/11/2015  WBC 4.0 - 10.5 K/uL 12.9(H) 8.0 6.6  Hemoglobin 12.0 - 15.0 g/dL 13.9 13.9 14.3  Hematocrit 36.0 - 46.0 % 41.5 40.8 43.6  Platelets 150.0 - 400.0 K/uL 236.0 214 202    BMP Latest Ref Rng & Units 09/07/2016 12/11/2015 06/11/2015  Glucose 70 - 99 mg/dL 124(H) 192(H) 114  BUN 6 - 23 mg/dL 25(H) 14.1 18.0  Creatinine 0.40 - 1.20 mg/dL 0.85 1.0 0.9  Sodium 135 - 145 mEq/L 135 141 142  Potassium 3.5 - 5.1 mEq/L 4.3 3.9 4.1  Chloride 96 - 112 mEq/L 101 - -  CO2 19 - 32 mEq/L 25 28 28   Calcium 8.4 - 10.5 mg/dL 9.3 9.2 9.3      Past medical hx Past Medical History:  Diagnosis Date  . Anxiety   . Breast cancer of upper-outer quadrant of  right female breast ([place]) 10/2013   Anastrazole since 12/2013--plan is to take this for 5 yrs.  No evidence of dz recurrence as of 11/2015 oncol f/u.  Marland Kitchen Cataract   . COPD (chronic obstructive pulmonary disease) ([place])   . Macular degeneration   . Osteoporosis 10/2014   T-score -2.8  . Pneumonia    hx of   . Retina hole   . Skin cancer   . Stroke ([place])   . Tobacco dependence      Social History  Substance Use Topics  . Smoking status: Current Every Day Smoker    Packs/day: 0.50    Years: 60.00    Types: Cigarettes  . Smokeless tobacco: Never Used  . Alcohol use 1.2 oz/week     2 Glasses of wine per week     Comment: wine a couple times weekly    Ms.Tauzin reports that she has been smoking Cigarettes.  She has a 30.00 pack-year smoking history. She has never used smokeless tobacco. She reports that she drinks about 1.2 oz of alcohol per week . She reports that she does not use drugs.  Tobacco Cessation: Current every day smoker I have spent 3 minutes counseling patient on smoking cessation this visit.  Past surgical hx, Family hx, Social hx all reviewed.  Current Outpatient Prescriptions on File Prior to Visit  Medication Sig  . anastrozole (ARIMIDEX) 1 MG tablet Take 1 tablet (1 mg total) by mouth daily.  Marland Kitchen aspirin 81 MG tablet Take 81 mg by mouth daily.  . Cholecalciferol (CVS D3) 5000 UNITS capsule Take 5,000 Units by mouth every other day.   . Cyanocobalamin (VITAMIN B-12 PO) Take 1 tablet by mouth daily.   Marland Kitchen donepezil (ARICEPT) 10 MG tablet Take 1 tablets daily.  . mirtazapine (REMERON) 7.5 MG tablet Take 1 tablet (7.5 mg total) by mouth at bedtime.  . Multiple Vitamins-Minerals (PRESERVISION AREDS 2 PO) Take 1 tablet by mouth 2 (two) times daily.   . Tiotropium Bromide Monohydrate (SPIRIVA RESPIMAT) 2.5 MCG/ACT AERS Inhale 2 puffs into the lungs daily.   No current facility-administered medications on file prior to visit.      No Known Allergies  Review Of Systems:  Constitutional:   No  weight loss, night sweats,  Fevers, chills, + chronic fatigue, or  lassitude.  HEENT:   No headaches,  Difficulty swallowing,  Tooth/dental problems, or  Sore throat,                No sneezing, itching, ear ache, nasal congestion, post nasal drip,   CV:  No chest pain,  Orthopnea, PND, swelling in lower extremities, anasarca, dizziness, palpitations, syncope.   GI  No heartburn, indigestion, abdominal pain, nausea, vomiting, diarrhea, change in bowel habits, loss of appetite, bloody stools.   Resp: No shortness of breath with exertion or at rest.  No excess  mucus, no productive cough,  No non-productive cough,  No coughing up of blood.  No change in color of mucus.  No wheezing.  No chest wall deformity  Skin: no rash or lesions.  GU: no dysuria, change in color of urine, no urgency or frequency.  No flank pain, no hematuria   MS:  No joint pain or swelling.  No decreased range of motion.  No back pain.  Psych:  No change in mood or affect. No depression or anxiety.  No memory loss.   Vital Signs BP 104/60 (BP Location: Left Arm, Cuff Size: Normal)   Pulse  65   Ht 5\' 4"  (1.626 m)   Wt 100 lb (45.4 kg)   SpO2 95%   BMI 17.16 kg/m    Physical Exam:  General- No distress,  A&Ox3, stern affect ENT: No sinus tenderness, TM clear, pale nasal mucosa, no oral exudate,no post nasal drip, no LAN Cardiac: S1, S2, regular rate and rhythm, no murmur Chest: No wheeze/ rales/+  dullness; no accessory muscle use, no nasal flaring, no sternal retractions Abd.: Soft Non-tender, nondistended, bowel sounds positive Ext: No clubbing cyanosis, edema Neuro: Deconditioned at baseline, cranial nerves intact Skin: No rashes, warm and dry Psych: Flat affect   Assessment/Plan  Pneumonia of left lower lobe due to infectious organism [facility]) Patient states she feels better clinically after Augmentin No fever, chest pain, orthopnea, or hemoptysis Plan We will check a CXR today to ensure you are better. We will call you with results Please consider quitting smoking.  This is the single most powerful action you can take to decrease health issues related to smoking.  CT scan as scheduled 11/24/2016. Consider swallow evaluation after the CT scan. Consider PFTs after CT scan Follow up with Dr. Chase Caller 11/29/2016 as is scheduled Please contact office for sooner follow up if symptoms do not improve or worsen or seek emergency care    Cigarette smoker Continues to smoke every day Counseled to stop smoking Discussed health risks of continued tobacco  abuse     Magdalen Spatz, NP 10/18/2016  4:03 PM

## 2016-10-18 NOTE — Patient Outreach (Addendum)
[facility] [facility]) Care Management  10/18/2016  Gabrielle [name] [birth year] [id number]   Called patient's daughter to follow up on pill packs. HIPAA identifiers were obtained.  After having an in depth discussion with her daughter, patient has decided to transition to pill packaging to help with medication adherence and to alleviate the need for her daughter to fill a pill box for her every week. Patient's daughter lives in [place].  To help with the transition:  1.  An account will be set up for the patient with [facility]  2. Prescribing physicians will be contacted and asked to send new prescriptions to [facility].  Prescribers:  Anastrozole-Magrinat Donepezil -Dr. Posey Pronto Aspirin-Dr. Posey Pronto  [name] Preservision Vitamin D Vitamin B 12 Mirtazapine  Ramaswamy Spiriva    3.  Follow up with the patient at her home to be sure she knows how to use the packs.   Plan:  Implement the transition plan. Follow up with [facility] and patient's daughter in 2-3 business days.

## 2016-10-18 NOTE — Assessment & Plan Note (Signed)
Patient states she feels better clinically after Augmentin No fever, chest pain, orthopnea, or hemoptysis Plan We will check a CXR today to ensure you are better. We will call you with results Please consider quitting smoking.  This is the single most powerful action you can take to decrease health issues related to smoking.  CT scan as scheduled 11/24/2016. Consider swallow evaluation after the CT scan. Consider PFTs after CT scan Follow up with Dr. Chase Caller 11/29/2016 as is scheduled Please contact office for sooner follow up if symptoms do not improve or worsen or seek emergency care

## 2016-10-18 NOTE — Telephone Encounter (Signed)
Dr. Chase Caller, I saw Gabrielle [name] today. You had seen her for aspiration pneumonia noted incidentally on a CT scan. She was treated with Augmentin, states she is feeling better, denies fever chest pain. She's been compliant with the sprue he started with Marcello Moores complete resolution of cough. She has a follow-up CT scan scheduled for September 26. We did a chest x-ray today just to confirm she was improving. Results are below:  COPD and previous granulomatous infection. Persistent increased density in the left lower lobe consistent with residual pneumonia although occult malignancy is not excluded. Hazy density in the right mid lung persists and there is persistent thickening of the major fissure on the right. Repeat chest CT scanning is recommended. This can be performed without contrast in the patient has compromised renal function.  My question is should we retreat with Augmentin, or should we just wait for the repeat CT scan in a month. Additionally I feel we should probably get a swallow eval. The patient stated she wanted to wait until after the CT scan. Please advise.  Thank you so much

## 2016-10-19 ENCOUNTER — Telehealth: Payer: Self-pay | Admitting: Neurology

## 2016-10-19 ENCOUNTER — Other Ambulatory Visit: Payer: Self-pay | Admitting: Pharmacist

## 2016-10-19 MED ORDER — DONEPEZIL HCL 10 MG PO TABS: ORAL_TABLET | ORAL | 3 refills | Status: DC

## 2016-10-19 MED ORDER — ASPIRIN 81 MG PO TABS: 81.0000 mg | ORAL_TABLET | Freq: Every day | ORAL | 3 refills | Status: DC

## 2016-10-19 NOTE — Telephone Encounter (Signed)
Rx sent to [facility] as requested so patient can have pills packaged.

## 2016-10-19 NOTE — Patient Outreach (Signed)
[place] [facility]) Care Management  10/19/2016  Gabrielle [name] 06-18-1930 [id number]   Messages were sent to the patient's providers requesting the medications they prescribe for the patient be escribed to [facility].    [facility] was called at the patient's daughter's request to set up an account for the patient.   Plan:  Follow up at previously schedule time on pill packs and prescriptions from the providers   Elayne Guerin, PharmD, [place] Clinical Pharmacist [phone]  .

## 2016-10-21 ENCOUNTER — Other Ambulatory Visit: Payer: Self-pay | Admitting: Pharmacist

## 2016-10-21 ENCOUNTER — Telehealth: Payer: Self-pay | Admitting: Family Medicine

## 2016-10-21 MED ORDER — VITAMIN B-12 100 MCG PO TABS: 100.0000 ug | ORAL_TABLET | Freq: Every day | ORAL | 3 refills | Status: DC

## 2016-10-21 MED ORDER — MIRTAZAPINE 7.5 MG PO TABS: 7.5000 mg | ORAL_TABLET | Freq: Every day | ORAL | 3 refills | Status: DC

## 2016-10-21 MED ORDER — CHOLECALCIFEROL 125 MCG (5000 UT) PO CAPS: 5000.0000 [IU] | ORAL_CAPSULE | ORAL | 3 refills | Status: DC

## 2016-10-21 NOTE — Patient Outreach (Signed)
[facility]) Care Management  10/21/2016  DAVONNE BABY [birth year] [id number]   [place] was called on the patient's behalf to follow up on pill packing. All of the patient's providers have sent in prescriptions as required with the exception of Dr. Jana Hakim.  His office was called today and a message was left on the Triage Nurse voicemail requesting a new prescription be sent to [facility] for the patient for Arimidex.  Patient's daughter was called to update her on the status of the pill packs but she did not answer the phone. HIPAA compliant message left on her voicemail.  It was confirmed with [facility] that the patient's family can bring her remaining tablets and her OTC products to the pharmacy to be added to her pill packs.  [name] sent all prescriptions asked of her with the exception of Preservision because she does not prescribe Preservision.   Plan:  Follow up with patient's daughter and [facility] in 3 business days.  If Dr. Virgie Dad office has not sent the prescription by then, I will call them back as well.  Elayne Guerin, PharmD, [place] Clinical Pharmacist [phone]

## 2016-10-21 NOTE — Telephone Encounter (Signed)
I have completed the request by [facility] for new scripts to new pharmacy. Of  Note: Vit B12 is over the counter and has not been prescribed by me in the past. I have sent a script in , but I can not guarantee insurance will pay for it. The Preservision is not prescribed be me either an dis over the counter.

## 2016-10-21 NOTE — Telephone Encounter (Signed)
-----   Message from Elayne Guerin, [facility] sent at 10/19/2016 10:08 AM EDT ----- [name],  Please see the attached [facility] Pharmacist's note. [place] is working with Ms. Broner and her daughter to help with medication adherence. One strategy the patient would like to try is a pill packing system to alleviate the need for a family member to fill a pill box for the patient. A Pill Packing system also makes it easier to assess compliance. If deemed therapeutically appropriate, please send the following new prescriptions to [facility] (they provide the packing system):  Preservision Mirtazapine Vitamin B 12 (does the patient still need this as her last lab was >1500) Vitamin D (patient's daughter wondered about the dose of vitamin D as they have been giving 5000 units daily and the patient does not like taking 5 capsules. Patient's daughter was informed Vitamin D came in a 5000 unit capsule but she requested you be asked about the dose.)  Thank you so much for your time and consideration.   Blessings,  Elayne Guerin, PharmD, [place] Clinical Pharmacist [phone]

## 2016-10-21 NOTE — Progress Notes (Signed)
LMTCB

## 2016-10-25 ENCOUNTER — Other Ambulatory Visit: Payer: Self-pay | Admitting: *Deleted

## 2016-10-25 ENCOUNTER — Encounter: Payer: Self-pay | Admitting: Family Medicine

## 2016-10-25 ENCOUNTER — Other Ambulatory Visit: Payer: Self-pay | Admitting: Pharmacist

## 2016-10-25 MED ORDER — ANASTROZOLE 1 MG PO TABS: 1.0000 mg | ORAL_TABLET | Freq: Every day | ORAL | 3 refills | Status: DC

## 2016-10-25 NOTE — Patient Outreach (Signed)
[place] [facility]) Care Management  10/25/2016  MATISHA TERMINE November 30, 1930 [id number]  [place] on the patient's behalf to ask them to call [place] and transfer the patient's anastrozole and Spiriva to be filled with the patient's pill pack.  Called patient's daughter to let her know the status of the pill pack process. Patient wanted to have her old medications and OTC products to be put into the packs so her son will be taking the medications to [facility] this week.  Plan:  Follow up on the process in 2-3 business days.   Elayne Guerin, PharmD, [place] Clinical Pharmacist [phone]

## 2016-10-27 ENCOUNTER — Other Ambulatory Visit: Payer: Self-pay | Admitting: Pharmacist

## 2016-10-27 ENCOUNTER — Ambulatory Visit: Payer: Self-pay | Admitting: Pharmacist

## 2016-10-27 NOTE — Patient Outreach (Signed)
[place] [facility]) Care Management  10/27/2016  Gabrielle [name] 12-Apr-1930 [id number]   Patient's daughter called to let me know they had taken the balance of the patient's medications to [facility] and that the patient's pill packs were going to be scheduled for delivery.  HIPAA identifiers were obtained.    [place] was called. I spoke with [name] in the Pill Pack area and confirmed all the patient's medications were in the pack as requested.  [name] confirmed patient's pack is scheduled to go to the patient's home today between 1pm and 6pm.  Plan:  Conduct a home visit with patient to be sure she understands how to use the pill packs. (Appt scheduled for 10/28/16 at 1:00pm).  Elayne Guerin, PharmD, [place] Clinical Pharmacist [phone]

## 2016-10-28 ENCOUNTER — Other Ambulatory Visit: Payer: Self-pay | Admitting: Pharmacist

## 2016-10-28 ENCOUNTER — Ambulatory Visit: Payer: Self-pay | Admitting: Pharmacist

## 2016-10-29 ENCOUNTER — Other Ambulatory Visit: Payer: Self-pay | Admitting: Pharmacist

## 2016-10-29 NOTE — Patient Outreach (Signed)
[place] [facility]) Care Management  [place]   10/29/2016  SAE HANDRICH 04/01/1930 [id number]  Subjective: Home visit completed today at the patient's home. [facility] Telephonic Nurse, Lazaro Arms helped to complete the visit. HIPAA identifiers were obtained.  Patient is an 83 year old female with multiple medical conditions including but not limited to: COPD, osteoporosis, vascular dementia, history of breast cancer and history of tobacco abuse.  Objective:   Encounter Medications: Outpatient Encounter Prescriptions as of 10/29/2016  Medication Sig Note  . anastrozole (ARIMIDEX) 1 MG tablet Take 1 tablet (1 mg total) by mouth daily.   Marland Kitchen aspirin 81 MG tablet Take 1 tablet (81 mg total) by mouth daily.   . Cholecalciferol ([facility] D3) 5000 units capsule Take 1 capsule (5,000 Units total) by mouth every other day.   . donepezil (ARICEPT) 10 MG tablet Take 1 tablets daily.   . mirtazapine (REMERON) 7.5 MG tablet Take 1 tablet (7.5 mg total) by mouth at bedtime. 10/29/2016: Does not take when she has to work  . Multiple Vitamins-Minerals (PRESERVISION AREDS 2 PO) Take 1 tablet by mouth 2 (two) times daily.    . Tiotropium Bromide Monohydrate (SPIRIVA RESPIMAT) 2.5 MCG/ACT AERS Inhale 2 puffs into the lungs daily.   . vitamin B-12 (CYANOCOBALAMIN) 100 MCG tablet Take 1 tablet (100 mcg total) by mouth daily.    No facility-administered encounter medications on file as of 10/29/2016.     Functional Status: In your present state of health, do you have any difficulty performing the following activities: 08/19/2016  Hearing? N  Vision? Y  Difficulty concentrating or making decisions? Y  Walking or climbing stairs? Y  Dressing or bathing? N  Doing errands, shopping? Y  Preparing Food and eating ? N  Using the Toilet? N  In the past six months, have you accidently leaked urine? N  Do you have problems with loss of bowel control? N  Managing your Medications? Y  Managing your  Finances? Y  Housekeeping or managing your Housekeeping? Y  Some recent data might be hidden    Fall/Depression Screening: Fall Risk  10/29/2016 08/19/2016 08/06/2016  Falls in the past year? No No No  Number falls in past yr: - - -  Injury with Fall? - - -  Risk for fall due to : - Mental status change -  Follow up - - -   PHQ 2/9 Scores 10/29/2016 08/19/2016 08/06/2016 08/01/2015 10/05/2013  PHQ - 2 Score 3 0 0 0 0  PHQ- 9 Score 3 - - - -  Exception Documentation - Medical reason - - -      Assessment:  Patient received  "QVue Weekly" pill packs from [facility].  Each QVue Weekly pack contains 7 days worth of medication.    Patient had not taken any of her medications for today.  She was educated on how to use the packs.  Some issues were found with the packs:  1.  Only one pack was labeled with what was actually in the pack.  [facility] Pharmacist made a label for each additional pack so the patient would not get confused.   2.  Each pack had Vitamin D 5000 units in it every day. The label stated  Vitamin D 5000 units in it every other day--Confirmed with patient's daughter, she is taking vitamin D 5000 units every day.  3.  The patient was starting the pill packs on a Friday and may get confused when going  to a new pack. (Because the packs start on Sunday).  Patient was educated on this at length. Patient will be called to follow up next week.  [facility] Pharmacist physically went to [facility] to address the issues    4.  Patient's daughter confirmed patient takes Vitamin D 5000 units every day.  Plan:  Send note to PCP.  Follow up with patient on Tuesday to be sure she can use the packs.   Elayne Guerin, PharmD, [place] Clinical Pharmacist [phone]

## 2016-10-29 NOTE — Patient Outreach (Signed)
LATE ENTRY FOR 10/28/16  [place] [facility]) Care Management  10/29/2016  Gabrielle [name] August 14, 1930 [id number]  Patient's son was called prior to the home visit time to obtain the patient's physical address. HIPAA identifiers were obtained. Patient's son reported patient had not received her pill packs.  [place] was called and it was confirmed the patient did not have her pill packs but they were out for delivery.  The purpose of the home visit was to educate the patient on how to use the pill packs.   Patient's home visit was rescheduled for Friday October 29, 2016 at [place], PharmD, [place] Clinical Pharmacist [phone]

## 2016-11-02 ENCOUNTER — Other Ambulatory Visit: Payer: Self-pay | Admitting: Pharmacist

## 2016-11-02 NOTE — Patient Outreach (Signed)
[place] [facility]) Care Management  11/02/2016  DACIA CAPERS 1931-02-15 [id number]   Patient was called to follow up on medication adherence. HIPAA identifiers were obtained. Patient confirmed she has been able to use her pill packs without issue. She decided to use the same pack as the one she started on Friday.  She also shared she forgot to take Spiriva with her on her lake trip to her brother's home.  She will be home from the lake on Saturday November 06, 2016.  Plan:  Follow up with the patient and her daughter

## 2016-11-16 ENCOUNTER — Other Ambulatory Visit: Payer: Self-pay | Admitting: Pharmacist

## 2016-11-16 ENCOUNTER — Ambulatory Visit: Payer: Self-pay | Admitting: Pharmacist

## 2016-11-16 NOTE — Patient Outreach (Signed)
[place] [facility]) Care Management  11/16/2016  ATISHA HAMIDI [birth year] [id number]   Called patient and her daughter to follow up on pill packs. HIPAA compliant messages left on both voicemail boxes.  Plan:  Call patient back in 5-7 business days.   Elayne Guerin, PharmD, [place] Clinical Pharmacist [phone]

## 2016-11-23 ENCOUNTER — Other Ambulatory Visit: Payer: Self-pay | Admitting: Pharmacist

## 2016-11-23 NOTE — Patient Outreach (Signed)
[place] [facility]) Care Management  11/23/2016  Gabrielle [name] [birth year] [id number]  Patient's daughter [name]) was called. HIPAA identifiers were obtained. [name] said patient is doing well with the pill packs but wondered if patient could also get Spiriva delivered to the patient vs continuing to have it filled at [facility].  [place] was called on the patient's behalf to inquire about the next pill pack shipment and to discuss getting Spiriva transferred from [place] with [facility], Merchant navy officer, [name].  [name] said she would call [facility] and get back to me with a price. [name] and said [facility] did not have Spiriva on the patient's profile.  Patient's daughter was called back and informed.    Plan: Patient has an appointment with Pulmonology on 11/29/16, patient's daughter was advised to have then send in a new prescription.  I will call patient and daughter on 11/29/16 after her pulmonary appointment to check on the pill packs.   Elayne Guerin, PharmD, [place] Clinical Pharmacist [phone]

## 2016-11-24 ENCOUNTER — Ambulatory Visit (INDEPENDENT_AMBULATORY_CARE_PROVIDER_SITE_OTHER)
Admission: RE | Admit: 2016-11-24 | Discharge: 2016-11-24 | Disposition: A | Discharge status: Home / Self Care | Payer: Medicare HMO | Source: Ambulatory Visit | Attending: Internal Medicine | Admitting: Internal Medicine

## 2016-11-24 DIAGNOSIS — J449 Chronic obstructive pulmonary disease, unspecified: Secondary | ICD-10-CM | POA: Diagnosis not present

## 2016-11-24 DIAGNOSIS — J189 Pneumonia, unspecified organism: Secondary | ICD-10-CM

## 2016-11-24 DIAGNOSIS — J181 Lobar pneumonia, unspecified organism: Secondary | ICD-10-CM

## 2016-11-24 DIAGNOSIS — J439 Emphysema, unspecified: Secondary | ICD-10-CM | POA: Diagnosis not present

## 2016-11-29 ENCOUNTER — Encounter: Payer: Self-pay | Admitting: Internal Medicine

## 2016-11-29 ENCOUNTER — Other Ambulatory Visit: Payer: Self-pay | Admitting: Pharmacist

## 2016-11-29 ENCOUNTER — Ambulatory Visit (INDEPENDENT_AMBULATORY_CARE_PROVIDER_SITE_OTHER): Payer: Medicare HMO | Admitting: Internal Medicine

## 2016-11-29 VITALS — BP 98/64 | HR 69 | Ht 64.0 in | Wt 104.0 lb

## 2016-11-29 DIAGNOSIS — Z23 Encounter for immunization: Secondary | ICD-10-CM | POA: Diagnosis not present

## 2016-11-29 DIAGNOSIS — J189 Pneumonia, unspecified organism: Secondary | ICD-10-CM

## 2016-11-29 DIAGNOSIS — F17210 Nicotine dependence, cigarettes, uncomplicated: Secondary | ICD-10-CM

## 2016-11-29 DIAGNOSIS — J181 Lobar pneumonia, unspecified organism: Secondary | ICD-10-CM | POA: Diagnosis not present

## 2016-11-29 DIAGNOSIS — Z7189 Other specified counseling: Secondary | ICD-10-CM

## 2016-11-29 DIAGNOSIS — Z7185 Encounter for immunization safety counseling: Secondary | ICD-10-CM | POA: Insufficient documentation

## 2016-11-29 DIAGNOSIS — J449 Chronic obstructive pulmonary disease, unspecified: Secondary | ICD-10-CM

## 2016-11-29 MED ORDER — TIOTROPIUM BROMIDE MONOHYDRATE 2.5 MCG/ACT IN AERS: 2.0000 | INHALATION_SPRAY | Freq: Every day | RESPIRATORY_TRACT | 5 refills | Status: DC

## 2016-11-29 MED ORDER — PNEUMOCOCCAL VAC POLYVALENT 25 MCG/0.5ML IJ INJ: 0.5000 mL | INJECTION | INTRAMUSCULAR | Status: DC

## 2016-11-29 MED ORDER — PNEUMOCOCCAL VAC POLYVALENT 25 MCG/0.5ML IJ INJ
0.5000 mL | INJECTION | Freq: Once | INTRAMUSCULAR | Status: AC
  Administered 2016-11-29: 0.5 mL via INTRAMUSCULAR

## 2016-11-29 NOTE — Patient Outreach (Signed)
[place] [facility]) Care Management  [place]   11/29/2016  LABRENDA LASKY 1930-04-20 [id number]  Subjective: Patient's daughter was called to follow up on pill packing.  HIPAA identifiers were obtained.  Patient's daughter said her mother has been using her pill packs as instructed and is out of medications. [place] [facility] said they were going to deliver the pill pack to her last week but did not.  [place] was called and they have experienced some personnel turn-over and now have a new Pharmacist, [place].   Objective:   Encounter Medications: Outpatient Encounter Prescriptions as of 11/29/2016  Medication Sig Note  . anastrozole (ARIMIDEX) 1 MG tablet Take 1 tablet (1 mg total) by mouth daily.   Marland Kitchen aspirin 81 MG tablet Take 1 tablet (81 mg total) by mouth daily.   . Cholecalciferol (CVS D3) 5000 units capsule Take 1 capsule (5,000 Units total) by mouth every other day. 11/30/2016: Patient 1 capsule daily  . donepezil (ARICEPT) 10 MG tablet Take 1 tablets daily.   . mirtazapine (REMERON) 7.5 MG tablet Take 1 tablet (7.5 mg total) by mouth at bedtime. 10/29/2016: Does not take when she has to work  . Multiple Vitamins-Minerals (PRESERVISION AREDS 2 PO) Take 1 tablet by mouth 2 (two) times daily.   . Tiotropium Bromide Monohydrate (SPIRIVA RESPIMAT) 2.5 MCG/ACT AERS Inhale 2 puffs into the lungs daily.   . vitamin B-12 (CYANOCOBALAMIN) 100 MCG tablet Take 1 tablet (100 mcg total) by mouth daily. 11/30/2016: Patient taking Vitamin B12 540mcg tablets 1 tablet daily  . [DISCONTINUED] Tiotropium Bromide Monohydrate (SPIRIVA RESPIMAT) 2.5 MCG/ACT AERS Inhale 2 puffs into the lungs daily.    No facility-administered encounter medications on file as of 11/29/2016.     Functional Status: In your present state of health, do you have any difficulty performing the following activities: 08/19/2016  Hearing? N  Vision? Y  Difficulty concentrating or making  decisions? Y  Walking or climbing stairs? Y  Dressing or bathing? N  Doing errands, shopping? Y  Preparing Food and eating ? N  Using the Toilet? N  In the past six months, have you accidently leaked urine? N  Do you have problems with loss of bowel control? N  Managing your Medications? Y  Managing your Finances? Y  Housekeeping or managing your Housekeeping? Y  Some recent data might be hidden    Fall/Depression Screening: Fall Risk  10/29/2016 08/19/2016 08/06/2016  Falls in the past year? No No No  Number falls in past yr: - - -  Injury with Fall? - - -  Risk for fall due to : - Mental status change -  Follow up - - -   PHQ 2/9 Scores 10/29/2016 08/19/2016 08/06/2016 08/01/2015 10/05/2013  PHQ - 2 Score 3 0 0 0 0  PHQ- 9 Score 3 - - - -  Exception Documentation - Medical reason - - -      Assessment: Patient has been out of her medication due to a personnel issue at [facility]. Spoke with pharmacist, [place] to review the patient's medications. There was some confusion because patient's son took some of her old medications to the pharmacy to be put in the pill pack as they would have been too early to fill.    The issue with the pack delivery was resolved. However, there was some confusion over the vitamin D dose. Patient has been taking vitamin D 5000 units daily. The prescription sent from Dr.  Kuneff had a sig of Vitamin D 5000 units 1 capsule every other day.  A note was sent to [name] to clarify the dose. Patient had a vitamin D level in 2015 that was 79.  Spiriva cost $47  In addition, patient was taking Vitamin B 12 500mg  not 100mg --med list updated.  Plan: 1. Route note to [name] 2. [name] was sent a personal message about the vitamin D dose 3. Follow up with patient and her daughter in 2-3 business days.  Elayne Guerin, PharmD, [place] Clinical Pharmacist [phone]

## 2016-11-29 NOTE — Addendum Note (Signed)
Addended by: Lorretta Harp on: 11/29/2016 12:44 PM   Modules accepted: Orders

## 2016-11-29 NOTE — Patient Instructions (Addendum)
Cigarette smoker - quit smoking when able; understand you enjoy it  Pneumonia of left lower lobe due to infectious organism [facility]) - ct scan chest much improved July 2018 -> sept 2018 - CT scan suggests esophageal dysmotility - do next ct scan chest without contrast in 6 months - ok for endoscopy and colonoscopy eval but recommend under anesthesia - please talk to GI about CT scan findings    COPD, moderate ([place]) - continue spiriva respimat - send 90 day refill to [facility] family pharmacy  Vaccine counseling - high dose flu shot 11/29/2016 - pneumovax 11/29/2016 - Please talk to PCP Kuneff, Renee A, DO -  and ensure you get  shingarix vaccine  Followup 6 months or sooner if needed

## 2016-11-29 NOTE — Progress Notes (Signed)
Subjective:     Patient ID: Gabrielle [name], female   DOB: 06-05-1930, 83 y.o.   MRN: [id number]  HPI  Gabrielle [name] is a 84 y.o. female current every day  smoker  with pneumonia and COPD. She is followed by Dr. Birdena Jubilee.    Gabrielle [name] 12/07/2013  Chief Complaint  Patient presents with  . Pulmonary Consult    Pt referred by Dr. Cathie Olden for COPD.     83 year old female. 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 [name] aunt of GI doc [name]. Dr Hilarie Fredrickson and daughter Gabrielle [name] had briefed me about patient prior to visit.  She has had 50# weight loss per hx x 12 months (per Dr Gabrielle [name] notes 12/05/13 no documented loss per epic) or so with some general decline in ? Cognition.  She saw cardiology 11/07/13 and cxr showed emphysema. Resulted in CT Chest 11/19/13 that showed  - 1cm Rt breast mass    - subseuqntly seen Dr Gabrielle [name] 12/05/13 :  triple postive breast cancer - await surgery    - per Daughter's email to me   Yesterday .     Mom has invasive ductal breast cancer . Tumor is 2.1-3.0 cm . cancer is grade 2, stage 1B to stage 2. . Proliferation factor 31% . no evidence cancer has spread to lymph nodes . it is estrogen, progesterone and HER2 positive. . Current treatment plan:  mastectomy of right breast, followed by Anastrozole and Herceptin.   Prognosis:  the physicians believe there is a high probability the surgery and medications will cure the cancer.      - 52m RLL nodule  - severe emphysema +  So she has been referred here  She says that leading up to this fall 2015 - absolutely no dyspnea, cough, chest tightness. But now some cough with nasal congestion. In fact, COPD CAT Score below shows mild cough and sputum with very little dyspnea. Fatigue seeems main issue. COPD CAT score is 14 and reflects only mild symptoms burden.   Walk test 185 feet x 3 laps: did not desaturate and denies dysppnea  sPirometry today: fev1 1.5L/74%, Ratio 63  cw  MILDER FORM OF GOLD STAGE 2  COPD   Daughter has sseveral question on email   - addressed in plan   OV 09/23/2016   Chief Complaint  Patient presents with  . Follow-up    Pt last seen in 11/2013. Pt here today for abnormal CT chest. Dr. AHavery Morosadvised pt to f/u back up with pulmonary. Pt c/o DOE, prod cough with clear mucus. Pt denies CP/tightness and f/c/s.     .83year old female. Accompanied by her daughter TOlivia [name] Patient lives alone. Last seen almost 3 years ago for COPD Gold stage II and ongoing smoking and preoperative clearance and unexplained weight loss. Since then she's not followed up. According to the daughter and patient is continued to lose weights several pounds since then. It has accelerated in the last several months. Apparently an extensive workup was done and a CT scan a week ago 09/15/2016 showed left lower lobe pneumonia and pulmonary hypertension therefore they've back here. Patient continues to smoke. She reports ongoing weight loss. Ongoing cough and shortness of breath. She is no longer taking her Spiriva. The cough might be worse according to the daughter, the patient is unsure. Patient continues to live alone and take care of herself. Patient family is trying to get her into an assisted living but she is resisting.  Continues to smoke and will not quit. There is no report of dysphagia and apparently GI has cleared up. Her last visit to the Dentists was over a year ago. Patient denies any aspiration episodes or alcohol consumption.        10/18/2016 3 week Follow Up Visit: Pt. Returns for follow up. She was seen by Dr. Chase Caller for pneumonia,COPD, 09/23/2016. She had a significant weight loss in the past few months. ColoScreen was positive for blood, and PCP refer to GI.  GI consult included CT scan of abdomen and pelvis and chest. CT chest had an incidental finding of aspiration pneumonia. She was referred to Dr. Chase Caller  for evaluation. Plan after that visit was as follows:  COPD   -please restart spiriva respimat; wil help with cough - will discuss repeat PFT at followup  Pneumonia  - likely aspiration or due to poor dental hygienie  - continue augmentin as advised by GI but extend for 4 more days for total 14 days (will send Rx)  - talk to Dr Havery Moros if you could have swallowing difficulty  - definitely see dentist  - do followup CT chest wo contrast in 8 weeks  Smoking   Understand you enjoy it and wont quit  Followup  3 weeks with APP to report progress  8-10  weeks with me Dr Chase Caller but after CT chest/ Abdomen/ Pelvis.  Today's Follow Up:  Pt. Presents today stating she is much better after the Augmentin. Additionally after starting the Spiriva her cough has resolved. She denies fever, chest pain, orthopnea, or hemoptysis. Per the patient her secretions are clear to white. Per Dr. Golden Pop suggestion the patient has been seen by a dentist, who felt there was no acute oral /dental  issues that could be causing a pneumonia. They have made recommendation that she have periodontal follow-up. Patient states she does not feel that she is aspirating, as she has no coughing while eating or drinking. We discussed the possibility of silent aspiration and that the only true evaluation would be a modified barium swallow. She prefers to wait until after the CT scan scheduled for 11/24/2016 to determine if she needs swallow evaluation.Patient weight today is 100 pounds. She states she is using meal supplements at bedtime.   OV 11/29/2016  Chief Complaint  Patient presents with  . Follow-up    Pt still has occ. SOB but not necessarily just with exertion and occ. cough with clear mucus. Denies any CP.     Gabrielle [name] is the aunt of GI Dr Billie Lade.  she presents for several issues.   COPD: She's not taking her Spiriva inhaler  Daughter Gabrielle [name] with her. sHe tells me that during switch pharmacies the Spiriva that left out.   COPD is considered stable     smoking: She continues to smoke she enjoys it. Refuses to quit  - Lower lobe pneumonia: She had follow-up CT scan of the chest that shows significant improvement since July 2018. She still has some nodularity. She will need a repeat CT chest. She again denies any aspiration. The CT scan does show esophageal dysmotility. She will follow-up with GI. Daughter is asking about safety of doing endoscopy and colonoscopy.   -        CAT COPD Symptom & Quality of Life Score (GSK trademark) 0 is no burden. 5 is highest burden 12/07/2013   Never Cough -> Cough all the time 3  No phlegm in chest -> Chest is full  of phlegm 4  No chest tightness -> Chest feels very tight 0  No dyspnea for 1 flight stairs/hill -> Very dyspneic for 1 flight of stairs 2  No limitations for ADL at home -> Very limited with ADL at home 0  Confident leaving home -> Not at all confident leaving home 0  Sleep soundly -> Do not sleep soundly because of lung condition 0  Lots of Energy -> No energy at all 5  TOTAL Score (max 40)  14    IMPRESSION:c 11/24/16 - compared to July 2018 1. Overall improved aeration, with multifocal pulmonary opacities remaining. This is most consistent with residual or recurrent aspiration versus atypical infection. No new sites of inflammation seen. 2. Esophageal air fluid level suggests dysmotility or gastroesophageal reflux. 3. Right mastectomy. No metastatic disease or centrally obstructing lung mass. 4. Coronary artery atherosclerosis. Aortic Atherosclerosis (ICD10-I70.0). 5.  Emphysema (ICD10-J43.9).   Electronically Signed   By: Abigail Miyamoto M.D.   On: 11/24/2016 14:20    has a past medical history of Anxiety; Breast cancer of upper-outer quadrant of right female breast ([place]) (10/2013); Cataract; COPD (chronic obstructive pulmonary disease) ([place]); Macular degeneration; Osteoporosis (10/2014); Pneumonia; Retina hole; Skin cancer; Stroke ([place]); and Tobacco dependence.   reports  that she has been smoking Cigarettes.  She has a 30.00 pack-year smoking history. She has never used smokeless tobacco.  Past Surgical History:  Procedure Laterality Date  . ABDOMINAL HYSTERECTOMY  1980   (ovaries intact)  . APPENDECTOMY    . BREAST BIOPSY Right   . CATARACT EXTRACTION Bilateral   . MASTECTOMY Right   . MOHS SURGERY     Dr. Sarajane Jews  . PARS PLANA VITRECTOMY W/ REPAIR OF MACULAR HOLE Right   . RETINAL LASER PROCEDURE    . TOTAL MASTECTOMY Right 12/18/2013   Procedure: RIGHT TOTAL MASTECTOMY;  Surgeon: Excell Seltzer, MD;  Location: [facility];  Service: General;  Laterality: Right;    No Known Allergies  Immunization History  Administered Date(s) Administered  . Influenza,inj,Quad PF,6+ Mos 11/21/2013, 12/19/2014  . PPD Test 09/20/2016  . Pneumococcal Conjugate-13 11/21/2013    Family History  Problem Relation Age of Onset  . Heart attack Mother        Deceased  . Cancer Mother        fallopian tube cancer in 40s; deceased 41  . Throat cancer Father        Deceased 52; smoker  . Diabetes Brother   . Prostate cancer Brother 59       Currently 4  . Other Brother        Deceased, hemorhhage of pancreas  . Leukemia Maternal Aunt   . Breast cancer Paternal Aunt        age at diagnosis unknown  . Throat cancer Paternal Uncle        Deceased 50s; smoker  . Leukemia Paternal Grandmother      Current Outpatient Prescriptions:  .  anastrozole (ARIMIDEX) 1 MG tablet, Take 1 tablet (1 mg total) by mouth daily., Disp: 90 tablet, Rfl: 3 .  aspirin 81 MG tablet, Take 1 tablet (81 mg total) by mouth daily., Disp: 90 tablet, Rfl: 3 .  Cholecalciferol ([employer]S D3) 5000 units capsule, Take 1 capsule (5,000 Units total) by mouth every other day., Disp: 90 capsule, Rfl: 3 .  donepezil (ARICEPT) 10 MG tablet, Take 1 tablets daily., Disp: 90 tablet, Rfl: 3 .  mirtazapine (REMERON) 7.5 MG tablet, Take 1 tablet (7.5 mg  total) by mouth at bedtime., Disp: 90 tablet, Rfl: 3 .   Multiple Vitamins-Minerals (PRESERVISION AREDS 2 PO), Take 1 tablet by mouth 2 (two) times daily., Disp: , Rfl:  .  Tiotropium Bromide Monohydrate (SPIRIVA RESPIMAT) 2.5 MCG/ACT AERS, Inhale 2 puffs into the lungs daily., Disp: 1 Inhaler, Rfl: 5 .  vitamin B-12 (CYANOCOBALAMIN) 100 MCG tablet, Take 1 tablet (100 mcg total) by mouth daily., Disp: 90 tablet, Rfl: 3   Review of Systems     Objective:   Physical Exam  Constitutional: She is oriented to person, place, and time. She appears well-developed and well-nourished. No distress.  Thin, looks stronger  HENT:  Head: Normocephalic and atraumatic.  Right Ear: External ear normal.  Left Ear: External ear normal.  Mouth/Throat: Oropharynx is clear and moist. No oropharyngeal exudate.  Eyes: Pupils are equal, round, and reactive to light. Conjunctivae and EOM are normal. Right eye exhibits no discharge. Left eye exhibits no discharge. No scleral icterus.  Neck: Normal range of motion. Neck supple. No JVD present. No tracheal deviation present. No thyromegaly present.  Cardiovascular: Normal rate, regular rhythm, normal heart sounds and intact distal pulses.  Exam reveals no gallop and no friction rub.   No murmur heard. Pulmonary/Chest: Effort normal and breath sounds normal. No respiratory distress. She has no wheezes. She has no rales. She exhibits no tenderness.  kyphotic  Abdominal: Soft. Bowel sounds are normal. She exhibits no distension and no mass. There is no tenderness. There is no rebound and no guarding.  Musculoskeletal: Normal range of motion. She exhibits no edema or tenderness.  Slow but steady  Lymphadenopathy:    She has no cervical adenopathy.  Neurological: She is alert and oriented to person, place, and time. She has normal reflexes. No cranial nerve deficit. She exhibits normal muscle tone. Coordination normal.  Skin: Skin is warm and dry. No rash noted. She is not diaphoretic. No erythema. No pallor.  Psychiatric: She  has a normal mood and affect. Her behavior is normal. Judgment and thought content normal.  Vitals reviewed.  Vitals:   11/29/16 1135  BP: 98/64  Pulse: 69  SpO2: 94%  Weight: 104 lb (47.2 kg)  Height: _0  (1.626 m)    Estimated body mass index is 17.85 kg/m as calculated from the following:   Height as of this encounter: _1  (1.626 m).   Weight as of this encounter: 104 lb (47.2 kg).     Assessment:       ICD-10-CM   1. Cigarette smoker F17.210   2. Pneumonia of left lower lobe due to infectious organism ([place]) J18.1   3. COPD, moderate ([place]) J44.9   4. Vaccine counseling Z71.89        Plan:     Cigarette smoker - quit smoking when able; understand you enjoy it  Pneumonia of left lower lobe due to infectious organism [facility] [address]) - ct scan chest much improved July 2018 -> sept 2018 - CT scan suggests esophageal dysmotility - do next ct scan chest without contrast in 6 months - ok for endoscopy and colonoscopy eval but recommend under anesthesia - please talk to GI about CT scan findings    COPD, moderate ([place]) - continue spiriva respimat - send 90 day refill to [facility] pharmacy  Vaccine counseling - high dose flu shot 11/29/2016 - pneumovax 11/29/2016 - Please talk to PCP Kuneff, Renee A, DO -  and ensure you get  shingarix vaccine  Followup 6 months or  sooner if needed  > 50% of this > 25 min visit spent in face to face counseling or coordination of care    Dr. Brand Males, M.D., [facility].C.P Pulmonary and Critical Care Medicine Staff Physician [place] [facility] Pager: [phone], If no answer or between  15:00h - 7:00h: call [phone]  11/29/2016 12:24 PM

## 2016-11-30 ENCOUNTER — Telehealth: Payer: Self-pay | Admitting: *Deleted

## 2016-11-30 ENCOUNTER — Other Ambulatory Visit: Payer: Self-pay | Admitting: Family Medicine

## 2016-11-30 ENCOUNTER — Encounter: Payer: Self-pay | Admitting: Pharmacist

## 2016-11-30 MED ORDER — CHOLECALCIFEROL 50 MCG (2000 UT) PO CAPS: 2000.0000 [IU] | ORAL_CAPSULE | Freq: Every day | ORAL | 3 refills | Status: DC

## 2016-11-30 NOTE — Progress Notes (Signed)
DC prior vit d 5000 u QOD dose, per pharmacy request for daily dosing for pill packing simplicity

## 2016-11-30 NOTE — Telephone Encounter (Signed)
Patients daughter called about getting vaccines updated. Explained to daughter these can be done at patient Medicare Wellness visit . Advised her to schedule an appt .

## 2016-12-02 ENCOUNTER — Other Ambulatory Visit: Payer: Self-pay | Admitting: Pharmacist

## 2016-12-02 NOTE — Patient Outreach (Signed)
[facility]) Care Management  12/02/2016  Gabrielle [name] 1930/04/29 [id number]   Called patient's daughter to follow up on pill packs. HIPAA identifiers were obtained.  Patient's daughter confirmed they received the patient's medications in pill packs from [facility].   Plan: Follow up with the patient in 3 weeks to make sure everything is on schedule for delivery for next month. Close pharmacy case.

## 2016-12-03 ENCOUNTER — Ambulatory Visit: Payer: Medicare HMO | Admitting: Neurology

## 2016-12-03 NOTE — Progress Notes (Addendum)
Subjective:   Gabrielle [name] is a 83 y.o. female who presents for an Initial Medicare Annual Wellness Visit. Accompanied by daughter, [name].  Review of Systems    No ROS.  Medicare Wellness Visit. Additional risk factors are reflected in the social history.   Cardiac Risk Factors include: advanced age (>32men, >66 women);smoking/ tobacco exposure;family history of premature cardiovascular disease   Sleep patterns: Sleeps 4-8 hours.   Home Safety/Smoke Alarms: Feels safe in home. Smoke alarms in place.  Living environment; residence and Firearm Safety: Lives alone in single story home. Son lives about 15 minutes from pt. Discussed "life alert" system and future residence (Assisted living, retirement community).  Seat Belt Safety/Bike Helmet: Wears seat belt.   Female:   Pap-N/A      Mammo-08/23/2016, negative.       Dexa scan-11/25/2014, Osteoporosis. Ordered today at GI (breast center).    CCS-N/A. H/O positive cologuard. Followed by GI, appointment today.     Objective:    Today's Vitals   12/06/16 1317  BP: (!) 110/58  Pulse: 78  SpO2: 96%  Weight: 105 lb (47.6 kg)  Height: 5\' 4"  (1.626 m)   Body mass index is 18.02 kg/m.   Current Medications (verified) Outpatient Encounter Prescriptions as of 12/06/2016  Medication Sig  . anastrozole (ARIMIDEX) 1 MG tablet Take 1 tablet (1 mg total) by mouth daily.  Marland Kitchen aspirin 81 MG tablet Take 1 tablet (81 mg total) by mouth daily.  . Cholecalciferol 2000 units CAPS Take 1 capsule (2,000 Units total) by mouth daily.  Marland Kitchen donepezil (ARICEPT) 10 MG tablet Take 1 tablets daily.  . Multiple Vitamins-Minerals (PRESERVISION AREDS 2 PO) Take 1 tablet by mouth 2 (two) times daily.  . Tiotropium Bromide Monohydrate (SPIRIVA RESPIMAT) 2.5 MCG/ACT AERS Inhale 2 puffs into the lungs daily.  . vitamin B-12 (CYANOCOBALAMIN) 100 MCG tablet Take 1 tablet (100 mcg total) by mouth daily.  . mirtazapine (REMERON) 7.5 MG tablet Take 1 tablet (7.5 mg  total) by mouth at bedtime. (Patient not taking: Reported on 12/06/2016)   No facility-administered encounter medications on file as of 12/06/2016.     Allergies (verified) Patient has no known allergies.   History: Past Medical History:  Diagnosis Date  . Anxiety   . Breast cancer of upper-outer quadrant of right female breast ([place]) 10/2013   Anastrazole since 12/2013--plan is to take this for 5 yrs.  No evidence of dz recurrence as of 11/2015 oncol f/u.  Marland Kitchen Cataract   . COPD (chronic obstructive pulmonary disease) ([place])   . Macular degeneration   . Osteoporosis 10/2014   T-score -2.8  . Pneumonia    hx of   . Retina hole   . Skin cancer   . Stroke ([place])   . Tobacco dependence    Past Surgical History:  Procedure Laterality Date  . ABDOMINAL HYSTERECTOMY  1980   (ovaries intact)  . APPENDECTOMY    . BREAST BIOPSY Right   . CATARACT EXTRACTION Bilateral   . MASTECTOMY Right   . MOHS SURGERY     Dr. Sarajane Jews  . PARS PLANA VITRECTOMY W/ REPAIR OF MACULAR HOLE Right   . RETINAL LASER PROCEDURE    . TOTAL MASTECTOMY Right 12/18/2013   Procedure: RIGHT TOTAL MASTECTOMY;  Surgeon: Excell Seltzer, MD;  Location: [facility];  Service: General;  Laterality: Right;   Family History  Problem Relation Age of Onset  . Heart attack Mother        Deceased  .  Cancer Mother        fallopian tube cancer in 11s; deceased 54  . Throat cancer Father        Deceased 43; smoker  . Diabetes Brother   . Prostate cancer Brother 60       Currently 12  . Other Brother        Deceased, hemorhhage of pancreas  . Leukemia Maternal Aunt   . Breast cancer Paternal Aunt        age at diagnosis unknown  . Throat cancer Paternal Uncle        Deceased 81s; smoker  . Leukemia Paternal Grandmother    Social History   Occupational History  . tax preparer Retired    [employer]   Social History Main Topics  . Smoking status: Current Every Day Smoker    Packs/day: 0.50    Years: 60.00     Types: Cigarettes  . Smokeless tobacco: Never Used  . Alcohol use 1.2 oz/week    2 Glasses of wine per week     Comment: occasional  . Drug use: No  . Sexual activity: No    Tobacco Counseling Ready to quit: Not Answered Counseling given: Not Answered   Activities of Daily Living In your present state of health, do you have any difficulty performing the following activities: 12/06/2016 08/19/2016  Hearing? N N  Vision? N Y  Difficulty concentrating or making decisions? Y Y  Comment Daughter states patient has gotten lost while driving.  -  Walking or climbing stairs? N Y  Dressing or bathing? N N  Doing errands, shopping? N Y  Conservation officer, nature and eating ? N N  Using the Toilet? N N  In the past six months, have you accidently leaked urine? Y N  Comment Urgency in the am -  Do you have problems with loss of bowel control? N N  Managing your Medications? N Y  Managing your Finances? Tempie Donning  Comment Son manages finances -  Housekeeping or managing your Housekeeping? N Y  Some recent data might be hidden    Immunizations and Health Maintenance Immunization History  Administered Date(s) Administered  . Influenza, High Dose Seasonal PF 11/29/2016  . Influenza,inj,Quad PF,6+ Mos 11/21/2013, 12/19/2014  . PPD Test 09/20/2016  . Pneumococcal Conjugate-13 11/21/2013  . Pneumococcal Polysaccharide-23 11/29/2016   Health Maintenance Due  Topic Date Due  . TETANUS/TDAP  09/06/1949    Patient Care Team: Ma Hillock, DO as PCP - General (Family Medicine) Excell Seltzer, MD as Consulting Physician (General Surgery) Magrinat, Virgie Dad, MD as Consulting Physician (Oncology) Arloa Koh, MD as Consulting Physician (Radiation Oncology) Jarome Matin, MD as Consulting Physician (Dermatology) Brand Males, MD as Consulting Physician (Pulmonary Disease) Alda Berthold, DO as Consulting Physician (Neurology) Elayne Guerin, [name] as [place] Management  (Pharmacist) Armbruster, Carlota Raspberry, MD as Consulting Physician (Gastroenterology)  Indicate any recent Medical Services you may have received from other than [facility] providers in the past year (date may be approximate).     Assessment:   This is a routine wellness examination for Peters. Physical assessment deferred to PCP.   Hearing/Vision screen Hearing Screening Comments: Able to hear conversational tones w/o difficulty. No issues reported.   Vision Screening Comments: Last exam 2016, every 2 years. Dr. Zigmund Daniel.  H/O cataract surgery. Wears glasses.   Dietary issues and exercise activities discussed: Current Exercise Habits: The patient does not participate in regular exercise at present, Exercise limited  by: respiratory conditions(s)   Diet (meal preparation, eat out, water intake, caffeinated beverages, dairy products, fruits and vegetables): Drinks tea and meal supplement.   Breakfast: cereal, oatmeal Lunch: sandwich Dinner: meat and vegetables; prepared meals      Goals    . Gain weight          Increase calories.       Depression Screen PHQ 2/9 Scores 12/06/2016 10/29/2016 08/19/2016 08/06/2016 08/01/2015 10/05/2013  PHQ - 2 Score 1 3 0 0 0 0  PHQ- 9 Score - 3 - - - -  Exception Documentation - - Medical reason - - -     Patient states she in lonely at time, encouraged "adult day care" and community events for elder.   Fall Risk Fall Risk  12/06/2016 10/29/2016 08/19/2016 08/06/2016 05/31/2016  Falls in the past year? No No No No No  Number falls in past yr: - - - - -  Injury with Fall? - - - - -  Risk for fall due to : - - Mental status change - -  Follow up - - - - -   Encouraged to remove rugs on carpet.   Cognitive Function: MMSE - Mini Mental State Exam 12/06/2016 05/31/2016 11/26/2015  Not completed: - Unable to complete Unable to complete  Orientation to time 4 - -  Orientation to Place 5 - -  Registration 3 - -  Attention/ Calculation 5 - -  Recall 3 - -    Language- name 2 objects 2 - -  Language- repeat 1 - -  Language- follow 3 step command 3 - -  Language- read & follow direction 1 - -  Write a sentence 1 - -  Copy design 1 - -  Total score 29 - -   Montreal Cognitive Assessment  09/25/2015 07/25/2014 12/13/2013 10/08/2013  Visuospatial/ Executive (0/5) 4 3 4 2   Naming (0/3) 3 2 3 2   Attention: Read list of digits (0/2) 1 2 2 2   Attention: Read list of letters (0/1) 1 1 1 1   Attention: Serial 7 subtraction starting at 100 (0/3) 3 3 3 1   Language: Repeat phrase (0/2) 2 2 2 2   Language : Fluency (0/1) 0 0 0 0  Abstraction (0/2) 2 2 2 2   Delayed Recall (0/5) 4 3 2 3   Orientation (0/6) 6 6 5 5   Total 26 24 24 20   Adjusted Score (based on education) 26 24 25 20       Screening Tests Health Maintenance  Topic Date Due  . TETANUS/TDAP  09/06/1949  . INFLUENZA VACCINE  Completed  . DEXA SCAN  Completed  . PNA vac Low Risk Adult  Completed   Shingrix Rx sent to pharmacy    Plan:      Make appointment with dermatology.  Schedule bone scan.   [facility]les [facility] at [facility] on [address].   Bring a copy of your living will and/or healthcare power of attorney to your next office visit.  Continue doing brain stimulating activities (puzzles, reading, adult coloring books, staying active) to keep memory sharp.   I have personally reviewed and noted the following in the patient's chart:   . Medical and social history . Use of alcohol, tobacco or illicit drugs  . Current medications and supplements . Functional ability and status . Nutritional status . Physical activity . Advanced directives . List of other physicians . Hospitalizations, surgeries, and ER visits in previous 12 months . Vitals . Screenings to  include cognitive, depression, and falls . Referrals and appointments  In addition, I have reviewed and discussed with patient certain preventive protocols, quality metrics, and best practice recommendations. A written  personalized care plan for preventive services as well as general preventive health recommendations were provided to patient.     Gerilyn Nestle, RN   12/06/2016   PCP Notes: -Shingrix ordered. -Pt request Rx for Tdap, phone note sent -Daughter has concerns regarding safety (lives alone, uses Kerosene heater, still driving). Discussed life alert and retirement community/assisted living. Patient still hesitant to commit to either.       Medical screening examination/treatment/procedure(s) were performed by non-physician practitioner and as supervising physician I was immediately available for consultation/collaboration.  I agree with above assessment and plan.  Electronically Signed by: Howard Pouch, DO [facility]

## 2016-12-06 ENCOUNTER — Ambulatory Visit (INDEPENDENT_AMBULATORY_CARE_PROVIDER_SITE_OTHER): Payer: Medicare HMO

## 2016-12-06 ENCOUNTER — Encounter: Payer: Self-pay | Admitting: Gastroenterology

## 2016-12-06 ENCOUNTER — Ambulatory Visit (INDEPENDENT_AMBULATORY_CARE_PROVIDER_SITE_OTHER): Payer: Medicare HMO | Admitting: Gastroenterology

## 2016-12-06 ENCOUNTER — Telehealth: Payer: Self-pay

## 2016-12-06 VITALS — BP 110/62 | HR 72 | Ht 64.0 in | Wt 104.5 lb

## 2016-12-06 VITALS — BP 110/58 | HR 78 | Ht 64.0 in | Wt 105.0 lb

## 2016-12-06 DIAGNOSIS — R195 Other fecal abnormalities: Secondary | ICD-10-CM

## 2016-12-06 DIAGNOSIS — E2839 Other primary ovarian failure: Secondary | ICD-10-CM | POA: Diagnosis not present

## 2016-12-06 DIAGNOSIS — R634 Abnormal weight loss: Secondary | ICD-10-CM

## 2016-12-06 DIAGNOSIS — Z23 Encounter for immunization: Secondary | ICD-10-CM

## 2016-12-06 DIAGNOSIS — R9389 Abnormal findings on diagnostic imaging of other specified body structures: Secondary | ICD-10-CM | POA: Diagnosis not present

## 2016-12-06 DIAGNOSIS — Z0000 Encounter for general adult medical examination without abnormal findings: Secondary | ICD-10-CM

## 2016-12-06 MED ORDER — ZOSTER VAC RECOMB ADJUVANTED 50 MCG/0.5ML IM SUSR: 0.5000 mL | Freq: Once | INTRAMUSCULAR | 1 refills | Status: AC

## 2016-12-06 NOTE — Patient Instructions (Signed)
You have been scheduled for a modified barium swallow on 12-14-16 at 1:00pm. Please arrive 15 minutes prior to your test for registration. You will go to [facility]  Radiology (1st Floor) for your appointment. Should you need to cancel or reschedule your appointment, please contact [phone] [address] [place]) or [phone] [place]). _____________________________________________________________________ A Modified Barium Swallow Study, or MBS, is a special x-ray that is taken to check swallowing skills. It is carried out by a Stage manager and a Psychologist, clinical (SLP). During this test, yourmouth, throat, and esophagus, a muscular tube which connects your mouth to your stomach, is checked. The test will help you, your doctor, and the SLP plan what types of foods and liquids are easier for you to swallow. The SLP will also identify positions and ways to help you swallow more easily and safely. What will happen during an MBS? You will be taken to an x-ray room and seated comfortably. You will be asked to swallow small amounts of food and liquid mixed with barium. Barium is a liquid or paste that allows images of your mouth, throat and esophagus to be seen on x-ray. The x-ray captures moving images of the food you are swallowing as it travels from your mouth through your throat and into your esophagus. This test helps identify whether food or liquid is entering your lungs (aspiration). The test also shows which part of your mouth or throat lacks strength or coordination to move the food or liquid in the right direction. This test typically takes 30 minutes to 1 hour to complete. _______________________________________________________________________  If you are age 52 or older, your body mass index should be between 23-30. Your Body mass index is 17.94 kg/m. If this is out of the aforementioned range listed, please consider follow up with your Primary Care Provider.  If you are age 39 or  younger, your body mass index should be between 19-25. Your Body mass index is 17.94 kg/m. If this is out of the aformentioned range listed, please consider follow up with your Primary Care Provider.   Thank you.

## 2016-12-06 NOTE — Progress Notes (Signed)
HPI :  83 y/o female with a history of chronic tobacco use, COPD, history of CVA with vascular dementia  (diagnosed around 2015 or so) history of breast cancer in 2015, seen previously for positive Cologuard and weight loss.    Since the last visit she had the following workup done:  CT C/A/P 09/15/16 -  Left lower lobe pneumonia, ?aspiration?,  COPD / pulm HTN changes, no lymphadenopathy She was treated with Augmentin and seen by Dr. Chase Caller again for her COPD CT chest 10/2616 - improved aeration, residual / recurrent aspiration / atypical infection, esophageal air fluid levels suggests dysmotility or reflux  She is on Remeron 7.5mg  q HS.   She has gained a bit of weight back since our last visit. She weighed 105 lbs today - has gained a few lbs since her last visit. She thinks she is eating about the same. She states appetite is okay. No dysphagia. No nausea or vomiting. No abdominal pains. She has has intermittent constipation but not significant or bothering her. No blood in the stools. She has some baseline dyspnea, she thinks this can fluctuate. She denies any use of oxygen. She denies any coughing with swallowing. She denies any nocturnal symptoms. She sleeps with head of bed elevated. She has no overt evidence of aspiration.   No prior colonoscopy. No FH of colon cancer  Echocardiogram 01/2015 - EF 65-70%    Past Medical History:  Diagnosis Date  . Anxiety   . Breast cancer of upper-outer quadrant of right female breast ([place]) 10/2013   Anastrazole since 12/2013--plan is to take this for 5 yrs.  No evidence of dz recurrence as of 11/2015 oncol f/u.  Marland Kitchen Cataract   . COPD (chronic obstructive pulmonary disease) ([place])   . Macular degeneration   . Osteoporosis 10/2014   T-score -2.8  . Pneumonia    hx of   . Retina hole   . Skin cancer   . Stroke ([place])   . Tobacco dependence      Past Surgical History:  Procedure Laterality Date  . ABDOMINAL HYSTERECTOMY  1980   (ovaries  intact)  . APPENDECTOMY    . BREAST BIOPSY Right   . CATARACT EXTRACTION Bilateral   . MASTECTOMY Right   . MOHS SURGERY     Dr. Sarajane Jews  . PARS PLANA VITRECTOMY W/ REPAIR OF MACULAR HOLE Right   . RETINAL LASER PROCEDURE    . TOTAL MASTECTOMY Right 12/18/2013   Procedure: RIGHT TOTAL MASTECTOMY;  Surgeon: Excell Seltzer, MD;  Location: [facility];  Service: General;  Laterality: Right;   Family History  Problem Relation Age of Onset  . Heart attack Mother        Deceased  . Cancer Mother        fallopian tube cancer in 35s; deceased 32  . Throat cancer Father        Deceased 50; smoker  . Diabetes Brother   . Prostate cancer Brother 87       Currently 80  . Other Brother        Deceased, hemorhhage of pancreas  . Leukemia Maternal Aunt   . Breast cancer Paternal Aunt        age at diagnosis unknown  . Throat cancer Paternal Uncle        Deceased 32s; smoker  . Leukemia Paternal Grandmother    Social History  Substance Use Topics  . Smoking status: Current Every Day Smoker    Packs/day: 0.50  Years: 60.00    Types: Cigarettes  . Smokeless tobacco: Never Used  . Alcohol use 1.2 oz/week    2 Glasses of wine per week     Comment: occasional   Current Outpatient Prescriptions  Medication Sig Dispense Refill  . anastrozole (ARIMIDEX) 1 MG tablet Take 1 tablet (1 mg total) by mouth daily. 90 tablet 3  . aspirin 81 MG tablet Take 1 tablet (81 mg total) by mouth daily. 90 tablet 3  . Cholecalciferol 2000 units CAPS Take 1 capsule (2,000 Units total) by mouth daily. (Patient taking differently: Take 5,000 Units by mouth daily. ) 90 each 3  . donepezil (ARICEPT) 10 MG tablet Take 1 tablets daily. 90 tablet 3  . Multiple Vitamins-Minerals (PRESERVISION AREDS 2 PO) Take 1 tablet by mouth 2 (two) times daily.    . Tiotropium Bromide Monohydrate (SPIRIVA RESPIMAT) 2.5 MCG/ACT AERS Inhale 2 puffs into the lungs daily. 1 Inhaler 5  . vitamin B-12 (CYANOCOBALAMIN) 100 MCG tablet  Take 1 tablet (100 mcg total) by mouth daily. 90 tablet 3  . mirtazapine (REMERON) 7.5 MG tablet Take 1 tablet (7.5 mg total) by mouth at bedtime. (Patient not taking: Reported on 12/06/2016) 90 tablet 3  . Zoster Vac Recomb Adjuvanted [facility]) injection Inject 0.5 mLs into the muscle once. 0.5 mL 1   No current facility-administered medications for this visit.    No Known Allergies   Review of Systems: All systems reviewed and negative except where noted in HPI.    Ct Chest Wo Contrast  Result Date: 11/24/2016 CLINICAL DATA:  Followup of aspiration pneumonia in July. Smoker with COPD. EXAM: CT CHEST WITHOUT CONTRAST TECHNIQUE: Multidetector CT imaging of the chest was performed following the standard protocol without IV contrast. COMPARISON:  Plain film 10/18/2016.  CT 09/15/2016. FINDINGS: Cardiovascular: Aortic and branch vessel atherosclerosis. Tortuous thoracic aorta. Normal heart size, without pericardial effusion. Multivessel coronary artery atherosclerosis. Mediastinum/Nodes: No axillary adenopathy. No mediastinal or definite hilar adenopathy, given limitations of unenhanced CT. Mildly dilated, air-filled esophagus throughout. Fluid within on image 60/series 2. Lungs/Pleura: No pleural fluid. Advanced bullous type emphysema. calcified granuloma in the lingula. Overall improved aeration. Bilateral dependent lower lobe and right upper lobe patchy pulmonary opacities remain. Many are nodular, with a "Tree-in-bud" morphology. No suspicious pulmonary nodule or mass. Upper Abdomen: High left hepatic lobe cysts. Normal imaged portions of the stomach, kidneys. Old granulomatous disease in the spleen. Bilateral adrenal thickening suggesting hyperplasia. Musculoskeletal: Right mastectomy.  Midthoracic spondylosis. IMPRESSION: 1. Overall improved aeration, with multifocal pulmonary opacities remaining. This is most consistent with residual or recurrent aspiration versus atypical infection. No new sites  of inflammation seen. 2. Esophageal air fluid level suggests dysmotility or gastroesophageal reflux. 3. Right mastectomy. No metastatic disease or centrally obstructing lung mass. 4. Coronary artery atherosclerosis. Aortic Atherosclerosis (ICD10-I70.0). 5.  Emphysema (ICD10-J43.9). Electronically Signed   By: Abigail Miyamoto M.D.   On: 11/24/2016 14:20   Lab Results  Component Value Date   WBC 12.9 (H) 09/07/2016   HGB 13.9 09/07/2016   HCT 41.5 09/07/2016   MCV 98.0 09/07/2016   PLT 236.0 09/07/2016    Lab Results  Component Value Date   CREATININE 0.85 09/07/2016   BUN 25 (H) 09/07/2016   NA 135 09/07/2016   K 4.3 09/07/2016   CL 101 09/07/2016   CO2 25 09/07/2016    Lab Results  Component Value Date   ALT 20 09/07/2016   AST 20 09/07/2016   ALKPHOS 81 09/07/2016  BILITOT 1.0 09/07/2016      Physical Exam: BP 110/62   Pulse 72   Ht 5\' 4"  (1.626 m)   Wt 104 lb 8 oz (47.4 kg)   BMI 17.94 kg/m  Constitutional: Pleasant female in no acute distress. HEENT: Normocephalic and atraumatic. Conjunctivae are normal. No scleral icterus. Neck supple.  Cardiovascular: Normal rate, regular rhythm.  Pulmonary/chest: Effort normal and breath sounds normal although decreased BL  Abdominal: Soft,, nontender. Scaphoid, There are no masses palpable. Extremities: no edema Lymphadenopathy: No cervical adenopathy noted. Neurological: Alert and oriented to person place and time. Skin: Skin is warm and dry. No rashes noted. Psychiatric: Normal mood and affect. Behavior is normal.   ASSESSMENT AND PLAN: 83 year old female here for a follow-up visit to discuss the following issues:  Abnormal CT imaging of the chest / pneumonia / weight loss - CT scan did not show any evidence of malignancy since her last visit however did show left-sided pneumonia, potentially related to aspiration versus atypical infectious process. She denies any overt aspiration symptoms or dysphagia. Following a course  of antibiotics her CT scan looked improved in regards to pulmonary process. She did have some questionable reflux versus dysmotility on CT scan. She denies any dysphagia at all. I discussed options with patient and her daughter [name] at length today. It is possible she is having silent aspiration, I think a modified barium swallow is reasonable to assess her risk for aspiration in regards to potential risk for recurrent pneumonias. There were agreeable to this. In the interim her weight has gone up 5 pounds since last visit which is excellent. She will continue Remeron for now. We may consider an EGD pending her course, as outlined below. Otherwise she will continue to sleep with head of bed elevated and avoid eating a few hours prior to bedtime.   Abnormal stool contents - positive cologuard test. I discussed potential implications of this test. I suspect she very likely has colon polyps causing this result, and less likely she has colon cancer as there is no obvious evidence of this on CT scan, although is possible she has a early stage lesion. We discussed the only way to evaluate the stool study is with a colonoscopy. She's been cleared by pulmonary to have this done with anesthesia, if needed. I discussed with him at length with colonoscopy is and what it entails, potential risks and benefits. Patient wanted to think about this, and await modified barium study results first. If she does proceed with a colonoscopy, she would agree to have EGD done at the same time.  All questions answered.  Patient requesting her results be communicated to her daughter, [name] - [phone]  [place] Cellar, MD [facility] Gastroenterology Pager [phone]

## 2016-12-06 NOTE — Telephone Encounter (Signed)
I see you ordered the Shingrix, did you order the tdap? Im sorry but, I do not understand the request. Are you unable to order a tdap?

## 2016-12-06 NOTE — Patient Instructions (Addendum)
Make appointment with dermatology.  Schedule bone scan.   [facility] [address].   Bring a copy of your living will and/or healthcare power of attorney to your next office visit.  Continue doing brain stimulating activities (puzzles, reading, adult coloring books, staying active) to keep memory sharp.    Fall Prevention in the Home Falls can cause injuries. They can happen to people of all ages. There are many things you can do to make your home safe and to help prevent falls. What can I do on the outside of my home?  Regularly fix the edges of walkways and driveways and fix any cracks.  Remove anything that might make you trip as you walk through a door, such as a raised step or threshold.  Trim any bushes or trees on the path to your home.  Use bright outdoor lighting.  Clear any walking paths of anything that might make someone trip, such as rocks or tools.  Regularly check to see if handrails are loose or broken. Make sure that both sides of any steps have handrails.  Any raised decks and porches should have guardrails on the edges.  Have any leaves, snow, or ice cleared regularly.  Use sand or salt on walking paths during winter.  Clean up any spills in your garage right away. This includes oil or grease spills. What can I do in the bathroom?  Use night lights.  Install grab bars by the toilet and in the tub and shower. Do not use towel bars as grab bars.  Use non-skid mats or decals in the tub or shower.  If you need to sit down in the shower, use a plastic, non-slip stool.  Keep the floor dry. Clean up any water that spills on the floor as soon as it happens.  Remove soap buildup in the tub or shower regularly.  Attach bath mats securely with double-sided non-slip rug tape.  Do not have throw rugs and other things on the floor that can make you trip. What can I do in the bedroom?  Use night lights.  Make sure that you have a light by  your bed that is easy to reach.  Do not use any sheets or blankets that are too big for your bed. They should not hang down onto the floor.  Have a firm chair that has side arms. You can use this for support while you get dressed.  Do not have throw rugs and other things on the floor that can make you trip. What can I do in the kitchen?  Clean up any spills right away.  Avoid walking on wet floors.  Keep items that you use a lot in easy-to-reach places.  If you need to reach something above you, use a strong step stool that has a grab bar.  Keep electrical cords out of the way.  Do not use floor polish or wax that makes floors slippery. If you must use wax, use non-skid floor wax.  Do not have throw rugs and other things on the floor that can make you trip. What can I do with my stairs?  Do not leave any items on the stairs.  Make sure that there are handrails on both sides of the stairs and use them. Fix handrails that are broken or loose. Make sure that handrails are as long as the stairways.  Check any carpeting to make sure that it is firmly attached to the stairs. Fix any carpet  that is loose or worn.  Avoid having throw rugs at the top or bottom of the stairs. If you do have throw rugs, attach them to the floor with carpet tape.  Make sure that you have a light switch at the top of the stairs and the bottom of the stairs. If you do not have them, ask someone to add them for you. What else can I do to help prevent falls?  Wear shoes that: ? Do not have high heels. ? Have rubber bottoms. ? Are comfortable and fit you well. ? Are closed at the toe. Do not wear sandals.  If you use a stepladder: ? Make sure that it is fully opened. Do not climb a closed stepladder. ? Make sure that both sides of the stepladder are locked into place. ? Ask someone to hold it for you, if possible.  Clearly mark and make sure that you can see: ? Any grab bars or handrails. ? First and  last steps. ? Where the edge of each step is.  Use tools that help you move around (mobility aids) if they are needed. These include: ? Canes. ? Walkers. ? Scooters. ? Crutches.  Turn on the lights when you go into a dark area. Replace any light bulbs as soon as they burn out.  Set up your furniture so you have a clear path. Avoid moving your furniture around.  If any of your floors are uneven, fix them.  If there are any pets around you, be aware of where they are.  Review your medicines with your doctor. Some medicines can make you feel dizzy. This can increase your chance of falling. Ask your doctor what other things that you can do to help prevent falls. This information is not intended to replace advice given to you by your health care provider. Make sure you discuss any questions you have with your health care provider. Document Released: 12/12/2008 Document Revised: 07/24/2015 Document Reviewed: 03/22/2014 Elsevier Interactive Patient Education  2018 [employer].  Health Maintenance, Female Adopting a healthy lifestyle and getting preventive care can go a long way to promote health and wellness. Talk with your health care provider about what schedule of regular examinations is right for you. This is a good chance for you to check in with your provider about disease prevention and staying healthy. In between checkups, there are plenty of things you can do on your own. Experts have done a lot of research about which lifestyle changes and preventive measures are most likely to keep you healthy. Ask your health care provider for more information. Weight and diet Eat a healthy diet  Be sure to include plenty of vegetables, fruits, low-fat dairy products, and lean protein.  Do not eat a lot of foods high in solid fats, added sugars, or salt.  Get regular exercise. This is one of the most important things you can do for your health. ? Most adults should exercise for at least 150  minutes each week. The exercise should increase your heart rate and make you sweat (moderate-intensity exercise). ? Most adults should also do strengthening exercises at least twice a week. This is in addition to the moderate-intensity exercise.  Maintain a healthy weight  Body mass index (BMI) is a measurement that can be used to identify possible weight problems. It estimates body fat based on height and weight. Your health care provider can help determine your BMI and help you achieve or maintain a healthy weight.  For  females 35 years of age and older: ? A BMI below 18.5 is considered underweight. ? A BMI of 18.5 to 24.9 is normal. ? A BMI of 25 to 29.9 is considered overweight. ? A BMI of 30 and above is considered obese.  Watch levels of cholesterol and blood lipids  You should start having your blood tested for lipids and cholesterol at 81 years of age, then have this test every 5 years.  You may need to have your cholesterol levels checked more often if: ? Your lipid or cholesterol levels are high. ? You are older than 81 years of age. ? You are at high risk for heart disease.  Cancer screening Lung Cancer  Lung cancer screening is recommended for adults 18-106 years old who are at high risk for lung cancer because of a history of smoking.  A yearly low-dose CT scan of the lungs is recommended for people who: ? Currently smoke. ? Have quit within the past 15 years. ? Have at least a 30-pack-year history of smoking. A pack year is smoking an average of one pack of cigarettes a day for 1 year.  Yearly screening should continue until it has been 15 years since you quit.  Yearly screening should stop if you develop a health problem that would prevent you from having lung cancer treatment.  Breast Cancer  Practice breast self-awareness. This means understanding how your breasts normally appear and feel.  It also means doing regular breast self-exams. Let your health care  provider know about any changes, no matter how small.  If you are in your 20s or 30s, you should have a clinical breast exam (CBE) by a health care provider every 1-3 years as part of a regular health exam.  If you are 43 or older, have a CBE every year. Also consider having a breast X-ray (mammogram) every year.  If you have a family history of breast cancer, talk to your health care provider about genetic screening.  If you are at high risk for breast cancer, talk to your health care provider about having an MRI and a mammogram every year.  Breast cancer gene (BRCA) assessment is recommended for women who have family members with BRCA-related cancers. BRCA-related cancers include: ? Breast. ? Ovarian. ? Tubal. ? Peritoneal cancers.  Results of the assessment will determine the need for genetic counseling and BRCA1 and BRCA2 testing.  Cervical Cancer Your health care provider may recommend that you be screened regularly for cancer of the pelvic organs (ovaries, uterus, and vagina). This screening involves a pelvic examination, including checking for microscopic changes to the surface of your cervix (Pap test). You may be encouraged to have this screening done every 3 years, beginning at age 50.  For women ages 61-65, health care providers may recommend pelvic exams and Pap testing every 3 years, or they may recommend the Pap and pelvic exam, combined with testing for human papilloma virus (HPV), every 5 years. Some types of HPV increase your risk of cervical cancer. Testing for HPV may also be done on women of any age with unclear Pap test results.  Other health care providers may not recommend any screening for nonpregnant women who are considered low risk for pelvic cancer and who do not have symptoms. Ask your health care provider if a screening pelvic exam is right for you.  If you have had past treatment for cervical cancer or a condition that could lead to cancer, you need Pap tests  and screening for cancer for at least 20 years after your treatment. If Pap tests have been discontinued, your risk factors (such as having a new sexual partner) need to be reassessed to determine if screening should resume. Some women have medical problems that increase the chance of getting cervical cancer. In these cases, your health care provider may recommend more frequent screening and Pap tests.  Colorectal Cancer  This type of cancer can be detected and often prevented.  Routine colorectal cancer screening usually begins at 81 years of age and continues through 81 years of age.  Your health care provider may recommend screening at an earlier age if you have risk factors for colon cancer.  Your health care provider may also recommend using home test kits to check for hidden blood in the stool.  A small camera at the end of a tube can be used to examine your colon directly (sigmoidoscopy or colonoscopy). This is done to check for the earliest forms of colorectal cancer.  Routine screening usually begins at age 60.  Direct examination of the colon should be repeated every 5-10 years through 80 years of age. However, you may need to be screened more often if early forms of precancerous polyps or small growths are found.  Skin Cancer  Check your skin from head to toe regularly.  Tell your health care provider about any new moles or changes in moles, especially if there is a change in a mole's shape or color.  Also tell your health care provider if you have a mole that is larger than the size of a pencil eraser.  Always use sunscreen. Apply sunscreen liberally and repeatedly throughout the day.  Protect yourself by wearing long sleeves, pants, a wide-brimmed hat, and sunglasses whenever you are outside.  Heart disease, diabetes, and high blood pressure  High blood pressure causes heart disease and increases the risk of stroke. High blood pressure is more likely to develop  in: ? People who have blood pressure in the high end of the normal range (130-139/85-89 mm Hg). ? People who are overweight or obese. ? People who are African American.  If you are 87-14 years of age, have your blood pressure checked every 3-5 years. If you are 40 years of age or older, have your blood pressure checked every year. You should have your blood pressure measured twice-once when you are at a hospital or clinic, and once when you are not at a hospital or clinic. Record the average of the two measurements. To check your blood pressure when you are not at a hospital or clinic, you can use: ? An automated blood pressure machine at a pharmacy. ? A home blood pressure monitor.  If you are between 72 years and 51 years old, ask your health care provider if you should take aspirin to prevent strokes.  Have regular diabetes screenings. This involves taking a blood sample to check your fasting blood sugar level. ? If you are at a normal weight and have a low risk for diabetes, have this test once every three years after 81 years of age. ? If you are overweight and have a high risk for diabetes, consider being tested at a younger age or more often. Preventing infection Hepatitis B  If you have a higher risk for hepatitis B, you should be screened for this virus. You are considered at high risk for hepatitis B if: ? You were born in a country where hepatitis B is common. Ask  your health care provider which countries are considered high risk. ? Your parents were born in a high-risk country, and you have not been immunized against hepatitis B (hepatitis B vaccine). ? You have HIV or AIDS. ? You use needles to inject street drugs. ? You live with someone who has hepatitis B. ? You have had sex with someone who has hepatitis B. ? You get hemodialysis treatment. ? You take certain medicines for conditions, including cancer, organ transplantation, and autoimmune conditions.  Hepatitis C  Blood  testing is recommended for: ? Everyone born from [birth year] through 1963. ? Anyone with known risk factors for hepatitis C.  Sexually transmitted infections (STIs)  You should be screened for sexually transmitted infections (STIs) including gonorrhea and chlamydia if: ? You are sexually active and are younger than 81 years of age. ? You are older than 81 years of age and your health care provider tells you that you are at risk for this type of infection. ? Your sexual activity has changed since you were last screened and you are at an increased risk for chlamydia or gonorrhea. Ask your health care provider if you are at risk.  If you do not have HIV, but are at risk, it may be recommended that you take a prescription medicine daily to prevent HIV infection. This is called pre-exposure prophylaxis (PrEP). You are considered at risk if: ? You are sexually active and do not regularly use condoms or know the HIV status of your partner(s). ? You take drugs by injection. ? You are sexually active with a partner who has HIV.  Talk with your health care provider about whether you are at high risk of being infected with HIV. If you choose to begin PrEP, you should first be tested for HIV. You should then be tested every 3 months for as long as you are taking PrEP. Pregnancy  If you are premenopausal and you may become pregnant, ask your health care provider about preconception counseling.  If you may become pregnant, take 400 to 800 micrograms (mcg) of folic acid every day.  If you want to prevent pregnancy, talk to your health care provider about birth control (contraception). Osteoporosis and menopause  Osteoporosis is a disease in which the bones lose minerals and strength with aging. This can result in serious bone fractures. Your risk for osteoporosis can be identified using a bone density scan.  If you are 43 years of age or older, or if you are at risk for osteoporosis and fractures, ask your  health care provider if you should be screened.  Ask your health care provider whether you should take a calcium or vitamin D supplement to lower your risk for osteoporosis.  Menopause may have certain physical symptoms and risks.  Hormone replacement therapy may reduce some of these symptoms and risks. Talk to your health care provider about whether hormone replacement therapy is right for you. Follow these instructions at home:  Schedule regular health, dental, and eye exams.  Stay current with your immunizations.  Do not use any tobacco products including cigarettes, chewing tobacco, or electronic cigarettes.  If you are pregnant, do not drink alcohol.  If you are breastfeeding, limit how much and how often you drink alcohol.  Limit alcohol intake to no more than 1 drink per day for nonpregnant women. One drink equals 12 ounces of beer, 5 ounces of wine, or 1 ounces of hard liquor.  Do not use street drugs.  Do not share  needles.  Ask your health care provider for help if you need support or information about quitting drugs.  Tell your health care provider if you often feel depressed.  Tell your health care provider if you have ever been abused or do not feel safe at home. This information is not intended to replace advice given to you by your health care provider. Make sure you discuss any questions you have with your health care provider. Document Released: 08/31/2010 Document Revised: 07/24/2015 Document Reviewed: 11/19/2014 Elsevier Interactive Patient Education  [name].

## 2016-12-06 NOTE — Telephone Encounter (Signed)
Patient in today for AWV with [facility]. Patient and daughter requesting Tdap prescription. Patient states she does not remember ever receiving in the past. [facility].

## 2016-12-07 ENCOUNTER — Other Ambulatory Visit: Payer: Self-pay

## 2016-12-07 DIAGNOSIS — Z0000 Encounter for general adult medical examination without abnormal findings: Secondary | ICD-10-CM

## 2016-12-07 DIAGNOSIS — Z23 Encounter for immunization: Secondary | ICD-10-CM

## 2016-12-07 MED ORDER — TETANUS TOXOID ADSORBED 5 LFU IM SOLN: 0.5000 mL | Freq: Once | INTRAMUSCULAR | 0 refills | Status: AC

## 2016-12-07 MED ORDER — TETANUS IMMUNE GLOBULIN 250 UNIT/ML IM INJ: 250.0000 [IU] | INJECTION | Freq: Once | INTRAMUSCULAR | 0 refills | Status: AC

## 2016-12-07 MED ORDER — TETANUS TOXOID ADSORBED 5 LFU IM SOLN: 0.5000 mL | Freq: Once | INTRAMUSCULAR | 0 refills | Status: DC

## 2016-12-07 NOTE — Telephone Encounter (Signed)
I'll be happy to send in the prescription, just needed the okay from you.

## 2016-12-07 NOTE — Telephone Encounter (Signed)
Yes, it is definitely ok. Please send to her pharmacy.  I was unaware you needed to ask permission given that you can provide all other immunizations.  Thank you.

## 2016-12-07 NOTE — Addendum Note (Signed)
Addended by: Leota Jacobsen on: 12/07/2016 02:35 PM   Modules accepted: Orders

## 2016-12-08 ENCOUNTER — Other Ambulatory Visit (HOSPITAL_COMMUNITY): Payer: Self-pay | Admitting: Gastroenterology

## 2016-12-08 DIAGNOSIS — R1310 Dysphagia, unspecified: Secondary | ICD-10-CM

## 2016-12-10 ENCOUNTER — Other Ambulatory Visit: Payer: Self-pay

## 2016-12-10 DIAGNOSIS — C50411 Malignant neoplasm of upper-outer quadrant of right female breast: Secondary | ICD-10-CM

## 2016-12-12 NOTE — Progress Notes (Signed)
[place]  [phone] [phone]     ID: Gabrielle [name] DOB: Sep 13, 1930  MR#: [id number]  CHY#:[id number]  Patient Care Team: Ma Hillock, DO as PCP - General (Family Medicine) Excell Seltzer, MD as Consulting Physician (General Surgery) Gabrielle [name], Virgie Dad, MD as Consulting Physician (Oncology) Gabrielle Koh, MD as Consulting Physician (Radiation Oncology) Gabrielle Matin, MD as Consulting Physician (Dermatology) Gabrielle Males, MD as Consulting Physician (Pulmonary Disease) Gabrielle Berthold, DO as Consulting Physician (Neurology) Gabrielle [name], [facility] as [place] [facility] (Pharmacist) Armbruster, Carlota Raspberry, MD as Consulting Physician (Gastroenterology) OTHER MD: Gabrielle [name], DDS  CHIEF COMPLAINT: Triple positive breast cancer  CURRENT TREATMENT:  Anastrozole  BREAST CANCER HISTORY: From the original intake note:  Gabrielle [name] had not been to a doctor for "more than 20 years. Since Christmas 2014 and the family has become increasingly concerned that she appeared to be losing weight and was a bit more confused. They asked the patient how much weight she had lost and she said she had lost "about 50 pounds.". However, the only weight we have available before August of this year is from August of 2014 and it was 115 pounds at that time.  Nevertheless with this concern the patient agreed to be seen by a physician and on 10/06/2013 she was seen by Dr. Kathlen [name] who obtained a full battery of tests. He found the patient to have a urinary tract infection with Escherichia coli, which may explain some of her confusion. Vitamin B 12 level was 215, which is in the low normal range. Hemoglobin was 14.7 with an MCV of 99.0. The patient was started on B12 supplementation parenterally and a chest x-ray was obtained 10/05/2013 which showed evidence of emphysema and prior granulomatous disease. To make sure an occult cancer was not being missed a CT  scan of the chest abdomen and pelvis was obtained 11/19/2013. This showed a calcified granuloma in the inferior lingula. There was also a noncalcified 6 mm nodule in the lateral portion of the right lower lobe. There was no mediastinal mass or adenopathy. There were calcified granulomata in the spleen as well. There was significant atherosclerotic calcifications. There was sigmoid diverticulosis noted incidentally. In addition, a 1.8 cm right breast mass was noted.  The breast mass was evaluated further with bilateral diagnostic mammography and right breast ultrasonography of the breast Center 11/26/2013. The breast density was category C. There was indeed an irregular mass in the right breast which was on palpation firm nontender and noted at the 10:30 position. Ultrasound confirmed a hypoechoic irregular mass measuring up to 3 cm. There was no right axillary adenopathy noted.  Biopsy of the right breast mass in question 11/26/2013 showed ([id number]) and invasive ductal carcinoma, grade 2, estrogen receptor 100% positive, progesterone receptor 59% positive, both with strong staining intensity, with an MIB-1 of 31%, and with HER-2 amplification, the signals ratio being 2.79 and the copy number per cell 5.45.  On 11-2013 the patient underwent bilateral breast MRI. This showed the enhancing mass in the upper-outer quadrant of the right breast to measure 2.1 cm. There was no other findings in the right breast, left breast, or either axilla.  The patient's subsequent history is as detailed below  INTERVAL HISTORY: Gabrielle [name] returns today for follow-up and treatment of her estrogen receptor positive breast cancer accompanied by her daughter. She continues on anastrozole, which she is tolerating well.   Pt daughter notes that the pt had Cologuard completed  in the summer by the pt PCP that returned positive. The pt then had a follow up with Dr. Havery [name] who ordered a CT CAP that found aspiration pneumonia  and she was started on a 10 day course of Augmentin. Pt was referred to Pulmonologist, Dr. Chase [name] who added 4 additional days of Augmentin to total 14 day course of Augmentin and Spiriva. She was re-evaluated by Dr. Chase [name] and was informed that the pneumonia had mildly alleviated following antibiotic treatment. Pt was informed to follow up with her PCP, Dr. Havery [name] who had the patient follow up with Dentistry to determine cause of aspiration pneumonia. Dr. Havery [name] has ordered a swallow study to be completed tomorrow (12/14/2016) to aid in determination of the patient recent dx of aspiration pneumonia. Pt daughter notes that Dr. Havery [name] would like to order a colonoscopy for further evaluation of the patient symptoms.   Since Gabrielle [name]'s last visit to the office, she has had a screening mammography with tomography completed at [facility] on 08/23/2016. She also had a CT Chest completed on 11/24/2016 for f/u aspiration pneumonia in July in a smoker with COPD.   REVIEW OF SYSTEMS: Gabrielle [name] reports recent weight gain. She notes that she is still currently smoking cigarettes. She denies unusual headaches, visual changes, nausea, vomiting, or dizziness. There has been no unusual cough, phlegm production, or pleurisy. This been no change in bowel or bladder habits. She denies unexplained fatigue or unexplained weight loss, bleeding, rash, or fever. A detailed review of systems was otherwise stable.    PAST MEDICAL HISTORY: Past Medical History:  Diagnosis Date  . Anxiety   . Breast cancer of upper-outer quadrant of right female breast ([place]) 10/2013   Anastrazole since 12/2013--plan is to take this for 5 yrs.  No evidence of dz recurrence as of 11/2015 oncol f/u.  Marland Kitchen Cataract   . COPD (chronic obstructive pulmonary disease) ([place])   . Macular degeneration   . Osteoporosis 10/2014   T-score -2.8  . Pneumonia    hx of   . Retina hole   . Skin cancer   . Stroke ([place])   . Tobacco  dependence     PAST SURGICAL HISTORY: Past Surgical History:  Procedure Laterality Date  . ABDOMINAL HYSTERECTOMY  1980   (ovaries intact)  . APPENDECTOMY    . BREAST BIOPSY Right   . CATARACT EXTRACTION Bilateral   . MASTECTOMY Right   . MOHS SURGERY     Dr. Sarajane Jews  . PARS PLANA VITRECTOMY W/ REPAIR OF MACULAR HOLE Right   . RETINAL LASER PROCEDURE    . TOTAL MASTECTOMY Right 12/18/2013   Procedure: RIGHT TOTAL MASTECTOMY;  Surgeon: Excell Seltzer, MD;  Location: [facility];  Service: General;  Laterality: Right;    FAMILY HISTORY Family History  Problem Relation Age of Onset  . Heart attack Mother        Deceased  . Cancer Mother        fallopian tube cancer in 8s; deceased 101  . Throat cancer Father        Deceased 13; smoker  . Diabetes Brother   . Prostate cancer Brother 2       Currently 64  . Other Brother        Deceased, hemorhhage of pancreas  . Leukemia Maternal Aunt   . Breast cancer Paternal Aunt        age at diagnosis unknown  . Throat cancer Paternal Uncle  Deceased 24s; smoker  . Leukemia Paternal Grandmother    the patient's father died at the age of 15 from throat cancer which has been diagnosed to years before. The patient's mother was diagnosed at age 51 with fallopian tube carcinoma. She died at age 49. There is no other history of breast or ovarian cancer in the family to the patient's knowledge  GYNECOLOGIC HISTORY:  No LMP recorded. Patient has had a hysterectomy. Menarche age 78, first live birth age 89. The patient is GX P2. She underwent hysterectomy at a relatively young age, without salpingo-oophorectomy. She did not take hormone replacement however she took birth control pills remotely for approximately 2 years with no complications.  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 She is divorced. Her son Estefani Bateson lives in [place] and works as a Metallurgist. Daughter Chimene Salo lives in [place] where she works as a Architect for the [employer]. Incidentally Dr. Kathe Becton is a nephew of the patient    ADVANCED DIRECTIVES: In place; the patient's son Yvone Neu is her healthcare power of attorney. He can be reached at [phone]: Social History  Substance Use Topics  . Smoking status: Current Every Day Smoker    Packs/day: 0.50    Years: 60.00    Types: Cigarettes  . Smokeless tobacco: Never Used  . Alcohol use 1.2 oz/week    2 Glasses of wine per week     Comment: occasional     Colonoscopy: Never  PAP:  Bone density: September 2016: Osteoporosis  Lipid panel:  No Known Allergies  Current Outpatient Prescriptions  Medication Sig Dispense Refill  . anastrozole (ARIMIDEX) 1 MG tablet Take 1 tablet (1 mg total) by mouth daily. 90 tablet 3  . aspirin 81 MG tablet Take 1 tablet (81 mg total) by mouth daily. 90 tablet 3  . Cholecalciferol 2000 units CAPS Take 1 capsule (2,000 Units total) by mouth daily. (Patient taking differently: Take 5,000 Units by mouth daily. ) 90 each 3  . donepezil (ARICEPT) 10 MG tablet Take 1 tablets daily. 90 tablet 3  . Multiple Vitamins-Minerals (PRESERVISION AREDS 2 PO) Take 1 tablet by mouth 2 (two) times daily.    . Tiotropium Bromide Monohydrate (SPIRIVA RESPIMAT) 2.5 MCG/ACT AERS Inhale 2 puffs into the lungs daily. 1 Inhaler 5  . vitamin B-12 (CYANOCOBALAMIN) 100 MCG tablet Take 1 tablet (100 mcg total) by mouth daily. 90 tablet 3  . mirtazapine (REMERON) 7.5 MG tablet Take 1 tablet (7.5 mg total) by mouth at bedtime. (Patient not taking: Reported on 12/06/2016) 90 tablet 3   No current facility-administered medications for this visit.     OBJECTIVE: Elderly white woman Who appears stated age 18:   12/13/16 1340  BP: (!) 145/64  Pulse: 85  Resp: (!) 24  Temp: 97.6 F (36.4 C)  SpO2: 98%     Body mass index is 18.35 kg/m.    ECOG FS:2 - Symptomatic, <50%  confined to bed  Sclerae unicteric, EOMs intact Oropharynx clear and moist No cervical or supraclavicular adenopathy Lungs no rales or rhonchi Heart regular rate and rhythm Abd soft, nontender, positive bowel sounds MSK kyphosis but no focal spinal tenderness, no upper extremity lymphedema Neuro: nonfocal, well oriented, appropriate affect Breasts: She has undergone right mastectomy with no evidence of right chest recurrence. The left breast shows some crusting around the nipple. Skin in general  is very dry. Both axillae are benign.   LAB RESULTS:  CMP     Component Value Date/Time   NA 143 12/13/2016 1259   K 4.1 12/13/2016 1259   CL 101 09/07/2016 1704   CO2 30 (H) 12/13/2016 1259   GLUCOSE 83 12/13/2016 1259   BUN 19.2 12/13/2016 1259   CREATININE 0.8 12/13/2016 1259   CALCIUM 9.4 12/13/2016 1259   PROT 6.7 12/13/2016 1259   ALBUMIN 3.4 (L) 12/13/2016 1259   AST 25 12/13/2016 1259   ALT 21 12/13/2016 1259   ALKPHOS 89 12/13/2016 1259   BILITOT 0.32 12/13/2016 1259   GFRNONAA 79 (L) 12/19/2013 0425   GFRAA >90 12/19/2013 0425    I No results found for: SPEP  Lab Results  Component Value Date   WBC 7.0 12/13/2016   NEUTROABS 4.7 12/13/2016   HGB 13.5 12/13/2016   HCT 40.4 12/13/2016   MCV 99.1 12/13/2016   PLT 254 12/13/2016      Chemistry      Component Value Date/Time   NA 143 12/13/2016 1259   K 4.1 12/13/2016 1259   CL 101 09/07/2016 1704   CO2 30 (H) 12/13/2016 1259   BUN 19.2 12/13/2016 1259   CREATININE 0.8 12/13/2016 1259      Component Value Date/Time   CALCIUM 9.4 12/13/2016 1259   ALKPHOS 89 12/13/2016 1259   AST 25 12/13/2016 1259   ALT 21 12/13/2016 1259   BILITOT 0.32 12/13/2016 1259       No results found for: LABCA2  No components found for: LABCA125  No results for input(s): INR in the last 168 hours.  Urinalysis    Component Value Date/Time   COLORURINE YELLOW 10/05/2013 1158   APPEARANCEUR Cloudy (A) 10/05/2013 1158    LABSPEC 1.010 10/05/2013 1158   PHURINE 8.5 (A) 10/05/2013 1158   GLUCOSEU NEGATIVE 10/05/2013 1158   HGBUR NEGATIVE 10/05/2013 1158   BILIRUBINUR neg 01/22/2014 1203   KETONESUR NEGATIVE 10/05/2013 1158   PROTEINUR neg 01/22/2014 1203   UROBILINOGEN 0.2 01/22/2014 1203   UROBILINOGEN 0.2 10/05/2013 1158   NITRITE positive 01/22/2014 1203   NITRITE NEGATIVE 10/05/2013 1158   LEUKOCYTESUR small (1+) 01/22/2014 1203    STUDIES: Screening mammography with tomography Unilateral Left [facility]2016 that showed: Breast density C. No mammographic evidence of malignancy.   CT Chest completed on 11/24/2016 for f/u aspiration pneumonia in July in a smoker with COPD that showed: Overall improved aeration, with multifocal pulmonary opacities remaining. This is most consistent with residual or recurrent aspiration versus atypical infection. No new sites of inflammation seen. Esophageal air fluid level suggests dysmotility or gastroesophageal reflux. Right mastectomy. No metastatic disease or centrally obstructing lung mass. Coronary artery atherosclerosis. Aortic Atherosclerosis (ICD10-I70.0). Emphysema (ICD10-J43.9).  ASSESSMENT: 83 y.o. BRCA negative Gabrielle [name] woman status post right breast upper outer quadrant biopsy 11/26/2013 for a clinical T2 N0, stage IIA invasive ductal carcinoma, grade 2, with micropapillary features, triple positive, with an MIB-1 of 31%  (1) right simple mastectomy 12/18/2013 showed a pT2 pNX, stage II invasive ductal carcinoma, grade 2, with negative margins.  (2) 6 mm right lower lobe nodule noted on CT scans 11/19/2013  (a) continuing tobacco abuse-  (b) emphysema  (c) most recent chest CT 11/24/2016 shows no evidence of malignancy  (3) progressive weight loss: stabilized  (4) started anastrozole 01/03/2014  (a) denosumab/Prolia discussed with the patient 12/13/2016  (b) osteoporosis- Bone density 11/25/2014 showed a T score of  -  2.8  (5) started trastuzumab 02/05/2014; completed one year, last dose 02/27/2015  (a) echo  02/27/2015 shows an EF of 60-65%   (6) tobacco abuse disorder: the patient has been strongly advised to discontinue smoking  PLAN: Ridley is now 3 years out from definitive surgery for her breast cancer with no evidence of disease activity. This is very favorable.  She continues on anastrozole. Generally she is tolerating this well.  She has significant osteoporosis. We previously discussed zolendronate and she brought this to the attention of her dentist to advised her against it because of concerns regarding osteo necrosis of the jaw. Today we discussed denosumab which can have similar side effects. Nevertheless I do think she should consider it because falling and breaking a leg is probably a greater risk to her at this point then developing osteonecrosis, which is quite rare even if she has teeth pulled. The confounding problem is that she has significant periodontal disease.  If she decides to proceed with the Prolia she will let me know and we will set that up for her.  Otherwise she will see me again in one year. She knows to call for any problems that may develop before that visit.  Jayme Mednick, Virgie Dad, MD  12/13/16 2:12 PM Medical Oncology and Hematology [facility] [address] [place], [place] [address] Tel. [phone]    Fax. [phone]  This document serves as a record of services personally performed by Lurline Del, MD. It was created on her behalf by Steva Colder, a trained medical scribe. The creation of this record is based on the scribe's personal observations and the provider's statements to them. This document has been checked and approved by the attending provider.

## 2016-12-13 ENCOUNTER — Other Ambulatory Visit (HOSPITAL_BASED_OUTPATIENT_CLINIC_OR_DEPARTMENT_OTHER): Payer: Medicare HMO

## 2016-12-13 ENCOUNTER — Telehealth: Payer: Self-pay | Admitting: Oncology

## 2016-12-13 ENCOUNTER — Ambulatory Visit (HOSPITAL_BASED_OUTPATIENT_CLINIC_OR_DEPARTMENT_OTHER): Payer: Medicare HMO | Admitting: Oncology

## 2016-12-13 VITALS — BP 145/64 | HR 85 | Temp 97.6°F | Resp 24 | Ht 64.0 in | Wt 106.9 lb

## 2016-12-13 DIAGNOSIS — C50411 Malignant neoplasm of upper-outer quadrant of right female breast: Secondary | ICD-10-CM

## 2016-12-13 DIAGNOSIS — J438 Other emphysema: Secondary | ICD-10-CM

## 2016-12-13 DIAGNOSIS — F17200 Nicotine dependence, unspecified, uncomplicated: Secondary | ICD-10-CM | POA: Diagnosis not present

## 2016-12-13 DIAGNOSIS — Z170 Estrogen receptor positive status [ER+]: Secondary | ICD-10-CM | POA: Diagnosis not present

## 2016-12-13 DIAGNOSIS — R64 Cachexia: Secondary | ICD-10-CM

## 2016-12-13 DIAGNOSIS — M810 Age-related osteoporosis without current pathological fracture: Secondary | ICD-10-CM | POA: Diagnosis not present

## 2016-12-13 DIAGNOSIS — Z79811 Long term (current) use of aromatase inhibitors: Secondary | ICD-10-CM | POA: Diagnosis not present

## 2016-12-13 LAB — CBC WITH DIFFERENTIAL/PLATELET
BASO%: 1.3 % (ref 0.0–2.0)
Basophils Absolute: 0.1 10*3/uL (ref 0.0–0.1)
EOS ABS: 0.2 10*3/uL (ref 0.0–0.5)
EOS%: 2.2 % (ref 0.0–7.0)
HEMATOCRIT: 40.4 % (ref 34.8–46.6)
HEMOGLOBIN: 13.5 g/dL (ref 11.6–15.9)
LYMPH%: 21.2 % (ref 14.0–49.7)
MCH: 33.2 pg (ref 25.1–34.0)
MCHC: 33.5 g/dL (ref 31.5–36.0)
MCV: 99.1 fL (ref 79.5–101.0)
MONO#: 0.6 10*3/uL (ref 0.1–0.9)
MONO%: 7.9 % (ref 0.0–14.0)
NEUT%: 67.4 % (ref 38.4–76.8)
NEUTROS ABS: 4.7 10*3/uL (ref 1.5–6.5)
PLATELETS: 254 10*3/uL (ref 145–400)
RBC: 4.08 10*6/uL (ref 3.70–5.45)
RDW: 13.5 % (ref 11.2–14.5)
WBC: 7 10*3/uL (ref 3.9–10.3)
lymph#: 1.5 10*3/uL (ref 0.9–3.3)

## 2016-12-13 LAB — COMPREHENSIVE METABOLIC PANEL
ALBUMIN: 3.4 g/dL — AB (ref 3.5–5.0)
ALK PHOS: 89 U/L (ref 40–150)
ALT: 21 U/L (ref 0–55)
ANION GAP: 8 meq/L (ref 3–11)
AST: 25 U/L (ref 5–34)
BILIRUBIN TOTAL: 0.32 mg/dL (ref 0.20–1.20)
BUN: 19.2 mg/dL (ref 7.0–26.0)
CO2: 30 mEq/L — ABNORMAL HIGH (ref 22–29)
Calcium: 9.4 mg/dL (ref 8.4–10.4)
Chloride: 105 mEq/L (ref 98–109)
Creatinine: 0.8 mg/dL (ref 0.6–1.1)
Glucose: 83 mg/dl (ref 70–140)
Potassium: 4.1 mEq/L (ref 3.5–5.1)
Sodium: 143 mEq/L (ref 136–145)
TOTAL PROTEIN: 6.7 g/dL (ref 6.4–8.3)

## 2016-12-13 NOTE — Telephone Encounter (Signed)
Gave patient avs and calendar with appts per 10/15 los.

## 2016-12-14 ENCOUNTER — Ambulatory Visit (HOSPITAL_COMMUNITY)
Admission: RE | Admit: 2016-12-14 | Discharge: 2016-12-14 | Disposition: A | Discharge status: Home / Self Care | Payer: Medicare HMO | Source: Ambulatory Visit | Attending: Gastroenterology | Admitting: Gastroenterology

## 2016-12-14 DIAGNOSIS — Z720 Tobacco use: Secondary | ICD-10-CM | POA: Diagnosis not present

## 2016-12-14 DIAGNOSIS — R1310 Dysphagia, unspecified: Secondary | ICD-10-CM

## 2016-12-14 DIAGNOSIS — Z853 Personal history of malignant neoplasm of breast: Secondary | ICD-10-CM | POA: Insufficient documentation

## 2016-12-14 DIAGNOSIS — J449 Chronic obstructive pulmonary disease, unspecified: Secondary | ICD-10-CM | POA: Insufficient documentation

## 2016-12-14 DIAGNOSIS — T17308A Unspecified foreign body in larynx causing other injury, initial encounter: Secondary | ICD-10-CM | POA: Diagnosis not present

## 2016-12-14 DIAGNOSIS — R634 Abnormal weight loss: Secondary | ICD-10-CM

## 2016-12-14 DIAGNOSIS — R195 Other fecal abnormalities: Secondary | ICD-10-CM

## 2016-12-14 DIAGNOSIS — Z8673 Personal history of transient ischemic attack (TIA), and cerebral infarction without residual deficits: Secondary | ICD-10-CM | POA: Diagnosis not present

## 2016-12-14 DIAGNOSIS — H3530 Unspecified macular degeneration: Secondary | ICD-10-CM | POA: Diagnosis not present

## 2016-12-14 DIAGNOSIS — R9389 Abnormal findings on diagnostic imaging of other specified body structures: Secondary | ICD-10-CM

## 2016-12-16 ENCOUNTER — Other Ambulatory Visit: Payer: Self-pay

## 2016-12-16 DIAGNOSIS — K224 Dyskinesia of esophagus: Secondary | ICD-10-CM

## 2016-12-21 DIAGNOSIS — Z23 Encounter for immunization: Secondary | ICD-10-CM | POA: Diagnosis not present

## 2016-12-24 ENCOUNTER — Ambulatory Visit (HOSPITAL_COMMUNITY)
Admission: RE | Admit: 2016-12-24 | Discharge: 2016-12-24 | Disposition: A | Discharge status: Home / Self Care | Payer: Medicare HMO | Source: Ambulatory Visit | Attending: Gastroenterology | Admitting: Gastroenterology

## 2016-12-24 ENCOUNTER — Ambulatory Visit: Payer: Self-pay | Admitting: Pharmacist

## 2016-12-24 DIAGNOSIS — I700 Atherosclerosis of aorta: Secondary | ICD-10-CM | POA: Diagnosis not present

## 2016-12-24 DIAGNOSIS — K224 Dyskinesia of esophagus: Secondary | ICD-10-CM | POA: Insufficient documentation

## 2016-12-24 DIAGNOSIS — J189 Pneumonia, unspecified organism: Secondary | ICD-10-CM | POA: Diagnosis not present

## 2016-12-27 ENCOUNTER — Other Ambulatory Visit: Payer: Self-pay | Admitting: Pharmacist

## 2016-12-27 DIAGNOSIS — L853 Xerosis cutis: Secondary | ICD-10-CM | POA: Diagnosis not present

## 2016-12-27 DIAGNOSIS — D1801 Hemangioma of skin and subcutaneous tissue: Secondary | ICD-10-CM | POA: Diagnosis not present

## 2016-12-27 DIAGNOSIS — L821 Other seborrheic keratosis: Secondary | ICD-10-CM | POA: Diagnosis not present

## 2016-12-27 DIAGNOSIS — L570 Actinic keratosis: Secondary | ICD-10-CM | POA: Diagnosis not present

## 2016-12-27 DIAGNOSIS — Z85828 Personal history of other malignant neoplasm of skin: Secondary | ICD-10-CM | POA: Diagnosis not present

## 2016-12-27 NOTE — Patient Outreach (Signed)
[place] [facility]) Care Management  12/27/2016  Gabrielle [name] 06-24-1930 [id number]   [place] on patient's behalf to follow up on her pill packs. Spoke with Pharmacist, [place]. Huong confirmed the patient's pill packs were filled and delivered to the patient's home on 12/24/16.  After reviewing which medications went into the pack, it was discovered mirtazapine 7.5mg  tablets went out in the pill pack but mirtazapine was not on the patient's current medication list.  Review of the patient's chart shows mirtazapine was removed from the patient's active medication list because she reported not taking it.    Patient 's daughter was called since she manages the patient's medications.  HIPAA identifiers were obtained.  Patient's daughter confirmed patient is still taking Mirtazapine 7.5mg  on a PRN ONLY basis.  Medication list was changed to reflect the current regimen.  It was suggested to the patient's daughter that Mirtazapine be removed from pill packing since the patient does not take it daily and be filled in a bottle upon request.  Patient's daughter agreed.    [place] was called back.  Huong was instructed to remove Mirtazapine from automatic filling and no longer be placed in the patient's pill pack.  Huong communicated understanding and placed a note in their system.  Plan:  Follow up on pill packs in 2 weeks.   Close patient case after next follow up.   Elayne Guerin, PharmD, [place] Clinical Pharmacist [phone]

## 2016-12-28 ENCOUNTER — Ambulatory Visit
Admission: RE | Admit: 2016-12-28 | Discharge: 2016-12-28 | Disposition: A | Discharge status: Home / Self Care | Payer: Medicare HMO | Source: Ambulatory Visit | Attending: Family Medicine | Admitting: Family Medicine

## 2016-12-28 DIAGNOSIS — E2839 Other primary ovarian failure: Secondary | ICD-10-CM

## 2017-01-14 ENCOUNTER — Other Ambulatory Visit: Payer: Self-pay | Admitting: Pharmacist

## 2017-01-14 NOTE — Patient Outreach (Signed)
Gabrielle [name]) Care Management  01/14/2017  GROVER WOODFIELD [birth year] [id number]   [place] on the patient's behalf to be sure her pill packs were scheduled to be delivered soon and to verify which medications would be in the pack.  Medications were reviewed with the pharmacy technician.  Patient's last pack was filled 12/23/16 and was delivered on 12/24/16.  Plan:  Follow up with the patient's daughter to be sure everything is on schedule. Close pharmacy case.  Elayne Guerin, PharmD, [place] Clinical Pharmacist [phone]

## 2017-01-25 ENCOUNTER — Inpatient Hospital Stay
Admission: RE | Admit: 2017-01-25 | Discharge: 2017-01-25 | Disposition: A | Discharge status: Home / Self Care | Payer: Medicare HMO | Source: Ambulatory Visit | Attending: Family Medicine | Admitting: Family Medicine

## 2017-02-24 ENCOUNTER — Other Ambulatory Visit: Payer: Medicare HMO

## 2017-03-22 ENCOUNTER — Inpatient Hospital Stay: Admission: RE | Admit: 2017-03-22 | Payer: Medicare HMO | Source: Ambulatory Visit

## 2017-03-22 ENCOUNTER — Ambulatory Visit
Admission: RE | Admit: 2017-03-22 | Discharge: 2017-03-22 | Disposition: A | Discharge status: Home / Self Care | Payer: Medicare HMO | Source: Ambulatory Visit | Attending: Family Medicine | Admitting: Family Medicine

## 2017-03-22 DIAGNOSIS — Z780 Asymptomatic menopausal state: Secondary | ICD-10-CM | POA: Diagnosis not present

## 2017-03-22 DIAGNOSIS — M810 Age-related osteoporosis without current pathological fracture: Secondary | ICD-10-CM | POA: Diagnosis not present

## 2017-03-23 ENCOUNTER — Telehealth: Payer: Self-pay | Admitting: Family Medicine

## 2017-03-23 NOTE — Telephone Encounter (Signed)
Left message for patient son to return call

## 2017-03-23 NOTE — Telephone Encounter (Signed)
Please call pt, or her contact person depending upon her preference. Patient's osteoporosis has progressed over the last few years.  - Patient has been discouraged by dentist on taking bisphosphonate secondary to possibility of osteonecrosis given her periodontal disease. - Her oncologist has offered her prolia injections in the past. Although, there still is a small chance of osteonecrosis with this medication, it is less likely and with the other medications available.  - Patient and family will need to weigh the benefits of the medication helping to strengthen her bones and hopefully helping her avoid fracture if she falls versus potential development of adverse effect. They would like to start injections, they are every 6 months and we can provide this for her.

## 2017-03-24 ENCOUNTER — Encounter: Payer: Self-pay | Admitting: *Deleted

## 2017-03-24 ENCOUNTER — Other Ambulatory Visit: Payer: Self-pay | Admitting: Pharmacist

## 2017-03-24 ENCOUNTER — Other Ambulatory Visit: Payer: Self-pay | Admitting: *Deleted

## 2017-03-24 MED ORDER — ASPIRIN 81 MG PO TABS: 81.0000 mg | ORAL_TABLET | Freq: Every day | ORAL | 3 refills | Status: DC

## 2017-03-24 NOTE — Telephone Encounter (Signed)
Spoke with patients son reviewed information he will speak with his sister and let us know if they would like to start prolia injections.

## 2017-03-24 NOTE — Patient Outreach (Signed)
[facility]) Care Management  03/24/2017  Gabrielle [name] 06/22/1930 [id number]   Called patient's daughter, son and [place] in reference to the patient's pill packs. HIPAA identifiers were obtained.  Patient's son said the pill packs have been working fine but there was a delay last month. [place] could not explain the delay but promised to have the patient's medications sent to her either today or tomorrow. Patient's daughter said she would follow up with [facility] on where they would like the packs delivered as the patient has a PO box.  Plan: Patient's pharmacy case will be closed. The patient and her children have my number for future medication questions or concerns.  Elayne Guerin, PharmD, [place] Clinical Pharmacist [phone]

## 2017-05-04 ENCOUNTER — Telehealth: Payer: Self-pay | Admitting: *Deleted

## 2017-05-04 ENCOUNTER — Encounter: Payer: Self-pay | Admitting: *Deleted

## 2017-05-04 NOTE — Telephone Encounter (Signed)
Copied from [place] [id number]. Topic: Inquiry >> May 04, 2017  9:13 AM Corie Chiquito, NT wrote: Reason for CRM: Patient daughter calling because her mother needs to have a prolia injection. If someone could give her a call back about this at [phone]   Patient daughter has questions about the risk of her mother getting osteonecrosis with prolia injections she says she doesn't feel like she has enough education on this to make a decision and would like recommendation from Dr Raoul Pitch if her mother should start this. She said her mother is a heavy smoker and would like to know if this would also cause an issue with the prolia. Please advise.

## 2017-05-04 NOTE — Telephone Encounter (Signed)
Risk of osteonecrosis in the general population is <1%. However the risk is increased in older females with poor dental hygiene or cancer. There is not a calculator to identify her exact percentile of risk of her developing jaw necrosis from the use of prolia.  Her risk factor for a broken bone, from her osteoporosis, is greater than 3% for a hip and > 20% (in ten years) for other major fracture. There is likely more of risk she would have a fracture than there is jaw necrosis, but that can not be guaranteed.

## 2017-05-04 NOTE — Telephone Encounter (Signed)
Spoke with [name] patients daughter she says she will speak with her mom about this and she will let us know if they decide to proceed she is concerned about side effects and states it will be up to her Mom.

## 2017-05-23 ENCOUNTER — Telehealth: Payer: Self-pay | Admitting: Family Medicine

## 2017-05-23 ENCOUNTER — Telehealth: Payer: Self-pay | Admitting: Internal Medicine

## 2017-05-23 NOTE — Telephone Encounter (Signed)
I do not mind prescribing the aricept if she follows routinely every 6 months. She would need an appt to discuss and me to take over.  I do however think there is some benefit to keeping her established with neurology if memory continues to decline. Up to them ... appt here if wanting [place] to manage.

## 2017-05-23 NOTE — Telephone Encounter (Signed)
Spoke with patient's daughter. She was confused about patient's medications. She wanted to know if the patient needed to be using Spiriva each day. Advised her that per MR's last OV, yes she does.   She then stated that the pharmacy had stopped giving her the Spiriva and that's why she had stopped taking it.   [place] and spoke with [place]. She stated that they have tried numerous times to deliver the medications, but the patient has refused the Spiriva, possibly because of the $45 copay.   Advised the daughter of what [facility] said. Daughter was not happy. Advised the daughter that perhaps the patient needs to switch to another pharmacy. She agreed. Nothing else needed at time of call.

## 2017-05-23 NOTE — Telephone Encounter (Signed)
Copied from [place] [id number]. Topic: Inquiry >> May 23, 2017 10:31 AM Conception Chancy, NT wrote: Patient daughter is calling and is requesting [name] nurse contact her. She states she has questions about donepezil (ARICEPT) 10 MG tablet. She is aware that [name] does not prescribe this and would like to know could her neurologist stop prescribing this and [name] take over. Please advise.

## 2017-05-24 ENCOUNTER — Other Ambulatory Visit: Payer: Self-pay | Admitting: Pharmacist

## 2017-05-24 NOTE — Telephone Encounter (Signed)
Spoke with patient daughter they will stay with Neurology for her aricept. Daughter will call back and schedule an appt for 6 month follow up appt for her routine check.

## 2017-05-24 NOTE — Telephone Encounter (Signed)
Tried to call patient daughter. Will try again today to contact her.

## 2017-05-24 NOTE — Patient Outreach (Signed)
[facility]) Care Management  05/24/2017  ARZELLA REHMANN 1930-05-29 [id number]   Patient's daughter called and expressed concerns about her mother's medications. HIPAA identifiers were obtained. Patient has been receiving her medications in pill packing from [facility].  Unfortunately, the patient's daughter [name]) said they have had continued issues getting her mother's medications. She wondered if there were other pharmacies in the area that provide pill packing.  [facility], [facility] and [facility] were offered as resources. [name] chose [facility].  [facility] was called on the patient's behalf and the process of transferring the patient's profile was initiated.  The Pharmacist, Ronalee Belts said he would get everything started and turn it over the to technician who handles the pill packs, Raquel Sarna.  [employer] was provided Tracy's number and my number for follow up.  The patient cannot have anything filled for the next couple of weeks as all of her medications were filled at [facility] recently.  Plan: Follow up with [facility] and the patient's daughter in 2 weeks.  Elayne Guerin, PharmD, [facility] Clinical Pharmacist [phone]

## 2017-05-30 ENCOUNTER — Ambulatory Visit: Payer: Medicare HMO | Admitting: Neurology

## 2017-05-30 ENCOUNTER — Encounter: Payer: Self-pay | Admitting: Neurology

## 2017-05-30 VITALS — BP 110/70 | HR 87 | Ht 64.0 in | Wt 104.1 lb

## 2017-05-30 DIAGNOSIS — F423 Hoarding disorder: Secondary | ICD-10-CM

## 2017-05-30 DIAGNOSIS — F17210 Nicotine dependence, cigarettes, uncomplicated: Secondary | ICD-10-CM | POA: Diagnosis not present

## 2017-05-30 DIAGNOSIS — F0151 Vascular dementia with behavioral disturbance: Secondary | ICD-10-CM | POA: Diagnosis not present

## 2017-05-30 DIAGNOSIS — Z720 Tobacco use: Secondary | ICD-10-CM | POA: Diagnosis not present

## 2017-05-30 DIAGNOSIS — F01518 Vascular dementia, unspecified severity, with other behavioral disturbance: Secondary | ICD-10-CM

## 2017-05-30 MED ORDER — DONEPEZIL HCL 10 MG PO TABS
ORAL_TABLET | ORAL | 3 refills | Status: DC
Start: 2017-05-30 — End: 2018-06-26

## 2017-05-30 NOTE — Progress Notes (Signed)
Follow-up Visit   Date: 05/30/17    Gabrielle [name] MRN: [id number] DOB: 1930/03/17   Interim History: Gabrielle [name] is a 84 y.o. right-handed Caucasian female with history of tobacco use returning to the clinic for follow-up of vascular dementia.  The patient was accompanied to the clinic by son, Gabrielle [name]), who also provides collateral information.    History of present illness: Since January 2015, her son noticed changes in her mother's cognitive ability. She has been having problems with short-term memory. She is highly functioning at baseline and works part-time at [employer] and has not noticed any problems at work. Problems are mostly with short-term memory such as remembering names, dates, appointments and she has started to write things down more.  She is driving without any difficulty. She denies getting lost or being involved in any car accidents. She cooks for herself and eats 2-3 meals per day. Her son says that her response is slower, such as when talking in conversation. Her mood is good. Sleep is fair, averaging 6-7 hours per night. No bizarre or inappropriate behavior changes. She has not had regular medical care since 2000.  Of note, she has lost 50lb unintentionally in the past 4-month. Appetite remains good.   She was diagnosed with breast cancer in September 2015 and underwent mastectomy and chemotherapy.  During the same time, she reported having greater difficulty with learning new tasks and short-term memory.  She has difficulty maintaining her mail, e-mails, lost her checkbook in her mail, and daughter says her home is in disarray.  Since her last visit, her daughter contacted our office with concerns that patient was ordering $1200 of online magazine which is atypical for her.  Neuropsychology testing showed evidence of vascular dementia (mild).  Despite recommendation to stop working by her son and daughter, patient continue to file taxes and works for [employer] as  well as drive locally.  In 2017, she had three active fraud investigations regarding her checking account, [employer] visa card, and [employer] checking account.  Her computer has been hacked and has multiple viruses because she opens emails from people that she does not know.  They are concerned that she is also taking phone calls and agreeing to certain transactions, unknowingly by scam artists.  Her son (POA) has taken greater responsibility over her mail and finances.  She continues to live alone and family has noticed that she is hoarding mail and not able to keep up with cleanliness of her home.  She feels the need to open every article of mail that is delivered and has signed up for numerous newsletters and promotions. Her son has tried very hard to take control of her finances and confiscated her computer.  However, she still has her phone and ipad.   She did not see psychiatry as per my last recommendations, as much of her issues are behavorial.    UPDATE 05/30/2017:  She is here for 1 year appointment and overall, her cognition has remained stable.  She did not return to [employer] to file taxes this year.  Her son tells me that patient has not filed taxes for 2016-2018, which is admits but is not concerned about. She is still driving and there are no safety concerns.  She takes her own medications which are pill packed.  She continues to hoard items in her home such as magazines.  Family has got her an iphone with tracking, so they can see where she is  at, as she does not always answer her phone.    Medications:  Current Outpatient Medications on File Prior to Visit  Medication Sig Dispense Refill  . aspirin 81 MG tablet Take 1 tablet (81 mg total) by mouth daily. 90 tablet 3  . Cholecalciferol 2000 units CAPS Take 1 capsule (2,000 Units total) by mouth daily. (Patient taking differently: Take 5,000 Units by mouth daily. ) 90 each 3  . Multiple Vitamins-Minerals (PRESERVISION AREDS 2 PO) Take 1 tablet  by mouth 2 (two) times daily.    . Tiotropium Bromide Monohydrate (SPIRIVA RESPIMAT) 2.5 MCG/ACT AERS Inhale 2 puffs into the lungs daily. 1 Inhaler 5  . vitamin B-12 (CYANOCOBALAMIN) 100 MCG tablet Take 1 tablet (100 mcg total) by mouth daily. 90 tablet 3  . anastrozole (ARIMIDEX) 1 MG tablet Take 1 tablet (1 mg total) by mouth daily. (Patient not taking: Reported on 05/30/2017) 90 tablet 3  . mirtazapine (REMERON) 7.5 MG tablet Take 7.5 mg by mouth at bedtime.     No current facility-administered medications on file prior to visit.     Allergies: No Known Allergies   Review of Systems:  CONSTITUTIONAL: No fevers, chills, night sweats, weight loss.   EYES: No visual changes or eye pain ENT: No hearing changes.  No history of nose bleeds.   RESPIRATORY: No cough, wheezing and shortness of breath.   CARDIOVASCULAR: Negative for chest pain, and palpitations.   GI: Negative for abdominal discomfort, blood in stools or black stools.  No recent change in bowel habits.   GU:  No history of incontinence.   MUSCLOSKELETAL: No history of joint pain or swelling.  No myalgias.   SKIN: Negative for lesions, rash, and itching.   ENDOCRINE: Negative for cold or heat intolerance, polydipsia or goiter.   PSYCH:  No  depression or anxiety symptoms.   NEURO: As Above.   Vital Signs:  BP 110/70   Pulse 87   Ht '5\' 4"'  (1.626 m)   Wt 104 lb 2 oz (47.2 kg)   SpO2 93%   BMI 17.87 kg/m   Neurological Exam:  MENTAL STATUS:   Montreal Cognitive Assessment  05/30/2017 09/25/2015 07/25/2014 12/13/2013 10/08/2013  Visuospatial/ Executive (0/5) '4 4 3 4 2  ' Naming (0/3) '2 3 2 3 2  ' Attention: Read list of digits (0/2) '1 1 2 2 2  ' Attention: Read list of letters (0/1) '1 1 1 1 1  ' Attention: Serial 7 subtraction starting at 100 (0/3) '1 3 3 3 1  ' Language: Repeat phrase (0/2) '2 2 2 2 2  ' Language : Fluency (0/1) 0 0 0 0 0  Abstraction (0/2) '2 2 2 2 2  ' Delayed Recall (0/5) '4 4 3 2 3  ' Orientation (0/6) '6 6 6 5 5    ' Total '23 26 24 24 20  ' Adjusted Score (based on education) '23 26 24 25 20   ' Awake, flat affect, oriented to person, place, date, year.  Poor spontaneous speech  Poor eye contact.   Impaired judgement.   CRANIAL NERVES: Pupils equal round and reactive to light.   Face is symmetric.   MOTOR:  Motor strength is 5/5 in all extremities.     COORDINATION/GAIT:    Stooped posture, gait narrow based and stable.   Data: Labs 11/26/2015:  RPR NR, vitamin E 1.0, TSH 1.6, vitamin B1 13, vitamin B12 >1500, ESR 13  MRI brain 01/22/2014: No acute infarct. Remote medial right thalamic infarct which was partially hemorrhagic with presence  of blood breakdown products. Remote tiny inferior cerebellar infarct. Mild to moderate small vessel disease type changes. No intracranial mass or bony destructive lesion to suggest the presence of intracranial metastatic disease. Global atrophy without hydrocephalus.  Right vertebral artery and right posterior inferior cerebellar artery may be occluded.  MRI brain wwo contrast 12/17/2015: 1.  No acute intracranial abnormality. 2. Advanced chronic small vessel disease, with progression in both thalami since 2015. 3. Chronic occlusion of the distal right vertebral artery again suspected.  Neuropsychiatry testing at [facility] January 2016:  Neuropsychology testing showed evidence of vascular dementia (mild) Neuropsychology testing August 2017:  Anxiety disorder, cognitive disorder deferred   IMPRESSION: Gabrielle [name] is 84 year old female with returning with dementia with behavior changes. Interestingly, her neuropsychological testing from August 2017 did not show evidence of cognitive disorder.  She has a lot of obsessive compulsive and hoarding behavior, but refuses to see psychiatry. She finally stopped working for [employer].  Son recently found out that she has not filed her personal taxes for 2016-2018.  I have encouraged her to seek help from an tax specialist to  work through these issues.  Overall, there has been no significant change in her memory or behavior, so will continue to hold off on CSF testing to look for encephalitis.  Continue aspirin 46m and aricept 113mdaily.  Tobacco history of cancer.  Currently smoking 1 packs/day.  Patient was informed of the dangers of tobacco abuse including stroke, cancer, and MI, as well as benefits of tobacco cessation. Patient is not willing to quit at this time.  Approximately 4 mins were spent counseling patient cessation techniques. We discussed various methods to help quit smoking, including deciding on a date to quit, joining a support group, pharmacological agents- nicotine gum/patch/lozenges, chantix. I will reassess her progress at the next follow-up visit  Return to clinic in 1 year  Greater than 50% of this 25 minute visit was spent in counseling, explanation of diagnosis, planning of further management, and coordination of care.    Thank you for allowing me to participate in patient's care.  If I can answer any additional questions, I would be pleased to do so.    Sincerely,    Demarri Elie K. PaPosey ProntoDO

## 2017-05-30 NOTE — Patient Instructions (Signed)
Encouraged to stop smoking  Work with an Optometrist to file your taxes  Return to clinic 1 year

## 2017-06-01 ENCOUNTER — Ambulatory Visit (INDEPENDENT_AMBULATORY_CARE_PROVIDER_SITE_OTHER)
Admission: RE | Admit: 2017-06-01 | Discharge: 2017-06-01 | Disposition: A | Discharge status: Home / Self Care | Payer: Medicare HMO | Source: Ambulatory Visit | Attending: Internal Medicine | Admitting: Internal Medicine

## 2017-06-01 DIAGNOSIS — J181 Lobar pneumonia, unspecified organism: Secondary | ICD-10-CM | POA: Diagnosis not present

## 2017-06-01 DIAGNOSIS — J189 Pneumonia, unspecified organism: Secondary | ICD-10-CM

## 2017-06-02 ENCOUNTER — Ambulatory Visit: Payer: Medicare HMO | Admitting: Internal Medicine

## 2017-06-02 ENCOUNTER — Ambulatory Visit: Payer: Medicare HMO | Admitting: Family Medicine

## 2017-06-02 ENCOUNTER — Encounter: Payer: Self-pay | Admitting: Internal Medicine

## 2017-06-02 VITALS — BP 108/62 | HR 75 | Ht 64.0 in | Wt 102.2 lb

## 2017-06-02 DIAGNOSIS — R918 Other nonspecific abnormal finding of lung field: Secondary | ICD-10-CM | POA: Diagnosis not present

## 2017-06-02 DIAGNOSIS — J449 Chronic obstructive pulmonary disease, unspecified: Secondary | ICD-10-CM

## 2017-06-02 DIAGNOSIS — R911 Solitary pulmonary nodule: Secondary | ICD-10-CM | POA: Diagnosis not present

## 2017-06-02 NOTE — Patient Instructions (Signed)
COPD, moderate ([place])   - stable  - continue spiriva daily  Lung nodule - improved; do repeat ct chest wo contrast in 1 year   Followup 1 year or sooner if needed

## 2017-06-02 NOTE — Progress Notes (Signed)
Subjective:     Patient ID: Gabrielle [name], female   DOB: Aug 21, 1930, 84 y.o.   MRN: [id number]  HPI   Gabrielle [name] is a 84 y.o. female current every day  smoker  with pneumonia and COPD. She is followed by Gabrielle. Birdena [name].    IOV 12/07/2013  Chief Complaint  Patient presents with  . Pulmonary Consult    Pt referred by Gabrielle. Cathie [name] for COPD.     84 year old female. 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 [place] aunt of GI doc Gabrielle [name]. Gabrielle Gabrielle [name] and daughter Gabrielle [name] had briefed me about patient prior to visit.  She has had 50# weight loss per hx x 12 months (per Gabrielle Gabrielle [name] notes 12/05/13 no documented loss per epic) or so with some general decline in ? Cognition.  She saw cardiology 11/07/13 and cxr showed emphysema. Resulted in CT Chest 11/19/13 that showed  - 1cm Rt breast mass    - subseuqntly seen Gabrielle Gabrielle [name] 12/05/13 :  triple postive breast cancer - await surgery    - per Daughter's email to me   Yesterday .     Mom has invasive ductal breast cancer . Tumor is 2.1-3.0 cm . cancer is grade 2, stage 1B to stage 2. . Proliferation factor 31% . no evidence cancer has spread to lymph nodes . it is estrogen, progesterone and HER2 positive. . Current treatment plan:  mastectomy of right breast, followed by Anastrozole and Herceptin.   Prognosis:  the physicians believe there is a high probability the surgery and medications will cure the cancer.      - 80m RLL nodule  - severe emphysema +  So she has been referred here  She says that leading up to this fall 2015 - absolutely no dyspnea, cough, chest tightness. But now some cough with nasal congestion. In fact, COPD CAT Score below shows mild cough and sputum with very little dyspnea. Fatigue seeems main issue. COPD CAT score is 14 and reflects only mild symptoms burden.   Walk test 185 feet x 3 laps: did not desaturate and denies dysppnea  sPirometry today: fev1 1.5L/74%, Ratio 63  cw  MILDER FORM OF GOLD STAGE 2  COPD   Daughter has sseveral question on email   - addressed in plan   OV 09/23/2016   Chief Complaint  Patient presents with  . Follow-up    Pt last seen in 11/2013. Pt here today for abnormal CT chest. Gabrielle. AHavery [name] pt to f/u back up with pulmonary. Pt c/o DOE, prod cough with clear mucus. Pt denies CP/tightness and f/c/s.     .84year old female. Accompanied by her daughter TOlivia [name] Patient lives alone. Last seen almost 3 years ago for COPD Gold stage II and ongoing smoking and preoperative clearance and unexplained weight loss. Since then she's not followed up. According to the daughter and patient is continued to lose weights several pounds since then. It has accelerated in the last several months. Apparently an extensive workup was done and a CT scan a week ago 09/15/2016 showed left lower lobe pneumonia and pulmonary hypertension therefore they've back here. Patient continues to smoke. She reports ongoing weight loss. Ongoing cough and shortness of breath. She is no longer taking her Spiriva. The cough might be worse according to the daughter, the patient is unsure. Patient continues to live alone and take care of herself. Patient family is trying to get her into an assisted living but she is  resisting. Continues to smoke and will not quit. There is no report of dysphagia and apparently GI has cleared up. Her last visit to the Dentists was over a year ago. Patient denies any aspiration episodes or alcohol consumption.        10/18/2016 3 week Follow Up Visit: Pt. Returns for follow up. She was seen by Gabrielle. Chase [name] for pneumonia,COPD, 09/23/2016. She had a significant weight loss in the past few months. ColoScreen was positive for blood, and PCP refer to GI.  GI consult included CT scan of abdomen and pelvis and chest. CT chest had an incidental finding of aspiration pneumonia. She was referred to Gabrielle. Chase [name]  for evaluation. Plan after that visit was as follows:  COPD   -please restart spiriva respimat; wil help with cough - will discuss repeat PFT at followup  Pneumonia  - likely aspiration or due to poor dental hygienie  - continue augmentin as advised by GI but extend for 4 more days for total 14 days (will send Rx)  - talk to Gabrielle [name] if you could have swallowing difficulty  - definitely see dentist  - do followup CT chest wo contrast in 8 weeks  Smoking   Understand you enjoy it and wont quit  Followup  3 weeks with APP to report progress  8-10  weeks with me Gabrielle [name] but after CT chest/ Abdomen/ Pelvis.  Today's Follow Up:  Pt. Presents today stating she is much better after the Augmentin. Additionally after starting the Spiriva her cough has resolved. She denies fever, chest pain, orthopnea, or hemoptysis. Per the patient her secretions are clear to white. Per Gabrielle. Golden [name] suggestion the patient has been seen by a dentist, who felt there was no acute oral /dental  issues that could be causing a pneumonia. They have made recommendation that she have periodontal follow-up. Patient states she does not feel that she is aspirating, as she has no coughing while eating or drinking. We discussed the possibility of silent aspiration and that the only true evaluation would be a modified barium swallow. She prefers to wait until after the CT scan scheduled for 11/24/2016 to determine if she needs swallow evaluation.Patient weight today is 100 pounds. She states she is using meal supplements at bedtime.   OV 11/29/2016  Chief Complaint  Patient presents with  . Follow-up    Pt still has occ. SOB but not necessarily just with exertion and occ. cough with clear mucus. Denies any CP.     Gabrielle [name] is the aunt of GI Gabrielle Gabrielle [name].  she presents for several issues.   COPD: She's not taking her Spiriva inhaler  Daughter Gabrielle [name] with her. sHe tells me that during switch pharmacies the Spiriva that left out.   COPD is considered stable     smoking: She continues to smoke she enjoys it. Refuses to quit  - Lower lobe pneumonia: She had follow-up CT scan of the chest that shows significant improvement since July 2018. She still has some nodularity. She will need a repeat CT chest. She again denies any aspiration. The CT scan does show esophageal dysmotility. She will follow-up with GI. Daughter is asking about safety of doing endoscopy and colonoscopy.   -   OV 06/02/2017  Chief Complaint  Patient presents with  . Follow-up    Pt here after CT chest. Pt states overall she feels her breathing is stable. Pt c/o prod cough with yellow mucus x months. Pt denies CP/tightness and  f/c/s.      Gabrielle [name] is the [place] of Gabrielle [employer].    Fu copd and pulmonary infitlrate/nodule after pna. LAst visit July 2018. Overal lstable. Was nto taking spiriva because [facility] was not delivering consistently to her (wrong name, address etc., )Now back on spiriva though [place] and going to start. STabe. COntinues to smoker. CAT scorfe below shws stabiluty., Daughter tracy from [facility] here   CAT COPD Symptom & Quality of Life Score (GSK trademark) 0 is no burden. 5 is highest burden 12/07/2013  06/02/2017   Never Cough -> Cough all the time 3 3  No phlegm in chest -> Chest is full of phlegm 4 2  No chest tightness -> Chest feels very tight 0 1  No dyspnea for 1 flight stairs/hill -> Very dyspneic for 1 flight of stairs 2 Did nto answer  No limitations for ADL at home -> Very limited with ADL at home 0 0  Confident leaving home -> Not at all confident leaving home 0 0  Sleep soundly -> Do not sleep soundly because of lung condition 0 0  Lots of Energy -> No energy at all 5 5  TOTAL Score (max 40)  14      Ct Chest Wo Contrast  Result Date: 06/01/2017 CLINICAL DATA:  Left lower lobe pneumonia. EXAM: CT CHEST WITHOUT CONTRAST TECHNIQUE: Multidetector CT imaging of the chest was performed following the standard protocol without IV  contrast. COMPARISON:  11/24/2016. FINDINGS: Cardiovascular: The heart size is normal. No pericardial effusion. Coronary artery calcification is evident. Atherosclerotic calcification is noted in the wall of the thoracic aorta. Mediastinum/Nodes: Stable 10 mm short axis precarinal lymph node. Calcified lymph nodes seen in the left hilum. The esophagus has normal imaging features. There is no axillary lymphadenopathy. Lungs/Pleura: Centrilobular and paraseptal emphysema again noted. Patchy airspace disease seen previously posterior right upper lobe has clearly improved in the interval with some residual tree-in-bud nodularity, bronchiectasis, and small airway impaction identified posteriorly along the major fissure. Areas of mild bronchiectasis, airway impaction, and scarring in the right middle lobe are stable. There is bronchial wall thickening with mild bronchiectasis in the lower lungs bilaterally. 4 mm right lower lobe nodule (image 104/series 3) is stable since prior study and also comparing to 06/27/2014 consistent with benign etiology. Stable calcified granuloma in the lingula. Upper Abdomen: Stable adrenal thickening bilaterally, compatible with hyperplasia. Granulomas disease again noted in the spleen. Musculoskeletal: Bone windows reveal no worrisome lytic or sclerotic osseous lesions. IMPRESSION: 1. Continued interval improvement in multifocal pulmonary opacities. Residua identified in the posterior right upper lobe, right middle lobe, and to a modest degree in each lower lobe may reflect incomplete resolution, evolving scar, or recurrent disease. Features may be related to atypical infection although aspiration can have this appearance. 2.  Emphysema. (ICD10-J43.9) 3.  Aortic Atherosclerois (ICD10-170.0) Electronically Signed   By: Misty Stanley M.D.   On: 06/01/2017 15:17      has a past medical history of Anxiety, Breast cancer of upper-outer quadrant of right female breast ([place]) (10/2013),  Cataract, COPD (chronic obstructive pulmonary disease) ([place]), Macular degeneration, Osteoporosis (10/2014), Pneumonia, Retina hole, Skin cancer, Stroke ([place]), and Tobacco dependence.   reports that she has been smoking cigarettes.  She has a 30.00 pack-year smoking history. She has never used smokeless tobacco.  Past Surgical History:  Procedure Laterality Date  . ABDOMINAL HYSTERECTOMY  1980   (ovaries intact)  . APPENDECTOMY    .  BREAST BIOPSY Right   . CATARACT EXTRACTION Bilateral   . MASTECTOMY Right   . MOHS SURGERY     Gabrielle. Sarajane Jews  . PARS PLANA VITRECTOMY W/ REPAIR OF MACULAR HOLE Right   . RETINAL LASER PROCEDURE    . TOTAL MASTECTOMY Right 12/18/2013   Procedure: RIGHT TOTAL MASTECTOMY;  Surgeon: Excell Seltzer, MD;  Location: [facility];  Service: General;  Laterality: Right;    No Known Allergies  Immunization History  Administered Date(s) Administered  . Influenza, High Dose Seasonal PF 11/29/2016  . Influenza,inj,Quad PF,6+ Mos 11/21/2013, 12/19/2014  . PPD Test 09/20/2016  . Pneumococcal Conjugate-13 11/21/2013  . Pneumococcal Polysaccharide-23 11/29/2016  . Td 12/21/2016    Family History  Problem Relation Age of Onset  . Heart attack Mother        Deceased  . Cancer Mother        fallopian tube cancer in 54s; deceased 68  . Throat cancer Father        Deceased 51; smoker  . Diabetes Brother   . Prostate cancer Brother 47       Currently 57  . Other Brother        Deceased, hemorhhage of pancreas  . Leukemia Maternal Aunt   . Breast cancer Paternal Aunt        age at diagnosis unknown  . Throat cancer Paternal Uncle        Deceased 72s; smoker  . Leukemia Paternal Grandmother      Current Outpatient Medications:  .  anastrozole (ARIMIDEX) 1 MG tablet, Take 1 tablet (1 mg total) by mouth daily., Disp: 90 tablet, Rfl: 3 .  aspirin 81 MG tablet, Take 1 tablet (81 mg total) by mouth daily., Disp: 90 tablet, Rfl: 3 .  Cholecalciferol 2000 units CAPS,  Take 1 capsule (2,000 Units total) by mouth daily. (Patient taking differently: Take 5,000 Units by mouth daily. ), Disp: 90 each, Rfl: 3 .  donepezil (ARICEPT) 10 MG tablet, Take 1 tablets daily., Disp: 90 tablet, Rfl: 3 .  mirtazapine (REMERON) 7.5 MG tablet, Take 7.5 mg by mouth at bedtime., Disp: , Rfl:  .  Multiple Vitamins-Minerals (PRESERVISION AREDS 2 PO), Take 1 tablet by mouth 2 (two) times daily., Disp: , Rfl:  .  Tiotropium Bromide Monohydrate (SPIRIVA RESPIMAT) 2.5 MCG/ACT AERS, Inhale 2 puffs into the lungs daily., Disp: 1 Inhaler, Rfl: 5 .  vitamin B-12 (CYANOCOBALAMIN) 100 MCG tablet, Take 1 tablet (100 mcg total) by mouth daily., Disp: 90 tablet, Rfl: 3    Review of Systems     Objective:   Physical Exam Vitals:   06/02/17 1205  BP: 108/62  Pulse: 75  SpO2: 96%  Weight: 102 lb 3.2 oz (46.4 kg)  Height: '5\' 4"'  (1.626 m)    Body mass index is 17.54 kg/m.  Frail female Alert and oreinetd x 3 Clear lungs Normal heart sounds No edema    Assessment:       ICD-10-CM   1. COPD, moderate ([place]) J44.9   2. Lung nodule R91.1        Plan:     COPD, moderate ([facility])   - stable  - continue spiriva daily  Lung nodule - improved; do repeat ct chest wo contrast in 1 year   Followup 1 year or sooner if needed   Gabrielle. Brand Males, M.D., [facility].C.P Pulmonary and Critical Care Medicine Staff Physician, [place] Director - Interstitial Lung Disease  Program  [facility] -  [place] [facility] [place], [place], [address]  Pager: [phone], If no answer or between  15:00h - 7:00h: call [phone] Telephone: [phone]

## 2017-06-06 ENCOUNTER — Encounter: Payer: Self-pay | Admitting: Family Medicine

## 2017-06-07 ENCOUNTER — Ambulatory Visit: Payer: Self-pay | Admitting: Pharmacist

## 2017-06-07 ENCOUNTER — Other Ambulatory Visit: Payer: Self-pay | Admitting: Pharmacist

## 2017-06-07 NOTE — Patient Outreach (Signed)
[facility]) Care Management  06/07/2017  Gabrielle [name] 01-30-1931 [id number]   Patient's daughter was called to follow on pill pack referral.  Unfortunately, she did not answer the phone. HIPAA compliant message was left on the patient's daughter's voicemail.  [place] was called to check on the progress of the pill packs. I spoke with Raquel Sarna who confirmed the patient will receive her pills packs next week and that she has had several in depth conversations with the patient's daughter as well.  Plan: Since Raquel Sarna said the packs will be delivered next week, I will reach out to the patient's daughter next week to be sure all is well.   Elayne Guerin, PharmD, [place] Clinical Pharmacist [phone]

## 2017-06-10 ENCOUNTER — Ambulatory Visit (INDEPENDENT_AMBULATORY_CARE_PROVIDER_SITE_OTHER): Payer: Medicare HMO | Admitting: Family Medicine

## 2017-06-10 ENCOUNTER — Encounter: Payer: Self-pay | Admitting: Family Medicine

## 2017-06-10 VITALS — BP 114/62 | HR 69 | Temp 97.9°F | Resp 20 | Ht 64.0 in | Wt 103.5 lb

## 2017-06-10 DIAGNOSIS — Z7185 Encounter for immunization safety counseling: Secondary | ICD-10-CM

## 2017-06-10 DIAGNOSIS — M810 Age-related osteoporosis without current pathological fracture: Secondary | ICD-10-CM

## 2017-06-10 DIAGNOSIS — M8000XA Age-related osteoporosis with current pathological fracture, unspecified site, initial encounter for fracture: Secondary | ICD-10-CM | POA: Diagnosis not present

## 2017-06-10 DIAGNOSIS — R64 Cachexia: Secondary | ICD-10-CM

## 2017-06-10 DIAGNOSIS — Z7189 Other specified counseling: Secondary | ICD-10-CM | POA: Diagnosis not present

## 2017-06-10 DIAGNOSIS — F0150 Vascular dementia without behavioral disturbance: Secondary | ICD-10-CM

## 2017-06-10 MED ORDER — MIRTAZAPINE 7.5 MG PO TABS: 7.5000 mg | ORAL_TABLET | Freq: Every day | ORAL | 1 refills | Status: DC

## 2017-06-10 NOTE — Progress Notes (Signed)
Patient ID: Gabrielle [name], female   DOB: 10-07-1930, 84 y.o.   MRN: [id number]    Gabrielle [name] , 07/22/1930, 84 y.o., female MRN: [id number]  CC: vascular dementia Patient Care Team    Relationship Specialty Notifications Start End  Ma Hillock, DO PCP - General Family Medicine  08/01/15   Excell Seltzer, MD Consulting Physician General Surgery  11/29/13   Magrinat, Virgie Dad, MD Consulting Physician Oncology  11/29/13   Arloa Koh, MD Consulting Physician Radiation Oncology  11/29/13   Jarome Matin, MD Consulting Physician Dermatology  06/11/15   Brand Males, MD Consulting Physician Pulmonary Disease  06/11/15   Alda Berthold, DO Consulting Physician Neurology  08/01/15   Armbruster, Carlota Raspberry, MD Consulting Physician Gastroenterology  12/06/16   Elayne Guerin, [facility] Management Pharmacist  05/24/17     Subjective: Vascular dementia/weight loss/osteoporosis/vaccinations:  Patient presents to the appointment today with her daughter.  Review of history completed.  She has recently followed up with her neurologist.  She is continuing on the Aricept.  Her weight is basically unchanged.  She is not taking the Remeron as directed because she states it made her tired.  She has had the shingrix #1 completed January of this year, she is due for the second Shingrix.  They again had questions surrounding Prolia injections for her osteoporosis.  Overall patient feels like she is doing well.  She is eating 3 meals a day.  Has followed up with gastroenterology and pulmonology.  Family has found out she has not filed her taxes in the last couple years, but they are working to correct that. Prior note: Patient and her family have now met with the social worker, he has recommended assisted living facility long-term. Patient is agreeable to consider this, and they have brought an FL2  form with them today to be completed and will need a TB test. They're planning to place her in  a assisted living facility. Patient reports she has been taking the Remeron 7.5 mg, and it is not making her sleepy throughout the night. She again is about the same weight from her last visit 100 pounds to 99.25 pounds. She is not drinking the ensure as recommended. Her daughter states she eats really good when there is food in front of her, she just does not think that her mother goes and gets food to eat on her own. She also had a positive: Guard, and spoke with GI. They proceeded with a CT abdomen and chest, which was reassuring from the abdomen perspective, but resulted with potential aspiration pneumonia which they are treating with Augmentin. CT chest also showed evidence of potential pulmonary hypertension, borderline enlarged mediastinal lymph nodes largest 15 mm, emphysema Patient states she feels fine, she's had a cough for years. She has follow-up with her pulmonologist scheduled this week.    Prior note: Patient presents with her son and daughter today. Patient family had requested to meet a separate room prior to patient's appointment which was completed today. They have many concerns over their mother's safety, and are not certain on how to pursue with potential placement for her. They feel her memory loss and issue, that does not seem to be worsening but is not improving. They feel that she will be unwilling to go to a an assisted living facility, but they do not feel she has great decision-making capacity. They report she is still attempting to get on the computer, and make  stock market trades. She is occasionally still preparing peoples taxes. She is still driving, despite being discouraged to do so by her neurologist. She continues to lose weight, but she states she is eating 3 meals a day. Her family does not think she is eating routinely. She stopped taking the ensure supplement which was encouraged to times a day because she did not feel was helpful. She stopped taking the Remeron nightly,  because she felt it made her too tired. The family has had to stop the mail delivery to the home, because she was writing checks to take charities in the amounts of thousands of dollars. She is hoarding all magazines and mail. They state that there is only a path to walk within the home which is a safety risk. They report she goes out to smoke in the garage, that that has stacks of paper mail and gasoline. Patient is a breast cancer survivor patient 35, currently on anastrozole. She follows routinely with oncology, last appointment October 2017. A mammogram was ordered at that time, patient reports they did not call her to schedule. Her family states she does not pick up the phone and routinely forgets appointments. She has never had a colonoscopy. She denies bowel changes melena or hematochezia. She has a history of lung nodule with COPD/emphysema. She is still smoking. She is established with pulmonology Dr. Chase Caller. She has not followed up since October 2015, and reportedly had a low suspicious 4 mm right lower lung nodule at that time. Repeat CT chest April 2016 did not show evidence of metastatic disease. Patient denies any shortness of breath, cough or hemoptysis. She denies any systemic symptoms such as abdominal pain, early satiety, GI disturbances or night sweats. Patient has refused psychiatry referrals by neurology. They also reports she does not take medications that she is prescribed. MRI 12/17/2015 resulted with evidence of advanced chronic small vessel disease with progression in both thalami since 2015.  Prior note:  Patient presents with her son [name]) today to discuss her vascular dementia. Patient's son and daughter have concerns about her mother's safety, and progression in her dementia. This is the first time I have met the patient. Patient states she is following with her oncologist for her breast cancer history. She is due to follow back up with him after her mammogram in October,  and then a yearly basis. He would like to start Reclast for her osteoporosis but favor waiting until after she has her dental work completed. Patient seemed to be confused on when her follow-up should be and at the start of the medication for her osteoporosis. She currently continues to take anastrozole. Patient reports she has not followed up with neurologist as planned because she was fearful she was going to have her license taken away and she did not like the they had recommended she stop working. She was also seen by neuropsychologist which to the imaging study felt that she had vascular dementia and has suffered from 2 prior strokes. Family made concerning bringing her in today is to attempt to discuss with her and encourage her to be agreeable to see the neurologist again. Patient states that she is no longer taking the Aricept because she didn't see any difference anyway. Patient does admit to being more forgetful, but does not see this as a issue. She feels that she is normal forgetfulness secondary to her age. Her son reports that the house is "filling up with mail ". He states that his mother has  a hard time throwing away mail until it is open, however she is not opening female in a timely manner. Bills are becoming late and the house is becoming cluttered with an open mail. He states that there is mail it has been there for at least a year. He feels it's even potentially a fire hazard. The patient states that she does not want throughout the mail because be something important there. She gets anxious when the idea of cleaning out the home and mail is mentioned. Patient's family states that she gets mail and other family members house because she signed up for [employer] different mailing list. Her son also reports that there are some financial concerns. He feels that she has to open with her and antral portfolio with people that she should not be sharing that information with. He also states that she  gives lots of her money to charity, and finds that the charity are nonexistent/false charities. Another example is she recently went on a road trip with her brother to [facility], of which she is not able to tell any major details. Patient spends theses locked out of the majority of her online accounts because she changes the passwords and then forgets them. She states she wrote them down but lost the note book. She denies any falls in the home, but had recently tripped over pipe in the yard at [employer]. She reports she does cook for herself, and her son doesn't place any safety concerns surrounding her well-appearing her own meals. She lives alone and unassisted, and does not have routine family check ins. Her son is fearful she is going to get in the car and either get in an accident, or get lost and nobody will know for a few days.  She is losing weight, however her weight has been stable over the last year. Her BMI is 17.6. Her son states that she usually just eats one item as a meal, i.e. mashed potatoes. She does not take any nutrition supplements.  Fall Risk  05/30/2017 12/06/2016 10/29/2016 08/19/2016 08/06/2016  Falls in the past year? _0   Number falls in past yr: - - - - -  Injury with Fall? - - - - -  Risk for fall due to : - - - Mental status change -  Follow up - - - - -    Depression screen [facility] 2/9 06/10/2017 12/06/2016 10/29/2016 08/19/2016 08/06/2016  Decreased Interest 0 0 0 0 0  Down, Depressed, Hopeless 0 1 3 0 0  PHQ - 2 Score 0 1 3 0 0  Altered sleeping - - 0 - -  Change in appetite - - 3 - -  Feeling bad or failure about yourself  - - 3 - -  Trouble concentrating - - 0 - -  Moving slowly or fidgety/restless - - 0 - -  Suicidal thoughts - - 0 - -  PHQ-9 Score - - 3 - -  Difficult doing work/chores - - Not difficult at all - -   Functional Status Survey: Is the patient deaf or have difficulty hearing?: No Does the patient have difficulty seeing, even  when wearing glasses/contacts?: No Does the patient have difficulty concentrating, remembering, or making decisions?: Yes Does the patient have difficulty walking or climbing stairs?: No Does the patient have difficulty dressing or bathing?: No Does the patient have difficulty doing errands alone such as visiting a doctor's office or shopping?: Yes   No  Known Allergies Social History   Tobacco Use  . Smoking status: Current Every Day Smoker    Packs/day: 0.50    Years: 60.00    Pack years: 30.00    Types: Cigarettes  . Smokeless tobacco: Never Used  . Tobacco comment: 1/2 ppd 4.4.2019  Substance Use Topics  . Alcohol use: Yes    Alcohol/week: 1.2 oz    Types: 2 Glasses of wine per week    Comment: occasional   Past Medical History:  Diagnosis Date  . Anxiety   . Breast cancer of upper-outer quadrant of right female breast ([place]) 10/2013   Anastrazole since 12/2013--plan is to take this for 5 yrs.  No evidence of dz recurrence as of 11/2015 oncol f/u.  Marland Kitchen Cataract   . COPD (chronic obstructive pulmonary disease) ([place])   . Macular degeneration   . Osteoporosis 10/2014   T-score -2.8  . Pneumonia    hx of   . Retina hole   . Skin cancer   . Stroke ([place])   . Tobacco dependence    Past Surgical History:  Procedure Laterality Date  . ABDOMINAL HYSTERECTOMY  1980   (ovaries intact)  . APPENDECTOMY    . BREAST BIOPSY Right   . CATARACT EXTRACTION Bilateral   . MASTECTOMY Right   . MOHS SURGERY     Dr. Sarajane Jews  . PARS PLANA VITRECTOMY W/ REPAIR OF MACULAR HOLE Right   . RETINAL LASER PROCEDURE    . TOTAL MASTECTOMY Right 12/18/2013   Procedure: RIGHT TOTAL MASTECTOMY;  Surgeon: Excell Seltzer, MD;  Location: [facility];  Service: General;  Laterality: Right;   Family History  Problem Relation Age of Onset  . Heart attack Mother        Deceased  . Cancer Mother        fallopian tube cancer in 69s; deceased 58  . Throat cancer Father        Deceased 56; smoker  .  Diabetes Brother   . Prostate cancer Brother 51       Currently 81  . Other Brother        Deceased, hemorhhage of pancreas  . Leukemia Maternal Aunt   . Breast cancer Paternal Aunt        age at diagnosis unknown  . Throat cancer Paternal Uncle        Deceased 91s; smoker  . Leukemia Paternal Grandmother    Allergies as of 06/10/2017   No Known Allergies     Medication List        Accurate as of 06/10/17 12:31 PM. Always use your most recent med list.          anastrozole 1 MG tablet Commonly known as:  ARIMIDEX Take 1 tablet (1 mg total) by mouth daily.   aspirin 81 MG tablet Take 1 tablet (81 mg total) by mouth daily.   Cholecalciferol 2000 units Caps Take 1 capsule (2,000 Units total) by mouth daily.   cyanocobalamin 1000 MCG tablet Take 1,000 mcg by mouth daily.   donepezil 10 MG tablet Commonly known as:  ARICEPT Take 1 tablets daily.   mirtazapine 7.5 MG tablet Commonly known as:  REMERON Take 7.5 mg by mouth at bedtime.   PRESERVISION AREDS 2 PO Take 1 tablet by mouth 2 (two) times daily.   Tiotropium Bromide Monohydrate 2.5 MCG/ACT Aers Commonly known as:  SPIRIVA RESPIMAT Inhale 2 puffs into the lungs daily.      ROS: Negative, with the  exception of above mentioned in HPI  Objective:  BP 114/62 (BP Location: Left Arm, Patient Position: Sitting, Cuff Size: Small)   Pulse 69   Temp 97.9 F (36.6 C)   Resp 20   Ht _0  (1.626 m)   Wt 103 lb 8 oz (46.9 kg)   SpO2 96%   BMI 17.77 kg/m  Body mass index is 17.77 kg/m.  Gen: Afebrile. No acute distress.  Nontoxic in appearance, thin Caucasian female.  Pleasant. HENT: AT. [place].  MMM.  Eyes:Pupils Equal Round Reactive to light, Extraocular movements intact,  Conjunctiva without redness, discharge or icterus. CV: RRR murmur, no edema, +2/4 P posterior tibialis pulses Chest: CTAB, no wheeze or crackles Abd: Soft. NTND. BS present.  No masses palpated.   Neuro:  Normal gait. PERLA. EOMi. Alert.  Oriented x3 Psych: Normal affect, dress and demeanor. Normal speech. Normal thought content and judgment.   Assessment/Plan: KETRA DUCHESNE is a 84 y.o. female present for  OV for  Cachexia ([place]) - continue Remeron (refills provided today), stop smoking. Eat 3 meals a day.  - Remeron: Remeron at 7.5 mg dose QHS, try backing up the time of the dose,  1-2 hours before bed.  Vascular dementia without behavioral disturbance Cont f/u with neurology. Appears stable.  Vaccine counseling Pt reminded to have Shingrix #2 ASAP. Osteoporosis, unspecified osteoporosis type, unspecified pathological fracture presence - again discussed prolia, risk vs benefits given her level of OP and gum disease. Again stressed this is a personal decision. Would think her fx risk in 10 year sis much higher than her Osteonecrosis risk, but that is speculation and w/out guarantee. They will continue to consider and call if they decide to go ahead with injections.   - f/u 6 months  Electronically Signed by: Howard Pouch, DO [facility]- OR   electronically signed by:  Howard Pouch, DO  [place]

## 2017-06-10 NOTE — Patient Instructions (Addendum)
Please make sure to get your second shingrix shot ASAP.  Think about the prolia. If you decide to get it, make a nurse appt and we will get it started for you.   Try taking Remeron earlier (about 1-2 hours prior to bed)  F/U 6 months.    Please help [place] help you:  We are honored you have chosen [facility] for your Primary Care home. Below you will find basic instructions that you may need to access in the future. Please help [place] help you by reading the instructions, which cover many of the frequent questions we experience.   Prescription refills and request:  -In order to allow more efficient response time, please call your pharmacy for all refills. They will forward the request electronically to [place]. This allows for the quickest possible response. Request left on a nurse line can take longer to refill, since these are checked as time allows between office patients and other phone calls.  - refill request can take up to 3-5 working days to complete.  - If request is sent electronically and request is appropiate, it is usually completed in 1-2 business days.  - all patients will need to be seen routinely for all chronic medical conditions requiring prescription medications (see follow-up below). If you are overdue for follow up on your condition, you will be asked to make an appointment and we will call in enough medication to cover you until your appointment (up to 30 days).  - all controlled substances will require a face to face visit to request/refill.  - if you desire your prescriptions to go through a new pharmacy, and have an active script at original pharmacy, you will need to call your pharmacy and have scripts transferred to new pharmacy. This is completed between the pharmacy locations and not by your provider.    Results: If any images or labs were ordered, it can take up to 1 week to get results depending on the test ordered and the lab/facility running and resulting the test. -  Normal or stable results, which do not need further discussion, may be released to your mychart immediately with attached note to you. A call may not be generated for normal results. Please make certain to sign up for mychart. If you have questions on how to activate your mychart you can call the front office.  - If your results need further discussion, our office will attempt to contact you via phone, and if unable to reach you after 2 attempts, we will release your abnormal result to your mychart with instructions.  - All results will be automatically released in mychart after 1 week.  - Your provider will provide you with explanation and instruction on all relevant material in your results. Please keep in mind, results and labs may appear confusing or abnormal to the untrained eye, but it does not mean they are actually abnormal for you personally. If you have any questions about your results that are not covered, or you desire more detailed explanation than what was provided, you should make an appointment with your provider to do so.   Our office handles many outgoing and incoming calls daily. If we have not contacted you within 1 week about your results, please check your mychart to see if there is a message first and if not, then contact our office.  In helping with this matter, you help decrease call volume, and therefore allow [place] to be able to respond to patients  needs more efficiently.   Acute office visits (sick visit):  An acute visit is intended for a new problem and are scheduled in shorter time slots to allow schedule openings for patients with new problems. This is the appropriate visit to discuss a new problem. In order to provide you with excellent quality medical care with proper time for you to explain your problem, have an exam and receive treatment with instructions, these appointments should be limited to one new problem per visit. If you experience a new problem, in which you desire  to be addressed, please make an acute office visit, we save openings on the schedule to accommodate you. Please do not save your new problem for any other type of visit, let us take care of it properly and quickly for you.   Follow up visits:  Depending on your condition(s) your provider will need to see you routinely in order to provide you with quality care and prescribe medication(s). Most chronic conditions (Example: hypertension, Diabetes, depression/anxiety... etc), require visits a couple times a year. Your provider will instruct you on proper follow up for your personal medical conditions and history. Please make certain to make follow up appointments for your condition as instructed. Failing to do so could result in lapse in your medication treatment/refills. If you request a refill, and are overdue to be seen on a condition, we will always provide you with a 30 day script (once) to allow you time to schedule.    Medicare wellness (well visit): - we have a wonderful Nurse Maudie Mercury), that will meet with you and provide you will yearly medicare wellness visits. These visits should occur yearly (can not be scheduled less than 1 calendar year apart) and cover preventive health, immunizations, advance directives and screenings you are entitled to yearly through your medicare benefits. Do not miss out on your entitled benefits, this is when medicare will pay for these benefits to be ordered for you.  These are strongly encouraged by your provider and is the appropriate type of visit to make certain you are up to date with all preventive health benefits. If you have not had your medicare wellness exam in the last 12 months, please make certain to schedule one by calling the office and schedule your medicare wellness with Maudie Mercury as soon as possible.   Yearly physical (well visit):  - Adults are recommended to be seen yearly for physicals. Check with your insurance and date of your last physical, most insurances  require one calendar year between physicals. Physicals include all preventive health topics, screenings, medical exam and labs that are appropriate for gender/age and history. You may have fasting labs needed at this visit. This is a well visit (not a sick visit), new problems should not be covered during this visit (see acute visit).  - Pediatric patients are seen more frequently when they are younger. Your provider will advise you on well child visit timing that is appropriate for your their age. - This is not a medicare wellness visit. Medicare wellness exams do not have an exam portion to the visit. Some medicare companies allow for a physical, some do not allow a yearly physical. If your medicare allows a yearly physical you can schedule the medicare wellness with our nurse Maudie Mercury and have your physical with your provider after, on the same day. Please check with insurance for your full benefits.   Late Policy/No Shows:  - all new patients should arrive 15-30 minutes earlier than appointment to  allow [place] time  to  obtain all personal demographics,  insurance information and for you to complete office paperwork. - All established patients should arrive 10-15 minutes earlier than appointment time to update all information and be checked in .  - In our best efforts to run on time, if you are late for your appointment you will be asked to either reschedule or if able, we will work you back into the schedule. There will be a wait time to work you back in the schedule,  depending on availability.  - If you are unable to make it to your appointment as scheduled, please call 24 hours ahead of time to allow [place] to fill the time slot with someone else who needs to be seen. If you do not cancel your appointment ahead of time, you may be charged a no show fee.

## 2017-06-14 ENCOUNTER — Other Ambulatory Visit: Payer: Self-pay | Admitting: Pharmacist

## 2017-06-14 NOTE — Patient Outreach (Signed)
[facility]) Care Management  06/14/2017  CAROLINE LONGIE [birth year] [id number]   Called patient's daughter to follow up on pill packs.  HIPAA identifiers were obtained.  Patient's daughter (tracy) confirmed the patient received her pill packs from [facility] yesterday and understands how to use them.  Patient's daughter and her son help manage medications.  Plan: Close pharmacy case as the patient has her pill packs.   Elayne Guerin, PharmD, College Clinical Pharmacist [phone]

## 2017-08-03 DIAGNOSIS — D689 Coagulation defect, unspecified: Secondary | ICD-10-CM | POA: Diagnosis not present

## 2017-08-03 DIAGNOSIS — Z7901 Long term (current) use of anticoagulants: Secondary | ICD-10-CM | POA: Diagnosis not present

## 2017-08-03 DIAGNOSIS — W19XXXA Unspecified fall, initial encounter: Secondary | ICD-10-CM | POA: Diagnosis not present

## 2017-08-03 DIAGNOSIS — Y92009 Unspecified place in unspecified non-institutional (private) residence as the place of occurrence of the external cause: Secondary | ICD-10-CM | POA: Diagnosis not present

## 2017-08-03 DIAGNOSIS — M79621 Pain in right upper arm: Secondary | ICD-10-CM | POA: Diagnosis not present

## 2017-08-03 DIAGNOSIS — Z79899 Other long term (current) drug therapy: Secondary | ICD-10-CM | POA: Diagnosis not present

## 2017-08-06 DIAGNOSIS — S41111A Laceration without foreign body of right upper arm, initial encounter: Secondary | ICD-10-CM | POA: Diagnosis not present

## 2017-08-09 ENCOUNTER — Ambulatory Visit: Payer: Medicare HMO | Admitting: Family Medicine

## 2017-08-09 ENCOUNTER — Encounter: Payer: Self-pay | Admitting: Family Medicine

## 2017-08-09 ENCOUNTER — Other Ambulatory Visit: Payer: Self-pay | Admitting: *Deleted

## 2017-08-09 ENCOUNTER — Other Ambulatory Visit: Payer: Self-pay

## 2017-08-09 ENCOUNTER — Ambulatory Visit (INDEPENDENT_AMBULATORY_CARE_PROVIDER_SITE_OTHER): Payer: Medicare HMO | Admitting: Family Medicine

## 2017-08-09 VITALS — BP 110/58 | HR 76 | Temp 98.0°F | Resp 16 | Wt 105.5 lb

## 2017-08-09 DIAGNOSIS — S40811A Abrasion of right upper arm, initial encounter: Secondary | ICD-10-CM | POA: Diagnosis not present

## 2017-08-09 NOTE — Patient Instructions (Signed)
1. Keep dressing clean and dry.  2. A nurse will contact in the next 1-3 days to schedule routine dressing changes every 2-3 days for 2 weeks.  3. Follow up with me in 2 weeks and hopefully we can take the bandages off for good.

## 2017-08-09 NOTE — Progress Notes (Signed)
Per verbal order from [name], [facility] referral ordered for dressing changes (right upper extremity abrasion) every other day x 2 weeks.   Roderic Ovens, RN, MSN
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
## 2017-08-09 NOTE — Patient Outreach (Signed)
[name]) Care Management  08/09/2017  Gabrielle [name] [birth year] [id number]   Telephone Screen  Referral Date: 08/09/17 Referral Source: MD referral  Referral Reason: wound care/dressing changes - recent abrasion of right upper extremity [place]   Outreach attempt #1 Outreach attempt to patient. No answer and unable to leave voicemail message at [phone] (listed as temporary number for this pt in Epic snap shot) [facility] RN CM left HIPAA compliant message along with contact info at [name]'s home number and at her son's home/mobile number as he is listed as a designated person per epic  Plan: [facility] RN CM sent an unsuccessful outreach letter and scheduled this patient for another call attempt within 1-2 business days  [place] L. Lavina Hamman, RN, BSN, CCM [facility] Telephonic Care Management Care Coordinator Direct number [phone]  Main [facility] number [phone] Fax number [phone]

## 2017-08-11 ENCOUNTER — Other Ambulatory Visit: Payer: Self-pay | Admitting: *Deleted

## 2017-08-11 NOTE — Patient Outreach (Signed)
[facility] [facility]) Care Management  08/11/2017  Gabrielle [name] 02/21/1931 [id number]   Telephone Screen  Referral Date: 08/09/17 Referral Source: MD referral  Referral Reason: wound care/dressing changes - recent abrasion of right upper extremity [place]   Outreach attempt #2 [facility] RN CM left HIPAA compliant message along with contact info at [name]'s home number, mobile number and at her son's home/mobile number as he is listed as a designated person per epic  Plan: [facility] RN CM sent an unsuccessful outreach letter on 08/09/17 and scheduled this patient for another call attempt within 4 business days  [place] L. Lavina Hamman, RN, BSN, CCM [facility] Telephonic Care Management Care Coordinator Direct number [phone]  Main [facility] number [phone] Fax number [phone]

## 2017-08-14 DIAGNOSIS — I69318 Other symptoms and signs involving cognitive functions following cerebral infarction: Secondary | ICD-10-CM | POA: Diagnosis not present

## 2017-08-14 DIAGNOSIS — F17210 Nicotine dependence, cigarettes, uncomplicated: Secondary | ICD-10-CM | POA: Diagnosis not present

## 2017-08-14 DIAGNOSIS — S50311D Abrasion of right elbow, subsequent encounter: Secondary | ICD-10-CM | POA: Diagnosis not present

## 2017-08-14 DIAGNOSIS — Z7982 Long term (current) use of aspirin: Secondary | ICD-10-CM | POA: Diagnosis not present

## 2017-08-14 DIAGNOSIS — C50411 Malignant neoplasm of upper-outer quadrant of right female breast: Secondary | ICD-10-CM | POA: Diagnosis not present

## 2017-08-14 DIAGNOSIS — J449 Chronic obstructive pulmonary disease, unspecified: Secondary | ICD-10-CM | POA: Diagnosis not present

## 2017-08-14 DIAGNOSIS — M810 Age-related osteoporosis without current pathological fracture: Secondary | ICD-10-CM | POA: Diagnosis not present

## 2017-08-14 DIAGNOSIS — F419 Anxiety disorder, unspecified: Secondary | ICD-10-CM | POA: Diagnosis not present

## 2017-08-14 DIAGNOSIS — F0150 Vascular dementia without behavioral disturbance: Secondary | ICD-10-CM | POA: Diagnosis not present

## 2017-08-15 ENCOUNTER — Other Ambulatory Visit: Payer: Self-pay | Admitting: *Deleted
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
## 2017-08-16 ENCOUNTER — Telehealth: Payer: Self-pay | Admitting: Family Medicine

## 2017-08-16 NOTE — Telephone Encounter (Signed)
Copied from [place] [id number]. Topic: Quick Communication - See Telephone Encounter >> Aug 16, 2017  4:12 PM Neva Seat wrote: Pt's daughter - [name] - [phone] Wanting to discuss cut on pt's arm.

## 2017-08-17 NOTE — Telephone Encounter (Signed)
Spoke with daughter she just had questions about patients follow up appt and her wound care. Answered all questions.

## 2017-08-18 DIAGNOSIS — S50311D Abrasion of right elbow, subsequent encounter: Secondary | ICD-10-CM | POA: Diagnosis not present

## 2017-08-18 DIAGNOSIS — C50411 Malignant neoplasm of upper-outer quadrant of right female breast: Secondary | ICD-10-CM | POA: Diagnosis not present

## 2017-08-18 DIAGNOSIS — J449 Chronic obstructive pulmonary disease, unspecified: Secondary | ICD-10-CM | POA: Diagnosis not present

## 2017-08-18 DIAGNOSIS — F17210 Nicotine dependence, cigarettes, uncomplicated: Secondary | ICD-10-CM | POA: Diagnosis not present

## 2017-08-18 DIAGNOSIS — F419 Anxiety disorder, unspecified: Secondary | ICD-10-CM | POA: Diagnosis not present

## 2017-08-18 DIAGNOSIS — I69318 Other symptoms and signs involving cognitive functions following cerebral infarction: Secondary | ICD-10-CM | POA: Diagnosis not present

## 2017-08-18 DIAGNOSIS — M810 Age-related osteoporosis without current pathological fracture: Secondary | ICD-10-CM | POA: Diagnosis not present

## 2017-08-18 DIAGNOSIS — F0150 Vascular dementia without behavioral disturbance: Secondary | ICD-10-CM | POA: Diagnosis not present

## 2017-08-18 DIAGNOSIS — Z7982 Long term (current) use of aspirin: Secondary | ICD-10-CM | POA: Diagnosis not present

## 2017-08-20 DIAGNOSIS — J449 Chronic obstructive pulmonary disease, unspecified: Secondary | ICD-10-CM | POA: Diagnosis not present

## 2017-08-20 DIAGNOSIS — F419 Anxiety disorder, unspecified: Secondary | ICD-10-CM | POA: Diagnosis not present

## 2017-08-20 DIAGNOSIS — M810 Age-related osteoporosis without current pathological fracture: Secondary | ICD-10-CM | POA: Diagnosis not present

## 2017-08-20 DIAGNOSIS — S50311D Abrasion of right elbow, subsequent encounter: Secondary | ICD-10-CM | POA: Diagnosis not present

## 2017-08-20 DIAGNOSIS — F0150 Vascular dementia without behavioral disturbance: Secondary | ICD-10-CM | POA: Diagnosis not present

## 2017-08-20 DIAGNOSIS — Z7982 Long term (current) use of aspirin: Secondary | ICD-10-CM | POA: Diagnosis not present

## 2017-08-20 DIAGNOSIS — F17210 Nicotine dependence, cigarettes, uncomplicated: Secondary | ICD-10-CM | POA: Diagnosis not present

## 2017-08-20 DIAGNOSIS — I69318 Other symptoms and signs involving cognitive functions following cerebral infarction: Secondary | ICD-10-CM | POA: Diagnosis not present

## 2017-08-20 DIAGNOSIS — C50411 Malignant neoplasm of upper-outer quadrant of right female breast: Secondary | ICD-10-CM | POA: Diagnosis not present

## 2017-08-22 DIAGNOSIS — Z7982 Long term (current) use of aspirin: Secondary | ICD-10-CM | POA: Diagnosis not present

## 2017-08-22 DIAGNOSIS — C50411 Malignant neoplasm of upper-outer quadrant of right female breast: Secondary | ICD-10-CM | POA: Diagnosis not present

## 2017-08-22 DIAGNOSIS — F419 Anxiety disorder, unspecified: Secondary | ICD-10-CM | POA: Diagnosis not present

## 2017-08-22 DIAGNOSIS — I69318 Other symptoms and signs involving cognitive functions following cerebral infarction: Secondary | ICD-10-CM | POA: Diagnosis not present

## 2017-08-22 DIAGNOSIS — J449 Chronic obstructive pulmonary disease, unspecified: Secondary | ICD-10-CM | POA: Diagnosis not present

## 2017-08-22 DIAGNOSIS — M810 Age-related osteoporosis without current pathological fracture: Secondary | ICD-10-CM | POA: Diagnosis not present

## 2017-08-22 DIAGNOSIS — F0150 Vascular dementia without behavioral disturbance: Secondary | ICD-10-CM | POA: Diagnosis not present

## 2017-08-22 DIAGNOSIS — F17210 Nicotine dependence, cigarettes, uncomplicated: Secondary | ICD-10-CM | POA: Diagnosis not present

## 2017-08-22 DIAGNOSIS — S50311D Abrasion of right elbow, subsequent encounter: Secondary | ICD-10-CM | POA: Diagnosis not present

## 2017-08-23 ENCOUNTER — Other Ambulatory Visit: Payer: Self-pay | Admitting: Oncology

## 2017-08-23 DIAGNOSIS — Z1231 Encounter for screening mammogram for malignant neoplasm of breast: Secondary | ICD-10-CM

## 2017-08-24 ENCOUNTER — Telehealth: Payer: Self-pay | Admitting: *Deleted

## 2017-08-24 DIAGNOSIS — S50311D Abrasion of right elbow, subsequent encounter: Secondary | ICD-10-CM | POA: Diagnosis not present

## 2017-08-24 DIAGNOSIS — J449 Chronic obstructive pulmonary disease, unspecified: Secondary | ICD-10-CM | POA: Diagnosis not present

## 2017-08-24 DIAGNOSIS — Z7982 Long term (current) use of aspirin: Secondary | ICD-10-CM | POA: Diagnosis not present

## 2017-08-24 DIAGNOSIS — F0150 Vascular dementia without behavioral disturbance: Secondary | ICD-10-CM | POA: Diagnosis not present

## 2017-08-24 DIAGNOSIS — C50411 Malignant neoplasm of upper-outer quadrant of right female breast: Secondary | ICD-10-CM | POA: Diagnosis not present

## 2017-08-24 DIAGNOSIS — F419 Anxiety disorder, unspecified: Secondary | ICD-10-CM | POA: Diagnosis not present

## 2017-08-24 DIAGNOSIS — F17210 Nicotine dependence, cigarettes, uncomplicated: Secondary | ICD-10-CM | POA: Diagnosis not present

## 2017-08-24 DIAGNOSIS — I69318 Other symptoms and signs involving cognitive functions following cerebral infarction: Secondary | ICD-10-CM | POA: Diagnosis not present

## 2017-08-24 DIAGNOSIS — M810 Age-related osteoporosis without current pathological fracture: Secondary | ICD-10-CM | POA: Diagnosis not present

## 2017-08-24 NOTE — Telephone Encounter (Signed)
No 30 minute slots available. Please advise

## 2017-08-24 NOTE — Telephone Encounter (Signed)
Left message on Tracy's cell number we can see her mother at 1:30 Friday 08/26/17

## 2017-08-24 NOTE — Telephone Encounter (Signed)
Copied from [place] [id number]. Topic: General - Other >> Aug 24, 2017  3:44 PM Valla Leaver wrote: Reason for CRM: Patient's daughter Gabrielle [name] calling to get mom worked in on Friday 06/28 at 1:45 or 2:45 for wound care on right arm. The dressing have been managed at home by aid and she was supposed to f/u with [facility]. Her appts are set at 63mins because of age and they are 31min slots. Please call back.

## 2017-08-24 NOTE — Telephone Encounter (Signed)
She can be placed in the same day appointment slot-30 minutes if able, otherwise is from the 145 appointment slot and block the 245 appointment slot to allow time to catch up.

## 2017-08-25 ENCOUNTER — Telehealth: Payer: Self-pay | Admitting: *Deleted

## 2017-08-25 NOTE — Telephone Encounter (Signed)
Received Home Health certification orders placed on Dr Lucita Lora desk for review.

## 2017-08-25 NOTE — Telephone Encounter (Signed)
Faxed back to [place]. Copy sent to scan.

## 2017-08-25 NOTE — Telephone Encounter (Signed)
Signed. Returned to staff.

## 2017-08-26 ENCOUNTER — Encounter: Payer: Self-pay | Admitting: Family Medicine

## 2017-08-26 ENCOUNTER — Ambulatory Visit (INDEPENDENT_AMBULATORY_CARE_PROVIDER_SITE_OTHER): Payer: Medicare HMO | Admitting: Family Medicine

## 2017-08-26 VITALS — BP 109/65 | HR 81 | Temp 98.0°F | Resp 16 | Wt 109.0 lb

## 2017-08-26 DIAGNOSIS — S40811D Abrasion of right upper arm, subsequent encounter: Secondary | ICD-10-CM | POA: Diagnosis not present

## 2017-08-26 DIAGNOSIS — Z5189 Encounter for other specified aftercare: Secondary | ICD-10-CM

## 2017-08-26 NOTE — Patient Instructions (Signed)
Bag balm application daily to area.  You should be ok to continue cleansing and dressing wound on your own, it is healing.   Monitor for redness or drainage and follow up immediatly if it looks like it is becoming infected
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
## 2017-08-30 ENCOUNTER — Telehealth: Payer: Self-pay | Admitting: *Deleted

## 2017-08-30 DIAGNOSIS — Z7982 Long term (current) use of aspirin: Secondary | ICD-10-CM | POA: Diagnosis not present

## 2017-08-30 DIAGNOSIS — I69318 Other symptoms and signs involving cognitive functions following cerebral infarction: Secondary | ICD-10-CM | POA: Diagnosis not present

## 2017-08-30 DIAGNOSIS — F0150 Vascular dementia without behavioral disturbance: Secondary | ICD-10-CM | POA: Diagnosis not present

## 2017-08-30 DIAGNOSIS — C50411 Malignant neoplasm of upper-outer quadrant of right female breast: Secondary | ICD-10-CM | POA: Diagnosis not present

## 2017-08-30 DIAGNOSIS — M810 Age-related osteoporosis without current pathological fracture: Secondary | ICD-10-CM | POA: Diagnosis not present

## 2017-08-30 DIAGNOSIS — S50311D Abrasion of right elbow, subsequent encounter: Secondary | ICD-10-CM | POA: Diagnosis not present

## 2017-08-30 DIAGNOSIS — F17210 Nicotine dependence, cigarettes, uncomplicated: Secondary | ICD-10-CM | POA: Diagnosis not present

## 2017-08-30 DIAGNOSIS — F419 Anxiety disorder, unspecified: Secondary | ICD-10-CM | POA: Diagnosis not present

## 2017-08-30 DIAGNOSIS — J449 Chronic obstructive pulmonary disease, unspecified: Secondary | ICD-10-CM | POA: Diagnosis not present

## 2017-08-30 NOTE — Telephone Encounter (Signed)
Copied from [place] [id number]. Topic: Quick Communication - See Telephone Encounter >> Aug 30, 2017  1:37 PM Hewitt Shorts wrote: Milbert Coulter from [facility] is needing verbal orders to have a discharge visit to make sure arm is ok before she goes out of town   [employer] number [phone]

## 2017-08-30 NOTE — Telephone Encounter (Signed)
Ok to see patient for arm wound verbal orders given.

## 2017-09-06 ENCOUNTER — Telehealth: Payer: Self-pay | Admitting: *Deleted

## 2017-09-06 NOTE — Telephone Encounter (Signed)
Faxed signed orders to Home health agency

## 2017-09-06 NOTE — Telephone Encounter (Signed)
Home health orders received 09/06/17 for Gabrielle [name] health initiation orders: no.  Home health re-certification orders: yes. Patient last seen by ordering physician for this condition: 08/26/17. Must be less than 90 days for re-certification and less than 30 days prior for initiation. Visit must have been for the condition the orders are being placed.  Patient meets criteria for Physician to sign orders: yes        Current med list has been attached: yes        Orders placed on physicians desk for signature: 09/06/17 (date) If patient does not meet criteria for orders to be signed: pt was called to schedule appt. Appt is scheduled for.   Leota Jacobsen

## 2017-09-06 NOTE — Telephone Encounter (Signed)
Received, signed returned. DC services - no further wound care needed moving forward.

## 2017-09-14 ENCOUNTER — Ambulatory Visit
Admission: RE | Admit: 2017-09-14 | Discharge: 2017-09-14 | Disposition: A | Discharge status: Home / Self Care | Payer: Medicare HMO | Source: Ambulatory Visit | Attending: Oncology | Admitting: Oncology

## 2017-09-14 DIAGNOSIS — Z1231 Encounter for screening mammogram for malignant neoplasm of breast: Secondary | ICD-10-CM

## 2017-11-04 ENCOUNTER — Encounter: Payer: Self-pay | Admitting: Neurology

## 2017-11-21 ENCOUNTER — Ambulatory Visit (INDEPENDENT_AMBULATORY_CARE_PROVIDER_SITE_OTHER): Payer: Medicare HMO | Admitting: Family Medicine

## 2017-11-21 ENCOUNTER — Encounter: Payer: Self-pay | Admitting: Family Medicine

## 2017-11-21 VITALS — BP 137/82 | HR 100 | Temp 98.5°F | Resp 16 | Ht 64.0 in | Wt 113.2 lb

## 2017-11-21 DIAGNOSIS — J441 Chronic obstructive pulmonary disease with (acute) exacerbation: Secondary | ICD-10-CM | POA: Diagnosis not present

## 2017-11-21 MED ORDER — ALBUTEROL SULFATE HFA 108 (90 BASE) MCG/ACT IN AERS: 2.0000 | INHALATION_SPRAY | Freq: Four times a day (QID) | RESPIRATORY_TRACT | 0 refills | Status: DC | PRN

## 2017-11-21 MED ORDER — PREDNISONE 20 MG PO TABS: ORAL_TABLET | ORAL | 0 refills | Status: DC

## 2017-11-21 MED ORDER — AZITHROMYCIN 250 MG PO TABS: ORAL_TABLET | ORAL | 0 refills | Status: DC

## 2017-11-21 NOTE — Progress Notes (Signed)
OFFICE VISIT  11/21/2017   CC:  Chief Complaint  Patient presents with  . Cough   HPI:    Patient is a 84 y.o.  female patient of [name] who has dx of vascular dementia and has a hx of tobacco dependence who presents by herself for cough.  Kids concerned b/c she has had pneumonia. Two month hx of runny nose in mornings and productive cough, pale yellow color--sticky/slimy consistency.  No hemoptysis.  Feels DOE but this sounds chronic and not changed since onset of cough.  Rare wheeze at baseline, no change during this illness.  No chest pain.  No fever.  No nausea.  Occ orthostatic dizziness.   Still with normal/good intake of food and drink during this time. No swelling in LL's.  No ST.  No HA.  Takes spiriva daily but has no rescue albut inhaler.   Pertinent imaging: 06/01/17 CT chest w/out contrast: EXAM: CT CHEST WITHOUT CONTRAST  TECHNIQUE: Multidetector CT imaging of the chest was performed following the standard protocol without IV contrast.  COMPARISON:  11/24/2016.  FINDINGS: Cardiovascular: The heart size is normal. No pericardial effusion. Coronary artery calcification is evident. Atherosclerotic calcification is noted in the wall of the thoracic aorta.  Mediastinum/Nodes: Stable 10 mm short axis precarinal lymph node. Calcified lymph nodes seen in the left hilum. The esophagus has normal imaging features. There is no axillary lymphadenopathy.  Lungs/Pleura: Centrilobular and paraseptal emphysema again noted. Patchy airspace disease seen previously posterior right upper lobe has clearly improved in the interval with some residual tree-in-bud nodularity, bronchiectasis, and small airway impaction identified posteriorly along the major fissure. Areas of mild bronchiectasis, airway impaction, and scarring in the right middle lobe are stable. There is bronchial wall thickening with mild bronchiectasis in the lower lungs bilaterally. 4 mm right lower lobe  nodule (image 104/series 3) is stable since prior study and also comparing to 06/27/2014 consistent with benign etiology. Stable calcified granuloma in the lingula.  Upper Abdomen: Stable adrenal thickening bilaterally, compatible with hyperplasia. Granulomas disease again noted in the spleen.  Musculoskeletal: Bone windows reveal no worrisome lytic or sclerotic osseous lesions.  IMPRESSION: 1. Continued interval improvement in multifocal pulmonary opacities. Residua identified in the posterior right upper lobe, right middle lobe, and to a modest degree in each lower lobe may reflect incomplete resolution, evolving scar, or recurrent disease. Features may be related to atypical infection although aspiration can have this appearance. 2.  Emphysema. (ICD10-J43.9) 3.  Aortic Atherosclerois (ICD10-170.0)  Past Medical History:  Diagnosis Date  . Anxiety   . Breast cancer of upper-outer quadrant of right female breast ([place]) 10/2013   Anastrazole since 12/2013--plan is to take this for 5 yrs.  No evidence of dz recurrence as of 11/2015 oncol f/u.  Marland Kitchen Cataract   . COPD (chronic obstructive pulmonary disease) ([place])   . Macular degeneration   . Osteoporosis 10/2014   T-score -2.8  . Pneumonia    hx of   . Retina hole   . Skin cancer   . Stroke ([place])   . Tobacco dependence     Past Surgical History:  Procedure Laterality Date  . ABDOMINAL HYSTERECTOMY  1980   (ovaries intact)  . APPENDECTOMY    . BREAST BIOPSY Right   . CATARACT EXTRACTION Bilateral   . MASTECTOMY Right   . MOHS SURGERY     Dr. Sarajane Jews  . PARS PLANA VITRECTOMY W/ REPAIR OF MACULAR HOLE Right   . RETINAL LASER PROCEDURE    .  TOTAL MASTECTOMY Right 12/18/2013   Procedure: RIGHT TOTAL MASTECTOMY;  Surgeon: Excell Seltzer, MD;  Location: [facility];  Service: General;  Laterality: Right;    Outpatient Medications Prior to Visit  Medication Sig Dispense Refill  . aspirin 81 MG tablet Take 1 tablet (81 mg  total) by mouth daily. 90 tablet 3  . Cholecalciferol 2000 units CAPS Take 1 capsule (2,000 Units total) by mouth daily. (Patient taking differently: Take 5,000 Units by mouth daily. ) 90 each 3  . cyanocobalamin 1000 MCG tablet Take 1,000 mcg by mouth daily.    Marland Kitchen donepezil (ARICEPT) 10 MG tablet Take 1 tablets daily. 90 tablet 3  . Multiple Vitamins-Minerals (PRESERVISION AREDS 2 PO) Take 1 tablet by mouth 2 (two) times daily.    . Tiotropium Bromide Monohydrate (SPIRIVA RESPIMAT) 2.5 MCG/ACT AERS Inhale 2 puffs into the lungs daily. 1 Inhaler 5  . anastrozole (ARIMIDEX) 1 MG tablet Take 1 tablet (1 mg total) by mouth daily. (Patient not taking: Reported on 08/09/2017) 90 tablet 3  . cephALEXin (KEFLEX) 500 MG capsule     . mirtazapine (REMERON) 7.5 MG tablet Take 1 tablet (7.5 mg total) by mouth at bedtime. (Patient not taking: Reported on 08/09/2017) 90 tablet 1   No facility-administered medications prior to visit.     No Known Allergies  ROS As per HPI  PE: Blood pressure 137/82, pulse 100, temperature 98.5 F (36.9 C), temperature source Oral, resp. rate 16, height 5\' 4"  (1.626 m), weight 113 lb 4 oz (51.4 kg), SpO2 94 %. VS: noted--normal. Gen: alert, NAD, NONTOXIC APPEARING. HEENT: eyes without injection, drainage, or swelling.  Ears: EACs clear, TMs with normal light reflex and landmarks.  Nose: No active rhinorrhea.  She has some dried, crusty exudate adherent to mildly injected mucosa.  No purulent d/c.  No paranasal sinus TTP.  No facial swelling.  Throat and mouth without focal lesion.  No pharyngial swelling, erythema, or exudate.   Neck: supple, no LAD.   LUNGS: CTA bilat except some insp/exp rhonchi in bases, nonlabored resps.  These rhonchi disappear after forceful coughing.  She has no signif prolonged exp phase.   CV: RRR, no m/r/g. EXT: no c/c/e SKIN: no rash    LABS:    Chemistry      Component Value Date/Time   NA 143 12/13/2016 1259   K 4.1 12/13/2016 1259    CL 101 09/07/2016 1704   CO2 30 (H) 12/13/2016 1259   BUN 19.2 12/13/2016 1259   CREATININE 0.8 12/13/2016 1259      Component Value Date/Time   CALCIUM 9.4 12/13/2016 1259   ALKPHOS 89 12/13/2016 1259   AST 25 12/13/2016 1259   ALT 21 12/13/2016 1259   BILITOT 0.32 12/13/2016 1259      IMPRESSION AND PLAN:  COPD with acute exacerbation. Prednisone 40mg  qd x 5d, then 20mg  qd x 5d. Z pack. Albuterol inhaler 2 p q4h prn.  Continue spiriva qd. Get otc generic robitussin DM OR Mucinex DM and use as directed on the packaging for cough and congestion. Use otc generic saline nasal spray 2-3 times per day to irrigate/moisturize your nasal passages. Nurse did inhaler education for pt today.  An After Visit Summary was printed and given to the patient.  FOLLOW UP: Return in about 1 week (around 11/28/2017) for f/u COPD exacerbation.  Signed:  Crissie Sickles, MD           11/21/2017

## 2017-11-21 NOTE — Patient Instructions (Signed)
Get otc generic robitussin DM OR Mucinex DM and use as directed on the packaging for cough and congestion. Use otc generic saline nasal spray 2-3 times per day to irrigate/moisturize your nasal passages.   

## 2017-11-28 ENCOUNTER — Other Ambulatory Visit: Payer: Self-pay | Admitting: Neurology

## 2017-11-28 ENCOUNTER — Other Ambulatory Visit: Payer: Self-pay | Admitting: Oncology

## 2017-12-02 ENCOUNTER — Other Ambulatory Visit: Payer: Self-pay | Admitting: *Deleted

## 2017-12-02 ENCOUNTER — Telehealth: Payer: Self-pay | Admitting: Family Medicine

## 2017-12-02 MED ORDER — CHOLECALCIFEROL 50 MCG (2000 UT) PO CAPS: 2000.0000 [IU] | ORAL_CAPSULE | Freq: Every day | ORAL | 1 refills | Status: DC

## 2017-12-02 NOTE — Telephone Encounter (Signed)
Patient daughter notified

## 2017-12-02 NOTE — Telephone Encounter (Signed)
Patients Rx dosage was changed pharmacy sent wrong Rx for refill. Sent correct Rx for refill

## 2017-12-02 NOTE — Telephone Encounter (Signed)
Copied from [place] [id number]. Topic: Inquiry >> Dec 02, 2017 12:44 PM Oliver Pila B wrote: Reason for CRM: pt's daughter called (on [place]) and asked why the pt's vitamin D was denied; contact to advise

## 2017-12-14 ENCOUNTER — Encounter: Payer: Self-pay | Admitting: Family Medicine

## 2017-12-14 ENCOUNTER — Ambulatory Visit (INDEPENDENT_AMBULATORY_CARE_PROVIDER_SITE_OTHER): Payer: Medicare HMO | Admitting: Family Medicine

## 2017-12-14 VITALS — BP 118/80 | HR 88 | Temp 98.0°F | Resp 20 | Ht 64.0 in | Wt 113.0 lb

## 2017-12-14 DIAGNOSIS — J441 Chronic obstructive pulmonary disease with (acute) exacerbation: Secondary | ICD-10-CM | POA: Diagnosis not present

## 2017-12-14 NOTE — Progress Notes (Signed)
Gabrielle [name] , 1930/07/14, 84 y.o., female MRN: [id number] Patient Care Team    Relationship Specialty Notifications Start End  Ma Hillock, DO PCP - General Family Medicine  08/01/15   Excell Seltzer, MD Consulting Physician General Surgery  11/29/13   Magrinat, Virgie Dad, MD Consulting Physician Oncology  11/29/13   Arloa Koh, MD Consulting Physician Radiation Oncology  11/29/13   Jarome Matin, MD Consulting Physician Dermatology  06/11/15   Brand Males, MD Consulting Physician Pulmonary Disease  06/11/15   Alda Berthold, DO Consulting Physician Neurology  08/01/15   Armbruster, Carlota Raspberry, MD Consulting Physician Gastroenterology  12/06/16     Chief Complaint  Patient presents with  . Follow-up    productive cough     Subjective: Pt presents for an OV follow up on COPD exacerbation. She was seen last week by my partner and provided with steroid taper and z-pack. She could not get one of the bottles open, so she only took one of the meds. She is uncertain which. She does report she feels better. She still has some cough. She denies fever, chills, fatigue.  Depression screen [facility] 2/9 08/15/2017 06/10/2017 12/06/2016 10/29/2016 08/19/2016  Decreased Interest 0 0 0 0 0  Down, Depressed, Hopeless 0 0 1 3 0  PHQ - 2 Score 0 0 1 3 0  Altered sleeping - - - 0 -  Change in appetite - - - 3 -  Feeling bad or failure about yourself  - - - 3 -  Trouble concentrating - - - 0 -  Moving slowly or fidgety/restless - - - 0 -  Suicidal thoughts - - - 0 -  PHQ-9 Score - - - 3 -  Difficult doing work/chores - - - Not difficult at all -    No Known Allergies Social History   Tobacco Use  . Smoking status: Current Every Day Smoker    Packs/day: 0.50    Years: 60.00    Pack years: 30.00    Types: Cigarettes  . Smokeless tobacco: Never Used  . Tobacco comment: 1/2 ppd 4.4.2019  Substance Use Topics  . Alcohol use: Yes    Alcohol/week: 2.0 standard drinks    Types: 2 Glasses of wine  per week    Comment: occasional   Past Medical History:  Diagnosis Date  . Anxiety   . Breast cancer of upper-outer quadrant of right female breast ([place]) 10/2013   Anastrazole since 12/2013--plan is to take this for 5 yrs.  No evidence of dz recurrence as of 11/2015 oncol f/u.  Marland Kitchen Cataract   . COPD (chronic obstructive pulmonary disease) ([place])   . Macular degeneration   . Osteoporosis 10/2014   T-score -2.8  . Pneumonia    hx of   . Retina hole   . Skin cancer   . Stroke ([place])   . Tobacco dependence    Past Surgical History:  Procedure Laterality Date  . ABDOMINAL HYSTERECTOMY  1980   (ovaries intact)  . APPENDECTOMY    . BREAST BIOPSY Right   . CATARACT EXTRACTION Bilateral   . MASTECTOMY Right   . MOHS SURGERY     Dr. Sarajane Jews  . PARS PLANA VITRECTOMY W/ REPAIR OF MACULAR HOLE Right   . RETINAL LASER PROCEDURE    . TOTAL MASTECTOMY Right 12/18/2013   Procedure: RIGHT TOTAL MASTECTOMY;  Surgeon: Excell Seltzer, MD;  Location: [facility];  Service: General;  Laterality: Right;   Family History  Problem Relation Age of Onset  . Heart attack Mother        Deceased  . Cancer Mother        fallopian tube cancer in 78s; deceased 31  . Throat cancer Father        Deceased 63; smoker  . Diabetes Brother   . Prostate cancer Brother 23       Currently 71  . Other Brother        Deceased, hemorhhage of pancreas  . Leukemia Maternal Aunt   . Breast cancer Paternal Aunt        age at diagnosis unknown  . Throat cancer Paternal Uncle        Deceased 85s; smoker  . Leukemia Paternal Grandmother    Allergies as of 12/14/2017   No Known Allergies     Medication List        Accurate as of 12/14/17 10:13 AM. Always use your most recent med list.          albuterol 108 (90 Base) MCG/ACT inhaler Commonly known as:  PROVENTIL HFA;VENTOLIN HFA Inhale 2 puffs into the lungs every 6 (six) hours as needed for wheezing or shortness of breath.   anastrozole 1 MG  tablet Commonly known as:  ARIMIDEX TAKE 1 TABLET IN THE MORNING.   azithromycin 250 MG tablet Commonly known as:  ZITHROMAX 2 tabs po qd x 1d, then 1 tab po qd x 4d   Cholecalciferol 2000 units Caps Take 1 capsule (2,000 Units total) by mouth daily.   cyanocobalamin 1000 MCG tablet Take 1,000 mcg by mouth daily.   donepezil 10 MG tablet Commonly known as:  ARICEPT Take 1 tablets daily.   mirtazapine 7.5 MG tablet Commonly known as:  REMERON Take 1 tablet (7.5 mg total) by mouth at bedtime.   predniSONE 20 MG tablet Commonly known as:  DELTASONE 2 tabs po qd x 5d, then 1 tab po qd x 5d   PRESERVISION AREDS 2 PO Take 1 tablet by mouth 2 (two) times daily.   QC LO-DOSE ASPIRIN 81 MG EC tablet Generic drug:  aspirin TAKE 1 TABLET IN THE MORNING.   Tiotropium Bromide Monohydrate 2.5 MCG/ACT Aers Inhale 2 puffs into the lungs daily.       All past medical history, surgical history, allergies, family history, immunizations andmedications were updated in the EMR today and reviewed under the history and medication portions of their EMR.     ROS: Negative, with the exception of above mentioned in HPI   Objective:  BP 118/80 (BP Location: Right Arm, Patient Position: Sitting, Cuff Size: Normal)   Pulse 88   Temp 98 F (36.7 C)   Resp 20   Ht 5\' 4"  (1.626 m)   Wt 113 lb (51.3 kg)   SpO2 96%   BMI 19.40 kg/m  Body mass index is 19.4 kg/m. Gen: Afebrile. No acute distress. Nontoxic in appearance, well developed, well nourished.  HENT: AT. [place].  MMM.  Bilateral nares w/out erythema, swelling or drainage.  Throat without erythema or exudates. Cough present.  Eyes:Pupils Equal Round Reactive to light, Extraocular movements intact,  Conjunctiva without redness, discharge or icterus. Neck/lymp/endocrine: Supple,no lymphadenopathy CV: RRR, no edema Chest: CTAB, no wheeze or crackles. Rhonchi present, clears with cough.Good air movement, normal resp effort.  Neuro:  Normal  gait. PERLA. EOMi. Alert. Oriented x3   No exam data present No results found. No results found for this or any previous visit (from the past  24 hour(s)).  Assessment/Plan: Gabrielle [name] is a 84 y.o. female present for OV for  COPD exacerbation ([place]) Appears to have improved. Lungs clear, she feels better. Oxygen sat is up to 96%. Sounds like she was able to take the abx by description of pill count of the ones she did take. Advised her she would still get some benefit from the steroid taper prescribed. Encouraged her to take it to a neighbor, pharmacist or here to help open for her.  - in the future may need to communicate with pharmacy she can not open the child proof caps.  - f/u PRN   Reviewed expectations re: course of current medical issues.  Discussed self-management of symptoms.  Outlined signs and symptoms indicating need for more acute intervention.  Patient verbalized understanding and all questions were answered.  Patient received an After-Visit Summary.    No orders of the defined types were placed in this encounter.    Note is dictated utilizing voice recognition software. Although note has been proof read prior to signing, occasional typographical errors still can be missed. If any questions arise, please do not hesitate to call for verification.   electronically signed by:  Howard Pouch, DO  [place]

## 2017-12-14 NOTE — Patient Instructions (Signed)
Make sure to take the medicine that you could not open either to a neighbor to help open or the pharmacist.   You sound better and your oxygen is better.    Make sure to use inhalers as directed.

## 2017-12-16 ENCOUNTER — Other Ambulatory Visit: Payer: Self-pay

## 2017-12-16 DIAGNOSIS — C50411 Malignant neoplasm of upper-outer quadrant of right female breast: Secondary | ICD-10-CM

## 2017-12-16 DIAGNOSIS — Z170 Estrogen receptor positive status [ER+]: Principal | ICD-10-CM

## 2017-12-19 ENCOUNTER — Inpatient Hospital Stay: Payer: Medicare HMO

## 2017-12-19 ENCOUNTER — Encounter: Payer: Self-pay | Admitting: Oncology

## 2017-12-19 ENCOUNTER — Inpatient Hospital Stay: Payer: Medicare HMO | Attending: Oncology | Admitting: Oncology

## 2017-12-19 NOTE — Progress Notes (Signed)
Gabrielle [name]  [phone] [phone]     ID: Gabrielle [name] DOB: May 15, 1930  MR#: [id number]  NOB#:[id number]  Patient Care Team: Gabrielle Hillock, DO as PCP - General (Family Medicine) Gabrielle Seltzer, MD as Consulting Physician (General Surgery) Gabrielle [name], Gabrielle Dad, MD as Consulting Physician (Oncology) Gabrielle Koh, MD as Consulting Physician (Radiation Oncology) Gabrielle Matin, MD as Consulting Physician (Dermatology) Gabrielle Males, MD as Consulting Physician (Pulmonary Disease) Gabrielle Berthold, DO as Consulting Physician (Neurology) Armbruster, Gabrielle Raspberry, MD as Consulting Physician (Gastroenterology) Gabrielle [name], DDS  CHIEF COMPLAINT: Triple positive breast cancer  CURRENT TREATMENT:  Anastrozole  BREAST CANCER HISTORY: From the original intake note:  Gabrielle [name] had not been to a doctor for "more than 20 years. Since Christmas 2014 and the family has become increasingly concerned that she appeared to be losing weight and was a bit more confused. They asked the patient how much weight she had lost and she said she had lost "about 50 pounds.". However, the only weight we have available before August of this year is from August of 2014 and it was 115 pounds at that time.  Nevertheless with this concern the patient agreed to be seen by a physician and on 10/06/2013 she was seen by Dr. Kathlen Mody who obtained a full battery of tests. He found the patient to have a urinary tract infection with Escherichia coli, which may explain some of her confusion. Vitamin B 12 level was 215, which is in the low normal range. Hemoglobin was 14.7 with an MCV of 99.0. The patient was started on B12 supplementation parenterally and a chest x-ray was obtained 10/05/2013 which showed evidence of emphysema and prior granulomatous disease. To make sure an occult cancer was not being missed a CT scan of the chest abdomen and pelvis was obtained 11/19/2013. This showed a  calcified granuloma in the inferior lingula. There was also a noncalcified 6 mm nodule in the lateral portion of the right lower lobe. There was no mediastinal mass or adenopathy. There were calcified granulomata in the spleen as well. There was significant atherosclerotic calcifications. There was sigmoid diverticulosis noted incidentally. In addition, a 1.8 cm right breast mass was noted.  The breast mass was evaluated further with bilateral diagnostic mammography and right breast ultrasonography of the breast Center 11/26/2013. The breast density was category C. There was indeed an irregular mass in the right breast which was on palpation firm nontender and noted at the 10:30 position. Ultrasound confirmed a hypoechoic irregular mass measuring up to 3 cm. There was no right axillary adenopathy noted.  Biopsy of the right breast mass in question 11/26/2013 showed ([id number]) and invasive ductal carcinoma, grade 2, estrogen receptor 100% positive, progesterone receptor 59% positive, both with strong staining intensity, with an MIB-1 of 31%, and with HER-2 amplification, the signals ratio being 2.79 and the copy number per cell 5.45.  On 11-2013 the patient underwent bilateral breast MRI. This showed the enhancing mass in the upper-outer quadrant of the right breast to measure 2.1 cm. There was no Gabrielle findings in the right breast, left breast, or either axilla.  The patient's subsequent history is as detailed below  INTERVAL HISTORY: Gabrielle [name] did not show for her 12/19/2017 visit at the cancer center  Since her last visit, she underwent screening unilateral left breast mammography with CAD and tomography on 09/14/2017 at [facility] showing: breast density category C. There was no evidence of malignancy.  She also completed  a Chest CT on 06/01/2017 showing: Continued interval improvement in multifocal pulmonary opacities. Residua identified in the posterior right upper lobe, right middle  lobe, and to a modest degree in each lower lobe may reflect incomplete resolution, evolving scar, or recurrent disease. Features may be related to atypical infection although aspiration can have this appearance.  Emphysema. (TMB31-P21.9)  Aortic Atherosclerois (ICD10-170.0   REVIEW OF SYSTEMS: Gabrielle [name]   PAST MEDICAL HISTORY: Past Medical History:  Diagnosis Date  . Anxiety   . Breast cancer of upper-outer quadrant of right female breast ([place]) 10/2013   Anastrazole since 12/2013--plan is to take this for 5 yrs.  No evidence of dz recurrence as of 11/2015 oncol f/u.  Gabrielle [name] Kitchen Cataract   . COPD (chronic obstructive pulmonary disease) ([place])   . Macular degeneration   . Osteoporosis 10/2014   T-score -2.8  . Pneumonia    hx of   . Retina hole   . Skin cancer   . Stroke ([place])   . Tobacco dependence     PAST SURGICAL HISTORY: Past Surgical History:  Procedure Laterality Date  . ABDOMINAL HYSTERECTOMY  1980   (ovaries intact)  . APPENDECTOMY    . BREAST BIOPSY Right   . CATARACT EXTRACTION Bilateral   . MASTECTOMY Right   . MOHS SURGERY     Dr. Sarajane [name]  . PARS PLANA VITRECTOMY W/ REPAIR OF MACULAR HOLE Right   . RETINAL LASER PROCEDURE    . TOTAL MASTECTOMY Right 12/18/2013   Procedure: RIGHT TOTAL MASTECTOMY;  Surgeon: Gabrielle Seltzer, MD;  Location: [facility];  Service: General;  Laterality: Right;    FAMILY HISTORY Family History  Problem Relation Age of Onset  . Heart attack Mother        Deceased  . Cancer Mother        fallopian tube cancer in 95s; deceased 35  . Throat cancer Father        Deceased 36; smoker  . Diabetes Brother   . Prostate cancer Brother 98       Currently 1  . Gabrielle Brother        Deceased, hemorhhage of pancreas  . Leukemia Maternal Aunt   . Breast cancer Paternal Aunt        age at diagnosis unknown  . Throat cancer Paternal Uncle        Deceased 79s; smoker  . Leukemia Paternal Grandmother    the patient's father died at the age of 74 from  throat cancer which has been diagnosed to years before. The patient's mother was diagnosed at age 47 with fallopian tube carcinoma. She died at age 12. There is no Gabrielle history of breast or ovarian cancer in the family to the patient's knowledge  GYNECOLOGIC HISTORY:  No LMP recorded. Patient has had a hysterectomy. Menarche age 68, first live birth age 26. The patient is GX P2. She underwent hysterectomy at a relatively young age, without salpingo-oophorectomy. She did not take hormone replacement however she took birth control pills remotely for approximately 2 years with no complications.  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 She is divorced. Her son Magnolia Mattila lives in [place] and works as a Metallurgist. Daughter Kinzey Sheriff lives in [place] where she works as a Architect for the [employer]. Incidentally Dr. Kathe Becton is a nephew of the patient    ADVANCED DIRECTIVES: In place; the patient's son Yvone Neu is her  healthcare power of attorney. He can be reached at [phone]: Social History   Tobacco Use  . Smoking status: Current Every Day Smoker    Packs/day: 0.50    Years: 60.00    Pack years: 30.00    Types: Cigarettes  . Smokeless tobacco: Never Used  . Tobacco comment: 1/2 ppd 4.4.2019  Substance Use Topics  . Alcohol use: Yes    Alcohol/week: 2.0 standard drinks    Types: 2 Glasses of wine per week    Comment: occasional  . Drug use: No     Colonoscopy: Never  PAP:  Bone density: September 2016: Osteoporosis  Lipid panel:  No Known Allergies  Current Outpatient Medications  Medication Sig Dispense Refill  . albuterol (VENTOLIN HFA) 108 (90 Base) MCG/ACT inhaler Inhale 2 puffs into the lungs every 6 (six) hours as needed for wheezing or shortness of breath. 1 Inhaler 0  . anastrozole (ARIMIDEX) 1 MG tablet TAKE 1 TABLET IN THE MORNING. 84 tablet 0  .  azithromycin (ZITHROMAX) 250 MG tablet 2 tabs po qd x 1d, then 1 tab po qd x 4d 6 tablet 0  . Cholecalciferol 2000 units CAPS Take 1 capsule (2,000 Units total) by mouth daily. 90 each 1  . cyanocobalamin 1000 MCG tablet Take 1,000 mcg by mouth daily.    Gabrielle [name] Kitchen donepezil (ARICEPT) 10 MG tablet Take 1 tablets daily. 90 tablet 3  . mirtazapine (REMERON) 7.5 MG tablet Take 1 tablet (7.5 mg total) by mouth at bedtime. 90 tablet 1  . Multiple Vitamins-Minerals (PRESERVISION AREDS 2 PO) Take 1 tablet by mouth 2 (two) times daily.    . predniSONE (DELTASONE) 20 MG tablet 2 tabs po qd x 5d, then 1 tab po qd x 5d 15 tablet 0  . QC LO-DOSE ASPIRIN 81 MG EC tablet TAKE 1 TABLET IN THE MORNING. 30 tablet 5  . Tiotropium Bromide Monohydrate (SPIRIVA RESPIMAT) 2.5 MCG/ACT AERS Inhale 2 puffs into the lungs daily. 1 Inhaler 5   No current facility-administered medications for this visit.     OBJECTIVE: Elderly white woman  There were no vitals filed for this visit.   There is no height or weight on file to calculate BMI.    ECOG FS:2 - Symptomatic, <50% confined to bed   LAB RESULTS:  CMP     Component Value Date/Time   NA 143 12/13/2016 1259   K 4.1 12/13/2016 1259   CL 101 09/07/2016 1704   CO2 30 (H) 12/13/2016 1259   GLUCOSE 83 12/13/2016 1259   BUN 19.2 12/13/2016 1259   CREATININE 0.8 12/13/2016 1259   CALCIUM 9.4 12/13/2016 1259   PROT 6.7 12/13/2016 1259   ALBUMIN 3.4 (L) 12/13/2016 1259   AST 25 12/13/2016 1259   ALT 21 12/13/2016 1259   ALKPHOS 89 12/13/2016 1259   BILITOT 0.32 12/13/2016 1259   GFRNONAA 79 (L) 12/19/2013 0425   GFRAA >90 12/19/2013 0425    I No results found for: SPEP  Lab Results  Component Value Date   WBC 7.0 12/13/2016   NEUTROABS 4.7 12/13/2016   HGB 13.5 12/13/2016   HCT 40.4 12/13/2016   MCV 99.1 12/13/2016   PLT 254 12/13/2016      Chemistry      Component Value Date/Time   NA 143 12/13/2016 1259   K 4.1 12/13/2016 1259   CL 101 09/07/2016  1704   CO2 30 (H) 12/13/2016 1259   BUN 19.2 12/13/2016 1259  CREATININE 0.8 12/13/2016 1259      Component Value Date/Time   CALCIUM 9.4 12/13/2016 1259   ALKPHOS 89 12/13/2016 1259   AST 25 12/13/2016 1259   ALT 21 12/13/2016 1259   BILITOT 0.32 12/13/2016 1259       No results found for: LABCA2  No components found for: LABCA125  No results for input(s): INR in the last 168 hours.  Urinalysis    Component Value Date/Time   COLORURINE YELLOW 10/05/2013 1158   APPEARANCEUR Cloudy (A) 10/05/2013 1158   LABSPEC 1.010 10/05/2013 1158   PHURINE 8.5 (A) 10/05/2013 1158   GLUCOSEU NEGATIVE 10/05/2013 1158   HGBUR NEGATIVE 10/05/2013 1158   BILIRUBINUR neg 01/22/2014 1203   KETONESUR NEGATIVE 10/05/2013 1158   PROTEINUR neg 01/22/2014 1203   UROBILINOGEN 0.2 01/22/2014 1203   UROBILINOGEN 0.2 10/05/2013 1158   NITRITE positive 01/22/2014 1203   NITRITE NEGATIVE 10/05/2013 1158   LEUKOCYTESUR small (1+) 01/22/2014 1203    STUDIES: Since her last visit, she underwent screening unilateral left breast mammography with CAD and tomography on 09/14/2017 at [facility] showing: breast density category C. There was no evidence of malignancy.  She also completed a Chest CT on 06/01/2017 showing: Continued interval improvement in multifocal pulmonary opacities. Residua identified in the posterior right upper lobe, right middle lobe, and to a modest degree in each lower lobe may reflect incomplete resolution, evolving scar, or recurrent disease. Features may be related to atypical infection although aspiration can have this appearance.  Emphysema. (IPP89-Q42.9)  Aortic Atherosclerois (ICD10-170.0  ASSESSMENT: 84 y.o. BRCA negative [place] woman status post right breast upper outer quadrant biopsy 11/26/2013 for a clinical T2 N0, stage IIA invasive ductal carcinoma, grade 2, with micropapillary features, triple positive, with an MIB-1 of 31%  (1) right simple mastectomy  12/18/2013 showed a pT2 pNX, stage II invasive ductal carcinoma, grade 2, with negative margins.  (2) 6 mm right lower lobe nodule noted on CT scans 11/19/2013  (a) continuing tobacco abuse-  (b) emphysema  (c) most recent chest CT 11/24/2016 shows no evidence of malignancy  (3) progressive weight loss: stabilized  (4) started anastrozole 01/03/2014  (a) denosumab/Prolia discussed with the patient 12/13/2016  (b) osteoporosis- Bone density 11/25/2014 showed a T score of -2.8  (5) started trastuzumab 02/05/2014; completed one year, last dose 02/27/2015  (a) echo  02/27/2015 shows an EF of 60-65%   (6) tobacco abuse disorder: the patient has been strongly advised to discontinue smoking  PLAN: No-show letter sent requesting reschedule.  Sekou Zuckerman, Gabrielle Dad, MD  12/19/17 8:19 AM Medical Oncology and Hematology [facility] [address] [place], [place] [address] Tel. [phone]    Fax. [phone]  IWilburn Mylar, am acting as scribe for Chauncey Cruel MD.  I, Lurline Del MD, have reviewed the above documentation for accuracy and completeness, and I agree with the above.

## 2017-12-20 ENCOUNTER — Encounter: Payer: Self-pay | Admitting: Family Medicine

## 2017-12-20 ENCOUNTER — Telehealth: Payer: Self-pay

## 2017-12-20 NOTE — Telephone Encounter (Signed)
Spoke with patient daughter concerning r/s appointment. [name] requested that the patient only see Mag[facility]. Rescheduled appointment mailed a letter with a calender enclosed. Per 10/22 phone msg return call

## 2017-12-23 ENCOUNTER — Other Ambulatory Visit: Payer: Medicare HMO

## 2017-12-23 ENCOUNTER — Ambulatory Visit: Payer: Medicare HMO | Admitting: Adult Health

## 2017-12-27 ENCOUNTER — Ambulatory Visit (INDEPENDENT_AMBULATORY_CARE_PROVIDER_SITE_OTHER): Payer: Medicare HMO

## 2017-12-27 DIAGNOSIS — Z23 Encounter for immunization: Secondary | ICD-10-CM

## 2018-02-13 NOTE — Progress Notes (Signed)
[name]  [phone] [phone]     ID: LOVETTA CONDIE DOB: Jun 29, 1930  MR#: [id number]  PYP#:[id number]  Patient Care Team: Ma Hillock, DO as PCP - General (Family Medicine) Excell Seltzer, MD as Consulting Physician (General Surgery) Reygan Heagle, Virgie Dad, MD as Consulting Physician (Oncology) Arloa Koh, MD as Consulting Physician (Radiation Oncology) Jarome Matin, MD as Consulting Physician (Dermatology) Brand Males, MD as Consulting Physician (Pulmonary Disease) Alda Berthold, DO as Consulting Physician (Neurology) Armbruster, Carlota Raspberry, MD as Consulting Physician (Gastroenterology) OTHER MD: Joylene John, DDS  CHIEF COMPLAINT: Triple positive breast cancer  CURRENT TREATMENT:  Anastrozole  BREAST CANCER HISTORY: From the original intake note:  Gabrielle [name] had not been to a doctor for "more than 20 years. Since Christmas 2014 and the family has become increasingly concerned that she appeared to be losing weight and was a bit more confused. They asked the patient how much weight she had lost and she said she had lost "about 50 pounds.". However, the only weight we have available before August of this year is from August of 2014 and it was 115 pounds at that time.  Nevertheless with this concern the patient agreed to be seen by a physician and on 10/06/2013 she was seen by Dr. Kathlen Mody who obtained a full battery of tests. He found the patient to have a urinary tract infection with Escherichia coli, which may explain some of her confusion. Vitamin B 12 level was 215, which is in the low normal range. Hemoglobin was 14.7 with an MCV of 99.0. The patient was started on B12 supplementation parenterally and a chest x-ray was obtained 10/05/2013 which showed evidence of emphysema and prior granulomatous disease. To make sure an occult cancer was not being missed a CT scan of the chest abdomen and pelvis was obtained 11/19/2013. This showed a  calcified granuloma in the inferior lingula. There was also a noncalcified 6 mm nodule in the lateral portion of the right lower lobe. There was no mediastinal mass or adenopathy. There were calcified granulomata in the spleen as well. There was significant atherosclerotic calcifications. There was sigmoid diverticulosis noted incidentally. In addition, a 1.8 cm right breast mass was noted.  The breast mass was evaluated further with bilateral diagnostic mammography and right breast ultrasonography of the breast Center 11/26/2013. The breast density was category C. There was indeed an irregular mass in the right breast which was on palpation firm nontender and noted at the 10:30 position. Ultrasound confirmed a hypoechoic irregular mass measuring up to 3 cm. There was no right axillary adenopathy noted.  Biopsy of the right breast mass in question 11/26/2013 showed ([id number]) and invasive ductal carcinoma, grade 2, estrogen receptor 100% positive, progesterone receptor 59% positive, both with strong staining intensity, with an MIB-1 of 31%, and with HER-2 amplification, the signals ratio being 2.79 and the copy number per cell 5.45.  On 11-2013 the patient underwent bilateral breast MRI. This showed the enhancing mass in the upper-outer quadrant of the right breast to measure 2.1 cm. There was no other findings in the right breast, left breast, or either axilla.  The patient's subsequent history is as detailed below  INTERVAL HISTORY: [name] returns today for follow-up of her triple positive breast cancer.   The patient continues on anastrozole, which she is tolerating well. She experiences neither hot flashes nor vaginal dryness.   Since her last visit, she underwent screening unilateral left breast mammography with CAD and tomography on  09/14/2017 at [facility] showing: breast density category C. There was no evidence of malignancy.  She also completed a Chest CT on 06/01/2017 showing:  Continued interval improvement in multifocal pulmonary opacities. Residua identified in the posterior right upper lobe, right middle lobe, and to a modest degree in each lower lobe may reflect incomplete resolution, evolving scar, or recurrent disease. Features may be related to atypical infection although aspiration can have this appearance.  Emphysema. ([id number])  Aortic Atherosclerois (ICD10-170.0)  Finally, she underwent a DEXA scan on 03/22/2017, showing a T-score of -3.1, which is considered osteoporotic.    REVIEW OF SYSTEMS: [name] is doing well overall. For Thanksgiving, she went to her daughter's house in [place].  The patient had bronchitis recently. The patient denies unusual headaches, visual changes, nausea, vomiting, or dizziness. There has been no phlegm production. This been no change in bowel or bladder habits. The patient denies unexplained fatigue or unexplained weight loss, bleeding, rash, or fever. A detailed review of systems was otherwise noncontributory.    PAST MEDICAL HISTORY: Past Medical History:  Diagnosis Date  . Anxiety   . Breast cancer of upper-outer quadrant of right female breast ([place]) 10/2013   Anastrazole since 12/2013--plan is to take this for 5 yrs.  No evidence of dz recurrence as of 11/2015 oncol f/u.  Marland Kitchen Cataract   . COPD (chronic obstructive pulmonary disease) ([place])   . Macular degeneration   . Osteoporosis 10/2014   T-score -2.8  . Pneumonia    hx of   . Retina hole   . Skin cancer   . Stroke ([place])   . Tobacco dependence     PAST SURGICAL HISTORY: Past Surgical History:  Procedure Laterality Date  . ABDOMINAL HYSTERECTOMY  1980   (ovaries intact)  . APPENDECTOMY    . BREAST BIOPSY Right   . CATARACT EXTRACTION Bilateral   . MASTECTOMY Right   . MOHS SURGERY     Dr. Sarajane Jews  . PARS PLANA VITRECTOMY W/ REPAIR OF MACULAR HOLE Right   . RETINAL LASER PROCEDURE    . TOTAL MASTECTOMY Right 12/18/2013   Procedure: RIGHT TOTAL  MASTECTOMY;  Surgeon: Excell Seltzer, MD;  Location: [facility];  Service: General;  Laterality: Right;    FAMILY HISTORY Family History  Problem Relation Age of Onset  . Heart attack Mother        Deceased  . Cancer Mother        fallopian tube cancer in 23s; deceased 52  . Throat cancer Father        Deceased 68; smoker  . Diabetes Brother   . Prostate cancer Brother 41       Currently 32  . Other Brother        Deceased, hemorhhage of pancreas  . Leukemia Maternal Aunt   . Breast cancer Paternal Aunt        age at diagnosis unknown  . Throat cancer Paternal Uncle        Deceased 3s; smoker  . Leukemia Paternal Grandmother    the patient's father died at the age of 18 from throat cancer which has been diagnosed to years before. The patient's mother was diagnosed at age 20 with fallopian tube carcinoma. She died at age 71. There is no other history of breast or ovarian cancer in the family to the patient's knowledge  GYNECOLOGIC HISTORY:  No LMP recorded. Patient has had a hysterectomy. Menarche age 28, first live birth age 55. The patient is  Gabrielle [name] P2. She underwent hysterectomy at a relatively young age, without salpingo-oophorectomy. She did not take hormone replacement however she took birth control pills remotely for approximately 2 years with no complications.  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 She is divorced. Her son Irmgard Rampersaud lives in [place] and works as a Metallurgist. Daughter Mariadelcarmen Corella lives in [place] where she works as a Architect for the [employer]. Incidentally Dr. [name] is a nephew of the patient    ADVANCED DIRECTIVES: In place; the patient's son Yvone Neu is her healthcare power of attorney. He can be reached at 336- [place]: Social History   Tobacco Use  . Smoking status: Current Every Day Smoker    Packs/day: 0.50    Years: 60.00    Pack  years: 30.00    Types: Cigarettes  . Smokeless tobacco: Never Used  . Tobacco comment: 1/2 ppd 4.4.2019  Substance Use Topics  . Alcohol use: Yes    Alcohol/week: 2.0 standard drinks    Types: 2 Glasses of wine per week    Comment: occasional  . Drug use: No     Colonoscopy: Never  PAP:  Bone density: September 2016: Osteoporosis  Lipid panel:  No Known Allergies  Current Outpatient Medications  Medication Sig Dispense Refill  . albuterol (VENTOLIN HFA) 108 (90 Base) MCG/ACT inhaler Inhale 2 puffs into the lungs every 6 (six) hours as needed for wheezing or shortness of breath. 1 Inhaler 0  . anastrozole (ARIMIDEX) 1 MG tablet Take 1 tablet (1 mg total) by mouth every morning. 90 tablet 4  . Cholecalciferol 2000 units CAPS Take 1 capsule (2,000 Units total) by mouth daily. 90 each 1  . cyanocobalamin 1000 MCG tablet Take 1,000 mcg by mouth daily.    Marland Kitchen donepezil (ARICEPT) 10 MG tablet Take 1 tablets daily. 90 tablet 3  . mirtazapine (REMERON) 7.5 MG tablet Take 1 tablet (7.5 mg total) by mouth at bedtime. 90 tablet 1  . Multiple Vitamins-Minerals (PRESERVISION AREDS 2 PO) Take 1 tablet by mouth 2 (two) times daily.    . QC LO-DOSE ASPIRIN 81 MG EC tablet TAKE 1 TABLET IN THE MORNING. 30 tablet 5  . Tiotropium Bromide Monohydrate (SPIRIVA RESPIMAT) 2.5 MCG/ACT AERS Inhale 2 puffs into the lungs daily. 1 Inhaler 5   No current facility-administered medications for this visit.     OBJECTIVE: Elderly white woman who appears stated age 83:   02/14/18 1402  BP: 123/77  Pulse: 78  Resp: 18  Temp: 97.8 F (36.6 C)  SpO2: 97%     Body mass index is 19.72 kg/m.    ECOG FS:2 - Symptomatic, <50% confined to bed  Sclerae unicteric, EOMs intact Oropharynx shows poor dentition, with upper and lower plates No cervical or supraclavicular adenopathy Lungs no rales or rhonchi Heart regular rate and rhythm Abd soft, nontender, positive bowel sounds MSK kyphosis but no focal spinal  tenderness, no upper extremity lymphedema Neuro: nonfocal, well oriented, appropriate affect Breasts: The right breast is status post mastectomy.  There is no evidence of local recurrence.  The left breast is unremarkable.  Both axillae are benign.  LAB RESULTS:  CMP     Component Value Date/Time   NA 145 02/14/2018 1348   NA 143 12/13/2016 1259   K 4.1 02/14/2018 1348   K 4.1 12/13/2016 1259   CL 105 02/14/2018 1348  CO2 31 02/14/2018 1348   CO2 30 (H) 12/13/2016 1259   GLUCOSE 97 02/14/2018 1348   GLUCOSE 83 12/13/2016 1259   BUN 16 02/14/2018 1348   BUN 19.2 12/13/2016 1259   CREATININE 0.87 02/14/2018 1348   CREATININE 0.8 12/13/2016 1259   CALCIUM 9.6 02/14/2018 1348   CALCIUM 9.4 12/13/2016 1259   PROT 6.7 02/14/2018 1348   PROT 6.7 12/13/2016 1259   ALBUMIN 3.6 02/14/2018 1348   ALBUMIN 3.4 (L) 12/13/2016 1259   AST 17 02/14/2018 1348   AST 25 12/13/2016 1259   ALT 11 02/14/2018 1348   ALT 21 12/13/2016 1259   ALKPHOS 82 02/14/2018 1348   ALKPHOS 89 12/13/2016 1259   BILITOT 0.7 02/14/2018 1348   BILITOT 0.32 12/13/2016 1259   GFRNONAA >60 02/14/2018 1348   GFRAA >60 02/14/2018 1348    I No results found for: SPEP  Lab Results  Component Value Date   WBC 6.8 02/14/2018   NEUTROABS 4.3 02/14/2018   HGB 13.9 02/14/2018   HCT 41.9 02/14/2018   MCV 99.3 02/14/2018   PLT 245 02/14/2018      Chemistry      Component Value Date/Time   NA 145 02/14/2018 1348   NA 143 12/13/2016 1259   K 4.1 02/14/2018 1348   K 4.1 12/13/2016 1259   CL 105 02/14/2018 1348   CO2 31 02/14/2018 1348   CO2 30 (H) 12/13/2016 1259   BUN 16 02/14/2018 1348   BUN 19.2 12/13/2016 1259   CREATININE 0.87 02/14/2018 1348   CREATININE 0.8 12/13/2016 1259      Component Value Date/Time   CALCIUM 9.6 02/14/2018 1348   CALCIUM 9.4 12/13/2016 1259   ALKPHOS 82 02/14/2018 1348   ALKPHOS 89 12/13/2016 1259   AST 17 02/14/2018 1348   AST 25 12/13/2016 1259   ALT 11 02/14/2018  1348   ALT 21 12/13/2016 1259   BILITOT 0.7 02/14/2018 1348   BILITOT 0.32 12/13/2016 1259       No results found for: LABCA2  No components found for: LABCA125  No results for input(s): INR in the last 168 hours.  Urinalysis    Component Value Date/Time   COLORURINE YELLOW 10/05/2013 1158   APPEARANCEUR Cloudy (A) 10/05/2013 1158   LABSPEC 1.010 10/05/2013 1158   PHURINE 8.5 (A) 10/05/2013 1158   GLUCOSEU NEGATIVE 10/05/2013 1158   HGBUR NEGATIVE 10/05/2013 1158   BILIRUBINUR neg 01/22/2014 1203   KETONESUR NEGATIVE 10/05/2013 1158   PROTEINUR neg 01/22/2014 1203   UROBILINOGEN 0.2 01/22/2014 1203   UROBILINOGEN 0.2 10/05/2013 1158   NITRITE positive 01/22/2014 1203   NITRITE NEGATIVE 10/05/2013 1158   LEUKOCYTESUR small (1+) 01/22/2014 1203    STUDIES: Mammography and bone density results discussed with the patient  ASSESSMENT: 84 y.o. BRCA negative [place] woman status post right breast upper outer quadrant biopsy 11/26/2013 for a clinical T2 N0, stage IIA invasive ductal carcinoma, grade 2, with micropapillary features, triple positive, with an MIB-1 of 31%  (1) right simple mastectomy 12/18/2013 showed a pT2 pNX, stage II invasive ductal carcinoma, grade 2, with negative margins.  (2) 6 mm right lower lobe nodule noted on CT scans 11/19/2013  (a) continuing tobacco abuse-  (b) emphysema  (c) most recent chest CT 11/24/2016 shows no evidence of malignancy  (3) progressive weight loss: stabilized  (4) started anastrozole 01/03/2014  (a) denosumab/Prolia discussed with the patient 12/13/2016  (b) osteoporosis- Bone density 11/25/2014 showed a T score of -2.8  (  c) repeat bone density 03/22/2017 shows a T score of -3.1  (5) started trastuzumab 02/05/2014; completed one year, last dose 02/27/2015  (a) echo  02/27/2015 shows an EF of 60-65%   (6) tobacco abuse disorder: the patient has been strongly advised to discontinue smoking   PLAN: Diyana is now just  over 4 years out from definitive surgery for her breast cancer with no evidence of disease recurrence.  This is very favorable.  She is tolerating anastrozole well and the plan is to continue that for total of 5 years, which means at her next visit, a year from now, she will be ready to graduate  We did discuss her bone density results and she understands she has osteoporosis and that it is a little bit worse than it was 3 years ago.  I would consider bisphosphonates or denosumab but I am concerned that her teeth are not in good repair and she has not been to her dentist in some time.  I suggested she discuss this with her dentist and if he gives her clearance then she can discuss this further with her primary care physician  Otherwise Leasia will see me again one last time a year from now.  She knows to call for any other issues that may develop before that visit.  Tandy Lewin, Virgie Dad, MD  02/14/18 2:34 PM Medical Oncology and Hematology [facility] [address] [place], [place] [address] Tel. [phone]    Fax. [phone]   I, Jacqualyn Posey am acting as a Education administrator for Chauncey Cruel, MD.   I, Lurline Del MD, have reviewed the above documentation for accuracy and completeness, and I agree with the above.

## 2018-02-14 ENCOUNTER — Inpatient Hospital Stay: Payer: Medicare HMO | Attending: Oncology

## 2018-02-14 ENCOUNTER — Inpatient Hospital Stay (HOSPITAL_BASED_OUTPATIENT_CLINIC_OR_DEPARTMENT_OTHER): Payer: Medicare HMO | Admitting: Oncology

## 2018-02-14 VITALS — BP 123/77 | HR 78 | Temp 97.8°F | Resp 18 | Ht 64.0 in | Wt 114.9 lb

## 2018-02-14 DIAGNOSIS — Z806 Family history of leukemia: Secondary | ICD-10-CM

## 2018-02-14 DIAGNOSIS — Z170 Estrogen receptor positive status [ER+]: Secondary | ICD-10-CM

## 2018-02-14 DIAGNOSIS — Z7982 Long term (current) use of aspirin: Secondary | ICD-10-CM

## 2018-02-14 DIAGNOSIS — Z79811 Long term (current) use of aromatase inhibitors: Secondary | ICD-10-CM | POA: Diagnosis not present

## 2018-02-14 DIAGNOSIS — Z90710 Acquired absence of both cervix and uterus: Secondary | ICD-10-CM

## 2018-02-14 DIAGNOSIS — M810 Age-related osteoporosis without current pathological fracture: Secondary | ICD-10-CM

## 2018-02-14 DIAGNOSIS — F17210 Nicotine dependence, cigarettes, uncomplicated: Secondary | ICD-10-CM | POA: Diagnosis not present

## 2018-02-14 DIAGNOSIS — C50411 Malignant neoplasm of upper-outer quadrant of right female breast: Secondary | ICD-10-CM

## 2018-02-14 DIAGNOSIS — Z803 Family history of malignant neoplasm of breast: Secondary | ICD-10-CM | POA: Diagnosis not present

## 2018-02-14 DIAGNOSIS — Z8049 Family history of malignant neoplasm of other genital organs: Secondary | ICD-10-CM | POA: Insufficient documentation

## 2018-02-14 DIAGNOSIS — Z9011 Acquired absence of right breast and nipple: Secondary | ICD-10-CM

## 2018-02-14 DIAGNOSIS — Z79899 Other long term (current) drug therapy: Secondary | ICD-10-CM

## 2018-02-14 DIAGNOSIS — I700 Atherosclerosis of aorta: Secondary | ICD-10-CM | POA: Diagnosis not present

## 2018-02-14 LAB — CBC WITH DIFFERENTIAL (CANCER CENTER ONLY)
ABS IMMATURE GRANULOCYTES: 0.01 10*3/uL (ref 0.00–0.07)
BASOS PCT: 1 %
Basophils Absolute: 0 10*3/uL (ref 0.0–0.1)
EOS ABS: 0.1 10*3/uL (ref 0.0–0.5)
Eosinophils Relative: 2 %
HCT: 41.9 % (ref 36.0–46.0)
Hemoglobin: 13.9 g/dL (ref 12.0–15.0)
IMMATURE GRANULOCYTES: 0 %
Lymphocytes Relative: 27 %
Lymphs Abs: 1.8 10*3/uL (ref 0.7–4.0)
MCH: 32.9 pg (ref 26.0–34.0)
MCHC: 33.2 g/dL (ref 30.0–36.0)
MCV: 99.3 fL (ref 80.0–100.0)
MONOS PCT: 8 %
Monocytes Absolute: 0.6 10*3/uL (ref 0.1–1.0)
NEUTROS ABS: 4.3 10*3/uL (ref 1.7–7.7)
NEUTROS PCT: 62 %
PLATELETS: 245 10*3/uL (ref 150–400)
RBC: 4.22 MIL/uL (ref 3.87–5.11)
RDW: 12.7 % (ref 11.5–15.5)
WBC: 6.8 10*3/uL (ref 4.0–10.5)
nRBC: 0 % (ref 0.0–0.2)

## 2018-02-14 LAB — CMP (CANCER CENTER ONLY)
ALBUMIN: 3.6 g/dL (ref 3.5–5.0)
ALK PHOS: 82 U/L (ref 38–126)
ALT: 11 U/L (ref 0–44)
ANION GAP: 9 (ref 5–15)
AST: 17 U/L (ref 15–41)
BILIRUBIN TOTAL: 0.7 mg/dL (ref 0.3–1.2)
BUN: 16 mg/dL (ref 8–23)
CALCIUM: 9.6 mg/dL (ref 8.9–10.3)
CO2: 31 mmol/L (ref 22–32)
Chloride: 105 mmol/L (ref 98–111)
Creatinine: 0.87 mg/dL (ref 0.44–1.00)
GFR, Est AFR Am: >60 mL/min (ref >60)
GFR, Estimated: >60 mL/min (ref >60)
GLUCOSE: 97 mg/dL (ref 70–99)
POTASSIUM: 4.1 mmol/L (ref 3.5–5.1)
Sodium: 145 mmol/L (ref 135–145)
TOTAL PROTEIN: 6.7 g/dL (ref 6.5–8.1)

## 2018-02-14 MED ORDER — ANASTROZOLE 1 MG PO TABS: 1.0000 mg | ORAL_TABLET | Freq: Every morning | ORAL | 4 refills | Status: DC

## 2018-03-27 ENCOUNTER — Other Ambulatory Visit: Payer: Self-pay | Admitting: Internal Medicine

## 2018-03-27 ENCOUNTER — Other Ambulatory Visit: Payer: Self-pay | Admitting: Oncology

## 2018-05-09 ENCOUNTER — Ambulatory Visit: Payer: Medicare HMO | Admitting: Neurology

## 2018-05-15 ENCOUNTER — Ambulatory Visit: Payer: Medicare HMO | Admitting: Neurology

## 2018-05-22 ENCOUNTER — Telehealth: Payer: Self-pay | Admitting: Internal Medicine

## 2018-05-22 NOTE — Telephone Encounter (Signed)
Received urgent staff message from Kandra Nicolas needing to know if pt's CT which is currently scheduled for 4/1 is urgent or if it can be rescheduled.  Also, pt has an OV scheduled 4/3. Can this also be rescheduled?   MR, please advise. Thanks!

## 2018-05-23 NOTE — Telephone Encounter (Signed)
Move to July/augu 2020

## 2018-05-23 NOTE — Telephone Encounter (Signed)
Spoke with pt and advised that we will reschedule her CT and appt with Dr Chase Caller in July 2020.  CT cancelled.  Reminder placed for appt to be rescheduled.  Pt verbalized understanding.  Nothing further needed.

## 2018-05-31 ENCOUNTER — Inpatient Hospital Stay: Admission: RE | Admit: 2018-05-31 | Payer: Medicare HMO | Source: Ambulatory Visit

## 2018-06-02 ENCOUNTER — Ambulatory Visit: Payer: Medicare HMO | Admitting: Neurology

## 2018-06-02 ENCOUNTER — Encounter: Payer: Medicare HMO | Admitting: Family Medicine

## 2018-06-02 ENCOUNTER — Ambulatory Visit: Payer: Medicare HMO | Admitting: Internal Medicine

## 2018-06-21 ENCOUNTER — Ambulatory Visit: Payer: Medicare HMO | Admitting: Neurology

## 2018-06-24 ENCOUNTER — Other Ambulatory Visit: Payer: Self-pay | Admitting: Neurology

## 2018-06-24 ENCOUNTER — Other Ambulatory Visit: Payer: Self-pay | Admitting: Oncology

## 2018-06-27 ENCOUNTER — Other Ambulatory Visit: Payer: Self-pay | Admitting: *Deleted

## 2018-06-28 ENCOUNTER — Other Ambulatory Visit: Payer: Self-pay | Admitting: Neurology

## 2018-07-10 ENCOUNTER — Encounter: Payer: Medicare HMO | Admitting: Family Medicine

## 2018-07-14 ENCOUNTER — Telehealth: Payer: Self-pay | Admitting: Internal Medicine

## 2018-07-14 DIAGNOSIS — R911 Solitary pulmonary nodule: Secondary | ICD-10-CM

## 2018-07-14 NOTE — Telephone Encounter (Signed)
Received staff message from [place] with [facility]:  Calling to RS pt that were CX due to COVID Restrictions the CT order was CX can you put in a new order?   MRN [id number]  Name Gabrielle [name], Gabrielle [name]   Order has been placed for CT. Nothing further needed.

## 2018-07-31 ENCOUNTER — Other Ambulatory Visit: Payer: Self-pay | Admitting: Neurology

## 2018-08-02 ENCOUNTER — Other Ambulatory Visit: Payer: Self-pay | Admitting: Neurology

## 2018-08-04 ENCOUNTER — Encounter: Payer: Medicare HMO | Admitting: Family Medicine

## 2018-08-04 ENCOUNTER — Inpatient Hospital Stay: Admission: RE | Admit: 2018-08-04 | Payer: Medicare HMO | Source: Ambulatory Visit

## 2018-08-07 ENCOUNTER — Telehealth: Payer: Self-pay | Admitting: Family Medicine

## 2018-08-07 NOTE — Telephone Encounter (Signed)
I spoke with the patients daughter at length Friday afternoon. Gabrielle [name] was very understanding and accepting of my apologies. After some investigation it seems that an agent from the [facility] told the daughter that we had never had a Covid patient in this office and then it was 100% safe for the patient to come in. Agent  also confirmed the appointment but it had already been canceled.  I also explained to the daughter who wanted her mother to have her physical and her annual wellness visit on the same day that they are both very lengthy appointments and it would extend the period of time her mother was in the office. Based on that, she would like to have her mothers annual wellness visit scheduled at the end of September or beginning of October when her mother can receive both her flu and pneumonia shot. Advised the daughter that our health coach would reach out to her to schedule. Please call Gabrielle [name] at [phone] to schedule.

## 2018-08-08 NOTE — Telephone Encounter (Signed)
SW daughter [name]) regarding AWV vs CPE. Daughter requesting AWV in October so patient can receive annual flu vaccine at visit.  [name] driving at time of call, request call back on 08/09/2018 to schedule AWV with Health Coach.

## 2018-08-09 NOTE — Telephone Encounter (Signed)
LM with daughter requesting call back to schedule AWV in October.

## 2018-09-01 ENCOUNTER — Other Ambulatory Visit: Payer: Self-pay | Admitting: Oncology

## 2018-09-01 ENCOUNTER — Other Ambulatory Visit: Payer: Self-pay | Admitting: Neurology

## 2018-09-27 ENCOUNTER — Other Ambulatory Visit: Payer: Self-pay | Admitting: Neurology

## 2018-09-27 ENCOUNTER — Other Ambulatory Visit: Payer: Self-pay | Admitting: Oncology

## 2018-10-03 ENCOUNTER — Encounter: Payer: Medicare HMO | Admitting: Family Medicine

## 2018-10-27 ENCOUNTER — Other Ambulatory Visit: Payer: Self-pay | Admitting: Oncology

## 2018-10-27 ENCOUNTER — Other Ambulatory Visit: Payer: Self-pay | Admitting: Neurology

## 2018-10-30 NOTE — Telephone Encounter (Signed)
[facility] patient

## 2018-10-30 NOTE — Telephone Encounter (Signed)
Please inform patient that it has been greater than 1 year since her last visit and she needs to schedule follow-up/telephone visit.  I will give her 30-day supply of donepezil, but she will need appointment, even telephone call, for additional refills.

## 2018-11-12 ENCOUNTER — Emergency Department (HOSPITAL_BASED_OUTPATIENT_CLINIC_OR_DEPARTMENT_OTHER): Payer: Medicare HMO

## 2018-11-12 ENCOUNTER — Emergency Department (HOSPITAL_BASED_OUTPATIENT_CLINIC_OR_DEPARTMENT_OTHER)
Admission: EM | Admit: 2018-11-12 | Discharge: 2018-11-12 | Disposition: A | Discharge status: Home / Self Care | Payer: Medicare HMO | Attending: Emergency Medicine | Admitting: Emergency Medicine

## 2018-11-12 ENCOUNTER — Encounter (HOSPITAL_BASED_OUTPATIENT_CLINIC_OR_DEPARTMENT_OTHER): Payer: Self-pay | Admitting: Emergency Medicine

## 2018-11-12 ENCOUNTER — Other Ambulatory Visit: Payer: Self-pay

## 2018-11-12 DIAGNOSIS — M7989 Other specified soft tissue disorders: Secondary | ICD-10-CM

## 2018-11-12 DIAGNOSIS — Z79899 Other long term (current) drug therapy: Secondary | ICD-10-CM | POA: Diagnosis not present

## 2018-11-12 DIAGNOSIS — J449 Chronic obstructive pulmonary disease, unspecified: Secondary | ICD-10-CM | POA: Diagnosis not present

## 2018-11-12 DIAGNOSIS — L989 Disorder of the skin and subcutaneous tissue, unspecified: Secondary | ICD-10-CM | POA: Insufficient documentation

## 2018-11-12 DIAGNOSIS — R2241 Localized swelling, mass and lump, right lower limb: Secondary | ICD-10-CM | POA: Insufficient documentation

## 2018-11-12 DIAGNOSIS — M79606 Pain in leg, unspecified: Secondary | ICD-10-CM | POA: Diagnosis not present

## 2018-11-12 DIAGNOSIS — M79661 Pain in right lower leg: Secondary | ICD-10-CM | POA: Diagnosis not present

## 2018-11-12 DIAGNOSIS — Z8673 Personal history of transient ischemic attack (TIA), and cerebral infarction without residual deficits: Secondary | ICD-10-CM | POA: Diagnosis not present

## 2018-11-12 DIAGNOSIS — F17210 Nicotine dependence, cigarettes, uncomplicated: Secondary | ICD-10-CM | POA: Insufficient documentation

## 2018-11-12 DIAGNOSIS — R238 Other skin changes: Secondary | ICD-10-CM

## 2018-11-12 DIAGNOSIS — Z853 Personal history of malignant neoplasm of breast: Secondary | ICD-10-CM | POA: Diagnosis not present

## 2018-11-12 LAB — CBC WITH DIFFERENTIAL/PLATELET
Abs Immature Granulocytes: 0.02 10*3/uL (ref 0.00–0.07)
Basophils Absolute: 0.1 10*3/uL (ref 0.0–0.1)
Basophils Relative: 1 %
Eosinophils Absolute: 0.1 10*3/uL (ref 0.0–0.5)
Eosinophils Relative: 1 %
HCT: 42 % (ref 36.0–46.0)
Hemoglobin: 13.5 g/dL (ref 12.0–15.0)
Immature Granulocytes: 0 %
Lymphocytes Relative: 20 %
Lymphs Abs: 1.5 10*3/uL (ref 0.7–4.0)
MCH: 31.4 pg (ref 26.0–34.0)
MCHC: 32.1 g/dL (ref 30.0–36.0)
MCV: 97.7 fL (ref 80.0–100.0)
Monocytes Absolute: 0.9 10*3/uL (ref 0.1–1.0)
Monocytes Relative: 12 %
Neutro Abs: 5.2 10*3/uL (ref 1.7–7.7)
Neutrophils Relative %: 66 %
Platelets: 256 10*3/uL (ref 150–400)
RBC: 4.3 MIL/uL (ref 3.87–5.11)
RDW: 13.2 % (ref 11.5–15.5)
WBC: 7.8 10*3/uL (ref 4.0–10.5)
nRBC: 0 % (ref 0.0–0.2)

## 2018-11-12 LAB — BASIC METABOLIC PANEL
Anion gap: 8 (ref 5–15)
BUN: 19 mg/dL (ref 8–23)
CO2: 26 mmol/L (ref 22–32)
Calcium: 9 mg/dL (ref 8.9–10.3)
Chloride: 107 mmol/L (ref 98–111)
Creatinine, Ser: 0.95 mg/dL (ref 0.44–1.00)
GFR calc Af Amer: >60 mL/min (ref >60)
GFR calc non Af Amer: 54 mL/min — ABNORMAL LOW (ref >60)
Glucose, Bld: 92 mg/dL (ref 70–99)
Potassium: 4.5 mmol/L (ref 3.5–5.1)
Sodium: 141 mmol/L (ref 135–145)

## 2018-11-12 MED ORDER — CEPHALEXIN 500 MG PO CAPS: 500.0000 mg | ORAL_CAPSULE | Freq: Four times a day (QID) | ORAL | 0 refills | Status: DC

## 2018-11-12 NOTE — ED Notes (Signed)
ED Provider at bedside. 

## 2018-11-12 NOTE — ED Provider Notes (Signed)
[facility]PART[facility] Provider Note   CSN: [id number] Arrival date & time: 11/12/18  1155     History   Chief Complaint Chief Complaint  Patient presents with   Leg Swelling    HPI Gabrielle [name] is a 85 y.o. female with a past medical history of breast cancer, COPD, stroke, tobacco abuse, who presents today for evaluation of redness and swelling to the right lower leg for 2 weeks.  She denies any fevers.  She has not been seen or evaluated for this so far.  Reportedly a family member appointments with medical knowledge was concerned about a DVT and sent her here for evaluation.  History is obtained from patient and son in the room.  She denies any fevers, no nausea vomiting or diarrhea.  No injury.     HPI  Past Medical History:  Diagnosis Date   Anxiety    Breast cancer of upper-outer quadrant of right female breast ([place]) 10/2013   Anastrazole since 12/2013--plan is to take this for 5 yrs.  No evidence of dz recurrence as of 11/2015 oncol f/u.   Cataract    COPD (chronic obstructive pulmonary disease) ([place])    Macular degeneration    Osteoporosis 10/2014   T-score -2.8   Pneumonia    hx of    Retina hole    Skin cancer    Stroke [facility])    Tobacco dependence     Patient Active Problem List   Diagnosis Date Noted   Vaccine counseling 11/29/2016   Colon cancer screening 08/06/2016   Vascular dementia ([place]) 08/01/2015   Osteoporosis 12/20/2014   COPD, moderate ([place]) 12/07/2013   Cachexia ([place]) 12/07/2013   Malignant neoplasm of upper-outer quadrant of right breast in female, estrogen receptor positive ([place]) 11/29/2013   Lung nodule, solitary 11/21/2013   Weight loss 10/05/2013   Cigarette smoker 10/05/2013    Past Surgical History:  Procedure Laterality Date   ABDOMINAL HYSTERECTOMY  1980   (ovaries intact)   APPENDECTOMY     BREAST BIOPSY Right    CATARACT EXTRACTION Bilateral    MASTECTOMY Right    MOHS  SURGERY     Dr. Sarajane Jews   PARS PLANA VITRECTOMY W/ REPAIR OF MACULAR HOLE Right    RETINAL LASER PROCEDURE     TOTAL MASTECTOMY Right 12/18/2013   Procedure: RIGHT TOTAL MASTECTOMY;  Surgeon: Excell Seltzer, MD;  Location: [facility];  Service: General;  Laterality: Right;     OB History   No obstetric history on file.      Home Medications    Prior to Admission medications   Medication Sig Start Date End Date Taking? Authorizing Provider  anastrozole (ARIMIDEX) 1 MG tablet TAKE 1 TABLET IN THE MORNING. 10/30/18  Yes Magrinat, Virgie Dad, MD  Cholecalciferol 2000 units CAPS Take 1 capsule (2,000 Units total) by mouth daily. 12/02/17  Yes Kuneff, Renee A, DO  cyanocobalamin 1000 MCG tablet Take 1,000 mcg by mouth daily.   Yes [provider]  donepezil (ARICEPT) 10 MG tablet TAKE 1 TABLET BY MOUTH DAILY. 10/30/18  Yes Patel, Donika K, DO  mirtazapine (REMERON) 7.5 MG tablet Take 1 tablet (7.5 mg total) by mouth at bedtime. 06/10/17  Yes Kuneff, Renee A, DO  Multiple Vitamins-Minerals (PRESERVISION AREDS 2 PO) Take 1 tablet by mouth 2 (two) times daily.   Yes [provider]  QC LO-DOSE ASPIRIN 81 MG EC tablet TAKE 1 TABLET IN THE MORNING. 11/28/17  Yes Narda Amber  K, DO  SPIRIVA RESPIMAT 2.5 MCG/ACT AERS INHALE 2 PUFFS INTO THE LUNGS DAILY. 03/28/18  Yes Brand Males, MD  cephALEXin (KEFLEX) 500 MG capsule Take 1 capsule (500 mg total) by mouth 4 (four) times daily for 10 days. 11/12/18 11/22/18  Lorin Glass, PA-C    Family History Family History  Problem Relation Age of Onset   Heart attack Mother        Deceased   Cancer Mother        fallopian tube cancer in 71s; deceased 51   Throat cancer Father        Deceased 69; smoker   Diabetes Brother    Prostate cancer Brother 28       Currently 41   Other Brother        Deceased, hemorhhage of pancreas   Leukemia Maternal Aunt    Breast cancer Paternal Aunt        age at diagnosis unknown    Throat cancer Paternal Uncle        Deceased 62s; smoker   Leukemia Paternal Grandmother     Social History Social History   Tobacco Use   Smoking status: Current Every Day Smoker    Packs/day: 0.50    Years: 60.00    Pack years: 30.00    Types: Cigarettes   Smokeless tobacco: Never Used   Tobacco comment: 1/2 ppd 4.4.2019  Substance Use Topics   Alcohol use: Yes    Alcohol/week: 2.0 standard drinks    Types: 2 Glasses of wine per week    Comment: occasional   Drug use: No     Allergies   Patient has no known allergies.   Review of Systems Review of Systems  Constitutional: Negative for chills and fever.  HENT: Negative for congestion.   Respiratory: Negative for chest tightness and shortness of breath.   Gastrointestinal: Negative for abdominal pain and nausea.  Musculoskeletal: Negative for back pain and neck pain.  Skin: Positive for color change.  Neurological: Negative for weakness and headaches.  All other systems reviewed and are negative.    Physical Exam Updated Vital Signs BP (!) 141/64 (BP Location: Right Arm)    Pulse 67    Temp 98.1 F (36.7 C) (Oral)    Resp 16    Wt 51.7 kg    SpO2 100%    BMI 19.57 kg/m   Physical Exam Vitals signs and nursing note reviewed.  Constitutional:      General: She is not in acute distress.    Appearance: She is well-developed. She is not diaphoretic.  HENT:     Head: Normocephalic and atraumatic.  Eyes:     General: No scleral icterus.       Right eye: No discharge.        Left eye: No discharge.     Conjunctiva/sclera: Conjunctivae normal.  Neck:     Musculoskeletal: Normal range of motion.  Cardiovascular:     Rate and Rhythm: Normal rate and regular rhythm.     Pulses:          Dorsalis pedis pulses are 1+ on the right side and 1+ on the left side.  Pulmonary:     Effort: Pulmonary effort is normal. No respiratory distress.     Breath sounds: No stridor.  Abdominal:     General: There is no  distension.  Musculoskeletal:        General: No deformity.     Comments: There is  mild tenderness to palpation over the right calf, lower leg.  She is able to dorsiflex and plantarflex without significant difficulties.  Skin:    General: Skin is warm and dry.     Comments: There are multiple small sub cm scattered scabs with out drainage, localized fluctuance or induration.  Skin of the right lower leg is slightly red without localized fluctuance.  There is mild generalized edema, +1 pitting bilaterally.  Neurological:     Mental Status: She is alert.     Motor: No abnormal muscle tone.  Psychiatric:        Behavior: Behavior normal.            ED Treatments / Results  Labs (all labs ordered are listed, but only abnormal results are displayed) Labs Reviewed  BASIC METABOLIC PANEL - Abnormal; Notable for the following components:      Result Value   GFR calc non Af Amer 54 (*)    All other components within normal limits  CBC WITH DIFFERENTIAL/PLATELET    EKG None  Radiology Dg Tibia/fibula Right  Result Date: 11/12/2018 CLINICAL DATA:  Acute RIGHT LOWER leg pain for 2 weeks. EXAM: RIGHT TIBIA AND FIBULA - 2 VIEW COMPARISON:  None. FINDINGS: No acute fracture, subluxation or dislocation. No bony abnormalities are noted. Mild diffuse soft tissue swelling is noted. No radiopaque foreign bodies are present. IMPRESSION: Mild diffuse soft tissue swelling without bony abnormality. Electronically Signed   By: Margarette Canada M.D.   On: 11/12/2018 14:53   US Venous Img Lower Right (dvt Study)  Result Date: 11/12/2018 CLINICAL DATA:  85 year old female with RIGHT LOWER extremity pain and swelling for 2 weeks. EXAM: RIGHT LOWER EXTREMITY VENOUS DOPPLER ULTRASOUND TECHNIQUE: Gray-scale sonography with graded compression, as well as color Doppler and duplex ultrasound were performed to evaluate the lower extremity deep venous systems from the level of the common femoral vein and including  the common femoral, femoral, profunda femoral, popliteal and calf veins including the posterior tibial, peroneal and gastrocnemius veins when visible. The superficial great saphenous vein was also interrogated. Spectral Doppler was utilized to evaluate flow at rest and with distal augmentation maneuvers in the common femoral, femoral and popliteal veins. COMPARISON:  None. FINDINGS: LEFT common Femoral Vein: Respiratory phasicity is normal and symmetric with the symptomatic side. No evidence of thrombus. Normal compressibility. Normal flow, compressibility, and augmentation within the RIGHT distal common femoral, proximal profunda femoral, proximal greater saphenous, entire femoral, popliteal veins, and imaged calf veins. IMPRESSION: No evidence of RIGHT LOWER extremity deep venous thrombosis. Electronically Signed   By: Margarette Canada M.D.   On: 11/12/2018 14:52    Procedures Procedures (including critical care time)  Medications Ordered in ED Medications - No data to display   Initial Impression / Assessment and Plan / ED Course  I have reviewed the triage vital signs and the nursing notes.  Pertinent labs & imaging results that were available during my care of the patient were reviewed by me and considered in my medical decision making (see chart for details).  Clinical Course as of Nov 12 1715  Sun Nov 11, 9245  9811 85 year old female complaining of some right lower leg pain redness and feeling hot.  She denies any fevers.  She has some mild erythema through her lower leg and is diffusely tender.  She has some cracked and dried skin but no seen obvious wound.  Getting ultrasound of her lower extremity along with an x-ray.   [MB]  Clinical Course User Index [MB] Hayden Rasmussen, MD      Patient presents today for evaluation of right lower leg pain and redness.  This is been going on for 2 weeks however today it was recommended to her that she get an ultrasound to evaluate for DVT.  She  reports no fevers and generally otherwise feels well.  DVT study was obtained without evidence of DVT or other abnormality.  X-ray showed mild diffuse soft tissue swelling without bony abnormality.  After discussions with the patient and her family member in the room labs were obtained which showed a GFR of 54, however she is without other significant hematologic or electrolyte derangements.  She does not meet Sirs or sepsis criteria, is afebrile, not tachycardic or tachypneic without leukocytosis.  I suspect that there may be a degree of venous stasis dermatitis contributing to her symptoms however she does have a few superficial scabs scattered about her lower extremities therefore will trial Keflex.  Recommended PCP follow-up in 2 days for repeat evaluation and wound check or sooner if symptoms worsen or have concerns.  Return precautions were discussed with patient who states their understanding.  At the time of discharge patient denied any unaddressed complaints or concerns.  Patient is agreeable for discharge home.    Final Clinical Impressions(s) / ED Diagnoses   Final diagnoses:  Redness and swelling of lower leg    ED Discharge Orders         Ordered    cephALEXin (KEFLEX) 500 MG capsule  4 times daily     11/12/18 1708           Lorin Glass, [place] 11/12/18 1717    Hayden Rasmussen, MD 11/14/18 [id number]

## 2018-11-12 NOTE — ED Triage Notes (Signed)
Pt has had swelling and redness of the right lower leg x 2 weeks. Denies fever. Area is hot to touch and sore.

## 2018-11-12 NOTE — Discharge Instructions (Addendum)
You may have diarrhea from the antibiotics.  It is very important that you continue to take the antibiotics even if you get diarrhea unless a medical professional tells you that you may stop taking them.  If you stop too early the bacteria you are being treated for will become stronger and you may need different, more powerful antibiotics that have more side effects and worsening diarrhea.  Please stay well hydrated and consider probiotics as they may decrease the severity of your diarrhea.   °

## 2018-11-15 ENCOUNTER — Ambulatory Visit (INDEPENDENT_AMBULATORY_CARE_PROVIDER_SITE_OTHER): Payer: Medicare HMO | Admitting: Family Medicine

## 2018-11-15 ENCOUNTER — Encounter: Payer: Self-pay | Admitting: Family Medicine

## 2018-11-15 ENCOUNTER — Other Ambulatory Visit: Payer: Self-pay

## 2018-11-15 VITALS — Ht 64.0 in

## 2018-11-15 DIAGNOSIS — L0390 Cellulitis, unspecified: Secondary | ICD-10-CM | POA: Diagnosis not present

## 2018-11-15 MED ORDER — DOXYCYCLINE HYCLATE 100 MG PO TABS: 100.0000 mg | ORAL_TABLET | Freq: Two times a day (BID) | ORAL | 0 refills | Status: DC

## 2018-11-15 MED ORDER — MUPIROCIN 2 % EX OINT: 1.0000 "application " | TOPICAL_OINTMENT | Freq: Two times a day (BID) | CUTANEOUS | 0 refills | Status: DC

## 2018-11-15 NOTE — Progress Notes (Signed)
VIRTUAL VISIT VIA VIDEO  I connected with [name] on 11/15/18 at  2:30 PM EDT by a video enabled telemedicine application and verified that I am speaking with the correct person using two identifiers. Location patient: Home Location provider: [facility], Office Persons participating in the virtual visit: Patient, [name] and R.Baker, LPN  I discussed the limitations of evaluation and management by telemedicine and the availability of in person appointments. The patient expressed understanding and agreed to proceed.   SUBJECTIVE Chief Complaint  Patient presents with  . Follow-up    Swelling in leg is better but redness has not. ED drew with marker on patients leg and redness has gone past those lines and to back of the cald. Started abx on Monday     HPI: Gabrielle [name] is a 85 y.o. female via telephone visit with conference call with her daughter today.  Patient was seen in the emergency room secondary to swelling and redness in her lower extremity.  She had a rule out of a DVT by venous Doppler studies.  She was started on Keflex for presumed cellulitis.  She states she has been taking the antibiotics as prescribed.  She reports the ED doctor outlined the redness and today she has noticed the redness has exceeded the line.  She denies fever, chills, nausea or vomit.  She states it is not painful.  She denies any injury or open wound.  ROS: See pertinent positives and negatives per HPI.  Patient Active Problem List   Diagnosis Date Noted  . Vaccine counseling 11/29/2016  . Colon cancer screening 08/06/2016  . Vascular dementia ([place]) 08/01/2015  . Osteoporosis 12/20/2014  . COPD, moderate ([place]) 12/07/2013  . Cachexia ([place]) 12/07/2013  . Malignant neoplasm of upper-outer quadrant of right breast in female, estrogen receptor positive ([place]) 11/29/2013  . Lung nodule, solitary 11/21/2013  . Weight loss 10/05/2013  . Cigarette smoker 10/05/2013    Social History    Tobacco Use  . Smoking status: Current Every Day Smoker    Packs/day: 0.50    Years: 60.00    Pack years: 30.00    Types: Cigarettes  . Smokeless tobacco: Never Used  . Tobacco comment: 1/2 ppd 4.4.2019  Substance Use Topics  . Alcohol use: Yes    Alcohol/week: 2.0 standard drinks    Types: 2 Glasses of wine per week    Comment: occasional    Current Outpatient Medications:  .  anastrozole (ARIMIDEX) 1 MG tablet, TAKE 1 TABLET IN THE MORNING., Disp: 28 tablet, Rfl: 0 .  cephALEXin (KEFLEX) 500 MG capsule, Take 1 capsule (500 mg total) by mouth 4 (four) times daily for 10 days., Disp: 40 capsule, Rfl: 0 .  Cholecalciferol 2000 units CAPS, Take 1 capsule (2,000 Units total) by mouth daily., Disp: 90 each, Rfl: 1 .  cyanocobalamin 1000 MCG tablet, Take 1,000 mcg by mouth daily., Disp: , Rfl:  .  donepezil (ARICEPT) 10 MG tablet, TAKE 1 TABLET BY MOUTH DAILY., Disp: 30 tablet, Rfl: 0 .  Multiple Vitamins-Minerals (PRESERVISION AREDS 2 PO), Take 1 tablet by mouth 2 (two) times daily., Disp: , Rfl:  .  QC LO-DOSE ASPIRIN 81 MG EC tablet, TAKE 1 TABLET IN THE MORNING., Disp: 30 tablet, Rfl: 5 .  mirtazapine (REMERON) 7.5 MG tablet, Take 1 tablet (7.5 mg total) by mouth at bedtime. (Patient not taking: Reported on 11/15/2018), Disp: 90 tablet, Rfl: 1 .  SPIRIVA RESPIMAT 2.5 MCG/ACT AERS, INHALE 2  PUFFS INTO THE LUNGS DAILY. (Patient not taking: Reported on 11/15/2018), Disp: 4 g, Rfl: 0  No Known Allergies  OBJECTIVE: Ht 5\' 4"  (1.626 m)   BMI 19.57 kg/m  Gen: No acute distress. Nontoxic in appearance.  Chest: Cough or shortness of breath not present Skin: Reviewed ED note which had pictures of patient's lower extremity. Neuro:  Alert. Oriented x3  Psych: Normal affect, dress and demeanor. Normal speech. Normal thought content and judgment.  ASSESSMENT AND PLAN: Gabrielle [name] is a 85 y.o. female present for  Cellulitis, unspecified cellulitis site Uncertain etiology of patient's  symptoms.  Difficult to perform proper exam by telephone.  By her description it is exceeding the outlines that were marked when she left the emergency room, which would suggest antibiotics are not as effective.  Patient reports compliance with antibiotics. -Bactroban ointment prescribed, patient to apply twice daily.  Encouraged her to soak and Epson salt soaks and warm water.  She then is to apply Bactroban ointment and wrap lightly to give light compression with an Ace bandage.  She reports understanding of these instructions. -Start doxycycline twice daily x10 days. -Close follow-up on Monday, patient was instructed if she is worsening with fevers or chills, or the redness exceeds line I have asked her to draw on current location of redness, she is to again be seen in the emergency room.   > 25 minutes was spent with patient.    Howard Pouch, DO 11/15/2018

## 2018-11-16 ENCOUNTER — Encounter: Payer: Self-pay | Admitting: Family Medicine

## 2018-11-17 ENCOUNTER — Telehealth: Payer: Self-pay | Admitting: Family Medicine

## 2018-11-17 ENCOUNTER — Ambulatory Visit: Payer: Self-pay | Admitting: *Deleted

## 2018-11-17 NOTE — Telephone Encounter (Signed)
Sent to [facility] clinical staff

## 2018-11-17 NOTE — Telephone Encounter (Signed)
Daughter [name] called in regarding patient. Requesting to speak to a triage nurse. She reports patient is not any better, even after [name] changed her meds on Wednesday.  Daughter states that her legs are still swollen. She was trying to get daughter seen before the weekend.   Call disconnected  Forwarding to rebecca to call patient

## 2018-11-17 NOTE — Telephone Encounter (Signed)
Patient's daughter, [name] called stating the cellulitis on her mother's right leg is not improving.  She is not with her mother currently, but spoke with her on the phone and her mother states that the redness is increasing and swelling is worse.  As patient's daughter is not there, she cannot give an exact description of wound.  She does not know if her mother is running a fever.  She has been taking doxycycline since prescribed for her by [name] at visit on 11/14/2018.  Called and spoke with Hinton Dyer at Dr. Lucita Lora office who states she has already spoken with patient's daughter and has asked Dr. Lucita Lora nurse Eugene Garnet to call patient's daughter back.  Roena Malady that Dr. Lucita Lora office note states that if her mother's cellulitis worsened that she was to return to ER, advised her that this is the best option since her mother feels she has gotten worse.  She states it would be very difficult for her mother to get to the ER, and would like to speak with someone at Dr. Lucita Lora office first.  Advised her that Dr. Lucita Lora nurse, Eugene Garnet would be calling with any further recommendations.

## 2018-11-17 NOTE — Telephone Encounter (Signed)
Please see previous phone note- Daughter was advised to take mother to ED and if she was unable to transport her the EMS could be called

## 2018-11-17 NOTE — Telephone Encounter (Signed)
Daughter was called and VM was left telling patients daughter to return call

## 2018-11-17 NOTE — Telephone Encounter (Signed)
Pts daughter returned call and said mother called her this afternoon and said that her leg was more swollen, is worse today, and painful. Daughter was advised to take her to the ED. Daughter became verbally upset and said she did not understand why she could not come to the office. It was explained to daughter that if patient has been on two abx, leg is swollen, redness has spread around/up leg, with warmth that this is an emergency and she would be better taken care of a the emergency room. Daughter stated "she is just expected to drop everything and drive from [place] to take her mom to the ED when she can drive herself to the doctors office". Daughter was explained this was an emergency situation and she could call EMS to transport her.

## 2018-11-20 ENCOUNTER — Other Ambulatory Visit: Payer: Self-pay

## 2018-11-20 ENCOUNTER — Ambulatory Visit (HOSPITAL_BASED_OUTPATIENT_CLINIC_OR_DEPARTMENT_OTHER)
Admission: RE | Admit: 2018-11-20 | Discharge: 2018-11-20 | Disposition: A | Discharge status: Home / Self Care | Payer: Medicare HMO | Source: Ambulatory Visit | Attending: Family Medicine | Admitting: Family Medicine

## 2018-11-20 ENCOUNTER — Ambulatory Visit (INDEPENDENT_AMBULATORY_CARE_PROVIDER_SITE_OTHER): Payer: Medicare HMO | Admitting: Family Medicine

## 2018-11-20 ENCOUNTER — Encounter: Payer: Self-pay | Admitting: Family Medicine

## 2018-11-20 VITALS — BP 119/74 | HR 88 | Temp 97.8°F | Resp 18 | Ht 64.0 in | Wt 122.2 lb

## 2018-11-20 DIAGNOSIS — L0390 Cellulitis, unspecified: Secondary | ICD-10-CM

## 2018-11-20 DIAGNOSIS — M79604 Pain in right leg: Secondary | ICD-10-CM | POA: Diagnosis not present

## 2018-11-20 DIAGNOSIS — M7989 Other specified soft tissue disorders: Secondary | ICD-10-CM

## 2018-11-20 DIAGNOSIS — M79671 Pain in right foot: Secondary | ICD-10-CM | POA: Diagnosis not present

## 2018-11-20 DIAGNOSIS — I998 Other disorder of circulatory system: Secondary | ICD-10-CM | POA: Diagnosis not present

## 2018-11-20 DIAGNOSIS — L03115 Cellulitis of right lower limb: Secondary | ICD-10-CM | POA: Diagnosis not present

## 2018-11-20 NOTE — Patient Instructions (Signed)
Continue medications as ordered.  We will set up doppler studies of your leg- they will call you.   Please have xray completed today.  We will likely send you to wound care center-- will wait on results of xray and labs first.

## 2018-11-20 NOTE — Progress Notes (Signed)
SUBJECTIVE Chief Complaint  Patient presents with  . Leg Swelling     Redness and swelling in right calf. 8 pills left of abx. Pt states leg is painful when first waking up in the AM.    HPI: Gabrielle [name] is a 85 y.o. female for followup on lower ext pain and swelling. She reports redness has decreased back to where it was marked in the ED after changing abx. She denies fever, chills or open wounds. Leg is painful to touch and at rest. Better once she starts moving. She denies injury to this location. She had a skin cancer removed a long time ago and wonders if that is healed or causing the infection because it is tender. She has greater than 60 pack year h/o smoking and continues to smoke daily. She lives alone. Her son is with her today and is wondering if they need to try again to start looking into placement of his mother. An attempt at starting this process was initiated in 2017 with this provider. Social work was referred to them, however because of the household condition and clutter- they were not able to perform evaluation and make recommendations. Eventually family was about to place her and FL2 was completed along with TB test 09/20/2016.  Patient was resistant to losing autonomy.   Prior note:  via telephone visit with conference call with her daughter today.  Patient was seen in the emergency room secondary to swelling and redness in her lower extremity.  She had a rule out of a DVT by venous Doppler studies.  She was started on Keflex for presumed cellulitis.  She states she has been taking the antibiotics as prescribed.  She reports the ED doctor outlined the redness and today she has noticed the redness has exceeded the line.  She denies fever, chills, nausea or vomit.  She states it is not painful.  She denies any injury or open wound.  ROS: See pertinent positives and negatives per HPI.  Patient Active Problem List   Diagnosis Date Noted  . Vaccine counseling 11/29/2016   . Colon cancer screening 08/06/2016  . Vascular dementia ([place]) 08/01/2015  . Osteoporosis 12/20/2014  . COPD, moderate ([place]) 12/07/2013  . Cachexia ([place]) 12/07/2013  . Malignant neoplasm of upper-outer quadrant of right breast in female, estrogen receptor positive ([place]) 11/29/2013  . Lung nodule, solitary 11/21/2013  . Weight loss 10/05/2013  . Cigarette smoker 10/05/2013    Social History   Tobacco Use  . Smoking status: Current Every Day Smoker    Packs/day: 0.50    Years: 60.00    Pack years: 30.00    Types: Cigarettes  . Smokeless tobacco: Never Used  . Tobacco comment: 1/2 ppd 4.4.2019  Substance Use Topics  . Alcohol use: Yes    Alcohol/week: 2.0 standard drinks    Types: 2 Glasses of wine per week    Comment: occasional    Current Outpatient Medications:  .  anastrozole (ARIMIDEX) 1 MG tablet, TAKE 1 TABLET IN THE MORNING., Disp: 28 tablet, Rfl: 0 .  Cholecalciferol 2000 units CAPS, Take 1 capsule (2,000 Units total) by mouth daily., Disp: 90 each, Rfl: 1 .  cyanocobalamin 1000 MCG tablet, Take 1,000 mcg by mouth daily., Disp: , Rfl:  .  donepezil (ARICEPT) 10 MG tablet, TAKE 1 TABLET BY MOUTH DAILY., Disp: 30 tablet, Rfl: 0 .  doxycycline (VIBRA-TABS) 100 MG tablet, Take 1 tablet (100 mg total) by mouth 2 (two)  times daily., Disp: 20 tablet, Rfl: 0 .  mirtazapine (REMERON) 7.5 MG tablet, Take 1 tablet (7.5 mg total) by mouth at bedtime., Disp: 90 tablet, Rfl: 1 .  Multiple Vitamins-Minerals (PRESERVISION AREDS 2 PO), Take 1 tablet by mouth 2 (two) times daily., Disp: , Rfl:  .  mupirocin ointment (BACTROBAN) 2 %, Place 1 application into the nose 2 (two) times daily., Disp: 22 g, Rfl: 0 .  QC LO-DOSE ASPIRIN 81 MG EC tablet, TAKE 1 TABLET IN THE MORNING., Disp: 30 tablet, Rfl: 5 .  SPIRIVA RESPIMAT 2.5 MCG/ACT AERS, INHALE 2 PUFFS INTO THE LUNGS DAILY., Disp: 4 g, Rfl: 0 .  cephALEXin (KEFLEX) 500 MG capsule, Take 1 capsule (500 mg total) by mouth 4 (four) times daily  for 10 days., Disp: 40 capsule, Rfl: 0  No Known Allergies  OBJECTIVE: BP 119/74 (BP Location: Left Arm, Patient Position: Sitting, Cuff Size: Normal)   Pulse 88   Temp 97.8 F (36.6 C) (Temporal)   Resp 18   Ht 5\' 4"  (1.626 m)   Wt 122 lb 4 oz (55.5 kg)   SpO2 98%   BMI 20.98 kg/m  Gen: Afebrile. No acute distress.  HENT: AT. [place].  Eyes:Pupils Equal Round Reactive to light, Extraocular movements intact,  Conjunctiva without redness, discharge or icterus. CV: RRR  MSK: erythema, now back within the lines drawn at ED. Swelling, pitting edema. TTP. Dry scaly skin of feet with elongated nails. ?chronic  Ulcer formation posterior heel. Multiple excoriations/scratches below knee to foot, no drainage or open wound. + 1 edema. .  Neuro:  Normal gait. PERLA. EOMi. Alert. Oriented x3  Psych: Normal affect, dress and demeanor. Normal speech. Normal thought content and judgment.   ASSESSMENT AND PLAN: Gabrielle [name] is a 85 y.o. female present for  Right lower ext pain and swelling: - the redness has improved and returned back to the original location marked by ED physician.  - there are certainly signs of venous stasis dermatitis and  of chronic ischemic changes. She has been a long time multipack a day smoker. I believe she should undergo vascular studies and will also refer to wound care in hopes to prevent ulceration and skin breakdown of right lower ext. - CBC - foot/heel xray to r/o osteomyelitis - posterior heel has an area that is concerning for chronic ulcer/skin breakdown.  -Bactroban ointment prescribed >> continue . Will consider changing ointment to steroid of follow up  - continue  to soak and Epson salt soaks and warm water daily.   - keep leg elevated whenever possible and at least 30 minutes TID.  - finish doxycyline course. Do not strongly feel this is cellulitic, but will continue course.  -Close follow-up 1 week., sooner if worsening.    > 25 minutes spent with patient,  >50% of time spent face to face   Orders Placed This Encounter  Procedures  . DG Os Calcis Right  . DG Foot Complete Right  . CBC w/Diff  . AMB referral to wound care center  . VAS [place] LE ART SEG MULTI (Segm&LE Reynauds)       Howard Pouch, DO 11/20/2018

## 2018-11-21 ENCOUNTER — Encounter: Payer: Self-pay | Admitting: Family Medicine

## 2018-11-21 ENCOUNTER — Telehealth: Payer: Self-pay | Admitting: Family Medicine

## 2018-11-21 LAB — CBC WITH DIFFERENTIAL/PLATELET
Basophils Absolute: 0.1 10*3/uL (ref 0.0–0.1)
Basophils Relative: 0.9 % (ref 0.0–3.0)
Eosinophils Absolute: 0.1 10*3/uL (ref 0.0–0.7)
Eosinophils Relative: 1.1 % (ref 0.0–5.0)
HCT: 42.5 % (ref 36.0–46.0)
Hemoglobin: 14 g/dL (ref 12.0–15.0)
Lymphocytes Relative: 20.5 % (ref 12.0–46.0)
Lymphs Abs: 1.6 10*3/uL (ref 0.7–4.0)
MCHC: 32.8 g/dL (ref 30.0–36.0)
MCV: 98.5 fl (ref 78.0–100.0)
Monocytes Absolute: 0.7 10*3/uL (ref 0.1–1.0)
Monocytes Relative: 8.7 % (ref 3.0–12.0)
Neutro Abs: 5.5 10*3/uL (ref 1.4–7.7)
Neutrophils Relative %: 68.8 % (ref 43.0–77.0)
Platelets: 284 10*3/uL (ref 150.0–400.0)
RBC: 4.32 Mil/uL (ref 3.87–5.11)
RDW: 13.5 % (ref 11.5–15.5)
WBC: 8 10*3/uL (ref 4.0–10.5)

## 2018-11-21 NOTE — Telephone Encounter (Signed)
Her xrays are normal- they do not show evidence of infection of her bone. Her blood work is also normal and unchanged from ED> suggesting at least not worsening condition and less likely an infection. I believe her pain and swelling is possibly caused by decreased circulation to and from the leg.  Finish the abx we provided.  Continue to soak leg in epson salt soaks at least 20 minutes a day.  Continue the ointment prescribed. Keep leg elevated, on pillows above heart if able at least 3 times a day for 30 minutes and if sitting- keep leg elevated whenever possible.   I have referred to wound care and they should be calling to schedule.  I also am scheduling additional blood flow studies of her leg. They should call to schedule this as well.   I would like to see her back in 7-10 days for recheck, sooner if worsening.

## 2018-11-21 NOTE — Telephone Encounter (Signed)
Pts daughter was called and given results. Appt was scheduled for follow up.

## 2018-11-23 ENCOUNTER — Telehealth: Payer: Self-pay | Admitting: Family Medicine

## 2018-11-23 DIAGNOSIS — M79604 Pain in right leg: Secondary | ICD-10-CM

## 2018-11-23 DIAGNOSIS — I998 Other disorder of circulatory system: Secondary | ICD-10-CM

## 2018-11-23 DIAGNOSIS — F17210 Nicotine dependence, cigarettes, uncomplicated: Secondary | ICD-10-CM

## 2018-11-23 NOTE — Telephone Encounter (Signed)
Orders changed. 

## 2018-11-23 NOTE — Telephone Encounter (Signed)
Please enter order for [id number] and [id number] to [facility]. Thank you.

## 2018-11-24 ENCOUNTER — Other Ambulatory Visit: Payer: Self-pay

## 2018-11-24 ENCOUNTER — Ambulatory Visit (HOSPITAL_BASED_OUTPATIENT_CLINIC_OR_DEPARTMENT_OTHER)
Admission: RE | Admit: 2018-11-24 | Discharge: 2018-11-24 | Disposition: A | Discharge status: Home / Self Care | Payer: Medicare HMO | Source: Ambulatory Visit | Attending: Family Medicine | Admitting: Family Medicine

## 2018-11-24 ENCOUNTER — Ambulatory Visit (HOSPITAL_COMMUNITY)
Admission: RE | Admit: 2018-11-24 | Discharge: 2018-11-24 | Disposition: A | Discharge status: Home / Self Care | Payer: Medicare HMO | Source: Ambulatory Visit | Attending: Family Medicine | Admitting: Family Medicine

## 2018-11-24 DIAGNOSIS — M7989 Other specified soft tissue disorders: Secondary | ICD-10-CM

## 2018-11-24 DIAGNOSIS — M79604 Pain in right leg: Secondary | ICD-10-CM | POA: Insufficient documentation

## 2018-11-24 DIAGNOSIS — M79606 Pain in leg, unspecified: Secondary | ICD-10-CM

## 2018-11-24 DIAGNOSIS — F17210 Nicotine dependence, cigarettes, uncomplicated: Secondary | ICD-10-CM

## 2018-11-24 DIAGNOSIS — I998 Other disorder of circulatory system: Secondary | ICD-10-CM

## 2018-11-24 NOTE — Progress Notes (Signed)
Lower extremity arterial and ABI has been completed.   Preliminary results in CV Proc.   Abram Sander 11/24/2018 3:38 PM

## 2018-11-26 ENCOUNTER — Encounter: Payer: Self-pay | Admitting: Family Medicine

## 2018-11-26 DIAGNOSIS — I739 Peripheral vascular disease, unspecified: Secondary | ICD-10-CM | POA: Insufficient documentation

## 2018-11-26 DIAGNOSIS — I70201 Unspecified atherosclerosis of native arteries of extremities, right leg: Secondary | ICD-10-CM | POA: Insufficient documentation

## 2018-11-27 ENCOUNTER — Other Ambulatory Visit: Payer: Self-pay | Admitting: Oncology

## 2018-11-29 ENCOUNTER — Other Ambulatory Visit: Payer: Self-pay

## 2018-11-29 ENCOUNTER — Encounter: Payer: Medicare HMO | Admitting: Internal Medicine

## 2018-11-29 ENCOUNTER — Telehealth: Payer: Self-pay | Admitting: Oncology

## 2018-11-29 NOTE — Telephone Encounter (Signed)
GM CME 10/20 per GM moved f/u to [facility] as phone visit. Confirmed with patient son. Per son on day of visit call patient at home number. Home number listed in EPIC and added to appointment note,

## 2018-12-01 ENCOUNTER — Other Ambulatory Visit: Payer: Self-pay

## 2018-12-01 ENCOUNTER — Ambulatory Visit (INDEPENDENT_AMBULATORY_CARE_PROVIDER_SITE_OTHER): Payer: Medicare HMO | Admitting: Family Medicine

## 2018-12-01 ENCOUNTER — Encounter: Payer: Self-pay | Admitting: Family Medicine

## 2018-12-01 ENCOUNTER — Encounter (HOSPITAL_BASED_OUTPATIENT_CLINIC_OR_DEPARTMENT_OTHER): Payer: Medicare HMO

## 2018-12-01 VITALS — BP 115/74 | HR 91 | Temp 98.3°F | Resp 16 | Ht 64.0 in | Wt 120.2 lb

## 2018-12-01 DIAGNOSIS — M79604 Pain in right leg: Secondary | ICD-10-CM | POA: Diagnosis not present

## 2018-12-01 DIAGNOSIS — I739 Peripheral vascular disease, unspecified: Secondary | ICD-10-CM | POA: Diagnosis not present

## 2018-12-01 DIAGNOSIS — Z23 Encounter for immunization: Secondary | ICD-10-CM

## 2018-12-01 DIAGNOSIS — I70201 Unspecified atherosclerosis of native arteries of extremities, right leg: Secondary | ICD-10-CM | POA: Diagnosis not present

## 2018-12-01 DIAGNOSIS — M7989 Other specified soft tissue disorders: Secondary | ICD-10-CM | POA: Diagnosis not present

## 2018-12-01 MED ORDER — TRIAMCINOLONE ACETONIDE 0.1 % EX CREA: 1.0000 "application " | TOPICAL_CREAM | Freq: Two times a day (BID) | CUTANEOUS | 2 refills | Status: DC

## 2018-12-01 NOTE — Progress Notes (Signed)
SUBJECTIVE Chief Complaint  Patient presents with   Follow-up    recheck wounds on legs. patient reports they have no gotten better    HPI: Gabrielle [name] is a 85 y.o. female for followup on lower ext pain and swelling.  Patient presents today with her son to discuss her vascular studies. Right SFA mid-distal occlusion present by arterial duplex.  ABI with moderate PAD bilateral lower extremities.  She endorses continued redness and swelling of her right lower extremity.  She describes the pain as an occasional sharp stabbing pain that is worse when she first wakes up in the morning, but present all day.  She describes the daily sensation of burning or stinging of her right lower extremity.  She has continued mild swelling and redness.  She also feels like her right lower extremity "falls asleep ".  Patient has been an everyday smoker for many years.  She is not interested in quitting.  She has been seen by wound clinic-without any further suggestions.  She has been ruled out for DVT in the emergency room.  Her labs do not suggest infectious causes.  She did have 2 rounds of antibiotics without improvement of lower extremity.  X-rays do not indicate osteomyelitis.  Prior note:  She reports redness has decreased back to where it was marked in the ED after changing abx. She denies fever, chills or open wounds. Leg is painful to touch and at rest. Better once she starts moving. She denies injury to this location. She had a skin cancer removed a long time ago and wonders if that is healed or causing the infection because it is tender. She has greater than 60 pack year h/o smoking and continues to smoke daily. She lives alone. Her son is with her today and is wondering if they need to try again to start looking into placement of his mother. An attempt at starting this process was initiated in 2017 with this provider. Social work was referred to them, however because of the household condition and  clutter- they were not able to perform evaluation and make recommendations. Eventually family was about to place her and FL2 was completed along with TB test 09/20/2016.  Patient was resistant to losing autonomy.   Prior note:  via telephone visit with conference call with her daughter today.  Patient was seen in the emergency room secondary to swelling and redness in her lower extremity.  She had a rule out of a DVT by venous Doppler studies.  She was started on Keflex for presumed cellulitis.  She states she has been taking the antibiotics as prescribed.  She reports the ED doctor outlined the redness and today she has noticed the redness has exceeded the line.  She denies fever, chills, nausea or vomit.  She states it is not painful.  She denies any injury or open wound.  ROS: See pertinent positives and negatives per HPI.  Patient Active Problem List   Diagnosis Date Noted   PAD (peripheral artery disease) ([place]) 11/26/2018   Femoral artery occlusion, right ([place]) 11/26/2018   Vaccine counseling 11/29/2016   Colon cancer screening 08/06/2016   Vascular dementia ([place]) 08/01/2015   Osteoporosis 12/20/2014   COPD, moderate ([place]) 12/07/2013   Cachexia ([place]) 12/07/2013   Malignant neoplasm of upper-outer quadrant of right breast in female, estrogen receptor positive ([place]) 11/29/2013   Lung nodule, solitary 11/21/2013   Weight loss 10/05/2013   Cigarette smoker 10/05/2013    Social  History   Tobacco Use   Smoking status: Current Every Day Smoker    Packs/day: 0.50    Years: 60.00    Pack years: 30.00    Types: Cigarettes   Smokeless tobacco: Never Used   Tobacco comment: 1/2 ppd 4.4.2019  Substance Use Topics   Alcohol use: Yes    Alcohol/week: 2.0 standard drinks    Types: 2 Glasses of wine per week    Comment: occasional    Current Outpatient Medications:    anastrozole (ARIMIDEX) 1 MG tablet, TAKE 1 TABLET IN THE MORNING., Disp: 28 tablet, Rfl: 0    Cholecalciferol 2000 units CAPS, Take 1 capsule (2,000 Units total) by mouth daily., Disp: 90 each, Rfl: 1   cyanocobalamin 1000 MCG tablet, Take 1,000 mcg by mouth daily., Disp: , Rfl:    donepezil (ARICEPT) 10 MG tablet, TAKE 1 TABLET BY MOUTH DAILY., Disp: 30 tablet, Rfl: 0   mirtazapine (REMERON) 7.5 MG tablet, Take 1 tablet (7.5 mg total) by mouth at bedtime., Disp: 90 tablet, Rfl: 1   Multiple Vitamins-Minerals (PRESERVISION AREDS 2 PO), Take 1 tablet by mouth 2 (two) times daily., Disp: , Rfl:    mupirocin ointment (BACTROBAN) 2 %, Place 1 application into the nose 2 (two) times daily., Disp: 22 g, Rfl: 0   QC LO-DOSE ASPIRIN 81 MG EC tablet, TAKE 1 TABLET IN THE MORNING., Disp: 30 tablet, Rfl: 5   SPIRIVA RESPIMAT 2.5 MCG/ACT AERS, INHALE 2 PUFFS INTO THE LUNGS DAILY., Disp: 4 g, Rfl: 0   triamcinolone cream (KENALOG) 0.1 %, Apply 1 application topically 2 (two) times daily., Disp: 80 g, Rfl: 2  No Known Allergies  OBJECTIVE: BP 115/74 (BP Location: Right Arm, Patient Position: Sitting, Cuff Size: Normal)    Pulse 91    Temp 98.3 F (36.8 C) (Temporal)    Resp 16    Ht 5\' 4"  (1.626 m)    Wt 120 lb 4 oz (54.5 kg)    SpO2 94%    BMI 20.64 kg/m  Gen: Afebrile. No acute distress.  HENT: AT. [place].  CV: RRR, no edema left lower extremity. Chest: CTAB, no wheeze or crackles Neuro:  Normal gait. PERLA. EOMi. Alert. Oriented.  Psych: Normal affect, dress and demeanor. Normal speech. Normal thought content and judgment.. ; MSK: Erythema still present.  Tender to palpation right lower extremity over erythema and ankle.  Mild swelling still present, although improved.  Elongated toenails.  Ischemic changes present in toes with mottled bluish appearance.    ASSESSMENT AND PLAN: Gabrielle [name] is a 85 y.o. female present for  Right lower ext pain and swelling/ Femoral artery occlusion, right (HCC)/PAD (peripheral artery disease) ([place]) -Patient has been tempting to keep her foot elevated  and some of the swelling has resolved.  Redness has remained.  Her vascular studies resulted with superior right femoral artery occlusion and moderate PAD bilateral lower extremities.  These were explained and discussed with her and her son today. - Ambulatory referral to Vascular Surgery -Swelling has improved of her lower extremity.  Redness remains.  Ischemic mottled skin changes of foot remain. - there are certainly signs of venous stasis dermatitis and  of chronic ischemic changes. She has been a long time multipack a day smoker. - CBC> within normal limits - Kenalog cream prescribed for use over erythemic areas. - foot/heel xray to r/o osteomyelitis - >> no osteomyelitis. -Strongly encouraged her to quit smoking. - continue  to soak and Epson salt soaks  and warm water daily.   - keep leg elevated whenever possible and at least 30 minutes TID.  -  Referral to vascular surgery placed today  Influenza vaccine administered - Flu Vaccine QUAD High Dose(Fluad)   > 25 minutes spent with patient, >50% of time spent face to face   Orders Placed This Encounter  Procedures   Flu Vaccine QUAD High Dose(Fluad)   Ambulatory referral to Vascular Surgery       [facility], DO 12/04/2018

## 2018-12-01 NOTE — Patient Instructions (Signed)

## 2018-12-04 ENCOUNTER — Encounter: Payer: Self-pay | Admitting: Family Medicine

## 2018-12-13 ENCOUNTER — Other Ambulatory Visit: Payer: Self-pay | Admitting: Oncology

## 2018-12-13 ENCOUNTER — Other Ambulatory Visit: Payer: Self-pay | Admitting: Neurology

## 2018-12-14 ENCOUNTER — Other Ambulatory Visit: Payer: Self-pay

## 2018-12-14 ENCOUNTER — Ambulatory Visit (INDEPENDENT_AMBULATORY_CARE_PROVIDER_SITE_OTHER): Payer: Medicare HMO | Admitting: Vascular Surgery

## 2018-12-14 ENCOUNTER — Encounter: Payer: Self-pay | Admitting: Vascular Surgery

## 2018-12-14 VITALS — BP 116/74 | HR 72 | Temp 97.4°F | Resp 18 | Ht 64.0 in | Wt 119.0 lb

## 2018-12-14 DIAGNOSIS — I739 Peripheral vascular disease, unspecified: Secondary | ICD-10-CM | POA: Diagnosis not present

## 2018-12-14 DIAGNOSIS — J449 Chronic obstructive pulmonary disease, unspecified: Secondary | ICD-10-CM | POA: Diagnosis not present

## 2018-12-14 NOTE — Progress Notes (Signed)
Referring Physician: Dr Raoul Pitch  Patient name: Gabrielle [name] MRN: [id number] DOB: [birth year] Sex: female  REASON FOR CONSULT: Right leg cellulitis  HPI: Gabrielle [name] is a 85 y.o. female, who has had a couple of episodes of right leg cellulitis recently.  She does not really describe claudication.  However, she ambulates minimally.  She really only shuffles from her bedroom to her kitchen at home.  She has no open wounds.  Other medical problems include prior stroke, COPD and tobacco abuse.  Greater than 3 minutes today spent regarding smoking cessation counseling.  Past Medical History:  Diagnosis Date  . Anxiety   . Breast cancer of upper-outer quadrant of right female breast ([place]) 10/2013   Anastrazole since 12/2013--plan is to take this for 5 yrs.  No evidence of dz recurrence as of 11/2015 oncol f/u.  Marland Kitchen Cataract   . COPD (chronic obstructive pulmonary disease) ([place])   . Macular degeneration   . Osteoporosis 10/2014   T-score -2.8  . Pneumonia    hx of   . Retina hole   . Skin cancer   . Stroke ([place])   . Tobacco dependence    Past Surgical History:  Procedure Laterality Date  . ABDOMINAL HYSTERECTOMY  1980   (ovaries intact)  . APPENDECTOMY    . BREAST BIOPSY Right   . CATARACT EXTRACTION Bilateral   . MASTECTOMY Right   . MOHS SURGERY     Dr. Sarajane Jews  . PARS PLANA VITRECTOMY W/ REPAIR OF MACULAR HOLE Right   . RETINAL LASER PROCEDURE    . TOTAL MASTECTOMY Right 12/18/2013   Procedure: RIGHT TOTAL MASTECTOMY;  Surgeon: Excell Seltzer, MD;  Location: [facility];  Service: General;  Laterality: Right;    Family History  Problem Relation Age of Onset  . Heart attack Mother        Deceased  . Cancer Mother        fallopian tube cancer in 37s; deceased 62  . Throat cancer Father        Deceased 25; smoker  . Diabetes Brother   . Prostate cancer Brother 54       Currently 39  . Other Brother        Deceased, hemorhhage of pancreas  . Leukemia Maternal Aunt    . Breast cancer Paternal Aunt        age at diagnosis unknown  . Throat cancer Paternal Uncle        Deceased 73s; smoker  . Leukemia Paternal Grandmother     SOCIAL HISTORY: Social History   Socioeconomic History  . Marital status: Divorced    Spouse name: Not on file  . Number of children: 2  . Years of education: Not on file  . Highest education level: Some college, no degree  Occupational History  . Occupation: tax Comptroller: RETIRED    Comment: [employer]  . Financial resource strain: Not on file  . Food insecurity    Worry: Not on file    Inability: Not on file  . Transportation needs    Medical: Not on file    Non-medical: Not on file  Tobacco Use  . Smoking status: Current Every Day Smoker    Packs/day: 0.50    Years: 60.00    Pack years: 30.00    Types: Cigarettes  . Smokeless tobacco: Never Used  . Tobacco comment: 1/2 ppd 4.4.2019  Substance and Sexual Activity  .  Alcohol use: Yes    Alcohol/week: 2.0 standard drinks    Types: 2 Glasses of wine per week    Comment: occasional  . Drug use: No  . Sexual activity: Never  Lifestyle  . Physical activity    Days per week: Not on file    Minutes per session: Not on file  . Stress: Not on file  Relationships  . Social Herbalist on phone: Not on file    Gets together: Not on file    Attends religious service: Not on file    Active member of club or organization: Not on file    Attends meetings of clubs or organizations: Not on file    Relationship status: Not on file  . Intimate partner violence    Fear of current or ex partner: Not on file    Emotionally abused: Not on file    Physically abused: Not on file    Forced sexual activity: Not on file  Other Topics Concern  . Not on file  Social History Narrative   [name] lives alone in a [place] home. She has two grown children.   She continues to work part-time for [employer].    She has a grown son-lives in [place] in [place]. Gabrielle [name] is her nephew.    No Known Allergies  Current Outpatient Medications  Medication Sig Dispense Refill  . anastrozole (ARIMIDEX) 1 MG tablet TAKE 1 TABLET IN THE MORNING. 30 tablet 2  . Cholecalciferol 2000 units CAPS Take 1 capsule (2,000 Units total) by mouth daily. 90 each 1  . cyanocobalamin 1000 MCG tablet Take 1,000 mcg by mouth daily.    Marland Kitchen donepezil (ARICEPT) 10 MG tablet TAKE 1 TABLET BY MOUTH DAILY. 30 tablet 0  . mirtazapine (REMERON) 7.5 MG tablet Take 1 tablet (7.5 mg total) by mouth at bedtime. 90 tablet 1  . Multiple Vitamins-Minerals (PRESERVISION AREDS 2 PO) Take 1 tablet by mouth 2 (two) times daily.    . QC LO-DOSE ASPIRIN 81 MG EC tablet TAKE 1 TABLET IN THE MORNING. 30 tablet 5  . SPIRIVA RESPIMAT 2.5 MCG/ACT AERS INHALE 2 PUFFS INTO THE LUNGS DAILY. 4 g 0  . triamcinolone cream (KENALOG) 0.1 % Apply 1 application topically 2 (two) times daily. 80 g 2  . mupirocin ointment (BACTROBAN) 2 % Place 1 application into the nose 2 (two) times daily. 22 g 0   No current facility-administered medications for this visit.     ROS:   General:  No weight loss, Fever, chills  HEENT: No recent headaches, no nasal bleeding, no visual changes, no sore throat  Neurologic: No dizziness, blackouts, seizures. No recent symptoms of stroke or mini- stroke. No recent episodes of slurred speech, or temporary blindness.  Cardiac: No recent episodes of chest pain/pressure, no shortness of breath at rest.  + shortness of breath with exertion.  Denies history of atrial fibrillation or irregular heartbeat  Vascular: No history of rest pain in feet.  No history of claudication.  No history of non-healing ulcer, No history of DVT   Pulmonary: No home oxygen, no productive cough, no hemoptysis,  No asthma or wheezing  Musculoskeletal:  [ ]  Arthritis, [ ]  Low back pain,  [ ]  Joint pain  Hematologic:No history of hypercoagulable state.  No history  of easy bleeding.  No history of anemia  Gastrointestinal: No hematochezia or melena,  No gastroesophageal reflux, no trouble swallowing  Urinary: [ ]   chronic Kidney disease, [ ]  on HD - [ ]  MWF or [ ]  TTHS, [ ]  Burning with urination, [ ]  Frequent urination, [ ]  Difficulty urinating;   Skin: No rashes  Psychological: No history of anxiety,  No history of depression   Physical Examination  Vitals:   12/14/18 1425  BP: 116/74  Pulse: 72  Resp: 18  Temp: (!) 97.4 F (36.3 C)  SpO2: 97%  Weight: 119 lb (54 kg)  Height: 5\' 4"  (1.626 m)    Body mass index is 20.43 kg/m.  General:  Alert and oriented, no acute distress HEENT: Normal Neck: No JVD Cardiac: Regular Rate and Rhythm  Abdomen: Soft, non-tender, non-distended, no mass, palpable aortic pulsation Skin: No rash, no ulcer slightly thickened edematous skin below the knee bilaterally Extremity Pulses:  2+ radial, brachial, femoral, absent popliteal dorsalis pedis, posterior tibial pulses bilaterally Musculoskeletal: No deformity trace pretibial edema laterally  Neurologic: Upper and lower extremity motor 5/5 and symmetric  DATA:  Patient had bilateral ABIs performed on November 24, 2018.  Right side was 0.5 left side 0.6 duplex ultrasound showed right superficial femoral artery occlusion.  I reviewed the studies today.  ASSESSMENT: Bilateral peripheral arterial disease.  Currently asymptomatic.  She has had a couple bouts of cellulitis recently.  She has reasonable perfusion to both legs for the amount of activity that she currently does.  In light of her age I would favor observation for now as well as smoking cessation and trying to start walking 15 to 30 minutes/day.  If she has recurrent episodes of cellulitis we would consider aortogram possible intervention of her right SFA occlusion.  However she would be high risk for any operation due to her age and pulmonary dysfunction.   PLAN: Patient will follow-up in 1 month  for repeat ABIs and to see how she is doing with walking and cigarette smoking.  Would consider an intervention only if she was in a limb threatening situation.  She currently is not.   Ruta Hinds, MD [facility] Office: [phone] Pager: [phone]

## 2018-12-19 ENCOUNTER — Inpatient Hospital Stay: Payer: Medicare HMO | Attending: Oncology | Admitting: Adult Health

## 2018-12-19 ENCOUNTER — Encounter: Payer: Self-pay | Admitting: Adult Health

## 2018-12-19 DIAGNOSIS — F17210 Nicotine dependence, cigarettes, uncomplicated: Secondary | ICD-10-CM | POA: Diagnosis not present

## 2018-12-19 DIAGNOSIS — C50411 Malignant neoplasm of upper-outer quadrant of right female breast: Secondary | ICD-10-CM | POA: Diagnosis not present

## 2018-12-19 DIAGNOSIS — Z170 Estrogen receptor positive status [ER+]: Secondary | ICD-10-CM | POA: Diagnosis not present

## 2018-12-19 DIAGNOSIS — Z79899 Other long term (current) drug therapy: Secondary | ICD-10-CM | POA: Diagnosis not present

## 2018-12-19 DIAGNOSIS — Z79811 Long term (current) use of aromatase inhibitors: Secondary | ICD-10-CM

## 2018-12-19 DIAGNOSIS — E2839 Other primary ovarian failure: Secondary | ICD-10-CM

## 2018-12-19 DIAGNOSIS — Z9221 Personal history of antineoplastic chemotherapy: Secondary | ICD-10-CM

## 2018-12-19 DIAGNOSIS — M810 Age-related osteoporosis without current pathological fracture: Secondary | ICD-10-CM

## 2018-12-19 NOTE — Progress Notes (Signed)
[place]  [phone] [phone]     ID: Gabrielle [name] DOB: November 07, 1930  MR#: [id number]  GYJ#:[id number]  Patient Care Team: Ma Hillock, DO as PCP - General (Family Medicine) Excell Seltzer, MD as Consulting Physician (General Surgery) Magrinat, Virgie Dad, MD as Consulting Physician (Oncology) Arloa Koh, MD (Inactive) as Consulting Physician (Radiation Oncology) Jarome Matin, MD as Consulting Physician (Dermatology) Brand Males, MD as Consulting Physician (Pulmonary Disease) Alda Berthold, DO as Consulting Physician (Neurology) Armbruster, Carlota Raspberry, MD as Consulting Physician (Gastroenterology) OTHER MD: Joylene John, DDS  CHIEF COMPLAINT: Triple positive breast cancer  CURRENT TREATMENT:  Anastrozole  I connected with Gabrielle [name] on 12/19/18 at  2:30 PM EDT by telephone and verified that I am speaking with the correct person using two identifiers.  I discussed the limitations, risks, security and privacy concerns of performing an evaluation and management service by telephone and the availability of in person appointments.  I also discussed with the patient that there may be a patient responsible charge related to this service. The patient expressed understanding and agreed to proceed.    BREAST CANCER HISTORY: From the original intake note:  Gabrielle [name] had not been to a doctor for "more than 20 years. Since Christmas 2014 and the family has become increasingly concerned that she appeared to be losing weight and was a bit more confused. They asked the patient how much weight she had lost and she said she had lost "about 50 pounds.". However, the only weight we have available before August of this year is from August of 2014 and it was 115 pounds at that time.  Nevertheless with this concern the patient agreed to be seen by a physician and on 10/06/2013 she was seen by Dr. Kathlen Mody who obtained a full battery of tests. He found the  patient to have a urinary tract infection with Escherichia coli, which may explain some of her confusion. Vitamin B 12 level was 215, which is in the low normal range. Hemoglobin was 14.7 with an MCV of 99.0. The patient was started on B12 supplementation parenterally and a chest x-ray was obtained 10/05/2013 which showed evidence of emphysema and prior granulomatous disease. To make sure an occult cancer was not being missed a CT scan of the chest abdomen and pelvis was obtained 11/19/2013. This showed a calcified granuloma in the inferior lingula. There was also a noncalcified 6 mm nodule in the lateral portion of the right lower lobe. There was no mediastinal mass or adenopathy. There were calcified granulomata in the spleen as well. There was significant atherosclerotic calcifications. There was sigmoid diverticulosis noted incidentally. In addition, a 1.8 cm right breast mass was noted.  The breast mass was evaluated further with bilateral diagnostic mammography and right breast ultrasonography of the breast Center 11/26/2013. The breast density was category C. There was indeed an irregular mass in the right breast which was on palpation firm nontender and noted at the 10:30 position. Ultrasound confirmed a hypoechoic irregular mass measuring up to 3 cm. There was no right axillary adenopathy noted.  Biopsy of the right breast mass in question 11/26/2013 showed ([id number]) and invasive ductal carcinoma, grade 2, estrogen receptor 100% positive, progesterone receptor 59% positive, both with strong staining intensity, with an MIB-1 of 31%, and with HER-2 amplification, the signals ratio being 2.79 and the copy number per cell 5.45.  On 11-2013 the patient underwent bilateral breast MRI. This showed the enhancing mass in the upper-outer  quadrant of the right breast to measure 2.1 cm. There was no other findings in the right breast, left breast, or either axilla.  The patient's subsequent history is as  detailed below  INTERVAL HISTORY: Gabrielle [name] returns today for follow-up of her triple positive breast cancer.   The patient continues on anastrozole, which she is tolerating well. She denies any issues with taking it such as hot flashes, vaginal dryness, or arthralgias.  She has taken this for 5 years.    Her last mammogram was 09/14/2017 at [facility] showing: breast density category C. There was no evidence of malignancy.  She also completed a Chest CT on 06/01/2017 showing: Continued interval improvement in multifocal pulmonary opacities. Residua identified in the posterior right upper lobe, right middle lobe, and to a modest degree in each lower lobe may reflect incomplete resolution, evolving scar, or recurrent disease. Features may be related to atypical infection although aspiration can have this appearance.  Emphysema. (MPN36-R44.9)  Aortic Atherosclerois (ICD10-170.0).  Dr. Chase Caller follows this.    Finally, she underwent a DEXA scan on 03/22/2017, showing a T-score of -3.1, which is considered osteoporotic.   REVIEW OF SYSTEMS: Gabrielle [name] is doing well.  She has no issues today.  She notes she has some vascular disease in her legs.  She has had some leg swelling and did undergo bilateral dopplers of her legs which were normal.  She continues to smoke about 1/2 pack of cigarettes per day.  She is considering quitting.    Gabrielle [name] denies any fever or chills.  She is without chest pain, palpitations, cough, shortness of breath.  She has no nausea, vomiting, bowel/bladder changes.  A detailed ROS was otherwise non contributory.     PAST MEDICAL HISTORY: Past Medical History:  Diagnosis Date   Anxiety    Breast cancer of upper-outer quadrant of right female breast ([place]) 10/2013   Anastrazole since 12/2013--plan is to take this for 5 yrs.  No evidence of dz recurrence as of 11/2015 oncol f/u.   Cataract    COPD (chronic obstructive pulmonary disease) (HCC)    Macular  degeneration    Osteoporosis 10/2014   T-score -2.8   Pneumonia    hx of    Retina hole    Skin cancer    Stroke ([place])    Tobacco dependence     PAST SURGICAL HISTORY: Past Surgical History:  Procedure Laterality Date   ABDOMINAL HYSTERECTOMY  1980   (ovaries intact)   APPENDECTOMY     BREAST BIOPSY Right    CATARACT EXTRACTION Bilateral    MASTECTOMY Right    MOHS SURGERY     Dr. Sarajane Jews   PARS PLANA VITRECTOMY W/ REPAIR OF MACULAR HOLE Right    RETINAL LASER PROCEDURE     TOTAL MASTECTOMY Right 12/18/2013   Procedure: RIGHT TOTAL MASTECTOMY;  Surgeon: Excell Seltzer, MD;  Location: [facility];  Service: General;  Laterality: Right;    FAMILY HISTORY Family History  Problem Relation Age of Onset   Heart attack Mother        Deceased   Cancer Mother        fallopian tube cancer in 10s; deceased 46   Throat cancer Father        Deceased 73; smoker   Diabetes Brother    Prostate cancer Brother 80       Currently 42   Other Brother        Deceased, hemorhhage of pancreas   Leukemia Maternal  Aunt    Breast cancer Paternal Aunt        age at diagnosis unknown   Throat cancer Paternal Uncle        Deceased 10s; smoker   Leukemia Paternal Grandmother    the patient's father died at the age of 60 from throat cancer which has been diagnosed to years before. The patient's mother was diagnosed at age 47 with fallopian tube carcinoma. She died at age 26. There is no other history of breast or ovarian cancer in the family to the patient's knowledge  GYNECOLOGIC HISTORY:  No LMP recorded. Patient has had a hysterectomy. Menarche age 87, first live birth age 74. The patient is GX P2. She underwent hysterectomy at a relatively young age, without salpingo-oophorectomy. She did not take hormone replacement however she took birth control pills remotely for approximately 2 years with no complications.  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 She is divorced. Her son Gabrielle [name] lives in [place] and works as a Metallurgist. Daughter Gabrielle [name] lives in [place] where she works as a Architect for the [employer]. Incidentally Dr. [name] is a nephew of the patient    ADVANCED DIRECTIVES: In place; the patient's son Gabrielle [name] is her healthcare power of attorney. He can be reached at [phone] [phone]: Social History   Tobacco Use   Smoking status: Current Every Day Smoker    Packs/day: 0.50    Years: 60.00    Pack years: 30.00    Types: Cigarettes   Smokeless tobacco: Never Used   Tobacco comment: 1/2 ppd 4.4.2019  Substance Use Topics   Alcohol use: Yes    Alcohol/week: 2.0 standard drinks    Types: 2 Glasses of wine per week    Comment: occasional   Drug use: No     Colonoscopy: Never  PAP:  Bone density: September 2016: Osteoporosis  Lipid panel:  No Known Allergies  Current Outpatient Medications  Medication Sig Dispense Refill   anastrozole (ARIMIDEX) 1 MG tablet TAKE 1 TABLET IN THE MORNING. 30 tablet 2   Cholecalciferol 2000 units CAPS Take 1 capsule (2,000 Units total) by mouth daily. 90 each 1   cyanocobalamin 1000 MCG tablet Take 1,000 mcg by mouth daily.     donepezil (ARICEPT) 10 MG tablet TAKE 1 TABLET BY MOUTH DAILY. 30 tablet 0   mirtazapine (REMERON) 7.5 MG tablet Take 1 tablet (7.5 mg total) by mouth at bedtime. 90 tablet 1   Multiple Vitamins-Minerals (PRESERVISION AREDS 2 PO) Take 1 tablet by mouth 2 (two) times daily.     mupirocin ointment (BACTROBAN) 2 % Place 1 application into the nose 2 (two) times daily. 22 g 0   QC LO-DOSE ASPIRIN 81 MG EC tablet TAKE 1 TABLET IN THE MORNING. 30 tablet 5   SPIRIVA RESPIMAT 2.5 MCG/ACT AERS INHALE 2 PUFFS INTO THE LUNGS DAILY. 4 g 0   triamcinolone cream (KENALOG) 0.1 % Apply 1 application topically 2 (two) times daily. 80 g 2   No current  facility-administered medications for this visit.     OBJECTIVE: Elderly white woman who appears stated age There were no vitals filed for this visit.   There is no height or weight on file to calculate BMI.    ECOG FS:2 - Symptomatic, <50% confined to bed Patient sounds well.  No apparent distress.  Mood and behavior are normal.  Breathing is non labored. LAB RESULTS:  CMP     Component Value Date/Time   NA 141 11/12/2018 1536   NA 143 12/13/2016 1259   K 4.5 11/12/2018 1536   K 4.1 12/13/2016 1259   CL 107 11/12/2018 1536   CO2 26 11/12/2018 1536   CO2 30 (H) 12/13/2016 1259   GLUCOSE 92 11/12/2018 1536   GLUCOSE 83 12/13/2016 1259   BUN 19 11/12/2018 1536   BUN 19.2 12/13/2016 1259   CREATININE 0.95 11/12/2018 1536   CREATININE 0.87 02/14/2018 1348   CREATININE 0.8 12/13/2016 1259   CALCIUM 9.0 11/12/2018 1536   CALCIUM 9.4 12/13/2016 1259   PROT 6.7 02/14/2018 1348   PROT 6.7 12/13/2016 1259   ALBUMIN 3.6 02/14/2018 1348   ALBUMIN 3.4 (L) 12/13/2016 1259   AST 17 02/14/2018 1348   AST 25 12/13/2016 1259   ALT 11 02/14/2018 1348   ALT 21 12/13/2016 1259   ALKPHOS 82 02/14/2018 1348   ALKPHOS 89 12/13/2016 1259   BILITOT 0.7 02/14/2018 1348   BILITOT 0.32 12/13/2016 1259   GFRNONAA 54 (L) 11/12/2018 1536   GFRNONAA >60 02/14/2018 1348   GFRAA >60 11/12/2018 1536   GFRAA >60 02/14/2018 1348    I No results found for: SPEP  Lab Results  Component Value Date   WBC 8.0 11/20/2018   NEUTROABS 5.5 11/20/2018   HGB 14.0 11/20/2018   HCT 42.5 11/20/2018   MCV 98.5 11/20/2018   PLT 284.0 11/20/2018      Chemistry      Component Value Date/Time   NA 141 11/12/2018 1536   NA 143 12/13/2016 1259   K 4.5 11/12/2018 1536   K 4.1 12/13/2016 1259   CL 107 11/12/2018 1536   CO2 26 11/12/2018 1536   CO2 30 (H) 12/13/2016 1259   BUN 19 11/12/2018 1536   BUN 19.2 12/13/2016 1259   CREATININE 0.95 11/12/2018 1536   CREATININE 0.87 02/14/2018 1348   CREATININE 0.8  12/13/2016 1259      Component Value Date/Time   CALCIUM 9.0 11/12/2018 1536   CALCIUM 9.4 12/13/2016 1259   ALKPHOS 82 02/14/2018 1348   ALKPHOS 89 12/13/2016 1259   AST 17 02/14/2018 1348   AST 25 12/13/2016 1259   ALT 11 02/14/2018 1348   ALT 21 12/13/2016 1259   BILITOT 0.7 02/14/2018 1348   BILITOT 0.32 12/13/2016 1259       No results found for: LABCA2  No components found for: LABCA125  No results for input(s): INR in the last 168 hours.  Urinalysis    Component Value Date/Time   COLORURINE YELLOW 10/05/2013 1158   APPEARANCEUR Cloudy (A) 10/05/2013 1158   LABSPEC 1.010 10/05/2013 1158   PHURINE 8.5 (A) 10/05/2013 1158   GLUCOSEU NEGATIVE 10/05/2013 1158   HGBUR NEGATIVE 10/05/2013 1158   BILIRUBINUR neg 01/22/2014 1203   KETONESUR NEGATIVE 10/05/2013 1158   PROTEINUR neg 01/22/2014 1203   UROBILINOGEN 0.2 01/22/2014 1203   UROBILINOGEN 0.2 10/05/2013 1158   NITRITE positive 01/22/2014 1203   NITRITE NEGATIVE 10/05/2013 1158   LEUKOCYTESUR small (1+) 01/22/2014 1203    STUDIES: Mammography and bone density results discussed with the patient  ASSESSMENT: 85 y.o. BRCA negative [place] woman status post right breast upper outer quadrant biopsy 11/26/2013 for a clinical T2 N0, stage IIA invasive ductal carcinoma, grade 2, with micropapillary features, triple positive, with an MIB-1 of 31%  (1) right simple mastectomy 12/18/2013 showed a pT2 pNX, stage II invasive  ductal carcinoma, grade 2, with negative margins.  (2) 6 mm right lower lobe nodule noted on CT scans 11/19/2013  (a) continuing tobacco abuse-  (b) emphysema  (c) most recent chest CT 11/24/2016 shows no evidence of malignancy  (3) progressive weight loss: stabilized  (4) started anastrozole 01/03/2014  (a) denosumab/Prolia discussed with the patient 12/13/2016  (b) osteoporosis- Bone density 11/25/2014 showed a T score of -2.8  (c) repeat bone density 03/22/2017 shows a T score of  -3.1  (5) started trastuzumab 02/05/2014; completed one year, last dose 02/27/2015  (a) echo  02/27/2015 shows an EF of 60-65%   (6) tobacco abuse disorder: the patient has been strongly advised to discontinue smoking   PLAN: Saraiyah is doing well and has no signs of recurrence.  We discussed the Anastrozole and use past 5 years.  After discussion, she decided that she will continue anastrozole since she is tolerating it well and has some benefit past 5 years of therapy.  She knows that it can worsen her bone density along with her tobacco use.  She and I discussed repeating bone density testing in 04/2019 and if worse, consider stopping anastrozole at that point.    Margareth is overdue for her mammogram.  She wants to postpone this due to covid19, I will delay until 04/2019.    We discussed smoking cessation.  She is not yet ready to quit.  I talked to her about nicotine patches and chantix today.  She is going to think about it.    I recommended healthy diet and exercise today.    We will see Gabrielle [name] back in 05/2019 for labs and f/u with Dr. Jana Hakim.  She was recommended to continue with the appropriate pandemic precautions.   The patient was provided an opportunity to ask questions and all were answered. The patient agreed with the plan and demonstrated an understanding of the instructions.   The patient was advised to call back or seek an in-person evaluation if the symptoms worsen or if the condition fails to improve as anticipated.   I provided 13 minutes of non face-to-face telephone visit time during this encounter, and > 50% was spent counseling as documented under my assessment & plan.  Wilber Bihari, NP  12/19/18 3:14 PM Medical Oncology and Hematology [facility] [address] [place], [place] [address] Tel. [phone]    Fax. [phone]

## 2018-12-21 ENCOUNTER — Telehealth: Payer: Self-pay | Admitting: Oncology

## 2018-12-21 NOTE — Telephone Encounter (Signed)
I left a message regarding schedule  

## 2018-12-27 ENCOUNTER — Telehealth: Payer: Self-pay | Admitting: Neurology

## 2018-12-27 DIAGNOSIS — F0151 Vascular dementia with behavioral disturbance: Secondary | ICD-10-CM

## 2018-12-27 DIAGNOSIS — F423 Hoarding disorder: Secondary | ICD-10-CM

## 2018-12-27 DIAGNOSIS — F01518 Vascular dementia, unspecified severity, with other behavioral disturbance: Secondary | ICD-10-CM

## 2018-12-27 NOTE — Telephone Encounter (Signed)
1. Patient's daughter called and scheduled a telephone visit on 01/17/19 at 10:50 AM with Dr. Posey Pronto. She declined to schedule an office visit in person at this time because of the Covid-19 virus concerns. She said her mom cannot do a virtual visit and there is no one to assist her. Please confirm a telephone visit is okay for this patient?  2. This patient needs refills on Aricept 10 MG sent to [facility], if possible to last until her telephone appointment.

## 2018-12-27 NOTE — Telephone Encounter (Signed)
Last office visit 05/30/17. This April was cancelled.   1. Dr. Posey Pronto - ok for telephone visit for 11/18 appt? Please see Starla's note regarding this below.  2. Aricept 10mg  was ordered 10/30/18. In Epic looked like  Christy reordered it on10/14/20 and sent to [facility].  I called [facility] and they did not get that script.  Dr. Posey Pronto - is it ok to refill one 30 day supply of Aricept 10mg  until appt?

## 2018-12-28 ENCOUNTER — Other Ambulatory Visit: Payer: Self-pay | Admitting: Neurology

## 2018-12-28 DIAGNOSIS — F01518 Vascular dementia, unspecified severity, with other behavioral disturbance: Secondary | ICD-10-CM

## 2018-12-28 DIAGNOSIS — F423 Hoarding disorder: Secondary | ICD-10-CM

## 2018-12-28 DIAGNOSIS — F0151 Vascular dementia with behavioral disturbance: Secondary | ICD-10-CM

## 2018-12-28 MED ORDER — DONEPEZIL HCL 10 MG PO TABS: 10.0000 mg | ORAL_TABLET | Freq: Every day | ORAL | 0 refills | Status: DC

## 2018-12-28 NOTE — Telephone Encounter (Signed)
Spoke to daughter [name] and informed her that 30 day supply of the Aricept 10mg  will be sent to [facility]. Also told daughter that telephone visit is fine per MD and confirmed time/date. Verbalized understanding of all above.  Script sent to [facility].

## 2018-12-28 NOTE — Telephone Encounter (Signed)
OK for telephone visit and to send 30-day supply of Aricept.  Thanks.

## 2019-01-03 ENCOUNTER — Encounter: Payer: Medicare HMO | Admitting: Vascular Surgery

## 2019-01-16 ENCOUNTER — Other Ambulatory Visit: Payer: Self-pay

## 2019-01-16 DIAGNOSIS — I739 Peripheral vascular disease, unspecified: Secondary | ICD-10-CM

## 2019-01-17 ENCOUNTER — Other Ambulatory Visit: Payer: Self-pay

## 2019-01-17 ENCOUNTER — Telehealth (HOSPITAL_COMMUNITY): Payer: Self-pay

## 2019-01-17 ENCOUNTER — Encounter: Payer: Self-pay | Admitting: Neurology

## 2019-01-17 ENCOUNTER — Telehealth (INDEPENDENT_AMBULATORY_CARE_PROVIDER_SITE_OTHER): Payer: Medicare HMO | Admitting: Neurology

## 2019-01-17 DIAGNOSIS — F0151 Vascular dementia with behavioral disturbance: Secondary | ICD-10-CM | POA: Diagnosis not present

## 2019-01-17 DIAGNOSIS — F01518 Vascular dementia, unspecified severity, with other behavioral disturbance: Secondary | ICD-10-CM

## 2019-01-17 DIAGNOSIS — F423 Hoarding disorder: Secondary | ICD-10-CM

## 2019-01-17 MED ORDER — DONEPEZIL HCL 10 MG PO TABS: 10.0000 mg | ORAL_TABLET | Freq: Every day | ORAL | 3 refills | Status: DC

## 2019-01-17 NOTE — Telephone Encounter (Signed)

## 2019-01-17 NOTE — Progress Notes (Signed)
    Virtual Visit via Telephone Note The purpose of this virtual visit is to provide medical care while limiting exposure to the novel coronavirus.    Consent was obtained for phone visit:  Yes.   Answered questions that patient had about telehealth interaction:  Yes.   I discussed the limitations, risks, security and privacy concerns of performing an evaluation and management service by telephone. I also discussed with the patient that there may be a patient responsible charge related to this service. The patient expressed understanding and agreed to proceed.  Pt location: Home Physician Location: office Name of referring provider:  Howard Pouch A, DO I connected with .Gabrielle [name] at patients initiation/request on 01/17/2019 at 10:50 AM EST by telephone and verified that I am speaking with the correct person using two identifiers.  Pt MRN:  [id number] Pt DOB:  1931/02/18   History of Present Illness: This is a 85 year old female returning for follow-up of vascular dementia and medication refills.  She was last seen in April 2019.  Over the past year.  She feels that her memory has progressively gotten worse because her family often have to remind her of things that were previously discussed.  With the help of her family, she did get up-to-date with her tax filings.  Her son continues to be POA and manages finances.  At her prior visits, there was concern of hoarding behavior.  She tells me that she is trying to clean up her home but continues to get a lot of mail.  She is socially isolating at home and gets her medications and groceries delivered.  She has not driven anywhere, and is getting bored at home.  She denies any new complaints.   Assessment and Plan:   Vascular dementia with behavioral changes, mild progression as expected with the course of the disease.  Family is helping with most of her IADLs and she continues to be independent with ADLs.  I do not advise that she drive. Refills  were sent for Aricept 10 mg daily Continue aspirin 81 mg daily She has reduced smoking to half a pack per day and I encouraged her to continue to try to quit    Follow Up Instructions:   I discussed the assessment and treatment plan with the patient. The patient was provided an opportunity to ask questions and all were answered. The patient agreed with the plan and demonstrated an understanding of the instructions.   The patient was advised to call back or seek an in-person evaluation if the symptoms worsen or if the condition fails to improve as anticipated.  Return to clinic in 1 year   Total Time spent in visit with the patient was:  15 min, of which 100% of the time was spent in counseling and/or coordinating care.   Pt understands and agrees with the plan of care outlined.     Alda Berthold, DO

## 2019-01-18 ENCOUNTER — Ambulatory Visit: Payer: Medicare HMO | Admitting: Vascular Surgery

## 2019-01-18 ENCOUNTER — Ambulatory Visit (HOSPITAL_COMMUNITY)
Admission: RE | Admit: 2019-01-18 | Discharge: 2019-01-18 | Disposition: A | Discharge status: Home / Self Care | Payer: Medicare HMO | Source: Ambulatory Visit | Attending: Family | Admitting: Family

## 2019-01-18 ENCOUNTER — Encounter: Payer: Self-pay | Admitting: Vascular Surgery

## 2019-01-18 VITALS — BP 112/68 | HR 83 | Temp 97.3°F | Resp 20 | Ht 65.0 in | Wt 121.0 lb

## 2019-01-18 DIAGNOSIS — I739 Peripheral vascular disease, unspecified: Secondary | ICD-10-CM

## 2019-01-18 DIAGNOSIS — J449 Chronic obstructive pulmonary disease, unspecified: Secondary | ICD-10-CM | POA: Diagnosis not present

## 2019-01-18 MED ORDER — VARENICLINE TARTRATE 0.5 MG PO TABS: 0.5000 mg | ORAL_TABLET | Freq: Two times a day (BID) | ORAL | 5 refills | Status: DC

## 2019-01-18 MED ORDER — VARENICLINE TARTRATE 1 MG PO TABS: 1.0000 mg | ORAL_TABLET | Freq: Two times a day (BID) | ORAL | 0 refills | Status: AC

## 2019-01-18 NOTE — Progress Notes (Signed)
Patient name: Gabrielle [name] MRN: [id number] DOB: 16-Oct-1930 Sex: female  HPI: Gabrielle [name] is a 85 y.o. female last seen October 15 for episodes of intermittent cellulitis.  She was noted to have peripheral arterial disease at that point.  We discussed smoking cessation and she was scheduled for close follow-up.  She still has no skin breakdown on her legs.  She does have numbness that occurs in her right foot when she first wakes up in the morning.  After walking around some the numbness dissipates.  She does not really describe rest pain.  She has no claudication symptoms but does not walk around very much.  She continues to smoke and has not really had any success in quitting.  She did inquire today about going to a pill to assist with this.  Other medical problems include COPD and prior history of skin cancer.  Those have both been stable.  She is not currently on aspirin or statin.  Past Medical History:  Diagnosis Date  . Anxiety   . Breast cancer of upper-outer quadrant of right female breast ([place]) 10/2013   Anastrazole since 12/2013--plan is to take this for 5 yrs.  No evidence of dz recurrence as of 11/2015 oncol f/u.  [facility] Cataract   . COPD (chronic obstructive pulmonary disease) ([place])   . Macular degeneration   . Osteoporosis 10/2014   T-score -2.8  . Pneumonia    hx of   . Retina hole   . Skin cancer   . Stroke ([place])   . Tobacco dependence    Past Surgical History:  Procedure Laterality Date  . ABDOMINAL HYSTERECTOMY  1980   (ovaries intact)  . APPENDECTOMY    . BREAST BIOPSY Right   . CATARACT EXTRACTION Bilateral   . MASTECTOMY Right   . MOHS SURGERY     Dr. Sarajane Jews  . PARS PLANA VITRECTOMY W/ REPAIR OF MACULAR HOLE Right   . RETINAL LASER PROCEDURE    . TOTAL MASTECTOMY Right 12/18/2013   Procedure: RIGHT TOTAL MASTECTOMY;  Surgeon: Excell Seltzer, MD;  Location: [facility];  Service: General;  Laterality: Right;    Family History  Problem Relation Age of Onset   . Heart attack Mother        Deceased  . Cancer Mother        fallopian tube cancer in 72s; deceased 3  . Throat cancer Father        Deceased 64; smoker  . Diabetes Brother   . Prostate cancer Brother 56       Currently 82  . Other Brother        Deceased, hemorhhage of pancreas  . Leukemia Maternal Aunt   . Breast cancer Paternal Aunt        age at diagnosis unknown  . Throat cancer Paternal Uncle        Deceased 14s; smoker  . Leukemia Paternal Grandmother     SOCIAL HISTORY: Social History   Socioeconomic History  . Marital status: Divorced    Spouse name: Not on file  . Number of children: 2  . Years of education: Not on file  . Highest education level: Some college, no degree  Occupational History  . Occupation: tax Comptroller: RETIRED    Comment: [employer]  . Financial resource strain: Not on file  . Food insecurity    Worry: Not on file    Inability: Not on file  .  Transportation needs    Medical: Not on file    Non-medical: Not on file  Tobacco Use  . Smoking status: Current Every Day Smoker    Packs/day: 0.50    Years: 60.00    Pack years: 30.00    Types: Cigarettes  . Smokeless tobacco: Never Used  . Tobacco comment: 1/2 ppd 4.4.2019  Substance and Sexual Activity  . Alcohol use: Yes    Alcohol/week: 2.0 standard drinks    Types: 2 Glasses of wine per week    Comment: occasional  . Drug use: No  . Sexual activity: Never  Lifestyle  . Physical activity    Days per week: Not on file    Minutes per session: Not on file  . Stress: Not on file  Relationships  . Social Herbalist on phone: Not on file    Gets together: Not on file    Attends religious service: Not on file    Active member of club or organization: Not on file    Attends meetings of clubs or organizations: Not on file    Relationship status: Not on file  . Intimate partner violence    Fear of current or ex partner: Not on file    Emotionally  abused: Not on file    Physically abused: Not on file    Forced sexual activity: Not on file  Other Topics Concern  . Not on file  Social History Narrative   Ms. Kroenke lives alone in a [place] home. She has two grown children.   She continues to work part-time for [employer].    She has a grown son-lives in [place] in [place]. [name] is her nephew.   Right handed   One story home    No Known Allergies  Current Outpatient Medications  Medication Sig Dispense Refill  . anastrozole (ARIMIDEX) 1 MG tablet TAKE 1 TABLET IN THE MORNING. 30 tablet 2  . Cholecalciferol 2000 units CAPS Take 1 capsule (2,000 Units total) by mouth daily. 90 each 1  . cyanocobalamin 1000 MCG tablet Take 1,000 mcg by mouth daily.    [facility] donepezil (ARICEPT) 10 MG tablet Take 1 tablet (10 mg total) by mouth daily. 90 tablet 3  . mirtazapine (REMERON) 7.5 MG tablet Take 1 tablet (7.5 mg total) by mouth at bedtime. 90 tablet 1  . Multiple Vitamins-Minerals (PRESERVISION AREDS 2 PO) Take 1 tablet by mouth 2 (two) times daily.    . mupirocin ointment (BACTROBAN) 2 % Place 1 application into the nose 2 (two) times daily. 22 g 0  . QC LO-DOSE ASPIRIN 81 MG EC tablet TAKE 1 TABLET IN THE MORNING. 30 tablet 5  . SPIRIVA RESPIMAT 2.5 MCG/ACT AERS INHALE 2 PUFFS INTO THE LUNGS DAILY. 4 g 0  . triamcinolone cream (KENALOG) 0.1 % Apply 1 application topically 2 (two) times daily. 80 g 2   No current facility-administered medications for this visit.     ROS:   General:  No weight loss, Fever, chills  HEENT: No recent headaches, no nasal bleeding, no visual changes, no sore throat  Neurologic: No dizziness, blackouts, seizures. No recent symptoms of stroke or mini- stroke. No recent episodes of slurred speech, or temporary blindness.  Cardiac: No recent episodes of chest pain/pressure, no shortness of breath at rest.  No shortness of breath with exertion.  Denies history of atrial fibrillation or  irregular heartbeat  Vascular: No history of rest pain in  feet.  No history of claudication.  No history of non-healing ulcer, No history of DVT   Pulmonary: No home oxygen, no productive cough, no hemoptysis,  No asthma or wheezing  Musculoskeletal:  [ ]  Arthritis, [ ]  Low back pain,  [ ]  Joint pain  Hematologic:No history of hypercoagulable state.  No history of easy bleeding.  No history of anemia  Gastrointestinal: No hematochezia or melena,  No gastroesophageal reflux, no trouble swallowing  Urinary: [ ]  chronic Kidney disease, [ ]  on HD - [ ]  MWF or [ ]  TTHS, [ ]  Burning with urination, [ ]  Frequent urination, [ ]  Difficulty urinating;   Skin: No rashes  Psychological: No history of anxiety,  No history of depression   Physical Examination  Vitals:   01/18/19 1424  BP: 112/68  Pulse: 83  Resp: 20  Temp: (!) 97.3 F (36.3 C)  SpO2: 97%  Weight: 121 lb (54.9 kg)  Height: 5\' 5"  (1.651 m)    Body mass index is 20.14 kg/m.  General:  Alert and oriented, no acute distress HEENT: Normal Neck: No  JVD Cardiac: Regular Rate and Rhythm Skin: No rash, mild erythema right leg gaiter area Extremity Pulses:  2+ radial, brachial, femoral, absent popliteal dorsalis pedis, posterior tibial pulses bilaterally Musculoskeletal: No deformity or edema  Neurologic: Upper and lower extremity motor 5/5 and symmetric  DATA:  Patient had bilateral ABIs performed today which were 0.7 on the left 0.6 on the right  ASSESSMENT: Patient with peripheral arterial disease currently mildly symptomatic and not in a scenario of limb threatening ischemia.   PLAN: Patient is going to continue to try to quit smoking.  She was given a prescription for Chantix today.  She will follow-up with me in 1 month's time to recheck her feet and legs and see if she has had any success in quitting smoking.  Patient's son was present for the office visit today.   Ruta Hinds, MD [facility] Office: [phone] Pager: [phone]

## 2019-01-20 DIAGNOSIS — S81811A Laceration without foreign body, right lower leg, initial encounter: Secondary | ICD-10-CM | POA: Diagnosis not present

## 2019-01-28 DIAGNOSIS — L03115 Cellulitis of right lower limb: Secondary | ICD-10-CM | POA: Diagnosis not present

## 2019-01-28 DIAGNOSIS — Z4802 Encounter for removal of sutures: Secondary | ICD-10-CM | POA: Diagnosis not present

## 2019-02-27 ENCOUNTER — Telehealth: Payer: Self-pay

## 2019-02-27 NOTE — Telephone Encounter (Signed)
Pts daughter, [name] called and LM on nurses line asking for a call back to discuss some things that are going on with her mom. Phone number is [phone]. She is on DPR.  Tried to call daughter back and there was no answer, VM was left to return call

## 2019-02-28 NOTE — Telephone Encounter (Signed)
Tried to call daughter again with no answer. VM left to return call.

## 2019-03-01 NOTE — Telephone Encounter (Signed)
Daughter left message on nurse line to return call. Call was returned and there was no answer. VM was left to return call.

## 2019-03-05 NOTE — Telephone Encounter (Signed)
Pts daughter was called and said her mom is having difficulties with holding her urine and is using the bathroom in her pants x2-3 months. Pt was bought pads to wear. Pt does not feel the urge to use the bathroom and it "dribbles" out. No symptoms of UTI per patient. Pts daughter would like a telephone visit set up for her, scheduled for visit and to leave urine specimen. We will call patients daughter to start telephone visit.

## 2019-03-07 ENCOUNTER — Encounter: Payer: Self-pay | Admitting: Family Medicine

## 2019-03-07 ENCOUNTER — Ambulatory Visit: Payer: Medicare HMO

## 2019-03-07 ENCOUNTER — Other Ambulatory Visit: Payer: Self-pay

## 2019-03-07 ENCOUNTER — Ambulatory Visit (INDEPENDENT_AMBULATORY_CARE_PROVIDER_SITE_OTHER): Payer: Medicare HMO | Admitting: Family Medicine

## 2019-03-07 VITALS — BP 139/67 | HR 75 | Temp 97.4°F | Resp 17 | Ht 65.0 in | Wt 118.4 lb

## 2019-03-07 DIAGNOSIS — J449 Chronic obstructive pulmonary disease, unspecified: Secondary | ICD-10-CM | POA: Diagnosis not present

## 2019-03-07 DIAGNOSIS — N39498 Other specified urinary incontinence: Secondary | ICD-10-CM | POA: Diagnosis not present

## 2019-03-07 DIAGNOSIS — R8290 Unspecified abnormal findings in urine: Secondary | ICD-10-CM | POA: Diagnosis not present

## 2019-03-07 LAB — POCT URINALYSIS DIPSTICK
Bilirubin, UA: NEGATIVE
Blood, UA: 10
Glucose, UA: NEGATIVE
Ketones, UA: NEGATIVE
Nitrite, UA: NEGATIVE
Protein, UA: POSITIVE — AB
Spec Grav, UA: 1.015 (ref 1.010–1.025)
Urobilinogen, UA: 0.2 E.U./dL
pH, UA: 7.5 (ref 5.0–8.0)

## 2019-03-07 NOTE — Patient Instructions (Signed)
You can call to sign up for your vaccine - Call Friday.  covid vaccines: Pay attention to the news the next few weeks- COVID vaccines are starting for the community.  Appointments are required and can be made beginning at 8 a.m. Once your age bracket is called (watch news and visit website below for info) appt can be made by calling [phone] and selecting option 2. Walk-ins will not be accepted.                  Clinic locations are: [facility], [place]; [facility] [address] [place]; . [place] [facility], [address], [id number], [place].  Participants are asked to wear a face covering at vaccination sites.  Visit [email or web address] and click on the "[id number] Vaccine Info" rectangle for more information about vaccinations.   I will call you in 1-2 days once I get your urine results. If it is from infection- we will treat with an antibiotic.  If there is no infection we will refer you to a specialist called urologist to evaluate cause and offer treatment.     Urinary Incontinence  Urinary incontinence refers to a condition in which a person is unable to control where and when to pass urine. A person with this condition will urinate when he or she does not mean to (involuntarily). What are the causes? This condition may be caused by:  Medicines.  Infections.  Constipation.  Overactive bladder muscles.  Weak bladder muscles.  Weak pelvic floor muscles. These muscles provide support for the bladder, intestine, and, in women, the uterus.  Enlarged prostate in men. The prostate is a gland near the bladder. When it gets too big, it can pinch the urethra. With the urethra blocked, the bladder can weaken and lose the ability to empty properly.  Surgery.  Emotional factors, such as anxiety, stress, or post-traumatic stress disorder  (PTSD).  Pelvic organ prolapse. This happens in women when organs shift out of place and into the vagina. This shift can prevent the bladder and urethra from working properly. What increases the risk? The following factors may make you more likely to develop this condition:  Older age.  Obesity and physical inactivity.  Pregnancy and childbirth.  Menopause.  Diseases that affect the nerves or spinal cord (neurological diseases).  Long-term (chronic) coughing. This can increase pressure on the bladder and pelvic floor muscles. What are the signs or symptoms? Symptoms may vary depending on the type of urinary incontinence you have. They include:  A sudden urge to urinate, but passing urine involuntarily before you can get to a bathroom (urge incontinence).  Suddenly passing urine with any activity that forces urine to pass, such as coughing, laughing, exercise, or sneezing (stress incontinence).  Needing to urinate often, but urinating only a small amount, or constantly dribbling urine (overflow incontinence).  Urinating because you cannot get to the bathroom in time due to a physical disability, such as arthritis or injury, or communication and thinking problems, such as Alzheimer disease (functional incontinence). How is this diagnosed? This condition may be diagnosed based on:  Your medical history.  A physical exam.  Tests, such as: ? Urine tests. ? X-rays of your kidney and bladder. ? Ultrasound. ? CT scan. ? Cystoscopy. In this procedure, a health care provider inserts a tube with a light and camera (cystoscope) through the urethra and into the bladder  in order to check for problems. ? Urodynamic testing. These tests assess how well the bladder, urethra, and sphincter can store and release urine. There are different types of urodynamic tests, and they vary depending on what the test is measuring. To help diagnose your condition, your health care provider may recommend  that you keep a log of when you urinate and how much you urinate. How is this treated? Treatment for this condition depends on the type of incontinence that you have and its cause. Treatment may include:  Lifestyle changes, such as: ? Quitting smoking. ? Maintaining a healthy weight. ? Staying active. Try to get 150 minutes of moderate-intensity exercise every week. Ask your health care provider which activities are safe for you. ? Eating a healthy diet.  Avoid high-fat foods, like fried foods.  Avoid refined carbohydrates like white bread and white rice.  Limit how much alcohol and caffeine you drink.  Increase your fiber intake. Foods such as fresh fruits, vegetables, beans, and whole grains are healthy sources of fiber.  Pelvic floor muscle exercises.  Bladder training, such as lengthening the amount of time between bathroom breaks, or using the bathroom at regular intervals.  Using techniques to suppress bladder urges. This can include distraction techniques or controlled breathing exercises.  Medicines to relax the bladder muscles and prevent bladder spasms.  Medicines to help slow or prevent the growth of a man's prostate.  Botox injections. These can help relax the bladder muscles.  Using pulses of electricity to help change bladder reflexes (electrical nerve stimulation).  For women, using a medical device to prevent urine leaks. This is a small, tampon-like, disposable device that is inserted into the urethra.  Injecting collagen or carbon beads (bulking agents) into the urinary sphincter. These can help thicken tissue and close the bladder opening.  Surgery. Follow these instructions at home: Lifestyle  Limit alcohol and caffeine. These can fill your bladder quickly and irritate it.  Keep yourself clean to help prevent odors and skin damage. Ask your doctor about special skin creams and cleansers that can protect the skin from urine.  Consider wearing pads or  adult diapers. Make sure to change them regularly, and always change them right after experiencing incontinence. General instructions  Take over-the-counter and prescription medicines only as told by your health care provider.  Use the bathroom about every 3-4 hours, even if you do not feel the need to urinate. Try to empty your bladder completely every time. After urinating, wait a minute. Then try to urinate again.  Make sure you are in a relaxed position while urinating.  If your incontinence is caused by nerve problems, keep a log of the medicines you take and the times you go to the bathroom.  Keep all follow-up visits as told by your health care provider. This is important. Contact a health care provider if:  You have pain that gets worse.  Your incontinence gets worse. Get help right away if:  You have a fever or chills.  You are unable to urinate.  You have redness in your groin area or down your legs. Summary  Urinary incontinence refers to a condition in which a person is unable to control where and when to pass urine.  This condition may be caused by medicines, infection, weak bladder muscles, weak pelvic floor muscles, enlargement of the prostate (in men), or surgery.  The following factors increase your risk for developing this condition: older age, obesity, pregnancy and childbirth, menopause, neurological diseases,  and chronic coughing.  There are several types of urinary incontinence. They include urge incontinence, stress incontinence, overflow incontinence, and functional incontinence.  This condition is usually treated first with lifestyle and behavioral changes, such as quitting smoking, eating a healthier diet, and doing regular pelvic floor exercises. Other treatment options include medicines, bulking agents, medical devices, electrical nerve stimulation, or surgery. This information is not intended to replace advice given to you by your health care provider.  Make sure you discuss any questions you have with your health care provider. Document Revised: 02/25/2017 Document Reviewed: 05/27/2016 Elsevier Patient Education  [place].

## 2019-03-07 NOTE — Progress Notes (Signed)
This visit occurred during the SARS-CoV-2 public health emergency.  Safety protocols were in place, including screening questions prior to the visit, additional usage of staff PPE, and extensive cleaning of exam room while observing appropriate contact time as indicated for disinfecting solutions.    Gabrielle [name] , Dec 31, 1930, 86 y.o., female MRN: [id number] Patient Care Team    Relationship Specialty Notifications Start End  Ma Hillock, DO PCP - General Family Medicine  08/01/15   Excell Seltzer, MD (Inactive) Consulting Physician General Surgery  11/29/13   Magrinat, Virgie Dad, MD Consulting Physician Oncology  11/29/13   Arloa Koh, MD (Inactive) Consulting Physician Radiation Oncology  11/29/13   Jarome Matin, MD Consulting Physician Dermatology  06/11/15   Brand Males, MD Consulting Physician Pulmonary Disease  06/11/15   Alda Berthold, DO Consulting Physician Neurology  08/01/15   Armbruster, Carlota Raspberry, MD Consulting Physician Gastroenterology  12/06/16     Chief Complaint  Patient presents with  . Urinary Frequency    urinary leakage and frequency x1 year, maybe longer, per daughter      Subjective: Pt presents for an OV today to discuss urinary leakage.  Patient states she has noticed an increase in her urinary "leakage "over the last 6 months.  Her family thinks it has been at least a year or longer.  Per medical records it seems to have been present to some extent since 2014.  Patient reports she now has to wear her protective garment/pad when leaving the house.  She endorses urinary leakage being unpredictable.  She does have urinary urgency-but urine leakage is not always associated with the urgency.  She denies urinary leakage with sneezing or coughing.  She states rarely will she have urinary continence during sleep, although she admits it has happened once or twice.  She denies fever, chills, nausea, vomit, burning with urination. Today she reports she recalls  having a "bladder surgery "about 30 years ago.  She does not recall exactly what the surgical procedure was called.  She believes she remembers the surgeon stating that they stretched her urethra.  The surgery was completed secondary to her having difficulty/could not urinate.  She does recall them stating that she may have difficulty down the road with leakage.  Patient has had a total abdominal hysterectomy.  She is a smoker.  Depression screen [facility] 2/9 08/15/2017 06/10/2017 12/06/2016 10/29/2016 08/19/2016  Decreased Interest 0 0 0 0 0  Down, Depressed, Hopeless 0 0 1 3 0  PHQ - 2 Score 0 0 1 3 0  Altered sleeping - - - 0 -  Change in appetite - - - 3 -  Feeling bad or failure about yourself  - - - 3 -  Trouble concentrating - - - 0 -  Moving slowly or fidgety/restless - - - 0 -  Suicidal thoughts - - - 0 -  PHQ-9 Score - - - 3 -  Difficult doing work/chores - - - Not difficult at all -  Some recent data might be hidden    No Known Allergies Social History   Social History Narrative   Ms. Beckum lives alone in a [place] home. She has two grown children.   She continues to work part-time for [employer].    She has a grown son-lives in [place] in [place]. [name] is her nephew.   Right handed   One story home   Past Medical History:  Diagnosis Date  .  Anxiety   . Breast cancer of upper-outer quadrant of right female breast ([place]) 10/2013   Anastrazole since 12/2013--plan is to take this for 5 yrs.  No evidence of dz recurrence as of 11/2015 oncol f/u.  Marland Kitchen Cataract   . COPD (chronic obstructive pulmonary disease) ([place])   . Macular degeneration   . Osteoporosis 10/2014   T-score -2.8  . Pneumonia    hx of   . Retina hole   . Skin cancer   . Stroke ([place])   . Tobacco dependence    Past Surgical History:  Procedure Laterality Date  . ABDOMINAL HYSTERECTOMY  1980   (ovaries intact)  . APPENDECTOMY    . BREAST BIOPSY Right   . CATARACT EXTRACTION Bilateral    . MASTECTOMY Right   . MOHS SURGERY     Dr. Sarajane Jews  . PARS PLANA VITRECTOMY W/ REPAIR OF MACULAR HOLE Right   . RETINAL LASER PROCEDURE    . TOTAL MASTECTOMY Right 12/18/2013   Procedure: RIGHT TOTAL MASTECTOMY;  Surgeon: Excell Seltzer, MD;  Location: [facility]S;  Service: General;  Laterality: Right;   Family History  Problem Relation Age of Onset  . Heart attack Mother        Deceased  . Cancer Mother        fallopian tube cancer in 89s; deceased 16  . Throat cancer Father        Deceased 38; smoker  . Diabetes Brother   . Prostate cancer Brother 38       Currently 4  . Other Brother        Deceased, hemorhhage of pancreas  . Leukemia Maternal Aunt   . Breast cancer Paternal Aunt        age at diagnosis unknown  . Throat cancer Paternal Uncle        Deceased 46s; smoker  . Leukemia Paternal Grandmother    Allergies as of 03/07/2019   No Known Allergies     Medication List       Accurate as of March 07, 2019  5:51 PM. If you have any questions, ask your nurse or doctor.        anastrozole 1 MG tablet Commonly known as: ARIMIDEX TAKE 1 TABLET IN THE MORNING.   Cholecalciferol 50 MCG (2000 UT) Caps Take 1 capsule (2,000 Units total) by mouth daily.   cyanocobalamin 1000 MCG tablet Take 1,000 mcg by mouth daily.   donepezil 10 MG tablet Commonly known as: ARICEPT Take 1 tablet (10 mg total) by mouth daily.   mirtazapine 7.5 MG tablet Commonly known as: REMERON Take 1 tablet (7.5 mg total) by mouth at bedtime.   mupirocin ointment 2 % Commonly known as: Bactroban Place 1 application into the nose 2 (two) times daily.   PRESERVISION AREDS 2 PO Take 1 tablet by mouth 2 (two) times daily.   QC LO-DOSE ASPIRIN 81 MG EC tablet Generic drug: aspirin TAKE 1 TABLET IN THE MORNING.   Spiriva Respimat 2.5 MCG/ACT Aers Generic drug: Tiotropium Bromide Monohydrate INHALE 2 PUFFS INTO THE LUNGS DAILY.   triamcinolone cream 0.1 % Commonly known as:  KENALOG Apply 1 application topically 2 (two) times daily.   varenicline 0.5 MG tablet Commonly known as: Chantix Take 1 tablet (0.5 mg total) by mouth 2 (two) times daily. Start this tablet after 2 weeks       All past medical history, surgical history, allergies, family history, immunizations andmedications were updated in the EMR today and  reviewed under the history and medication portions of their EMR.     ROS: Negative, with the exception of above mentioned in HPI   Objective:  BP 139/67 (BP Location: Right Arm, Patient Position: Sitting, Cuff Size: Normal)   Pulse 75   Temp (!) 97.4 F (36.3 C) (Temporal)   Resp 17   Ht 5\' 5"  (1.651 m)   Wt 118 lb 6 oz (53.7 kg)   SpO2 91%   BMI 19.70 kg/m  Body mass index is 19.7 kg/m. Gen: Afebrile. No acute distress. Nontoxic in appearance, well developed, well nourished.  HENT: AT. [place].  Eyes:Pupils Equal Round Reactive to light, Extraocular movements intact,  Conjunctiva without redness, discharge or icterus. CV: RRR  Abd: Soft. NTND. BS present. MSK: No CVA tenderness Neuro: Normal gait. PERLA. EOMi. Alert. Oriented x3  Psych: Normal affect, dress and demeanor. Normal speech. Normal thought content and judgment.  No exam data present No results found. Results for orders placed or performed in visit on 03/07/19 (from the past 24 hour(s))  POCT Urinalysis Dipstick     Status: Abnormal   Collection Time: 03/07/19  2:31 PM  Result Value Ref Range   Color, UA yellow    Clarity, UA cloudy    Glucose, UA Negative Negative   Bilirubin, UA negative    Ketones, UA negative    Spec Grav, UA 1.015 1.010 - 1.025   Blood, UA 10    pH, UA 7.5 5.0 - 8.0   Protein, UA Positive (A) Negative   Urobilinogen, UA 0.2 0.2 or 1.0 E.U./dL   Nitrite, UA negative    Leukocytes, UA Large (3+) (A) Negative   Appearance     Odor      Assessment/Plan: Gabrielle [name] is a 86 y.o. female present for OV for  Urinary incontinence/abnormal  urine -Patient has had a chronic history of urinary incontinence since 2014 per EMR.  She feels it has been worsening over the last 6 months.  Seems to have some urgency features with also possible overflow features.  Uncertain of the surgical procedure she had 30 years ago, no records available from then and she does not recall the name of the urologist. -Discussed options with her today and agreed to rule out an infectious process that may be contributing to her worsening incontinence over the last few months.  If urine culture is positive will treat with antibiotic and see if she sees enough improvement in her symptoms.  Most likely will still need to refer to urology for further evaluation given her past urological surgical history mixed incontinence features and her smoking history.  Referral placed today. - Urine Culture - POCT Urinalysis Dipstick> trace blood positive leukocytes +3     Reviewed expectations re: course of current medical issues.  Discussed self-management of symptoms.  Outlined signs and symptoms indicating need for more acute intervention.  Patient verbalized understanding and all questions were answered.  Patient received an After-Visit Summary.    Orders Placed This Encounter  Procedures  . Urine Culture  . Ambulatory referral to Urology  . POCT Urinalysis Dipstick     Note is dictated utilizing voice recognition software. Although note has been proof read prior to signing, occasional typographical errors still can be missed. If any questions arise, please do not hesitate to call for verification.   electronically signed by:  Howard Pouch, DO  [place]

## 2019-03-08 ENCOUNTER — Ambulatory Visit: Payer: Medicare HMO | Admitting: Vascular Surgery

## 2019-03-09 LAB — URINE CULTURE
MICRO NUMBER:: 10015593
SPECIMEN QUALITY:: ADEQUATE

## 2019-03-12 ENCOUNTER — Telehealth: Payer: Self-pay | Admitting: Family Medicine

## 2019-03-12 DIAGNOSIS — N39498 Other specified urinary incontinence: Secondary | ICD-10-CM

## 2019-03-12 MED ORDER — LEVOFLOXACIN 250 MG PO TABS: 250.0000 mg | ORAL_TABLET | Freq: Every day | ORAL | 0 refills | Status: DC

## 2019-03-12 NOTE — Telephone Encounter (Signed)
Please inform patient the following information: (Please inform patient and her daughter on her contact list-patient has mild cognitive impairment.) Her urine did show evidence of a urinary tract infection.  I have called in an antibiotic to take once daily for 3 days. Her reported increase in urinary leakage over the last few months may be secondary to infection.  However, I have also referred to urology since her urinary leakage has gone on for a few years prior to recent worsening symptoms as well.  Medication called to [facility] on [address].

## 2019-03-12 NOTE — Telephone Encounter (Signed)
LMOM for patient's daughter, [name] to [facility] to discuss.

## 2019-03-13 NOTE — Telephone Encounter (Signed)
Pts daughter was called and given information. She verbalized understanding

## 2019-03-13 NOTE — Telephone Encounter (Signed)
Pts daughter LM on nurses line returning call. Daughter, [name] was called back with no answer. VM was left to return call.

## 2019-03-16 ENCOUNTER — Telehealth: Payer: Self-pay

## 2019-03-16 NOTE — Telephone Encounter (Signed)
Patient wants to know if [name] meant to only write 4 tablets for her bladder infection. Please call patient back. Patient was having trouble recalling her phone number so she gave me a phone number that her son wrote down for her.

## 2019-03-16 NOTE — Telephone Encounter (Signed)
Pts son, who lives local, was called and told patient is to only take one tablet daily for three days. He verbalized understanding

## 2019-03-29 ENCOUNTER — Ambulatory Visit: Payer: Medicare HMO | Admitting: Vascular Surgery

## 2019-04-04 ENCOUNTER — Telehealth (HOSPITAL_COMMUNITY): Payer: Self-pay

## 2019-04-04 NOTE — Telephone Encounter (Signed)

## 2019-04-05 ENCOUNTER — Other Ambulatory Visit: Payer: Self-pay

## 2019-04-05 ENCOUNTER — Encounter: Payer: Self-pay | Admitting: Vascular Surgery

## 2019-04-05 ENCOUNTER — Ambulatory Visit: Payer: Medicare HMO | Admitting: Vascular Surgery

## 2019-04-05 VITALS — BP 113/74 | HR 85 | Temp 97.9°F | Resp 20 | Ht 65.0 in | Wt 118.6 lb

## 2019-04-05 DIAGNOSIS — I739 Peripheral vascular disease, unspecified: Secondary | ICD-10-CM

## 2019-04-05 NOTE — Progress Notes (Signed)
Patient is an 86 year old female who returns for follow-up today.  She was last seen in November 2020.  At that time she was having bouts of recurrent cellulitis in her right leg.  She was noted to have peripheral arterial disease.  At that point the wound appeared to be healing so we made an attempt at smoking cessation with Chantix.  She returns today stating that she has not really had any further episodes of cellulitis.  However she did recently close her right leg in a car door and developed a laceration on the pretibial area.  Fortunately this has healed spontaneously.  The patient had to stop her Chantix secondary to diarrhea.  I had a very frank discussion with her today and she states that she is really not interested in quitting although the Chantix did make her desire for cigarettes slightly less.  She does not really describe any claudication symptoms.  She is minimally ambulatory.  She is on aspirin.  Review of systems: She gets short of breath with minimal exertion.  She does not have chest pain.  Current Outpatient Medications on File Prior to Visit  Medication Sig Dispense Refill  . anastrozole (ARIMIDEX) 1 MG tablet TAKE 1 TABLET IN THE MORNING. 30 tablet 2  . Cholecalciferol 2000 units CAPS Take 1 capsule (2,000 Units total) by mouth daily. 90 each 1  . cyanocobalamin 1000 MCG tablet Take 1,000 mcg by mouth daily.    Marland Kitchen donepezil (ARICEPT) 10 MG tablet Take 1 tablet (10 mg total) by mouth daily. 90 tablet 3  . mirtazapine (REMERON) 7.5 MG tablet Take 1 tablet (7.5 mg total) by mouth at bedtime. 90 tablet 1  . Multiple Vitamins-Minerals (PRESERVISION AREDS 2 PO) Take 1 tablet by mouth 2 (two) times daily.    . QC LO-DOSE ASPIRIN 81 MG EC tablet TAKE 1 TABLET IN THE MORNING. 30 tablet 5  . SPIRIVA RESPIMAT 2.5 MCG/ACT AERS INHALE 2 PUFFS INTO THE LUNGS DAILY. 4 g 0  . triamcinolone cream (KENALOG) 0.1 % Apply 1 application topically 2 (two) times daily. 80 g 2  . varenicline (CHANTIX)  0.5 MG tablet Take 1 tablet (0.5 mg total) by mouth 2 (two) times daily. Start this tablet after 2 weeks 30 tablet 5   No current facility-administered medications on file prior to visit.   Physical exam:  Vitals:   04/05/19 1322  BP: 113/74  Pulse: 85  Resp: 20  Temp: 97.9 F (36.6 C)  SpO2: 95%  Weight: 118 lb 9.6 oz (53.8 kg)  Height: 5\' 5"  (1.651 m)    Right lower extremity no palpable pedal pulses  Skin: 10 cm in length laceration along the right pretibial area essentially healed, no blisters ulcers on the right foot, foot is pink and warm  Data: I reviewed the patient's previous ABIs from November 2020 which was 0.7 on the right side.  She has a chronic right SFA occlusion from previous duplex scan.  Assessment: Peripheral arterial disease currently stable not really symptomatic fortunately wounds on the right leg have spontaneously healed no further episodes of cellulitis.  Plan: I again discussed with the patient today smoking cessation greater than 3 minutes.  At this point I am not sure she is willing to quit.  Fortunately she is mildly symptomatic from her peripheral arterial disease but I do not believe she needs an intervention at this point.   The patient will follow up in 6 months time in our APP clinic with repeat ABIs.  She will follow-up sooner if she develops new wounds.  Ruta Hinds, MD [facility] Office: [phone]

## 2019-04-06 ENCOUNTER — Other Ambulatory Visit: Payer: Self-pay | Admitting: *Deleted

## 2019-04-06 DIAGNOSIS — I739 Peripheral vascular disease, unspecified: Secondary | ICD-10-CM

## 2019-04-06 DIAGNOSIS — R350 Frequency of micturition: Secondary | ICD-10-CM | POA: Diagnosis not present

## 2019-04-06 DIAGNOSIS — N3941 Urge incontinence: Secondary | ICD-10-CM | POA: Diagnosis not present

## 2019-04-06 DIAGNOSIS — N3942 Incontinence without sensory awareness: Secondary | ICD-10-CM | POA: Diagnosis not present

## 2019-04-06 DIAGNOSIS — R351 Nocturia: Secondary | ICD-10-CM | POA: Diagnosis not present

## 2019-04-25 ENCOUNTER — Encounter: Payer: Self-pay | Admitting: Family Medicine

## 2019-04-25 DIAGNOSIS — H524 Presbyopia: Secondary | ICD-10-CM | POA: Diagnosis not present

## 2019-04-25 DIAGNOSIS — H35033 Hypertensive retinopathy, bilateral: Secondary | ICD-10-CM | POA: Diagnosis not present

## 2019-04-25 DIAGNOSIS — H35341 Macular cyst, hole, or pseudohole, right eye: Secondary | ICD-10-CM | POA: Diagnosis not present

## 2019-04-25 DIAGNOSIS — H35013 Changes in retinal vascular appearance, bilateral: Secondary | ICD-10-CM | POA: Diagnosis not present

## 2019-04-25 DIAGNOSIS — H35373 Puckering of macula, bilateral: Secondary | ICD-10-CM | POA: Diagnosis not present

## 2019-05-09 ENCOUNTER — Other Ambulatory Visit: Payer: Self-pay | Admitting: Oncology

## 2019-06-18 NOTE — Progress Notes (Signed)
[place]  [phone]) [phone] Fax:(336) [phone]     ID: Gabrielle [name] DOB: Feb 15, 1931  MR#: [id number]  ZHY#:[id number]  Patient Care Team: Ma Hillock, DO as PCP - General (Family Medicine) Excell Seltzer, MD (Inactive) as Consulting Physician (General Surgery) Tavaria Mackins, Virgie Dad, MD as Consulting Physician (Oncology) Arloa Koh, MD (Inactive) as Consulting Physician (Radiation Oncology) Jarome Matin, MD as Consulting Physician (Dermatology) Brand Males, MD as Consulting Physician (Pulmonary Disease) Alda Berthold, DO as Consulting Physician (Neurology) Armbruster, Carlota Raspberry, MD as Consulting Physician (Gastroenterology) OTHER MD: Joylene John, DDS  CHIEF COMPLAINT: Triple positive breast cancer (s/p right mastectomy)  CURRENT TREATMENT: Completing 5 years of anastrozole   INTERVAL HISTORY: Gabrielle [name] returns today for follow-up of her triple positive breast cancer accompanied by her daughter.   The patient continues on anastrozole, which she is tolerating well. She denies any issues with taking it such as hot flashes, vaginal dryness, or arthralgias.  She did have a urinary tract infection recently which was evaluated by Dr. Wendy Poet.  That problem, which included some leakage issues, has largely resolved.  Her most recent DEXA scan on 03/22/2017 showed a T-score of -3.1, which is considered osteoporotic.  She is scheduled for repeat DEXA scan today as well as a repeat left screening mammogram.  REVIEW OF SYSTEMS: Gabrielle [name] tells me she is bored.  She lives alone, with no pets.  She reads and watches TV.  She is not exercising regularly and is not going out much.  She recently had a cut in her lower right leg which however has healed nicely.  She does continue to smoke.  A detailed review of systems today was otherwise noncontributory   BREAST CANCER HISTORY: From the original intake note:  Gabrielle [name] had not been to a doctor for "more than 20  years. Since Christmas 2014 and the family has become increasingly concerned that she appeared to be losing weight and was a bit more confused. They asked the patient how much weight she had lost and she said she had lost "about 50 pounds.". However, the only weight we have available before August of this year is from August of 2014 and it was 115 pounds at that time.  Nevertheless with this concern the patient agreed to be seen by a physician and on 10/06/2013 she was seen by Dr. Kathlen Mody who obtained a full battery of tests. He found the patient to have a urinary tract infection with Escherichia coli, which may explain some of her confusion. Vitamin B 12 level was 215, which is in the low normal range. Hemoglobin was 14.7 with an MCV of 99.0. The patient was started on B12 supplementation parenterally and a chest x-ray was obtained 10/05/2013 which showed evidence of emphysema and prior granulomatous disease. To make sure an occult cancer was not being missed a CT scan of the chest abdomen and pelvis was obtained 11/19/2013. This showed a calcified granuloma in the inferior lingula. There was also a noncalcified 6 mm nodule in the lateral portion of the right lower lobe. There was no mediastinal mass or adenopathy. There were calcified granulomata in the spleen as well. There was significant atherosclerotic calcifications. There was sigmoid diverticulosis noted incidentally. In addition, a 1.8 cm right breast mass was noted.  The breast mass was evaluated further with bilateral diagnostic mammography and right breast ultrasonography of the breast Center 11/26/2013. The breast density was category C. There was indeed an irregular mass in the right breast which was  on palpation firm nontender and noted at the 10:30 position. Ultrasound confirmed a hypoechoic irregular mass measuring up to 3 cm. There was no right axillary adenopathy noted.  Biopsy of the right breast mass in question 11/26/2013 showed (SAA  [id number]) and invasive ductal carcinoma, grade 2, estrogen receptor 100% positive, progesterone receptor 59% positive, both with strong staining intensity, with an MIB-1 of 31%, and with HER-2 amplification, the signals ratio being 2.79 and the copy number per cell 5.45.  On 11-2013 the patient underwent bilateral breast MRI. This showed the enhancing mass in the upper-outer quadrant of the right breast to measure 2.1 cm. There was no other findings in the right breast, left breast, or either axilla.  The patient's subsequent history is as detailed below   PAST MEDICAL HISTORY: Past Medical History:  Diagnosis Date  . Anxiety   . Breast cancer of upper-outer quadrant of right female breast ([place]) 10/2013   Anastrazole since 12/2013--plan is to take this for 5 yrs.  No evidence of dz recurrence as of 11/2015 oncol f/u.  Marland Kitchen Cataract   . COPD (chronic obstructive pulmonary disease) ([place])   . Macular degeneration   . Osteoporosis 10/2014   T-score -2.8  . Pneumonia    hx of   . Retina hole   . Skin cancer   . Stroke ([place])   . Tobacco dependence     PAST SURGICAL HISTORY: Past Surgical History:  Procedure Laterality Date  . ABDOMINAL HYSTERECTOMY  1980   (ovaries intact)  . APPENDECTOMY    . [place]   Patient reports approximately 30 years ago having "bladder surgery "in which she describes a procedure that either stretched her bladder or her urethra dilated secondary to her not being able to void.  Marland Kitchen BREAST BIOPSY Right   . CATARACT EXTRACTION Bilateral   . MOHS SURGERY     Dr. Sarajane Jews  . PARS PLANA VITRECTOMY W/ REPAIR OF MACULAR HOLE Right   . RETINAL LASER PROCEDURE    . TOTAL MASTECTOMY Right 12/18/2013   Procedure: RIGHT TOTAL MASTECTOMY;  Surgeon: Excell Seltzer, MD;  Location: [facility];  Service: General;  Laterality: Right;    FAMILY HISTORY Family History  Problem Relation Age of Onset  . Heart attack Mother        Deceased  . Cancer Mother         fallopian tube cancer in 50s; deceased 13  . Throat cancer Father        Deceased 89; smoker  . Diabetes Brother   . Prostate cancer Brother 31       Currently 54  . Other Brother        Deceased, hemorhhage of pancreas  . Leukemia Maternal Aunt   . Breast cancer Paternal Aunt        age at diagnosis unknown  . Throat cancer Paternal Uncle        Deceased 25s; smoker  . Leukemia Paternal Grandmother    the patient's father died at the age of 61 from throat cancer which has been diagnosed to years before. The patient's mother was diagnosed at age 78 with fallopian tube carcinoma. She died at age 85. There is no other history of breast or ovarian cancer in the family to the patient's knowledge   GYNECOLOGIC HISTORY:  No LMP recorded. Patient has had a hysterectomy. Menarche age 61, first live birth age 62. The patient is GX P2. She underwent hysterectomy at a relatively young  age, without salpingo-oophorectomy. She did not take hormone replacement however she took birth control pills remotely for approximately 2 years with no complications.   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 She is divorced. Her son Gabrielle [name] lives in [place] and works as a Metallurgist. Daughter Gabrielle [name] lives in [place] where she works as a Architect for the [employer]. Incidentally Dr. [name] is a nephew of the patient    ADVANCED DIRECTIVES: In place; the patient's son Gabrielle [name] is her healthcare power of attorney. He can be reached at 336- [place]: Social History   Tobacco Use  . Smoking status: Current Every Day Smoker    Packs/day: 0.25    Years: 60.00    Pack years: 15.00    Types: Cigarettes  . Smokeless tobacco: Never Used  . Tobacco comment: 1/2 ppd 4.4.2019  Substance Use Topics  . Alcohol use: Yes    Alcohol/week: 2.0 standard drinks    Types: 2 Glasses of wine per week     Comment: occasional  . Drug use: No     Colonoscopy: Never  PAP:  Bone density: September 2016: Osteoporosis  Lipid panel:  No Known Allergies  Current Outpatient Medications  Medication Sig Dispense Refill  . anastrozole (ARIMIDEX) 1 MG tablet TAKE 1 TABLET IN THE MORNING. 90 tablet 0  . Cholecalciferol 2000 units CAPS Take 1 capsule (2,000 Units total) by mouth daily. 90 each 1  . cyanocobalamin 1000 MCG tablet Take 1,000 mcg by mouth daily.    Marland Kitchen donepezil (ARICEPT) 10 MG tablet Take 1 tablet (10 mg total) by mouth daily. 90 tablet 3  . mirtazapine (REMERON) 7.5 MG tablet Take 1 tablet (7.5 mg total) by mouth at bedtime. 90 tablet 1  . Multiple Vitamins-Minerals (PRESERVISION AREDS 2 PO) Take 1 tablet by mouth 2 (two) times daily.    . QC LO-DOSE ASPIRIN 81 MG EC tablet TAKE 1 TABLET IN THE MORNING. 30 tablet 5  . SPIRIVA RESPIMAT 2.5 MCG/ACT AERS INHALE 2 PUFFS INTO THE LUNGS DAILY. 4 g 0  . triamcinolone cream (KENALOG) 0.1 % Apply 1 application topically 2 (two) times daily. 80 g 2  . varenicline (CHANTIX) 0.5 MG tablet Take 1 tablet (0.5 mg total) by mouth 2 (two) times daily. Start this tablet after 2 weeks 30 tablet 5   No current facility-administered medications for this visit.    OBJECTIVE: white woman who appears stated age 57:   06/19/19 1423  BP: 135/70  Pulse: (!) 107  Resp: 18  Temp: 98.5 F (36.9 C)  SpO2: 98%     Body mass index is 19.15 kg/m.    ECOG FS:2 - Symptomatic, <50% confined to bed  Sclerae unicteric, EOMs intact Wearing a mask No cervical or supraclavicular adenopathy Lungs no rales or rhonchi Heart regular rate and rhythm Abd soft, nontender, positive bowel sounds MSK significant kyphosis but no focal spinal tenderness, lower extremity laceration has completely resolved Neuro: nonfocal, well oriented, appropriate affect Breasts: The right breast is status post mastectomy.  I did not palpate any evidence of recurrence today.  The left  breast was benign to exam.  Both axillae were benign.   LAB RESULTS:  CMP     Component Value Date/Time   NA 141 11/12/2018 1536   NA 143 12/13/2016 1259   K 4.5 11/12/2018 1536   K 4.1 12/13/2016 1259  CL 107 11/12/2018 1536   CO2 26 11/12/2018 1536   CO2 30 (H) 12/13/2016 1259   GLUCOSE 92 11/12/2018 1536   GLUCOSE 83 12/13/2016 1259   BUN 19 11/12/2018 1536   BUN 19.2 12/13/2016 1259   CREATININE 0.95 11/12/2018 1536   CREATININE 0.87 02/14/2018 1348   CREATININE 0.8 12/13/2016 1259   CALCIUM 9.0 11/12/2018 1536   CALCIUM 9.4 12/13/2016 1259   PROT 6.7 02/14/2018 1348   PROT 6.7 12/13/2016 1259   ALBUMIN 3.6 02/14/2018 1348   ALBUMIN 3.4 (L) 12/13/2016 1259   AST 17 02/14/2018 1348   AST 25 12/13/2016 1259   ALT 11 02/14/2018 1348   ALT 21 12/13/2016 1259   ALKPHOS 82 02/14/2018 1348   ALKPHOS 89 12/13/2016 1259   BILITOT 0.7 02/14/2018 1348   BILITOT 0.32 12/13/2016 1259   GFRNONAA 54 (L) 11/12/2018 1536   GFRNONAA >60 02/14/2018 1348   GFRAA >60 11/12/2018 1536   GFRAA >60 02/14/2018 1348    I No results found for: SPEP  Lab Results  Component Value Date   WBC 8.0 11/20/2018   NEUTROABS 5.5 11/20/2018   HGB 14.0 11/20/2018   HCT 42.5 11/20/2018   MCV 98.5 11/20/2018   PLT 284.0 11/20/2018      Chemistry      Component Value Date/Time   NA 141 11/12/2018 1536   NA 143 12/13/2016 1259   K 4.5 11/12/2018 1536   K 4.1 12/13/2016 1259   CL 107 11/12/2018 1536   CO2 26 11/12/2018 1536   CO2 30 (H) 12/13/2016 1259   BUN 19 11/12/2018 1536   BUN 19.2 12/13/2016 1259   CREATININE 0.95 11/12/2018 1536   CREATININE 0.87 02/14/2018 1348   CREATININE 0.8 12/13/2016 1259      Component Value Date/Time   CALCIUM 9.0 11/12/2018 1536   CALCIUM 9.4 12/13/2016 1259   ALKPHOS 82 02/14/2018 1348   ALKPHOS 89 12/13/2016 1259   AST 17 02/14/2018 1348   AST 25 12/13/2016 1259   ALT 11 02/14/2018 1348   ALT 21 12/13/2016 1259   BILITOT 0.7 02/14/2018 1348     BILITOT 0.32 12/13/2016 1259       No results found for: LABCA2  No components found for: LABCA125  No results for input(s): INR in the last 168 hours.  Urinalysis    Component Value Date/Time   COLORURINE YELLOW 10/05/2013 1158   APPEARANCEUR Cloudy (A) 10/05/2013 1158   LABSPEC 1.010 10/05/2013 1158   PHURINE 8.5 (A) 10/05/2013 1158   GLUCOSEU NEGATIVE 10/05/2013 1158   HGBUR NEGATIVE 10/05/2013 1158   BILIRUBINUR negative 03/07/2019 1431   KETONESUR NEGATIVE 10/05/2013 1158   PROTEINUR Positive (A) 03/07/2019 1431   UROBILINOGEN 0.2 03/07/2019 1431   UROBILINOGEN 0.2 10/05/2013 1158   NITRITE negative 03/07/2019 1431   NITRITE NEGATIVE 10/05/2013 1158   LEUKOCYTESUR Large (3+) (A) 03/07/2019 1431    STUDIES: No results found.   ASSESSMENT: 86 y.o. BRCA negative [place] woman status post right breast upper outer quadrant biopsy 11/26/2013 for a clinical T2 N0, stage IIA invasive ductal carcinoma, grade 2, with micropapillary features, triple positive, with an MIB-1 of 31%  (1) right simple mastectomy 12/18/2013 showed a pT2 pNX, stage II invasive ductal carcinoma, grade 2, with negative margins.  (2) 6 mm right lower lobe nodule noted on CT scans 11/19/2013  (a) continuing tobacco abuse-  (b) emphysema  (c) chest CT 11/24/2016 shows no evidence of malignancy  (d) chest CT scan  June 01, 2017 again showed emphysema but otherwise improvement in multiple pulmonary opacities.  (3) progressive weight loss: stabilized  (4) started anastrozole 01/03/2014  (a) denosumab/Prolia discussed with the patient 12/13/2016  (b) osteoporosis- Bone density 11/25/2014 showed a T score of -2.8  (c) repeat bone density 03/22/2017 shows a T score of -3.1  (5) started trastuzumab 02/05/2014; completed one year, last dose 02/27/2015  (a) echo  02/27/2015 shows an EF of 60-65%   (6) tobacco abuse disorder: the patient has been strongly advised to discontinue  smoking   PLAN: Gabrielle [name] is now 5-1/2 years out from definitive surgery for her breast cancer with no evidence of disease recurrence.  This is very favorable.  She has the option of discontinuing anastrozole since she has obtained the major portion of the risk reduction by taking it over 5 years.  Continuing an additional 2 years might add perhaps 1 or 2% to the general risk reduction and she would prefer to stop.  She will have mammography and a bone density again today and follow-up of the osteoporosis will be through her primary care physician  At this point I am comfortable releasing her to her primary care physician.  All Gabrielle [name] will need in terms of breast cancer follow-up is a yearly breast and chest wall physician exam and yearly left-sided screening mammography  I will be glad to see her again at any point in the future if and when the need arises but as of now are making no further routine appointments for her here  Total encounter time 25 minutes.Sarajane Jews C. Giavonni Cizek, MD 06/19/19 3:05 PM Medical Oncology and Hematology [facility] [place], [place] [address] Tel. [phone]    Fax. [phone]   I, Wilburn Mylar, am acting as scribe for Dr. Virgie Dad. Brinklee Cisse.  I, Lurline Del MD, have reviewed the above documentation for accuracy and completeness, and I agree with the above.    *Total Encounter Time as defined by the Centers for Medicare and Medicaid Services includes, in addition to the face-to-face time of a patient visit (documented in the note above) non-face-to-face time: obtaining and reviewing outside history, ordering and reviewing medications, tests or procedures, care coordination (communications with other health care professionals or caregivers) and documentation in the medical record.

## 2019-06-19 ENCOUNTER — Inpatient Hospital Stay: Payer: Medicare HMO | Attending: Oncology | Admitting: Oncology

## 2019-06-19 ENCOUNTER — Ambulatory Visit
Admission: RE | Admit: 2019-06-19 | Discharge: 2019-06-19 | Disposition: A | Discharge status: Home / Self Care | Payer: Medicare HMO | Source: Ambulatory Visit | Attending: Adult Health | Admitting: Adult Health

## 2019-06-19 ENCOUNTER — Ambulatory Visit: Payer: Medicare HMO

## 2019-06-19 ENCOUNTER — Other Ambulatory Visit: Payer: Self-pay

## 2019-06-19 VITALS — BP 135/70 | HR 107 | Temp 98.5°F | Resp 18 | Ht 65.0 in | Wt 115.1 lb

## 2019-06-19 DIAGNOSIS — E2839 Other primary ovarian failure: Secondary | ICD-10-CM

## 2019-06-19 DIAGNOSIS — Z803 Family history of malignant neoplasm of breast: Secondary | ICD-10-CM | POA: Diagnosis not present

## 2019-06-19 DIAGNOSIS — C50411 Malignant neoplasm of upper-outer quadrant of right female breast: Secondary | ICD-10-CM | POA: Insufficient documentation

## 2019-06-19 DIAGNOSIS — Z8673 Personal history of transient ischemic attack (TIA), and cerebral infarction without residual deficits: Secondary | ICD-10-CM | POA: Insufficient documentation

## 2019-06-19 DIAGNOSIS — Z8049 Family history of malignant neoplasm of other genital organs: Secondary | ICD-10-CM | POA: Diagnosis not present

## 2019-06-19 DIAGNOSIS — M818 Other osteoporosis without current pathological fracture: Secondary | ICD-10-CM

## 2019-06-19 DIAGNOSIS — R911 Solitary pulmonary nodule: Secondary | ICD-10-CM

## 2019-06-19 DIAGNOSIS — Z801 Family history of malignant neoplasm of trachea, bronchus and lung: Secondary | ICD-10-CM | POA: Insufficient documentation

## 2019-06-19 DIAGNOSIS — Z85828 Personal history of other malignant neoplasm of skin: Secondary | ICD-10-CM | POA: Diagnosis not present

## 2019-06-19 DIAGNOSIS — Z8249 Family history of ischemic heart disease and other diseases of the circulatory system: Secondary | ICD-10-CM | POA: Insufficient documentation

## 2019-06-19 DIAGNOSIS — Z8042 Family history of malignant neoplasm of prostate: Secondary | ICD-10-CM | POA: Insufficient documentation

## 2019-06-19 DIAGNOSIS — Z806 Family history of leukemia: Secondary | ICD-10-CM | POA: Diagnosis not present

## 2019-06-19 DIAGNOSIS — F419 Anxiety disorder, unspecified: Secondary | ICD-10-CM | POA: Insufficient documentation

## 2019-06-19 DIAGNOSIS — Z780 Asymptomatic menopausal state: Secondary | ICD-10-CM | POA: Diagnosis not present

## 2019-06-19 DIAGNOSIS — M810 Age-related osteoporosis without current pathological fracture: Secondary | ICD-10-CM | POA: Diagnosis not present

## 2019-06-19 DIAGNOSIS — Z170 Estrogen receptor positive status [ER+]: Secondary | ICD-10-CM | POA: Insufficient documentation

## 2019-06-19 DIAGNOSIS — Z7982 Long term (current) use of aspirin: Secondary | ICD-10-CM | POA: Insufficient documentation

## 2019-06-19 DIAGNOSIS — Z79811 Long term (current) use of aromatase inhibitors: Secondary | ICD-10-CM | POA: Insufficient documentation

## 2019-06-19 DIAGNOSIS — Z833 Family history of diabetes mellitus: Secondary | ICD-10-CM | POA: Diagnosis not present

## 2019-06-19 DIAGNOSIS — Z79899 Other long term (current) drug therapy: Secondary | ICD-10-CM | POA: Insufficient documentation

## 2019-06-19 DIAGNOSIS — J449 Chronic obstructive pulmonary disease, unspecified: Secondary | ICD-10-CM | POA: Diagnosis not present

## 2019-06-19 DIAGNOSIS — M8588 Other specified disorders of bone density and structure, other site: Secondary | ICD-10-CM | POA: Diagnosis not present

## 2019-06-19 DIAGNOSIS — Z1231 Encounter for screening mammogram for malignant neoplasm of breast: Secondary | ICD-10-CM | POA: Diagnosis not present

## 2019-06-19 DIAGNOSIS — Z9011 Acquired absence of right breast and nipple: Secondary | ICD-10-CM | POA: Insufficient documentation

## 2019-06-19 DIAGNOSIS — F17210 Nicotine dependence, cigarettes, uncomplicated: Secondary | ICD-10-CM | POA: Diagnosis not present

## 2019-06-20 ENCOUNTER — Telehealth: Payer: Self-pay | Admitting: Hematology and Oncology

## 2019-06-20 NOTE — Telephone Encounter (Signed)
No changes made to pt's schedule per 4/20 los.

## 2019-07-30 ENCOUNTER — Other Ambulatory Visit: Payer: Self-pay | Admitting: Oncology

## 2019-07-31 ENCOUNTER — Other Ambulatory Visit: Payer: Self-pay | Admitting: *Deleted

## 2020-01-18 ENCOUNTER — Ambulatory Visit: Payer: Medicare HMO | Admitting: Neurology

## 2020-01-18 ENCOUNTER — Encounter: Payer: Self-pay | Admitting: Neurology

## 2020-01-18 ENCOUNTER — Other Ambulatory Visit: Payer: Self-pay

## 2020-01-18 DIAGNOSIS — F423 Hoarding disorder: Secondary | ICD-10-CM

## 2020-01-18 DIAGNOSIS — F01518 Vascular dementia, unspecified severity, with other behavioral disturbance: Secondary | ICD-10-CM

## 2020-01-18 DIAGNOSIS — F0151 Vascular dementia with behavioral disturbance: Secondary | ICD-10-CM

## 2020-01-18 MED ORDER — DONEPEZIL HCL 10 MG PO TABS: 10.0000 mg | ORAL_TABLET | Freq: Every day | ORAL | 3 refills | Status: DC

## 2020-01-18 NOTE — Patient Instructions (Addendum)
Strongly encouraged to clean your home to optimize personal and home hygeine  Return to clinic in 1 year

## 2020-01-18 NOTE — Progress Notes (Signed)
Follow-up Visit   Date: 01/18/20    JALAYLA CHRISMER MRN: [id number] DOB: 1930-04-01   Interim History: ALETHA ALLEBACH is a 86 y.o. right-handed Caucasian female with history of tobacco use, breast cancer s/p mastectomy and chemotherapy returning to the clinic for follow-up of vascular dementia.  The patient was accompanied to the clinic by son, Lanny Hurst [facility]), who also provides collateral information.    Over the past year, there continues to be slow deterioration of independent functioning. She is more forgetful and misplaces objects frequently.  Despite me telling patient she should not drive, she continues to drive locally and has been lost twice.  Her son was able to locate her on "find my friends" phone app and called police who brought her back home.  Her home continues to be in disarray due to hoarding. She refuses to allow her family to help her clean and organize and is unwilling to part with anything.  Family attempted to get a caregiver at home, but the company refused due to home being unsafe for their employees. Her son manages mail and finances. She has medications prepared and mailed to her directly.    Medications:  Current Outpatient Medications on File Prior to Visit  Medication Sig Dispense Refill  . anastrozole (ARIMIDEX) 1 MG tablet TAKE 1 TABLET IN THE MORNING. 90 tablet 0  . Cholecalciferol 2000 units CAPS Take 1 capsule (2,000 Units total) by mouth daily. 90 each 1  . cyanocobalamin 1000 MCG tablet Take 1,000 mcg by mouth daily.    Marland Kitchen donepezil (ARICEPT) 10 MG tablet Take 1 tablet (10 mg total) by mouth daily. 90 tablet 3  . Multiple Vitamins-Minerals (PRESERVISION AREDS 2 PO) Take 1 tablet by mouth 2 (two) times daily.    . QC LO-DOSE ASPIRIN 81 MG EC tablet TAKE 1 TABLET IN THE MORNING. 30 tablet 5  . triamcinolone cream (KENALOG) 0.1 % Apply 1 application topically 2 (two) times daily. 80 g 2  . mirtazapine (REMERON) 7.5 MG tablet Take 1 tablet (7.5 mg total) by  mouth at bedtime. 90 tablet 1  . SPIRIVA RESPIMAT 2.5 MCG/ACT AERS INHALE 2 PUFFS INTO THE LUNGS DAILY. 4 g 0  . varenicline (CHANTIX) 0.5 MG tablet Take 1 tablet (0.5 mg total) by mouth 2 (two) times daily. Start this tablet after 2 weeks 30 tablet 5   No current facility-administered medications on file prior to visit.    Allergies: No Known Allergies   Vital Signs:  BP 108/73   Pulse 79   Resp 20   Ht '5\' 4"'  (1.626 m)   Wt 111 lb (50.3 kg)   SpO2 99%   BMI 19.05 kg/m   Neurological Exam:  MENTAL STATUS:  Awake, oriented, answers questions, poor judgement. Poor eye contact.   MOTOR:  Motor strength is 5/5 in all extremities.     COORDINATION/GAIT:    Stooped posture, gait narrow based and stable.   Data: Labs 11/26/2015:  RPR NR, vitamin E 1.0, TSH 1.6, vitamin B1 13, vitamin B12 >1500, ESR 13  MRI brain 01/22/2014: No acute infarct. Remote medial right thalamic infarct which was partially hemorrhagic with presence of blood breakdown products. Remote tiny inferior cerebellar infarct. Mild to moderate small vessel disease type changes. No intracranial mass or bony destructive lesion to suggest the presence of intracranial metastatic disease. Global atrophy without hydrocephalus.  Right vertebral artery and right posterior inferior cerebellar artery may be occluded.  MRI brain wwo contrast 12/17/2015: 1.  No acute intracranial abnormality. 2. Advanced chronic small vessel disease, with progression in both thalami since 2015. 3. Chronic occlusion of the distal right vertebral artery again suspected.  Neuropsychiatry testing at [facility] January 2016:  Neuropsychology testing showed evidence of vascular dementia (mild) Neuropsychology testing August 2017:  Anxiety disorder, cognitive disorder deferred   IMPRESSION: Vascular dementia with behavior changes, the latter seems to be chronic issues related with obsessive compulsive issues and hoarding.  I discussed at length  about home safety and maintaining a healthy environment at home.  She does not have insight into this issue and there does not seem to be any motivation to follow the recommendations made. This is difficult situation, most of which is psychosocial issues, more than neurological.  Again, I stressed the importance of not driving Refilled aricept 70m daily  Return to clinic in 1 year  Total time spent reviewing records, interview, history/exam, documentation, counseling and coordination of care on day of encounter:  30 min   Thank you for allowing me to participate in patient's care.  If I can answer any additional questions, I would be pleased to do so.    Sincerely,    Jeffery Bachmeier K. PPosey Pronto DO

## 2020-02-20 ENCOUNTER — Ambulatory Visit (INDEPENDENT_AMBULATORY_CARE_PROVIDER_SITE_OTHER): Payer: Medicare HMO

## 2020-02-20 ENCOUNTER — Encounter: Payer: Self-pay | Admitting: Family Medicine

## 2020-02-20 ENCOUNTER — Other Ambulatory Visit: Payer: Self-pay

## 2020-02-20 DIAGNOSIS — Z23 Encounter for immunization: Secondary | ICD-10-CM | POA: Diagnosis not present

## 2020-03-10 ENCOUNTER — Other Ambulatory Visit: Payer: Self-pay

## 2020-03-11 ENCOUNTER — Encounter: Payer: Self-pay | Admitting: Family Medicine

## 2020-03-11 ENCOUNTER — Ambulatory Visit (INDEPENDENT_AMBULATORY_CARE_PROVIDER_SITE_OTHER): Payer: Medicare HMO | Admitting: Family Medicine

## 2020-03-11 VITALS — BP 117/82 | HR 88 | Temp 98.1°F | Ht 63.5 in | Wt 107.0 lb

## 2020-03-11 DIAGNOSIS — R32 Unspecified urinary incontinence: Secondary | ICD-10-CM | POA: Insufficient documentation

## 2020-03-11 DIAGNOSIS — F0150 Vascular dementia without behavioral disturbance: Secondary | ICD-10-CM | POA: Diagnosis not present

## 2020-03-11 DIAGNOSIS — H9193 Unspecified hearing loss, bilateral: Secondary | ICD-10-CM

## 2020-03-11 DIAGNOSIS — Z79899 Other long term (current) drug therapy: Secondary | ICD-10-CM

## 2020-03-11 DIAGNOSIS — R1011 Right upper quadrant pain: Secondary | ICD-10-CM | POA: Insufficient documentation

## 2020-03-11 DIAGNOSIS — Z1159 Encounter for screening for other viral diseases: Secondary | ICD-10-CM | POA: Diagnosis not present

## 2020-03-11 DIAGNOSIS — Z130 Encounter for screening for diseases of the blood and blood-forming organs and certain disorders involving the immune mechanism: Secondary | ICD-10-CM

## 2020-03-11 DIAGNOSIS — Z85828 Personal history of other malignant neoplasm of skin: Secondary | ICD-10-CM | POA: Insufficient documentation

## 2020-03-11 DIAGNOSIS — N39498 Other specified urinary incontinence: Secondary | ICD-10-CM

## 2020-03-11 DIAGNOSIS — Z0001 Encounter for general adult medical examination with abnormal findings: Secondary | ICD-10-CM | POA: Diagnosis not present

## 2020-03-11 DIAGNOSIS — R634 Abnormal weight loss: Secondary | ICD-10-CM

## 2020-03-11 DIAGNOSIS — R1031 Right lower quadrant pain: Secondary | ICD-10-CM | POA: Insufficient documentation

## 2020-03-11 DIAGNOSIS — Z170 Estrogen receptor positive status [ER+]: Secondary | ICD-10-CM

## 2020-03-11 DIAGNOSIS — Z131 Encounter for screening for diabetes mellitus: Secondary | ICD-10-CM | POA: Diagnosis not present

## 2020-03-11 DIAGNOSIS — M818 Other osteoporosis without current pathological fracture: Secondary | ICD-10-CM | POA: Diagnosis not present

## 2020-03-11 DIAGNOSIS — C50411 Malignant neoplasm of upper-outer quadrant of right female breast: Secondary | ICD-10-CM | POA: Diagnosis not present

## 2020-03-11 DIAGNOSIS — I739 Peripheral vascular disease, unspecified: Secondary | ICD-10-CM

## 2020-03-11 DIAGNOSIS — L989 Disorder of the skin and subcutaneous tissue, unspecified: Secondary | ICD-10-CM | POA: Insufficient documentation

## 2020-03-11 DIAGNOSIS — J449 Chronic obstructive pulmonary disease, unspecified: Secondary | ICD-10-CM

## 2020-03-11 DIAGNOSIS — R64 Cachexia: Secondary | ICD-10-CM | POA: Diagnosis not present

## 2020-03-11 DIAGNOSIS — L97512 Non-pressure chronic ulcer of other part of right foot with fat layer exposed: Secondary | ICD-10-CM | POA: Diagnosis not present

## 2020-03-11 DIAGNOSIS — Z5320 Procedure and treatment not carried out because of patient's decision for unspecified reasons: Secondary | ICD-10-CM

## 2020-03-11 NOTE — Progress Notes (Signed)
This visit occurred during the SARS-CoV-2 public health emergency.  Safety protocols were in place, including screening questions prior to the visit, additional usage of staff PPE, and extensive cleaning of exam room while observing appropriate contact time as indicated for disinfecting solutions.    Patient ID: Gabrielle [name], female  DOB: 1930-08-08, 87 y.o.   MRN: [id number] Patient Care Team    Relationship Specialty Notifications Start End  Gabrielle Hillock, DO PCP - General Family Medicine  08/01/15   Gabrielle Seltzer, MD (Inactive) Consulting Physician General Surgery  11/29/13   Magrinat, Virgie Dad, MD Consulting Physician Oncology  11/29/13   Gabrielle Koh, MD (Inactive) Consulting Physician Radiation Oncology  11/29/13   Gabrielle Matin, MD Consulting Physician Dermatology  06/11/15   Gabrielle Males, MD Consulting Physician Pulmonary Disease  06/11/15   Gabrielle Berthold, DO Consulting Physician Neurology  08/01/15   Armbruster, Gabrielle Raspberry, MD Consulting Physician Gastroenterology  12/06/16   Gabrielle Loser, MD Consulting Physician Urology  06/19/19     Chief Complaint  Patient presents with  . Annual Exam    Pt is not fasting;    Subjective: Gabrielle [name] is a 87 y.o.  Female  present for CPE . All past medical history, surgical history, allergies, family history, immunizations, medications and social history were updated in the electronic medical record today. All recent labs, ED visits and hospitalizations within the last year were reviewed.  Health maintenance:  Colonoscopy: > 75- n/a. Mammogram: > 85 n/a Immunizations: tdap UTD 2018, Influenza UTD 2021(encouraged yearly), PNA series completed, covid series and booster completed, shingrix completed Infectious disease screening: Hep C - pt agreeable to testing today DEXA: last completed 05/2019,-3.4 osteoporosis.  Assistive device: none Oxygen [id number] Patient has a Dental home. Hospitalizations/ED visits:  reviewed   Depression screen [facility] 2/9 03/11/2020 08/15/2017 06/10/2017 12/06/2016 10/29/2016  Decreased Interest 3 0 0 0 0  Down, Depressed, Hopeless 1 0 0 1 3  PHQ - 2 Score 4 0 0 1 3  Altered sleeping 0 - - - 0  Tired, decreased energy 3 - - - -  Change in appetite 0 - - - 3  Feeling bad or failure about yourself  0 - - - 3  Trouble concentrating 3 - - - 0  Moving slowly or fidgety/restless 0 - - - 0  Suicidal thoughts 0 - - - 0  PHQ-9 Score 10 - - - 3  Difficult doing work/chores - - - - Not difficult at all  Some recent data might be hidden   GAD 7 : Generalized Anxiety Score 03/11/2020  Nervous, Anxious, on Edge 0  Control/stop worrying 0  Worry too much - different things 0  Trouble relaxing 0  Restless 0  Easily annoyed or irritable 0  Afraid - awful might happen 1  Total GAD 7 Score 1           Immunization History  Administered Date(s) Administered  . Fluad Quad(high Dose 65+) 12/01/2018, 02/20/2020  . Influenza, High Dose Seasonal PF 11/29/2016, 12/27/2017  . Influenza,inj,Quad PF,6+ Mos 11/21/2013, 12/19/2014  . PFIZER SARS-COV-2 Vaccination 04/07/2019, 04/28/2019, 12/06/2019  . PPD Test 09/20/2016  . Pneumococcal Conjugate-13 11/21/2013  . Pneumococcal Polysaccharide-23 11/29/2016  . Td 12/21/2016  . Zoster Recombinat (Shingrix) 03/18/2017, 06/22/2017     Past Medical History:  Diagnosis Date  . Anxiety   . Breast cancer ([place]) 2015   right  . Breast cancer of upper-outer quadrant of right female breast ([place])  10/2013   Anastrazole since 12/2013--plan is to take this for 5 yrs.  No evidence of dz recurrence as of 11/2015 oncol f/u.  Marland Kitchen Cataract   . COPD (chronic obstructive pulmonary disease) ([place])   . Lung nodule, solitary 11/21/2013   6 mm lung nodule R lung seen on chest CT.  R breast mass. Disgnostic MMG pending.   . Macular degeneration   . Osteoporosis 10/2014   T-score -2.8  . Pneumonia    hx of   . Retina hole   . Skin cancer   . Stroke ([place])    . Tobacco dependence    No Known Allergies Past Surgical History:  Procedure Laterality Date  . ABDOMINAL HYSTERECTOMY  1980   (ovaries intact)  . APPENDECTOMY    . [place]   Patient reports approximately 30 years ago having "bladder surgery "in which she describes a procedure that either stretched her bladder or her urethra dilated secondary to her not being able to void.  Marland Kitchen BREAST BIOPSY Right   . CATARACT EXTRACTION Bilateral   . MASTECTOMY Right 2015  . MOHS SURGERY     Gabrielle [name]  . PARS PLANA VITRECTOMY W/ REPAIR OF MACULAR HOLE Right   . RETINAL LASER PROCEDURE    . TOTAL MASTECTOMY Right 12/18/2013   Procedure: RIGHT TOTAL MASTECTOMY;  Surgeon: Gabrielle Seltzer, MD;  Location: [facility];  Service: General;  Laterality: Right;   Family History  Problem Relation Age of Onset  . Heart attack Mother        Deceased  . Cancer Mother        fallopian tube cancer in 20s; deceased 73  . Throat cancer Father        Deceased 20; smoker  . Diabetes Brother   . Prostate cancer Brother 11       Currently 57  . Other Brother        Deceased, hemorhhage of pancreas  . Leukemia Maternal Aunt   . Breast cancer Paternal Aunt        age at diagnosis unknown  . Throat cancer Paternal Uncle        Deceased 75s; smoker  . Leukemia Paternal Grandmother    Social History   Social History Narrative   Gabrielle [name] lives alone in a [place] home. She has two grown children.   She continues to work part-time for [employer].    She has a grown son-lives in [place] in [place]. Gabrielle [name] is her nephew.   Right handed   One story home    Allergies as of 03/11/2020   No Known Allergies     Medication List       Accurate as of March 11, 2020  4:05 PM. If you have any questions, ask your nurse or doctor.        STOP taking these medications   anastrozole 1 MG tablet Commonly known as: ARIMIDEX Stopped by: Gabrielle Pouch, DO   mirtazapine 7.5 MG  tablet Commonly known as: REMERON Stopped by: Gabrielle Pouch, DO   Spiriva Respimat 2.5 MCG/ACT Aers Generic drug: Tiotropium Bromide Monohydrate Stopped by: Gabrielle Pouch, DO   triamcinolone 0.1 % Commonly known as: KENALOG Stopped by: Gabrielle Pouch, DO   varenicline 0.5 MG tablet Commonly known as: Chantix Stopped by: Gabrielle Pouch, DO     TAKE these medications   Cholecalciferol 50 MCG (2000 UT) Caps Take 1 capsule (2,000 Units total) by mouth daily.  cyanocobalamin 1000 MCG tablet Take 1,000 mcg by mouth daily.   donepezil 10 MG tablet Commonly known as: ARICEPT Take 1 tablet (10 mg total) by mouth daily.   PRESERVISION AREDS 2 PO Take 1 tablet by mouth 2 (two) times daily.   QC LO-DOSE ASPIRIN 81 MG EC tablet Generic drug: aspirin TAKE 1 TABLET IN THE MORNING.       All past medical history, surgical history, allergies, family history, immunizations andmedications were updated in the EMR today and reviewed under the history and medication portions of their EMR.     No results found for this or any previous visit (from the past 2160 hour(s)).    MM 3D SCREEN BREAST UNI LEFT Result Date: 06/19/2019  RECOMMENDATION: Screening mammogram in one year. (Code:SM-B-01Y) BI-RADS CATEGORY  1: Negative. Electronically Signed   By: Everlean Alstrom M.D.   On: 06/19/2019 16:01    ROS: 14 pt review of systems performed and negative (unless mentioned in an HPI)  Objective: BP 117/82   Pulse 88   Temp 98.1 F (36.7 C) (Oral)   Ht 5' 3.5" (1.613 m)   Wt 107 lb (48.5 kg)   SpO2 98%   BMI 18.66 kg/m  Gen: Afebrile. No acute distress. Nontoxic in appearance, well-developed, well-nourished,  Thin female.  HENT: AT. [place]. Bilateral TM visualized and normal in appearance, normal external auditory canal. MMM, no oral lesions, adequate dentition. Bilateral nares within normal limits. Throat without erythema, ulcerations or exudates. no Cough on exam, no hoarseness on  exam. Eyes:Pupils Equal Round Reactive to light, Extraocular movements intact,  Conjunctiva without redness, discharge or icterus. Neck/lymp/endocrine: Supple,no lymphadenopathy, no thyromegaly CV: RRR no mumrur, no edema, +2/4 P posterior tibialis pulses.  Chest: CTAB, no wheeze, rhonchi or crackles. normal Respiratory effort. good Air movement. Abd: Soft.  Flat.  Mild right upper quadrant tenderness and right lower quadrant tenderness on exam.  ND. BS present.  No masses palpated. No hepatosplenomegaly. No rebound tenderness or guarding. Skin: No rashes, purpura or petechiae. Warm and well-perfused. Skin intact. Neuro/Msk: Normal gait. PERLA. EOMi. Alert. Oriented x3.  Psych: Normal affect, dress and demeanor. Normal speech. Normal thought content and judgment     Hearing Screening   125Hz  250Hz  500Hz  1000Hz  2000Hz  3000Hz  4000Hz  6000Hz  8000Hz   Right ear:   40        Left ear:   40 40         Assessment/plan: YAMIKA CIFARELLI is a 87 y.o. female present for CPE/acute/cmc PAD (peripheral artery disease) ([place]) Continue baby aspirin - Lipid panel  osteoporosis without current pathological fracture DEXA up-to-date 2021 Patient is supplementing with 2000 units of vitamin D daily - TSH - Vitamin D (25 hydroxy)  Vascular dementia without behavioral disturbance ([place]) Established with neurology which prescribes her Aricept Continue daily baby aspirin  Screening for deficiency anemia - CBC with Differential/Platelet  Encounter for long-term current use of medication - Comprehensive metabolic panel  Diabetes mellitus screening - Hemoglobin A1c  Need for hepatitis C screening test - Hepatitis C Antibody  Right upper quadrant abdominal pain/Abdominal pain, RLQ CMP -Abdominal ultrasound ordered for right upper quadrant and left lower quadrant pain on exam today.  Patient had reported right upper quadrant pain during her physical that have been occurring for a few weeks.  She has  never had a colonoscopy.  Decreased hearing of both ears -They are interested in hearing aids if possible - Ambulatory referral to Audiology  Malignant neoplasm of upper-outer  quadrant of right breast in female, estrogen receptor positive ([place]) - MM 3D SCREEN BREAST UNI LEFT; Future No longer needing to follow with oncology.  Follow-up yearly with mammogram.  Encourage self breast exams.  Skin lesion/History of skin cancer - Ambulatory referral to Dermatology  Weight loss/Cachexia ([place]) TSH and CMP collected today.  COPD, moderate ([place]) Followed by pulmonology  Urinary incontinence Patient was encouraged to empty bladder every few hours.  She reports this only occurs about once a week however she does not want to wear a pad every day and she currently is not interested in further evaluation.  Encounter for general adult medical examination with abnormal findings Patient was encouraged to exercise greater than 150 minutes a week. Patient was encouraged to choose a diet filled with fresh fruits and vegetables, and lean meats. AVS provided to patient today for education/recommendation on gender specific health and safety maintenance. Colonoscopy: > 75- n/a. Mammogram: > 85 n/a Immunizations: tdap UTD 2018, Influenza UTD 2021(encouraged yearly), PNA series completed, covid series and booster completed, shingrix completed Infectious disease screening: Hep C - pt agreeable to testing today DEXA: last completed 05/2019,-3.4 osteoporosis.   Return in about 1 year (around 03/11/2021) for CPE (30 min).  Orders Placed This Encounter  Procedures  . MM 3D SCREEN BREAST UNI LEFT  . CBC with Differential/Platelet  . Comprehensive metabolic panel  . Hemoglobin A1c  . Lipid panel  . TSH  . Vitamin D (25 hydroxy)  . Hepatitis C Antibody  . Ambulatory referral to Audiology  . Ambulatory referral to Dermatology   No orders of the defined types were placed in this encounter.   Referral  Orders     Ambulatory referral to Audiology     Ambulatory referral to Dermatology   Electronically signed by: Gabrielle Pouch, DO [place]

## 2020-03-11 NOTE — Patient Instructions (Signed)
Health Maintenance After Age 85 After age 85, you are at a higher risk for certain long-term diseases and infections as well as injuries from falls. Falls are a major cause of broken bones and head injuries in people who are older than age 85. Getting regular preventive care can help to keep you healthy and well. Preventive care includes getting regular testing and making lifestyle changes as recommended by your health care provider. Talk with your health care provider about:  Which screenings and tests you should have. A screening is a test that checks for a disease when you have no symptoms.  A diet and exercise plan that is right for you. What should I know about screenings and tests to prevent falls? Screening and testing are the best ways to find a health problem early. Early diagnosis and treatment give you the best chance of managing medical conditions that are common after age 85. Certain conditions and lifestyle choices may make you more likely to have a fall. Your health care provider may recommend:  Regular vision checks. Poor vision and conditions such as cataracts can make you more likely to have a fall. If you wear glasses, make sure to get your prescription updated if your vision changes.  Medicine review. Work with your health care provider to regularly review all of the medicines you are taking, including over-the-counter medicines. Ask your health care provider about any side effects that may make you more likely to have a fall. Tell your health care provider if any medicines that you take make you feel dizzy or sleepy.  Osteoporosis screening. Osteoporosis is a condition that causes the bones to get weaker. This can make the bones weak and cause them to break more easily.  Blood pressure screening. Blood pressure changes and medicines to control blood pressure can make you feel dizzy.  Strength and balance checks. Your health care provider may recommend certain tests to check your  strength and balance while standing, walking, or changing positions.  Foot health exam. Foot pain and numbness, as well as not wearing proper footwear, can make you more likely to have a fall.  Depression screening. You may be more likely to have a fall if you have a fear of falling, feel emotionally low, or feel unable to do activities that you used to do.  Alcohol use screening. Using too much alcohol can affect your balance and may make you more likely to have a fall. What actions can I take to lower my risk of falls? General instructions  Talk with your health care provider about your risks for falling. Tell your health care provider if: ? You fall. Be sure to tell your health care provider about all falls, even ones that seem minor. ? You feel dizzy, sleepy, or off-balance.  Take over-the-counter and prescription medicines only as told by your health care provider. These include any supplements.  Eat a healthy diet and maintain a healthy weight. A healthy diet includes low-fat dairy products, low-fat (lean) meats, and fiber from whole grains, beans, and lots of fruits and vegetables. Home safety  Remove any tripping hazards, such as rugs, cords, and clutter.  Install safety equipment such as grab bars in bathrooms and safety rails on stairs.  Keep rooms and walkways well-lit. Activity  Follow a regular exercise program to stay fit. This will help you maintain your balance. Ask your health care provider what types of exercise are appropriate for you.  If you need a cane or walker,   use it as recommended by your health care provider.  Wear supportive shoes that have nonskid soles.   Lifestyle  Do not drink alcohol if your health care provider tells you not to drink.  If you drink alcohol, limit how much you have: ? 0-1 drink a day for women. ? 0-2 drinks a day for men.  Be aware of how much alcohol is in your drink. In the [place] one drink equals one typical bottle of beer (12  oz), one-half glass of wine (5 oz), or one shot of hard liquor (1 oz).  Do not use any products that contain nicotine or tobacco, such as cigarettes and e-cigarettes. If you need help quitting, ask your health care provider. Summary  Having a healthy lifestyle and getting preventive care can help to protect your health and wellness after age 85.  Screening and testing are the best way to find a health problem early and help you avoid having a fall. Early diagnosis and treatment give you the best chance for managing medical conditions that are more common for people who are older than age 85.  Falls are a major cause of broken bones and head injuries in people who are older than age 85. Take precautions to prevent a fall at home.  Work with your health care provider to learn what changes you can make to improve your health and wellness and to prevent falls. This information is not intended to replace advice given to you by your health care provider. Make sure you discuss any questions you have with your health care provider. Document Revised: 06/08/2018 Document Reviewed: 12/29/2016 Elsevier Patient Education  2021 Elsevier Inc.  

## 2020-03-12 DIAGNOSIS — L821 Other seborrheic keratosis: Secondary | ICD-10-CM | POA: Diagnosis not present

## 2020-03-12 DIAGNOSIS — D0439 Carcinoma in situ of skin of other parts of face: Secondary | ICD-10-CM | POA: Diagnosis not present

## 2020-03-12 DIAGNOSIS — D485 Neoplasm of uncertain behavior of skin: Secondary | ICD-10-CM | POA: Diagnosis not present

## 2020-03-12 DIAGNOSIS — Z85828 Personal history of other malignant neoplasm of skin: Secondary | ICD-10-CM | POA: Diagnosis not present

## 2020-03-12 LAB — CBC WITH DIFFERENTIAL/PLATELET
Absolute Monocytes: 679 cells/uL (ref 200–950)
Basophils Absolute: 40 cells/uL (ref 0–200)
Basophils Relative: 0.5 %
Eosinophils Absolute: 47 cells/uL (ref 15–500)
Eosinophils Relative: 0.6 %
HCT: 43 % (ref 35.0–45.0)
Hemoglobin: 14.5 g/dL (ref 11.7–15.5)
Lymphs Abs: 1335 cells/uL (ref 850–3900)
MCH: 32.2 pg (ref 27.0–33.0)
MCHC: 33.7 g/dL (ref 32.0–36.0)
MCV: 95.6 fL (ref 80.0–100.0)
MPV: 12.1 fL (ref 7.5–12.5)
Monocytes Relative: 8.6 %
Neutro Abs: 5799 cells/uL (ref 1500–7800)
Neutrophils Relative %: 73.4 %
Platelets: 298 10*3/uL (ref 140–400)
RBC: 4.5 10*6/uL (ref 3.80–5.10)
RDW: 12.2 % (ref 11.0–15.0)
Total Lymphocyte: 16.9 %
WBC: 7.9 10*3/uL (ref 3.8–10.8)

## 2020-03-12 LAB — COMPREHENSIVE METABOLIC PANEL
AG Ratio: 1.3 (calc) (ref 1.0–2.5)
ALT: 13 U/L (ref 6–29)
AST: 20 U/L (ref 10–35)
Albumin: 3.8 g/dL (ref 3.6–5.1)
Alkaline phosphatase (APISO): 85 U/L (ref 37–153)
BUN/Creatinine Ratio: 32 (calc) — ABNORMAL HIGH (ref 6–22)
BUN: 30 mg/dL — ABNORMAL HIGH (ref 7–25)
CO2: 27 mmol/L (ref 20–32)
Calcium: 9.6 mg/dL (ref 8.6–10.4)
Chloride: 102 mmol/L (ref 98–110)
Creat: 0.95 mg/dL — ABNORMAL HIGH (ref 0.60–0.88)
Globulin: 2.9 g/dL (calc) (ref 1.9–3.7)
Glucose, Bld: 89 mg/dL (ref 65–99)
Potassium: 4.7 mmol/L (ref 3.5–5.3)
Sodium: 139 mmol/L (ref 135–146)
Total Bilirubin: 0.4 mg/dL (ref 0.2–1.2)
Total Protein: 6.7 g/dL (ref 6.1–8.1)

## 2020-03-12 LAB — TSH: TSH: 1.64 mIU/L (ref 0.40–4.50)

## 2020-03-12 LAB — LIPID PANEL
Cholesterol: 181 mg/dL (ref <200)
HDL: 44 mg/dL — ABNORMAL LOW (ref >=50)
LDL Cholesterol (Calc): 119 mg/dL (calc) — ABNORMAL HIGH
Non-HDL Cholesterol (Calc): 137 mg/dL (calc) — ABNORMAL HIGH (ref <130)
Total CHOL/HDL Ratio: 4.1 (calc) (ref <5.0)
Triglycerides: 83 mg/dL (ref <150)

## 2020-03-12 LAB — HEMOGLOBIN A1C
Hgb A1c MFr Bld: 5.8 % of total Hgb — ABNORMAL HIGH (ref <5.7)
Mean Plasma Glucose: 120 mg/dL
eAG (mmol/L): 6.6 mmol/L

## 2020-03-12 LAB — HEPATITIS C ANTIBODY
Hepatitis C Ab: NONREACTIVE
SIGNAL TO CUT-OFF: 0.01 (ref <1.00)

## 2020-03-12 LAB — VITAMIN D 25 HYDROXY (VIT D DEFICIENCY, FRACTURES): Vit D, 25-Hydroxy: 54 ng/mL (ref 30–100)

## 2020-03-14 ENCOUNTER — Encounter: Payer: Self-pay | Admitting: Family Medicine

## 2020-03-14 DIAGNOSIS — Z5320 Procedure and treatment not carried out because of patient's decision for unspecified reasons: Secondary | ICD-10-CM | POA: Insufficient documentation

## 2020-03-20 ENCOUNTER — Other Ambulatory Visit: Payer: Self-pay

## 2020-03-20 ENCOUNTER — Ambulatory Visit (INDEPENDENT_AMBULATORY_CARE_PROVIDER_SITE_OTHER): Payer: Medicare HMO

## 2020-03-20 DIAGNOSIS — R1011 Right upper quadrant pain: Secondary | ICD-10-CM

## 2020-03-20 DIAGNOSIS — R1031 Right lower quadrant pain: Secondary | ICD-10-CM

## 2020-04-02 DIAGNOSIS — H903 Sensorineural hearing loss, bilateral: Secondary | ICD-10-CM | POA: Diagnosis not present

## 2020-04-02 DIAGNOSIS — Z822 Family history of deafness and hearing loss: Secondary | ICD-10-CM | POA: Diagnosis not present

## 2020-07-04 ENCOUNTER — Encounter: Payer: Self-pay | Admitting: Family Medicine

## 2020-07-04 ENCOUNTER — Ambulatory Visit (INDEPENDENT_AMBULATORY_CARE_PROVIDER_SITE_OTHER): Payer: Medicare PPO | Admitting: Family Medicine

## 2020-07-04 ENCOUNTER — Other Ambulatory Visit: Payer: Self-pay

## 2020-07-04 ENCOUNTER — Telehealth: Payer: Self-pay | Admitting: Family Medicine

## 2020-07-04 VITALS — BP 106/71 | HR 67 | Temp 98.6°F | Ht 63.5 in | Wt 107.0 lb

## 2020-07-04 DIAGNOSIS — R8290 Unspecified abnormal findings in urine: Secondary | ICD-10-CM

## 2020-07-04 DIAGNOSIS — R443 Hallucinations, unspecified: Secondary | ICD-10-CM | POA: Diagnosis not present

## 2020-07-04 LAB — POCT URINALYSIS DIPSTICK
Bilirubin, UA: NEGATIVE
Blood, UA: NEGATIVE
Glucose, UA: NEGATIVE
Ketones, UA: POSITIVE
Nitrite, UA: POSITIVE
Protein, UA: POSITIVE — AB
Spec Grav, UA: 1.02 (ref 1.010–1.025)
Urobilinogen, UA: 1 E.U./dL
pH, UA: 6 (ref 5.0–8.0)

## 2020-07-04 MED ORDER — LEVOFLOXACIN 250 MG PO TABS: 250.0000 mg | ORAL_TABLET | Freq: Every day | ORAL | 0 refills | Status: DC

## 2020-07-04 NOTE — Telephone Encounter (Signed)
Correction on prior note. Pt is not to be driving.

## 2020-07-04 NOTE — Telephone Encounter (Signed)
Patients daughter [name] [phone]) is calling to see if patient can be seen for possible UTI. States that patient was on the way to dinner the other night and started hallucinating. Please call her back and advise.

## 2020-07-04 NOTE — Telephone Encounter (Signed)
Spoke with daugther who informed me of the hallucinations. Informed her that we will only be able to cover th UTI today and if she would like that she will need to have her schedule an separate appt for the alteration in mental status. Daughter voiced understanding. Verified with daughter if pt be driving herself to the appt and informed her that per [name] pt is to be driving. Daughter stated that there is not much she can do since she lives so far but will ask her brother if he can take her. Informed her that if she is hallucinating while driving that can become a distraction from the road.

## 2020-07-04 NOTE — Progress Notes (Signed)
This visit occurred during the SARS-CoV-2 public health emergency.  Safety protocols were in place, including screening questions prior to the visit, additional usage of staff PPE, and extensive cleaning of exam room while observing appropriate contact time as indicated for disinfecting solutions.    Gabrielle [name] , [birth year], 87 y.o., female MRN: [id number] Patient Care Team    Relationship Specialty Notifications Start End  Ma Hillock, DO PCP - General Family Medicine  08/01/15   Excell Seltzer, MD (Inactive) Consulting Physician General Surgery  11/29/13   Magrinat, Virgie Dad, MD Consulting Physician Oncology  11/29/13   Arloa Koh, MD (Inactive) Consulting Physician Radiation Oncology  11/29/13   Jarome Matin, MD Consulting Physician Dermatology  06/11/15   Brand Males, MD Consulting Physician Pulmonary Disease  06/11/15   Alda Berthold, DO Consulting Physician Neurology  08/01/15   Armbruster, Carlota Raspberry, MD Consulting Physician Gastroenterology  12/06/16   Bjorn Loser, MD Consulting Physician Urology  06/19/19     Chief Complaint  Patient presents with  . Dysuria     Subjective: Pt presents for an OV with complaints of confusion/hallucinations that occurred 2-3 days ago.  She denies any fever, chills, nausea, vomiting, abdominal pressure, dysuria or urinary frequency.  She is not hydrating very well.  She only drinks tea throughout the day.  Her urine is dark in color with odor.  She had an episode of seeing a four-legged animal on a building roof x1 when driving with her son.  This has been a family concern she is possibly got a UTI.  There has been no medication changes or over-the-counter supplements.  Depression screen [facility] 2/9 03/11/2020 08/15/2017 06/10/2017 12/06/2016 10/29/2016  Decreased Interest 3 0 0 0 0  Down, Depressed, Hopeless 1 0 0 1 3  PHQ - 2 Score 4 0 0 1 3  Altered sleeping 0 - - - 0  Tired, decreased energy 3 - - - -  Change in appetite 0 - - - 3   Feeling bad or failure about yourself  0 - - - 3  Trouble concentrating 3 - - - 0  Moving slowly or fidgety/restless 0 - - - 0  Suicidal thoughts 0 - - - 0  PHQ-9 Score 10 - - - 3  Difficult doing work/chores - - - - Not difficult at all  Some recent data might be hidden    No Known Allergies Social History   Social History Narrative   Ms. Dunavan lives alone in a [place] home. She has two grown children.   She continues to work part-time for [employer].    She has a grown son-lives in [place] in [place]. [name] is her nephew.   Right handed   One story home   Past Medical History:  Diagnosis Date  . Anxiety   . Breast cancer ([place]) 2015   right  . Breast cancer of upper-outer quadrant of right female breast ([place]) 10/2013   Anastrazole since 12/2013--plan is to take this for 5 yrs.  No evidence of dz recurrence as of 11/2015 oncol f/u.  Marland Kitchen Cataract   . COPD (chronic obstructive pulmonary disease) ([place])   . Lung nodule, solitary 11/21/2013   6 mm lung nodule R lung seen on chest CT.  R breast mass. Disgnostic MMG pending.   . Macular degeneration   . Osteoporosis 10/2014   T-score -2.8  . Pneumonia    hx of   .  Retina hole   . Skin cancer   . Stroke ([place])   . Tobacco dependence    Past Surgical History:  Procedure Laterality Date  . ABDOMINAL HYSTERECTOMY  1980   (ovaries intact)  . APPENDECTOMY    . [place]   Patient reports approximately 30 years ago having "bladder surgery "in which she describes a procedure that either stretched her bladder or her urethra dilated secondary to her not being able to void.  Marland Kitchen BREAST BIOPSY Right   . CATARACT EXTRACTION Bilateral   . MASTECTOMY Right 2015  . MOHS SURGERY     Dr. Sarajane Jews  . PARS PLANA VITRECTOMY W/ REPAIR OF MACULAR HOLE Right   . RETINAL LASER PROCEDURE    . TOTAL MASTECTOMY Right 12/18/2013   Procedure: RIGHT TOTAL MASTECTOMY;  Surgeon: Excell Seltzer, MD;  Location: [facility]  ORS;  Service: General;  Laterality: Right;   Family History  Problem Relation Age of Onset  . Heart attack Mother        Deceased  . Cancer Mother        fallopian tube cancer in 14s; deceased 34  . Throat cancer Father        Deceased 30; smoker  . Diabetes Brother   . Prostate cancer Brother 18       Currently 34  . Other Brother        Deceased, hemorhhage of pancreas  . Leukemia Maternal Aunt   . Breast cancer Paternal Aunt        age at diagnosis unknown  . Throat cancer Paternal Uncle        Deceased 69s; smoker  . Leukemia Paternal Grandmother    Allergies as of 07/04/2020   No Known Allergies     Medication List       Accurate as of Jul 04, 2020  4:45 PM. If you have any questions, ask your nurse or doctor.        Cholecalciferol 50 MCG (2000 UT) Caps Take 1 capsule (2,000 Units total) by mouth daily.   cyanocobalamin 1000 MCG tablet Take 1,000 mcg by mouth daily.   donepezil 10 MG tablet Commonly known as: ARICEPT Take 1 tablet (10 mg total) by mouth daily.   levofloxacin 250 MG tablet Commonly known as: LEVAQUIN Take 1 tablet (250 mg total) by mouth daily. Started by: Howard Pouch, DO   PRESERVISION AREDS 2 PO Take 1 tablet by mouth 2 (two) times daily.   QC LO-DOSE ASPIRIN 81 MG EC tablet Generic drug: aspirin TAKE 1 TABLET IN THE MORNING.       All past medical history, surgical history, allergies, family history, immunizations andmedications were updated in the EMR today and reviewed under the history and medication portions of their EMR.     ROS: Negative, with the exception of above mentioned in HPI   Objective:  BP 106/71   Pulse 67   Temp 98.6 F (37 C) (Oral)   Ht 5' 3.5" (1.613 m)   Wt 107 lb (48.5 kg)   SpO2 96%   BMI 18.66 kg/m  Body mass index is 18.66 kg/m. Gen: Afebrile. No acute distress. Nontoxic in appearance, well developed, well nourished.  HENT: AT. [place].  Eyes:Pupils Equal Round Reactive to light, Extraocular  movements intact,  Conjunctiva without redness, discharge or icterus. CV: RRR  Chest: CTAB  Abd: Soft.. NTND. BS present.  No masses palpated. No rebound or guarding.  MSK: no cva TTP Neuro: Normal gait.  PERLA. EOMi. Alert. Oriented x3  Psych: Normal affect, dress and demeanor. Normal speech. Normal thought content and judgment.  No exam data present No results found. Results for orders placed or performed in visit on 07/04/20 (from the past 24 hour(s))  POCT urinalysis dipstick     Status: Abnormal   Collection Time: 07/04/20  4:16 PM  Result Value Ref Range   Color, UA yellow    Clarity, UA cloudy    Glucose, UA Negative Negative   Bilirubin, UA negative    Ketones, UA positive    Spec Grav, UA 1.020 1.010 - 1.025   Blood, UA negative    pH, UA 6.0 5.0 - 8.0   Protein, UA Positive (A) Negative   Urobilinogen, UA 1.0 0.2 or 1.0 E.U./dL   Nitrite, UA positive    Leukocytes, UA Large (3+) (A) Negative   Appearance     Odor      Assessment/Plan: PECOLIA MARANDO is a 87 y.o. female present for OV for  Hallucination/Abnormal urine odor She had difficulty producing her urine today.  She likely has some mild dehydration.  Encouraged her to drink water or low sugar electrolyte replacement use of tea daily. Start Levaquin 250 mg daily x5 days.  Attempted to make regimen as simple as possible for her and cover potential resistant bacteria she had on her last UTI 03/2019. - POCT urinalysis dipstick> + nitrites and leuks - Urinalysis with Culture Reflex Follow-up dependent upon lab results and clinical outcome.    Reviewed expectations re: course of current medical issues.  Discussed self-management of symptoms.  Outlined signs and symptoms indicating need for more acute intervention.  Patient verbalized understanding and all questions were answered.  Patient received an After-Visit Summary.    Orders Placed This Encounter  Procedures  . Urinalysis with Culture Reflex  .  POCT urinalysis dipstick   Meds ordered this encounter  Medications  . levofloxacin (LEVAQUIN) 250 MG tablet    Sig: Take 1 tablet (250 mg total) by mouth daily.    Dispense:  5 tablet    Refill:  0   Referral Orders  No referral(s) requested today     Note is dictated utilizing voice recognition software. Although note has been proof read prior to signing, occasional typographical errors still can be missed. If any questions arise, please do not hesitate to call for verification.   electronically signed by:  Howard Pouch, DO  [place]

## 2020-07-07 ENCOUNTER — Telehealth: Payer: Self-pay | Admitting: Family Medicine

## 2020-07-07 LAB — URINALYSIS W MICROSCOPIC + REFLEX CULTURE
Bilirubin Urine: NEGATIVE
Glucose, UA: NEGATIVE
Hgb urine dipstick: NEGATIVE
Hyaline Cast: NONE SEEN /LPF
Ketones, ur: NEGATIVE
Nitrites, Initial: POSITIVE — AB
Specific Gravity, Urine: 1.012 (ref 1.001–1.035)
Squamous Epithelial / HPF: NONE SEEN /HPF (ref <=5)
pH: 6 (ref 5.0–8.0)

## 2020-07-07 LAB — URINE CULTURE
MICRO NUMBER:: 11862375
SPECIMEN QUALITY:: ADEQUATE

## 2020-07-07 LAB — CULTURE INDICATED

## 2020-07-07 MED ORDER — CEFDINIR 300 MG PO CAPS: 300.0000 mg | ORAL_CAPSULE | Freq: Two times a day (BID) | ORAL | 0 refills | Status: DC

## 2020-07-07 NOTE — Telephone Encounter (Signed)
Please call patient's family (patient has memory issues): Her urine culture did show evidence of urinary tract infection.  Unfortunately, this is a different bacteria than caused her last infection and we will need to change her antibiotic.  I have called in [place] which is taken every 12 hours for 7 days.  Would encourage them to start this immediately.  Follow-up in 1-2 weeks if symptoms still remain.

## 2020-07-07 NOTE — Telephone Encounter (Signed)
Notified son per [facility]

## 2020-07-22 ENCOUNTER — Other Ambulatory Visit: Payer: Self-pay | Admitting: Family Medicine

## 2020-09-08 DIAGNOSIS — Z85828 Personal history of other malignant neoplasm of skin: Secondary | ICD-10-CM | POA: Diagnosis not present

## 2020-09-08 DIAGNOSIS — L821 Other seborrheic keratosis: Secondary | ICD-10-CM | POA: Diagnosis not present

## 2020-09-17 ENCOUNTER — Other Ambulatory Visit: Payer: Self-pay | Admitting: Family Medicine

## 2020-09-17 ENCOUNTER — Other Ambulatory Visit: Payer: Self-pay

## 2020-09-17 ENCOUNTER — Ambulatory Visit
Admission: RE | Admit: 2020-09-17 | Discharge: 2020-09-17 | Disposition: A | Discharge status: Home / Self Care | Payer: Medicare PPO | Source: Ambulatory Visit | Attending: Family Medicine | Admitting: Family Medicine

## 2020-09-17 ENCOUNTER — Telehealth: Payer: Self-pay

## 2020-09-17 DIAGNOSIS — Z170 Estrogen receptor positive status [ER+]: Secondary | ICD-10-CM

## 2020-09-17 DIAGNOSIS — C50411 Malignant neoplasm of upper-outer quadrant of right female breast: Secondary | ICD-10-CM

## 2020-09-17 DIAGNOSIS — N649 Disorder of breast, unspecified: Secondary | ICD-10-CM

## 2020-09-17 NOTE — Telephone Encounter (Signed)
Spoke with pt son regarding medical alert and instructions.

## 2020-09-17 NOTE — Telephone Encounter (Signed)
Unfortunately, I do not have much experience in the different types of medical alert systems now available.

## 2020-09-17 NOTE — Telephone Encounter (Signed)
Pt's daughter called asking if we had any recommendations as to what type of medical alert would be best for pt. Daughter is concerned that pt may not keep it on/charged. Daughter does not live in town and is concerned about something happening to pt.

## 2020-09-23 ENCOUNTER — Emergency Department (HOSPITAL_BASED_OUTPATIENT_CLINIC_OR_DEPARTMENT_OTHER): Payer: Medicare PPO

## 2020-09-23 ENCOUNTER — Encounter (HOSPITAL_BASED_OUTPATIENT_CLINIC_OR_DEPARTMENT_OTHER): Payer: Self-pay | Admitting: *Deleted

## 2020-09-23 ENCOUNTER — Emergency Department (HOSPITAL_BASED_OUTPATIENT_CLINIC_OR_DEPARTMENT_OTHER)
Admission: EM | Admit: 2020-09-23 | Discharge: 2020-09-23 | Disposition: A | Discharge status: Home / Self Care | Payer: Medicare PPO | Attending: Emergency Medicine | Admitting: Emergency Medicine

## 2020-09-23 ENCOUNTER — Other Ambulatory Visit: Payer: Self-pay

## 2020-09-23 DIAGNOSIS — Z7982 Long term (current) use of aspirin: Secondary | ICD-10-CM | POA: Diagnosis not present

## 2020-09-23 DIAGNOSIS — F0150 Vascular dementia without behavioral disturbance: Secondary | ICD-10-CM | POA: Insufficient documentation

## 2020-09-23 DIAGNOSIS — F17210 Nicotine dependence, cigarettes, uncomplicated: Secondary | ICD-10-CM | POA: Diagnosis not present

## 2020-09-23 DIAGNOSIS — Y92009 Unspecified place in unspecified non-institutional (private) residence as the place of occurrence of the external cause: Secondary | ICD-10-CM | POA: Diagnosis not present

## 2020-09-23 DIAGNOSIS — S0990XA Unspecified injury of head, initial encounter: Secondary | ICD-10-CM | POA: Diagnosis not present

## 2020-09-23 DIAGNOSIS — Z85828 Personal history of other malignant neoplasm of skin: Secondary | ICD-10-CM | POA: Insufficient documentation

## 2020-09-23 DIAGNOSIS — W010XXA Fall on same level from slipping, tripping and stumbling without subsequent striking against object, initial encounter: Secondary | ICD-10-CM | POA: Insufficient documentation

## 2020-09-23 DIAGNOSIS — S51811A Laceration without foreign body of right forearm, initial encounter: Secondary | ICD-10-CM | POA: Insufficient documentation

## 2020-09-23 DIAGNOSIS — J449 Chronic obstructive pulmonary disease, unspecified: Secondary | ICD-10-CM | POA: Diagnosis not present

## 2020-09-23 DIAGNOSIS — N3001 Acute cystitis with hematuria: Secondary | ICD-10-CM | POA: Diagnosis not present

## 2020-09-23 DIAGNOSIS — S59911A Unspecified injury of right forearm, initial encounter: Secondary | ICD-10-CM | POA: Diagnosis present

## 2020-09-23 DIAGNOSIS — Z853 Personal history of malignant neoplasm of breast: Secondary | ICD-10-CM | POA: Diagnosis not present

## 2020-09-23 DIAGNOSIS — W19XXXA Unspecified fall, initial encounter: Secondary | ICD-10-CM

## 2020-09-23 DIAGNOSIS — S199XXA Unspecified injury of neck, initial encounter: Secondary | ICD-10-CM | POA: Diagnosis not present

## 2020-09-23 DIAGNOSIS — M47812 Spondylosis without myelopathy or radiculopathy, cervical region: Secondary | ICD-10-CM | POA: Diagnosis not present

## 2020-09-23 LAB — CBC WITH DIFFERENTIAL/PLATELET
Abs Immature Granulocytes: 0.05 10*3/uL (ref 0.00–0.07)
Basophils Absolute: 0 10*3/uL (ref 0.0–0.1)
Basophils Relative: 0 %
Eosinophils Absolute: 0 10*3/uL (ref 0.0–0.5)
Eosinophils Relative: 0 %
HCT: 43.1 % (ref 36.0–46.0)
Hemoglobin: 14.8 g/dL (ref 12.0–15.0)
Immature Granulocytes: 1 %
Lymphocytes Relative: 13 %
Lymphs Abs: 1.2 10*3/uL (ref 0.7–4.0)
MCH: 32.7 pg (ref 26.0–34.0)
MCHC: 34.3 g/dL (ref 30.0–36.0)
MCV: 95.4 fL (ref 80.0–100.0)
Monocytes Absolute: 0.5 10*3/uL (ref 0.1–1.0)
Monocytes Relative: 5 %
Neutro Abs: 7.5 10*3/uL (ref 1.7–7.7)
Neutrophils Relative %: 81 %
Platelets: 274 10*3/uL (ref 150–400)
RBC: 4.52 MIL/uL (ref 3.87–5.11)
RDW: 13.2 % (ref 11.5–15.5)
WBC: 9.3 10*3/uL (ref 4.0–10.5)
nRBC: 0 % (ref 0.0–0.2)

## 2020-09-23 LAB — URINALYSIS, ROUTINE W REFLEX MICROSCOPIC
Bilirubin Urine: NEGATIVE
Glucose, UA: NEGATIVE mg/dL
Ketones, ur: NEGATIVE mg/dL
Nitrite: POSITIVE — AB
Protein, ur: NEGATIVE mg/dL
Specific Gravity, Urine: 1.015 (ref 1.005–1.030)
pH: 6 (ref 5.0–8.0)

## 2020-09-23 LAB — BASIC METABOLIC PANEL
Anion gap: 10 (ref 5–15)
BUN: 25 mg/dL — ABNORMAL HIGH (ref 8–23)
CO2: 27 mmol/L (ref 22–32)
Calcium: 9 mg/dL (ref 8.9–10.3)
Chloride: 103 mmol/L (ref 98–111)
Creatinine, Ser: 0.95 mg/dL (ref 0.44–1.00)
GFR, Estimated: 58 mL/min — ABNORMAL LOW (ref >60)
Glucose, Bld: 98 mg/dL (ref 70–99)
Potassium: 4 mmol/L (ref 3.5–5.1)
Sodium: 140 mmol/L (ref 135–145)

## 2020-09-23 LAB — CK: Total CK: 552 U/L — ABNORMAL HIGH (ref 38–234)

## 2020-09-23 LAB — URINALYSIS, MICROSCOPIC (REFLEX): WBC, UA: >50 WBC/hpf (ref 0–5)

## 2020-09-23 MED ORDER — CEPHALEXIN 250 MG PO CAPS
500.0000 mg | ORAL_CAPSULE | Freq: Once | ORAL | Status: AC
  Administered 2020-09-23: 500 mg via ORAL
  Filled 2020-09-23: qty 2

## 2020-09-23 MED ORDER — CEPHALEXIN 500 MG PO CAPS: 500.0000 mg | ORAL_CAPSULE | Freq: Two times a day (BID) | ORAL | 0 refills | Status: AC
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
## 2020-09-23 NOTE — ED Notes (Signed)
Pt provided with water so she can obtain urine sample
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
## 2020-09-23 NOTE — Discharge Instructions (Addendum)
Take antibiotics as prescribed and complete the full course.  Recheck with your primary care doctor in 1 to 2 days. Leave dressing in place on right arm.  Your primary care provider can recheck this.

## 2020-09-25 ENCOUNTER — Other Ambulatory Visit: Payer: Self-pay

## 2020-09-25 ENCOUNTER — Encounter: Payer: Self-pay | Admitting: Family Medicine

## 2020-09-25 ENCOUNTER — Ambulatory Visit: Payer: Medicare PPO | Admitting: Family Medicine

## 2020-09-25 VITALS — BP 98/62 | HR 85 | Temp 98.5°F | Ht 64.0 in | Wt 105.0 lb

## 2020-09-25 DIAGNOSIS — F0150 Vascular dementia without behavioral disturbance: Secondary | ICD-10-CM | POA: Diagnosis not present

## 2020-09-25 DIAGNOSIS — W19XXXA Unspecified fall, initial encounter: Secondary | ICD-10-CM | POA: Diagnosis not present

## 2020-09-25 DIAGNOSIS — Z681 Body mass index (BMI) 19 or less, adult: Secondary | ICD-10-CM | POA: Diagnosis not present

## 2020-09-25 DIAGNOSIS — T148XXA Other injury of unspecified body region, initial encounter: Secondary | ICD-10-CM | POA: Diagnosis not present

## 2020-09-25 DIAGNOSIS — J449 Chronic obstructive pulmonary disease, unspecified: Secondary | ICD-10-CM

## 2020-09-25 DIAGNOSIS — D692 Other nonthrombocytopenic purpura: Secondary | ICD-10-CM | POA: Diagnosis not present

## 2020-09-25 DIAGNOSIS — E440 Moderate protein-calorie malnutrition: Secondary | ICD-10-CM | POA: Diagnosis not present

## 2020-09-25 DIAGNOSIS — R531 Weakness: Secondary | ICD-10-CM | POA: Diagnosis not present

## 2020-09-25 DIAGNOSIS — R64 Cachexia: Secondary | ICD-10-CM | POA: Diagnosis not present

## 2020-09-25 NOTE — Patient Instructions (Signed)
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
## 2020-09-26 ENCOUNTER — Telehealth: Payer: Self-pay

## 2020-09-26 ENCOUNTER — Other Ambulatory Visit: Payer: Self-pay

## 2020-09-26 NOTE — Telephone Encounter (Signed)
   Telephone encounter was:  Successful.  09/26/2020 Name: Gabrielle [name] MRN: [id number] DOB: [birth year]  Gabrielle [name] is a 87 y.o. year old female who is a primary care patient of Raoul Pitch, [place], DO . The community resource team was consulted for assistance with Transportation Needs  and medical alert system  Care guide performed the following interventions: Spoke with patient's son Gabrielle [name] confirmed email address [email or web address] and emailed information for medical alert system and transportation as requested. Letter saved in Epic.  Follow Up Plan:  Care guide will follow up with patient by phone over the next 7 days  Tarryn Bogdan, AAS Paralegal, [place] Management  [address], [place] [address] ??[name]  ?? [id number]   [email or web address]

## 2020-09-26 NOTE — Patient Instructions (Addendum)
Goals Addressed             This Visit's Progress    Find Help in My Community       Barriers: Knowledge Timeframe:  Short-Term Goal Priority:  High Start Date:  09/26/20                           Expected End Date:      11/28/20                Follow Up Date 10/29/20  - call 211 when I need some help - follow-up on any referrals for help I am given - have a back-up plan    Why is this important?   Knowing how and where to find help for yourself or family in your neighborhood and community is an important skill.  You will want to take some steps to learn how.    Notes: 09/26/20 Son looking to have patient placed in assisted living facility. Son and daughter to have conversation with patient over the weekend.  Care guides to call for resources. Advised to use a place for mom website to begin search of facility.       Manage Fatigue (Tiredness-COPD)       Barriers: Health Behaviors Knowledge Timeframe:  Long-Range Goal Priority:  Medium Start Date:       09/26/20                      Expected End Date:   02/28/21                    Follow Up Date 10/29/20    - eat healthy - practice relaxation or meditation daily    Why is this important?   Feeling tired or worn out is a common symptom of COPD (chronic obstructive pulmonary disease).  Learning when you feel your best and when you need rest is important.  Managing the tiredness (fatigue) will help you be active and enjoy life.     Notes:09/26/20 Pace self with activity.   Avoid day time heating Take frequent rest breaks

## 2020-09-26 NOTE — Patient Outreach (Addendum)
[facility]) Care Management  09/26/2020  Gabrielle [name] 04-15-1930 [id number]   Referral Date: 09/26/20 Referral Source: MD Referral Referral Reason: Resources   Outreach Attempt: No answer.  HIPAA compliant voice message left.    1:35 pm Incoming call from son [name].  Discussed reason for referral.  He and his sister feel it is time to place patient in assisted living.  Patient had recent fall and could not get up by herself. Patient lives alone and home is quite cluttered per [place]. Patient also still drives and has gotten lost before.  Patient still smokes and he is not sure if she takes her medication regularly.  He also questions her hygiene practices.  He and his sister are afraid at this point that something may happen to their mom being that she lives alone and her dementia is worse.    Patient with history of COPD and vascular dementia.  Dementia has worsened.  Patient does not use inhalers for COPD but gets winded easily per [name].   Discussed [facility] services and support. Also discussed care guide outreach for resources with placement. Son is agreeable to [facility] outreach and support to assist with placement as needed.  Advised son to look at website-a place for mom to help with assisted living facilities.   Current Outpatient Medications on File Prior to Visit  Medication Sig Dispense Refill   cephALEXin (KEFLEX) 500 MG capsule Take 1 capsule (500 mg total) by mouth 2 (two) times daily for 5 days. 10 capsule 0   Cholecalciferol 2000 units CAPS Take 1 capsule (2,000 Units total) by mouth daily. 90 each 1   cyanocobalamin 1000 MCG tablet Take 1,000 mcg by mouth daily.     donepezil (ARICEPT) 10 MG tablet Take 1 tablet (10 mg total) by mouth daily. 90 tablet 3   Multiple Vitamins-Minerals (PRESERVISION AREDS 2 PO) Take 1 tablet by mouth 2 (two) times daily.     No current facility-administered medications on file prior to visit.   Fall Risk  09/26/2020 01/18/2020  01/17/2019 05/30/2017 12/06/2016  Falls in the past year? 1 1 0 No No  Number falls in past yr: 0 0 0 - -  Injury with Fall? 1 0 0 - -  Risk for fall due to : Impaired vision - - - -  Follow up Falls prevention discussed - - - -   Depression screen [facility] 2/9 03/11/2020 08/15/2017 06/10/2017 12/06/2016 10/29/2016  Decreased Interest 3 0 0 0 0  Down, Depressed, Hopeless 1 0 0 1 3  PHQ - 2 Score 4 0 0 1 3  Altered sleeping 0 - - - 0  Tired, decreased energy 3 - - - -  Change in appetite 0 - - - 3  Feeling bad or failure about yourself  0 - - - 3  Trouble concentrating 3 - - - 0  Moving slowly or fidgety/restless 0 - - - 0  Suicidal thoughts 0 - - - 0  PHQ-9 Score 10 - - - 3  Difficult doing work/chores - - - - Not difficult at all  Some recent data might be hidden   RN CM will provide ongoing education and support to patient through phone calls.   RN CM will send welcome packet with consent to patient.   RN CM will send initial barriers letter, assessment, and care plan to primary care physician.   Patient agrees to follow-up and careplan. Will outreach again in the next 3-4 weeks  Jone Baseman, RN, MSN [facility] Care Management Care Management Coordinator Direct Line [phone] Toll Free: [phone]  Fax: [phone]

## 2020-10-02 ENCOUNTER — Telehealth: Payer: Self-pay

## 2020-10-02 NOTE — Telephone Encounter (Signed)
   Telephone encounter was:  Successful.  10/02/2020 Name: AGATHE MCCLUNEY MRN: [id number] DOB: [birth year]  CHALET KETCHAM is a 87 y.o. year old female who is a primary care patient of Raoul Pitch, Renee A, DO . The community resource team was consulted for assistance with  medical alert system.  Care guide performed the following interventions: Left message with patient's daughter-in-law to have her son Brandin Rosa return my call regarding email sent on 09/26/20 with medical alert system information.  Follow Up Plan:  Care guide will follow up with patient by phone over the next 7 days  Harrol Novello, AAS Paralegal, [place] Management  [address], [place] [address] ??[email or web address]  ?? [id number]   [email or web address]

## 2020-10-03 ENCOUNTER — Telehealth: Payer: Self-pay

## 2020-10-03 NOTE — Telephone Encounter (Signed)
   Telephone encounter was:  Successful.  10/03/2020 Name: Gabrielle [name] MRN: [id number] DOB: November 24, 1930  Gabrielle [name] is a 87 y.o. year old female who is a primary care patient of Raoul Pitch, [place], DO . The community resource team was consulted for assistance with  medical alert system.  Care guide performed the following interventions: Spoke with patient's son Canna Swadley he has received the email with the medical alert information for his mother. No further assistance is needed at this time.  Follow Up Plan:  No further follow up planned at this time. The patient has been provided with needed resources.  Cyle Kenyon, AAS Paralegal, [place] Management  [address], [place] [address] ??[email or web address]  ?? [email or web address]   [email or web address]

## 2020-10-15 ENCOUNTER — Other Ambulatory Visit: Payer: Self-pay

## 2020-10-15 NOTE — Patient Outreach (Signed)
[facility]) Care Management  10/15/2020  BERTILLA SHATTLES February 14, 1931 [id number]   Telephone cal to son [name] for follow up  No answer.  HIPAA compliant voice message left.    Plan: RN CM will attempt again in the month of September of no return call.   Jone Baseman, RN, MSN [place] Management Care Management Coordinator Direct Line [phone] Cell [phone] Toll Free: [phone]  Fax: [phone]

## 2020-10-15 NOTE — Patient Instructions (Signed)
Goals Addressed             This Visit's Progress    Find Help in My Community   On track    Barriers: Knowledge Timeframe:  Short-Term Goal Priority:  High Start Date:  09/26/20                           Expected End Date:      12/29/20           Follow Up Date 11/28/20  - call 211 when I need some help - follow-up on any referrals for help I am given - have a back-up plan    Why is this important?   Knowing how and where to find help for yourself or family in your neighborhood and community is an important skill.  You will want to take some steps to learn how.    Notes: 09/26/20 Son looking to have patient placed in assisted living facility. Son and daughter to have conversation with patient over the weekend.  Care guides to call for resources. Advised to use a place for mom website to begin search of facility.   10/15/20 Family continues to work at getting placement complete. Working with insurance company at this time for authorization for daughter to correspond on [employer].       Manage Fatigue (Tiredness-COPD)   On track    Barriers: Health Behaviors Knowledge Timeframe:  Long-Range Goal Priority:  Medium Start Date:       09/26/20                      Expected End Date:   02/28/21                    Follow Up Date 11/28/20   - eat healthy - practice relaxation or meditation daily    Why is this important?   Feeling tired or worn out is a common symptom of COPD (chronic obstructive pulmonary disease).  Learning when you feel your best and when you need rest is important.  Managing the tiredness (fatigue) will help you be active and enjoy life.     Notes:09/26/20 Pace self with activity.   Avoid day time heating Take frequent rest breaks  10/15/20 Patient continues to smoke.  COPD managed at this time without medication.

## 2020-10-15 NOTE — Patient Outreach (Signed)
Gabrielle [name]) Care Management  [place]  10/15/2020   Gabrielle [name] 1930/04/03 [id number]  Subjective: Incoming call from son. He reports that that mom is doing ok. They are currently working with the insurance company for authorization on patients behalf. Patient continues COPD management.    Objective:   Encounter Medications:  Outpatient Encounter Medications as of 10/15/2020  Medication Sig   Cholecalciferol 2000 units CAPS Take 1 capsule (2,000 Units total) by mouth daily.   cyanocobalamin 1000 MCG tablet Take 1,000 mcg by mouth daily.   donepezil (ARICEPT) 10 MG tablet Take 1 tablet (10 mg total) by mouth daily.   Multiple Vitamins-Minerals (PRESERVISION AREDS 2 PO) Take 1 tablet by mouth 2 (two) times daily.   No facility-administered encounter medications on file as of 10/15/2020.    Functional Status:  In your present state of health, do you have any difficulty performing the following activities: 09/26/2020  Hearing? N  Vision? N  Difficulty concentrating or making decisions? Y  Comment son helps  Walking or climbing stairs? N  Dressing or bathing? N  Doing errands, shopping? Y  Comment son takes  Conservation officer, nature and eating ? N  Using the Toilet? N  In the past six months, have you accidently leaked urine? N  Do you have problems with loss of bowel control? N  Managing your Medications? Y  Comment pill packs [facility]  Managing your Finances? Y  Comment son does  Runner, broadcasting/film/video? Y  Comment son helps as needed house cluttered  Some recent data might be hidden    Fall/Depression Screening: Fall Risk  09/26/2020 01/18/2020 01/17/2019  Falls in the past year? 1 1 0  Number falls in past yr: 0 0 0  Injury with Fall? 1 0 0  Risk for fall due to : Impaired vision - -  Follow up Falls prevention discussed - -   PHQ 2/9 Scores 09/26/2020 03/11/2020 08/15/2017 06/10/2017 12/06/2016 10/29/2016 08/19/2016   PHQ - 2 Score - 4 0 0 1 3 0  PHQ- 9 Score - 10 - - - 3 -  Exception Documentation Other- indicate reason in comment box - - - - - Medical reason  Not completed assessment completed by son - - - - - -    Assessment:   Care Plan Care Plan : COPD (Adult)  Updates made by Jon Billings, RN since 10/15/2020 12:00 AM     Problem: Symptom Exacerbation (COPD)      Long-Range Goal: Symptom Exacerbation Prevented or Minimized as evidenced by no acute episode of COPD.   Start Date: 09/26/2020  Expected End Date: 02/28/2021  This Visit's Progress: On track  Priority: High  Note:   Evidence-based guidance:  Monitor for signs of respiratory infection, including changes in sputum color, volume and thickness, as well as fever.  Encourage infection prevention strategies that may include prophylactic antibiotic therapy for patients with history of frequent exacerbations or antibiotic administration during exacerbation based on presentation, risk and benefit.  Encourage receipt of influenza and pneumococcal vaccine.  Prepare patient for use of home long-term oxygen therapy in presence of sever resting hypoxemia.  Prepare patients for laboratory studies or diagnostic exams, such as spirometry, pulse oximetry and arterial blood gas based on current symptoms, risk factors and presentation.  Assess barriers and manage adherence, including inhaler technique and persistent trigger exposure; encourage adherence, even when symptoms are controlled or infrequent.  Assess and monitor for signs/symptoms  of psychosocial concerns, such as shortness of breath-anxiety cycle or depression that may impact stability of symptoms.  Identify economic resources, sociocultural beliefs, social factors and health literacy that may interfere with adherence.  Promote lifestyle changes when needed, including regular physical activity based on tolerance, weight loss, healthy eating and stress management.  Consider referral to nurse  or community health worker or home-visiting program for intensive support and education (disease-management program).  Increase frequency of follow-up following exacerbation or hospitalization; consider transition of care interventions, such as hospital visit, home visit, telephone follow-up, review of discharge summary and resource referrals.   Notes:     Task: Identify and Minimize Risk of COPD Exacerbation   Due Date: 02/28/2021  Priority: Routine  Responsible User: Jon Billings, RN  Note:   Care Management Activities:    - healthy lifestyle promoted - signs/symptoms of infection reviewed    Notes: 09/26/20 Patient has COPD diagnosis but no inhalers. Patient manages but tires easily per son.  COPD Management Discussed: Encouraged patient to pace self with activity. Reviewed when to call for physician for increased shortness of breath, increased sputum, fatigue that is not his normal, cough, fever or sick contacts.  10/15/20 No acute problems with COPD.  Patient still smokes and does not want to quit per son.  COPD management encouraged.      Care Plan : General Plan of Care (Adult)  Updates made by Jon Billings, RN since 10/15/2020 12:00 AM     Problem: Health Promotion or Disease Self-Management (General Plan of Care)      Goal: Self-Management Plan Developed as evideneced by patient being placed in assisted living   Start Date: 09/26/2020  Expected End Date: 12/28/2020  This Visit's Progress: On track  Priority: High  Note:   Evidence-based guidance:  Review biopsychosocial determinants of health screens.  Determine level of modifiable health risk.  Assess level of patient activation, level of readiness, importance and confidence to make changes.  Evoke change talk using open-ended questions, pros and cons, as well as looking forward.  Identify areas where behavior change may lead to improved health.  Partner with patient to develop a robust self-management plan that  includes lifestyle factors, such as weight loss, exercise and healthy nutrition, as well as goals specific to disease risks.  Support patient and family/caregiver active participation in decision-making and self-management plan.  Implement additional goals and interventions based on identified risk factors to reduce health risk.  Facilitate advance care planning.  Review need for preventive screening based on age, sex, family history and health history.   Notes:     Task: Mutually Develop and Royce Macadamia Achievement of Patient Goals   Due Date: 12/28/2020  Priority: Routine  Responsible User: Jon Billings, RN  Note:   Care Management Activities:    - collaboration with team encouraged - problem-solving facilitated - reassurance provided    Notes: 09/26/20 Family looking to place patient.  Patient has dementia and per son it is worse recently. Patient home cluttered and patient still smokes.  Family to work with care guide and resources for placement. Family also encouraged to look at website a place for mom to help in finding facilities.   10/15/20 Son [name] states they are still working to get info from insurance but patient has to sign paper to give daughter permission. Once they can get that done they will figure out about the long term care insurance and hopefully find a place that will take patient.  Patient continues  to smoke and family hopes to place somewhere that she can smoke. He did received assisted living listings sent and will research for an appropriate facility.        Goals Addressed             This Visit's Progress    Find Help in My Community   On track    Barriers: Knowledge Timeframe:  Short-Term Goal Priority:  High Start Date:  09/26/20                           Expected End Date:      12/29/20           Follow Up Date 11/28/20  - call 211 when I need some help - follow-up on any referrals for help I am given - have a back-up plan    Why is this  important?   Knowing how and where to find help for yourself or family in your neighborhood and community is an important skill.  You will want to take some steps to learn how.    Notes: 09/26/20 Son looking to have patient placed in assisted living facility. Son and daughter to have conversation with patient over the weekend.  Care guides to call for resources. Advised to use a place for mom website to begin search of facility.   10/15/20 Family continues to work at getting placement complete. Working with insurance company at this time for authorization for daughter to correspond on [employer].       Manage Fatigue (Tiredness-COPD)   On track    Barriers: Health Behaviors Knowledge Timeframe:  Long-Range Goal Priority:  Medium Start Date:       09/26/20                      Expected End Date:   02/28/21                    Follow Up Date 11/28/20   - eat healthy - practice relaxation or meditation daily    Why is this important?   Feeling tired or worn out is a common symptom of COPD (chronic obstructive pulmonary disease).  Learning when you feel your best and when you need rest is important.  Managing the tiredness (fatigue) will help you be active and enjoy life.     Notes:09/26/20 Pace self with activity.   Avoid day time heating Take frequent rest breaks  10/15/20 Patient continues to smoke.  COPD managed at this time without medication.          Plan:  Follow-up: Patient agrees to Care Plan and Follow-up. Follow-up in 4-6 week(s)  Jone Baseman, RN, MSN [place] Management Care Management Coordinator Direct Line [phone] Cell [phone] Toll Free: [phone]  Fax: [phone]

## 2020-10-28 ENCOUNTER — Other Ambulatory Visit: Payer: Self-pay

## 2020-10-28 ENCOUNTER — Other Ambulatory Visit: Payer: Self-pay | Admitting: Family Medicine

## 2020-10-28 ENCOUNTER — Ambulatory Visit
Admission: RE | Admit: 2020-10-28 | Discharge: 2020-10-28 | Disposition: A | Discharge status: Home / Self Care | Payer: Medicare PPO | Source: Ambulatory Visit | Attending: Family Medicine | Admitting: Family Medicine

## 2020-10-28 ENCOUNTER — Ambulatory Visit: Payer: Medicare PPO

## 2020-10-28 DIAGNOSIS — N649 Disorder of breast, unspecified: Secondary | ICD-10-CM

## 2020-10-28 DIAGNOSIS — Z1231 Encounter for screening mammogram for malignant neoplasm of breast: Secondary | ICD-10-CM

## 2020-11-19 ENCOUNTER — Other Ambulatory Visit: Payer: Self-pay

## 2020-11-19 NOTE — Patient Instructions (Signed)
Goals Addressed             This Visit's Progress    Find Help in My Community   On track    Barriers: Knowledge Timeframe:  Short-Term Goal Priority:  High Start Date:  09/26/20                           Expected End Date:      01/28/21  Follow Up Date 12/29/20  - call 211 when I need some help - follow-up on any referrals for help I am given - have a back-up plan    Why is this important?   Knowing how and where to find help for yourself or family in your neighborhood and community is an important skill.  You will want to take some steps to learn how.    Notes: 09/26/20 Son looking to have patient placed in assisted living facility. Son and daughter to have conversation with patient over the weekend.  Care guides to call for resources. Advised to use a place for mom website to begin search of facility.   10/15/20 Family continues to work at getting placement complete. Working with insurance company at this time for authorization for daughter to correspond on paitents behalf.   11/19/20 Family has gotten through to [facility] but on hold pending family talk with patient.  Conversation ? Scheduled for 11/23/20.  CM to continue to follow.       Manage Fatigue (Tiredness-COPD)   On track    Barriers: Health Behaviors Knowledge Timeframe:  Long-Range Goal Priority:  Medium Start Date:       09/26/20                      Expected End Date:   02/28/21                    Follow Up Date 12/29/20  - eat healthy - practice relaxation or meditation daily    Why is this important?   Feeling tired or worn out is a common symptom of COPD (chronic obstructive pulmonary disease).  Learning when you feel your best and when you need rest is important.  Managing the tiredness (fatigue) will help you be active and enjoy life.     Notes:09/26/20 Pace self with activity.   Avoid day time heating Take frequent rest breaks  10/15/20 Patient continues to smoke.  COPD managed at this time  without medication.   11/19/20 No plans to quit smoking.  Patient able to manage COPD through rest breaks.

## 2020-11-19 NOTE — Patient Outreach (Signed)
[facility]) Care Management  [place]  11/19/2020   Gabrielle [name] 1930-12-30 [id number]  Subjective:  Telephone call to son for follow up.  Family continues to work on possible placement for patient.  Patient having trouble managing the home.  Family to have conversation around giving up home and moving.  COPD management continues through self pacing and rest.  No concerns.    Objective:   Encounter Medications:  Outpatient Encounter Medications as of 11/19/2020  Medication Sig   Cholecalciferol 2000 units CAPS Take 1 capsule (2,000 Units total) by mouth daily.   cyanocobalamin 1000 MCG tablet Take 1,000 mcg by mouth daily.   donepezil (ARICEPT) 10 MG tablet Take 1 tablet (10 mg total) by mouth daily.   Multiple Vitamins-Minerals (PRESERVISION AREDS 2 PO) Take 1 tablet by mouth 2 (two) times daily.   No facility-administered encounter medications on file as of 11/19/2020.    Functional Status:  In your present state of health, do you have any difficulty performing the following activities: 09/26/2020  Hearing? N  Vision? N  Difficulty concentrating or making decisions? Y  Comment son helps  Walking or climbing stairs? N  Dressing or bathing? N  Doing errands, shopping? Y  Comment son takes  Conservation officer, nature and eating ? N  Using the Toilet? N  In the past six months, have you accidently leaked urine? N  Do you have problems with loss of bowel control? N  Managing your Medications? Y  [facility] packs [facility]  Managing your Finances? Y  Comment son does  Runner, broadcasting/film/video? Y  Comment son helps as needed house cluttered  Some recent data might be hidden    Fall/Depression Screening: Fall Risk  09/26/2020 01/18/2020 01/17/2019  Falls in the past year? 1 1 0  Number falls in past yr: 0 0 0  Injury with Fall? 1 0 0  Risk for fall due to : Impaired vision - -  Follow up Falls prevention discussed - -    PHQ 2/9 Scores 09/26/2020 03/11/2020 08/15/2017 06/10/2017 12/06/2016 10/29/2016 08/19/2016  PHQ - 2 Score - 4 0 0 1 3 0  PHQ- 9 Score - 10 - - - 3 -  Exception Documentation Other- indicate reason in comment box - - - - - Medical reason  Not completed assessment completed by son - - - - - -    Assessment:   Care Plan Care Plan : COPD (Adult)  Updates made by Jon Billings, RN since 11/19/2020 12:00 AM     Problem: Symptom Exacerbation (COPD)      Long-Range Goal: Symptom Exacerbation Prevented or Minimized as evidenced by no acute episode of COPD.   Start Date: 09/26/2020  Expected End Date: 02/28/2021  This Visit's Progress: On track  Recent Progress: On track  Priority: High  Note:   Evidence-based guidance:  Monitor for signs of respiratory infection, including changes in sputum color, volume and thickness, as well as fever.  Encourage infection prevention strategies that may include prophylactic antibiotic therapy for patients with history of frequent exacerbations or antibiotic administration during exacerbation based on presentation, risk and benefit.  Encourage receipt of influenza and pneumococcal vaccine.  Prepare patient for use of home long-term oxygen therapy in presence of sever resting hypoxemia.  Prepare patients for laboratory studies or diagnostic exams, such as spirometry, pulse oximetry and arterial blood gas based on current symptoms, risk factors and presentation.  Assess barriers and  manage adherence, including inhaler technique and persistent trigger exposure; encourage adherence, even when symptoms are controlled or infrequent.  Assess and monitor for signs/symptoms of psychosocial concerns, such as shortness of breath-anxiety cycle or depression that may impact stability of symptoms.  Identify economic resources, sociocultural beliefs, social factors and health literacy that may interfere with adherence.  Promote lifestyle changes when needed, including regular  physical activity based on tolerance, weight loss, healthy eating and stress management.  Consider referral to nurse or community health worker or home-visiting program for intensive support and education (disease-management program).  Increase frequency of follow-up following exacerbation or hospitalization; consider transition of care interventions, such as hospital visit, home visit, telephone follow-up, review of discharge summary and resource referrals.   Notes:     Task: Identify and Minimize Risk of COPD Exacerbation   Due Date: 02/28/2021  Priority: Routine  Responsible User: Jon Billings, RN  Note:   Care Management Activities:    - healthy lifestyle promoted - signs/symptoms of infection reviewed    Notes: 09/26/20 Patient has COPD diagnosis but no inhalers. Patient manages but tires easily per son.  COPD Management Discussed: Encouraged patient to pace self with activity. Reviewed when to call for physician for increased shortness of breath, increased sputum, fatigue that is not his normal, cough, fever or sick contacts.  10/15/20 No acute problems with COPD.  Patient still smokes and does not want to quit per son.  COPD management encouraged.   11/19/20 Patient doing good per son [name]. Patient continues to smoke.  Encouraged continued COPD management.     Care Plan : General Plan of Care (Adult)  Updates made by Jon Billings, RN since 11/19/2020 12:00 AM     Problem: Health Promotion or Disease Self-Management (General Plan of Care)      Goal: Self-Management Plan Developed as evidenced by patient being placed in assisted living   Start Date: 09/26/2020  Expected End Date: 01/28/2021  This Visit's Progress: On track  Recent Progress: On track  Priority: High  Note:   Evidence-based guidance:  Review biopsychosocial determinants of health screens.  Determine level of modifiable health risk.  Assess level of patient activation, level of readiness, importance and  confidence to make changes.  Evoke change talk using open-ended questions, pros and cons, as well as looking forward.  Identify areas where behavior change may lead to improved health.  Partner with patient to develop a robust self-management plan that includes lifestyle factors, such as weight loss, exercise and healthy nutrition, as well as goals specific to disease risks.  Support patient and family/caregiver active participation in decision-making and self-management plan.  Implement additional goals and interventions based on identified risk factors to reduce health risk.  Facilitate advance care planning.  Review need for preventive screening based on age, sex, family history and health history.   Notes:     Task: Mutually Develop and Royce Macadamia Achievement of Patient Goals   Due Date: 01/28/2021  Priority: Routine  Responsible User: Jon Billings, RN  Note:   Care Management Activities:    - collaboration with team encouraged - problem-solving facilitated - reassurance provided    Notes: 09/26/20 Family looking to place patient.  Patient has dementia and per son it is worse recently. Patient home cluttered and patient still smokes.  Family to work with care guide and resources for placement. Family also encouraged to look at website a place for mom to help in finding facilities.   10/15/20 Son [name] states they  are still working to get info from insurance but patient has to sign paper to give daughter permission. Once they can get that done they will figure out about the long term care insurance and hopefully find a place that will take patient.  Patient continues to smoke and family hopes to place somewhere that she can smoke. He did received assisted living listings sent and will research for an appropriate facility.   11/19/20 Family has gotten through with insurance company and that part is on hold pending talk with patient to get patient to agree to assisted living or someone coming  into home to help care for her.  ? Conversation on 11/23/20 per son.        Goals Addressed             This Visit's Progress    Find Help in My Community   On track    Barriers: Knowledge Timeframe:  Short-Term Goal Priority:  High Start Date:  09/26/20                           Expected End Date:      01/28/21  Follow Up Date 12/29/20  - call 211 when I need some help - follow-up on any referrals for help I am given - have a back-up plan    Why is this important?   Knowing how and where to find help for yourself or family in your neighborhood and community is an important skill.  You will want to take some steps to learn how.    Notes: 09/26/20 Son looking to have patient placed in assisted living facility. Son and daughter to have conversation with patient over the weekend.  Care guides to call for resources. Advised to use a place for mom website to begin search of facility.   10/15/20 Family continues to work at getting placement complete. Working with insurance company at this time for authorization for daughter to correspond on paitents behalf.   11/19/20 Family has gotten through to [facility] but on hold pending family talk with patient.  Conversation ? Scheduled for 11/23/20.  CM to continue to follow.       Manage Fatigue (Tiredness-COPD)   On track    Barriers: Health Behaviors Knowledge Timeframe:  Long-Range Goal Priority:  Medium Start Date:       09/26/20                      Expected End Date:   02/28/21                    Follow Up Date 12/29/20  - eat healthy - practice relaxation or meditation daily    Why is this important?   Feeling tired or worn out is a common symptom of COPD (chronic obstructive pulmonary disease).  Learning when you feel your best and when you need rest is important.  Managing the tiredness (fatigue) will help you be active and enjoy life.     Notes:09/26/20 Pace self with activity.   Avoid day time heating Take frequent  rest breaks  10/15/20 Patient continues to smoke.  COPD managed at this time without medication.   11/19/20 No plans to quit smoking.  Patient able to manage COPD through rest breaks.          Plan:  Follow-up: Patient agrees to Care Plan and Follow-up. Follow-up in 4-6 week(s)  Shavonte Zhao  Durwin Reges, RN, MSN [place] Management Care Management Coordinator Direct Line [phone] Cell [phone] Toll Free: [phone]  Fax: [phone]

## 2020-11-20 ENCOUNTER — Ambulatory Visit: Payer: Self-pay

## 2020-12-24 ENCOUNTER — Other Ambulatory Visit: Payer: Self-pay

## 2020-12-24 NOTE — Patient Outreach (Signed)
[place] [facility]) Care Management  12/24/2020  Gabrielle [name] 05/14/1930 [id number]   Telephone call to son Gabrielle [name] for follow up. No answer. HIPAA compliant voice message left.  Plan: RN CM will attempt again in the month of January.   Jone Baseman, RN, MSN [place] Management Care Management Coordinator Direct Line [phone] Cell [phone] Toll Free: [phone]  Fax: [phone]

## 2021-01-05 ENCOUNTER — Other Ambulatory Visit: Payer: Self-pay

## 2021-01-05 ENCOUNTER — Encounter: Payer: Self-pay | Admitting: Family Medicine

## 2021-01-05 ENCOUNTER — Ambulatory Visit (INDEPENDENT_AMBULATORY_CARE_PROVIDER_SITE_OTHER): Payer: Medicare PPO | Admitting: Family Medicine

## 2021-01-05 VITALS — BP 99/61 | HR 82 | Temp 98.8°F | Wt 110.0 lb

## 2021-01-05 DIAGNOSIS — N390 Urinary tract infection, site not specified: Secondary | ICD-10-CM

## 2021-01-05 DIAGNOSIS — R42 Dizziness and giddiness: Secondary | ICD-10-CM

## 2021-01-05 DIAGNOSIS — J449 Chronic obstructive pulmonary disease, unspecified: Secondary | ICD-10-CM | POA: Diagnosis not present

## 2021-01-05 DIAGNOSIS — Z23 Encounter for immunization: Secondary | ICD-10-CM | POA: Diagnosis not present

## 2021-01-05 DIAGNOSIS — R531 Weakness: Secondary | ICD-10-CM | POA: Diagnosis not present

## 2021-01-05 LAB — POCT URINALYSIS DIPSTICK
Bilirubin, UA: NEGATIVE
Blood, UA: POSITIVE
Glucose, UA: NEGATIVE
Ketones, UA: NEGATIVE
Nitrite, UA: POSITIVE
Protein, UA: POSITIVE — AB
Spec Grav, UA: 1.02 (ref 1.010–1.025)
Urobilinogen, UA: 0.2 E.U./dL
pH, UA: 6 (ref 5.0–8.0)

## 2021-01-05 MED ORDER — CEFDINIR 300 MG PO CAPS: 300.0000 mg | ORAL_CAPSULE | Freq: Two times a day (BID) | ORAL | 0 refills | Status: DC

## 2021-01-05 NOTE — Progress Notes (Signed)
This visit occurred during the SARS-CoV-2 public health emergency.  Safety protocols were in place, including screening questions prior to the visit, additional usage of staff PPE, and extensive cleaning of exam room while observing appropriate contact time as indicated for disinfecting solutions.    Gabrielle [name] , October 17, 1930, 87 y.o., female MRN: [id number] Patient Care Team    Relationship Specialty Notifications Start End  Ma Hillock, DO PCP - General Family Medicine  08/01/15   Excell Seltzer, MD (Inactive) Consulting Physician General Surgery  11/29/13   Magrinat, Virgie Dad, MD Consulting Physician Oncology  11/29/13   Arloa Koh, MD (Inactive) Consulting Physician Radiation Oncology  11/29/13   Jarome Matin, MD Consulting Physician Dermatology  06/11/15   Brand Males, MD Consulting Physician Pulmonary Disease  06/11/15   Alda Berthold, DO Consulting Physician Neurology  08/01/15   Armbruster, Carlota Raspberry, MD Consulting Physician Gastroenterology  12/06/16   Bjorn Loser, MD Consulting Physician Urology  06/19/19   Jon Billings, [facility]   09/26/20     Chief Complaint  Patient presents with   Dizziness    Pt reports dizziness, shaking, fatigue  worsen 1 week ago      Subjective: Pt presents for an OV with complaints of lower extremity weakness, shaking and mild dizziness as well as fatigue for approximately 1 week.  Patient presents today with her son who also provides history.  He states they were looking into a nursing home/independent living arrangement when she became shaky and her lower extremities weak.  She reports she did not feel nervous about the arrangement.  They are looking into an independent living in which she has a 2 bedroom unit with her brother.  She denies any chest pain, shortness of breath, headache or syncopal episode.  She had eaten that morning.  She is diagnosed with COPD, and has historically not needed  inhalers.  She is a smoker.  Her son has noticed she walks in her room and the need to sit down secondary to fatigue.  She does not exercise.  Her weight is stable and she has been able to gain weight back.  She denies any blood loss.  She states she feels fine and she thinks it is from "old age.  "  Depression screen [facility] 2/9 01/05/2021 03/11/2020 08/15/2017 06/10/2017 12/06/2016  Decreased Interest 0 3 0 0 0  Down, Depressed, Hopeless 0 1 0 0 1  PHQ - 2 Score 0 4 0 0 1  Altered sleeping - 0 - - -  Tired, decreased energy - 3 - - -  Change in appetite - 0 - - -  Feeling bad or failure about yourself  - 0 - - -  Trouble concentrating - 3 - - -  Moving slowly or fidgety/restless - 0 - - -  Suicidal thoughts - 0 - - -  PHQ-9 Score - 10 - - -  Difficult doing work/chores - - - - -  Some recent data might be hidden    No Known Allergies Social History   Social History Narrative   Gabrielle [name] lives alone in a [place] home. She has two grown children.   She continues to work part-time for [employer].    She has a grown son-lives in [place] in [place]. [name] is her nephew.   Right handed   One story home   Past Medical History:  Diagnosis Date  Anxiety    Breast cancer ([place]) 2015   right   Breast cancer of upper-outer quadrant of right female breast ([place]) 10/2013   Anastrazole since 12/2013--plan is to take this for 5 yrs.  No evidence of dz recurrence as of 11/2015 oncol f/u.   Cataract    COPD (chronic obstructive pulmonary disease) (HCC)    Lung nodule, solitary 11/21/2013   6 mm lung nodule R lung seen on chest CT.  R breast mass. Disgnostic MMG pending.    Macular degeneration    Osteoporosis 10/2014   T-score -2.8   Pneumonia    hx of    Retina hole    Skin cancer    Stroke ([place])    Tobacco dependence    Past Surgical History:  Procedure Laterality Date   ABDOMINAL HYSTERECTOMY  1980   (ovaries intact)   [place]    Patient reports approximately 30 years ago having "bladder surgery "in which she describes a procedure that either stretched her bladder or her urethra dilated secondary to her not being able to void.   BREAST BIOPSY Right    CATARACT EXTRACTION Bilateral    MASTECTOMY Right 2015   MOHS SURGERY     Dr. Sarajane Jews   PARS PLANA VITRECTOMY W/ REPAIR OF MACULAR HOLE Right    RETINAL LASER PROCEDURE     TOTAL MASTECTOMY Right 12/18/2013   Procedure: RIGHT TOTAL MASTECTOMY;  Surgeon: Excell Seltzer, MD;  Location: [facility];  Service: General;  Laterality: Right;   Family History  Problem Relation Age of Onset   Heart attack Mother        Deceased   Cancer Mother        fallopian tube cancer in 58s; deceased 37   Throat cancer Father        Deceased 38; smoker   Diabetes Brother    Prostate cancer Brother 62       Currently 89   Other Brother        Deceased, hemorhhage of pancreas   Leukemia Maternal Aunt    Breast cancer Paternal Aunt        age at diagnosis unknown   Throat cancer Paternal Uncle        Deceased 41s; smoker   Leukemia Paternal Grandmother    Allergies as of 01/05/2021   No Known Allergies      Medication List        Accurate as of January 05, 2021 11:59 PM. If you have any questions, ask your nurse or doctor.          cefdinir 300 MG capsule Commonly known as: OMNICEF Take 1 capsule (300 mg total) by mouth 2 (two) times daily. Started by: Howard Pouch, DO   Cholecalciferol 50 MCG (2000 UT) Caps Take 1 capsule (2,000 Units total) by mouth daily.   cyanocobalamin 1000 MCG tablet Take 1,000 mcg by mouth daily.   donepezil 10 MG tablet Commonly known as: ARICEPT Take 1 tablet (10 mg total) by mouth daily.   PRESERVISION AREDS 2 PO Take 1 tablet by mouth 2 (two) times daily.        All past medical history, surgical history, allergies, family history, immunizations andmedications were updated in the EMR today and reviewed under the history and  medication portions of their EMR.     ROS: Negative, with the exception of above mentioned in HPI   Objective:  BP 99/61   Pulse  82   Temp 98.8 F (37.1 C) (Oral)   Wt 110 lb (49.9 kg)   SpO2 94%   BMI 18.88 kg/m  Body mass index is 18.88 kg/m. Gen: Afebrile. No acute distress. Nontoxic in appearance, pleasant female. HENT: AT. [place]. MMM, no cough. Eyes:Pupils Equal Round Reactive to light, Extraocular movements intact,  Conjunctiva without redness, discharge or icterus. CV: RRR no murmur, no edema Chest: CTAB, no wheeze or crackles. Good air movement, normal resp effort.  Neuro: Normal gait. PERLA. EOMi. Alert. Oriented x3  Psych: Normal affect, dress and demeanor. Normal speech. Normal thought content and judgment.  No results found. No results found. Results for orders placed or performed in visit on 01/05/21 (from the past 24 hour(s))  POCT Urinalysis Dipstick     Status: Abnormal   Collection Time: 01/05/21  4:12 PM  Result Value Ref Range   Color, UA yellow    Clarity, UA cloudy    Glucose, UA Negative Negative   Bilirubin, UA neg    Ketones, UA neg    Spec Grav, UA 1.020 1.010 - 1.025   Blood, UA pos    pH, UA 6.0 5.0 - 8.0   Protein, UA Positive (A) Negative   Urobilinogen, UA 0.2 0.2 or 1.0 E.U./dL   Nitrite, UA pos    Leukocytes, UA Large (3+) (A) Negative   Appearance     Odor    Iron, TIBC and Ferritin Panel     Status: None   Collection Time: 01/05/21  4:31 PM  Result Value Ref Range   Iron 77 45 - 160 mcg/dL   TIBC 303 250 - 450 mcg/dL (calc)   %SAT 25 16 - 45 % (calc)   Ferritin 77 16 - 288 ng/mL    Assessment/Plan: FARRIE SANN is a 87 y.o. female present for OV for  Frequent UTI/dizziness/weakness Patient is known to have frequent UTIs and dizziness/weakness is sometimes her symptoms.  Point-of-care urinalysis today looks infectious.  Elected to treat with antibiotics while we await urine culture and other lab results.  We will rule out anemia  or iron deficiency as well as renal function. -Start Omnicef twice daily -Consider needing reevaluation for pulmonology/COPD.  Since there is a report of increased fatigue/weakness with exertion. - POCT Urinalysis Dipstick - Urine Culture - CBC w/Diff - Iron, TIBC and Ferritin Panel - Comp Met (CMET)  Need for influenza vaccination Patient reports she feels well enough to get her flu shot today. - Flu Vaccine QUAD High Dose(Fluad)  Reviewed expectations re: course of current medical issues. Discussed self-management of symptoms. Outlined signs and symptoms indicating need for more acute intervention. Patient verbalized understanding and all questions were answered. Patient received an After-Visit Summary.    Orders Placed This Encounter  Procedures   Urine Culture   Flu Vaccine QUAD High Dose(Fluad)   CBC w/Diff   Iron, TIBC and Ferritin Panel   Comp Met (CMET)   POCT Urinalysis Dipstick   Meds ordered this encounter  Medications   cefdinir (OMNICEF) 300 MG capsule    Sig: Take 1 capsule (300 mg total) by mouth 2 (two) times daily.    Dispense:  14 capsule    Refill:  0    Referral Orders  No referral(s) requested today     Note is dictated utilizing voice recognition software. Although note has been proof read prior to signing, occasional typographical errors still can be missed. If any questions arise, please do not hesitate to  call for verification.   electronically signed by:  Howard Pouch, DO  [place]

## 2021-01-05 NOTE — Patient Instructions (Signed)
Great to see you today.  I have refilled the medication(s) we provide.   If labs were collected, we will inform you of lab results once received either by echart message or telephone call.   - echart message- for normal results that have been seen by the patient already.   - telephone call: abnormal results or if patient has not viewed results in their echart.  

## 2021-01-06 ENCOUNTER — Encounter: Payer: Self-pay | Admitting: Family Medicine

## 2021-01-06 LAB — CBC WITH DIFFERENTIAL/PLATELET
Basophils Absolute: 0.1 10*3/uL (ref 0.0–0.1)
Basophils Relative: 1.3 % (ref 0.0–3.0)
Eosinophils Absolute: 0 10*3/uL (ref 0.0–0.7)
Eosinophils Relative: 0.3 % (ref 0.0–5.0)
HCT: 40.2 % (ref 36.0–46.0)
Hemoglobin: 13.4 g/dL (ref 12.0–15.0)
Lymphocytes Relative: 19.1 % (ref 12.0–46.0)
Lymphs Abs: 1.1 10*3/uL (ref 0.7–4.0)
MCHC: 33.4 g/dL (ref 30.0–36.0)
MCV: 96.2 fl (ref 78.0–100.0)
Monocytes Absolute: 0.5 10*3/uL (ref 0.1–1.0)
Monocytes Relative: 8.8 % (ref 3.0–12.0)
Neutro Abs: 4.2 10*3/uL (ref 1.4–7.7)
Neutrophils Relative %: 70.5 % (ref 43.0–77.0)
Platelets: 239 10*3/uL (ref 150.0–400.0)
RBC: 4.18 Mil/uL (ref 3.87–5.11)
RDW: 13.5 % (ref 11.5–15.5)
WBC: 6 10*3/uL (ref 4.0–10.5)

## 2021-01-06 LAB — COMPREHENSIVE METABOLIC PANEL
ALT: 11 U/L (ref 0–35)
AST: 21 U/L (ref 0–37)
Albumin: 3.9 g/dL (ref 3.5–5.2)
Alkaline Phosphatase: 73 U/L (ref 39–117)
BUN: 22 mg/dL (ref 6–23)
CO2: 28 mEq/L (ref 19–32)
Calcium: 9.2 mg/dL (ref 8.4–10.5)
Chloride: 102 mEq/L (ref 96–112)
Creatinine, Ser: 0.98 mg/dL (ref 0.40–1.20)
GFR: 51.6 mL/min — ABNORMAL LOW (ref >60.00)
Glucose, Bld: 97 mg/dL (ref 70–99)
Potassium: 4.3 mEq/L (ref 3.5–5.1)
Sodium: 137 mEq/L (ref 135–145)
Total Bilirubin: 0.6 mg/dL (ref 0.2–1.2)
Total Protein: 6.4 g/dL (ref 6.0–8.3)

## 2021-01-07 LAB — URINE CULTURE
MICRO NUMBER:: 12602865
SPECIMEN QUALITY:: ADEQUATE

## 2021-01-07 LAB — IRON,TIBC AND FERRITIN PANEL
%SAT: 25 % (calc) (ref 16–45)
Ferritin: 77 ng/mL (ref 16–288)
Iron: 77 ug/dL (ref 45–160)
TIBC: 303 mcg/dL (calc) (ref 250–450)

## 2021-01-08 ENCOUNTER — Telehealth: Payer: Self-pay

## 2021-01-08 NOTE — Telephone Encounter (Signed)
Pt's daughter called and wanted to know what can be done to prevent UTI's. States she had that problem before and was put on estrogen and wanted to know if that is something that she would be able to tolerate. Pt is not doing well with personal hygiene. [facility] is [facility] into [facility] within the next month and concerned that pt is not taking medication the way she should be. Want to know if pt should get new COVID booster.  Tracie's callback number is [phone]

## 2021-01-10 ENCOUNTER — Telehealth: Payer: Self-pay | Admitting: Family Medicine

## 2021-01-10 MED ORDER — SULFAMETHOXAZOLE-TRIMETHOPRIM 800-160 MG PO TABS: 1.0000 | ORAL_TABLET | Freq: Two times a day (BID) | ORAL | 0 refills | Status: DC

## 2021-01-10 NOTE — Telephone Encounter (Signed)
Received call a nurse call that pt stopped recently prescribed cefdinir due to diarrhea.  UCx and sensitivities reviewed. Will Rx bactrim in its place.   Lab Results  Component Value Date   CREATININE 0.98 01/05/2021   BUN 22 01/05/2021   NA 137 01/05/2021   K 4.3 01/05/2021   CL 102 01/05/2021   CO2 28 01/05/2021

## 2021-01-12 NOTE — Telephone Encounter (Signed)
Spoke with daughter and informed her of provider's instruction. Daughter mentions how pt has issues with hygiene with self and home.

## 2021-01-12 NOTE — Telephone Encounter (Signed)
Please return patient's daughters call Unfortunately, there really is nothing that can "prevent "UTIs.  UTIs can become more of a problem in women later in life secondary to changes in the anatomy that occurs later in life.  Estrogen cream can be helpful at times, but not so much when it is a concern of personal hygiene. All attempt should be made at keeping her clean and dry, and monitoring for any recurrent UTIs symptoms recur.

## 2021-01-14 NOTE — Telephone Encounter (Signed)
[facility] - Client TELEPHONE ADVICE RECORD [facility] Patient Name: Gabrielle [name] Gender: Female DOB: Oct 24, 1930 Age: 87 Y 76 M 4 D Return Phone Number: [phone] (Primary), [phone] (Secondary) Address: City/State/Zip: [place] [address] Client [facility] - Client Client Site [place] - Day Physician Raoul Pitch, [place] Contact Type Call Who Is Calling Patient / Member / Family / Caregiver Call Type Triage / Clinical Caller Name Rickard Patience Relationship To Patient Daughter Return Phone Number [phone] (Primary) Chief Complaint Medication reaction Reason for Call Symptomatic / Request for [name] states her mother was seen last week for a uti. Pt stopped taking her medication due to it causing her diarrhea. She would like a different medication. Translation No Nurse Assessment Nurse: Harvie Bridge, RN, Beth Date/Time (Eastern Time): 01/10/2021 11:47:38 AM Confirm and document reason for call. If symptomatic, describe symptoms. ---Caller states her mother was seen last week for a UTI, the 7th. Pt stopped taking her medication: Keflex, due to it causing her diarrhea. She would like a different medication. Never had symptoms, does not have symptoms. Still having dizzy spells and shaking. [facility] [address] [place], [place] [address] [phone] Aller: NKA Does the patient have any new or worsening symptoms? ---Yes Will a triage be completed? ---Yes Related visit to physician within the last 2 weeks? ---Yes Does the PT have any chronic conditions? (i.e. diabetes, asthma, this includes High risk factors for pregnancy, etc.) ---Yes List chronic conditions. ---COPD Is this a behavioral health or substance abuse call? ---No Guidelines Guideline Title Affirmed Question Affirmed Notes Nurse Date/Time Eilene Ghazi Time) Urinary Tract Infection on Antibiotic  Follow-up Call - Female Preventing urinary tract infections (UTIs), questions about Newhart, RN, [facility] 01/10/2021 11:53:19 AM PLEASE NOTE: All timestamps contained within this report are represented as [place] Standard Time. CONFIDENTIALTY NOTICE: This fax transmission is intended only for the addressee. It contains information that is legally privileged, confidential or otherwise protected from use or disclosure. If you are not the intended recipient, you are strictly prohibited from reviewing, disclosing, copying using or disseminating any of this information or taking any action in reliance on or regarding this information. If you have received this fax in error, please notify us immediately by telephone so that we can arrange for its return to [place]. Phone: [phone], Toll-Free: [phone], Fax: [phone] Page: 2 of 2 Call Id: [id number] [name]. Time Eilene Ghazi Time) Disposition Final User 01/10/2021 11:11:26 AM Attempt made - message left Newhart, RN, Beth 01/10/2021 11:14:01 AM Attempt made - message left Newhart, RN, Beth 01/10/2021 11:58:10 AM Paged On Call back to [name], RN, [facility] 01/10/2021 11:55:50 AM Coulter, RN, Beth Caller Disagree/Comply Comply Caller Understands Yes PreDisposition Did not know what to do Care Advice Given Per Guideline HOME CARE: * You should be able to treat this at home. PREVENTING UTI - GENERAL TIPS THAT MAY HELP: * Drink liquids: Drink plenty of liquids (6-8 glasses of water each day). Water is good. * Urinate frequently: Empty your bladder frequently. Avoid holding urine for periods of time. PREVENTING UTI - CRANBERRY JUICE: * Dosage Cranberry Juice Cocktail: 8 oz (240 ml) twice a day. CALL BACK IF: * You have more questions CARE ADVICE given per Urinary Tract Infection on Antibiotic Follow-Up Call, Female (Adult) guideline. Comments User: Louretta Shorten, RN Date/Time Eilene Ghazi Time): 01/10/2021 12:25:29 PM Called daughter  back with on call directives. Paging DoctorName Phone DateTime Result/  Outcome Message Type Notes Ria Bush - MD [phone] 01/10/2021 11:58:10 AM Paged On Call Back to Call Center Doctor Paged Beth RN with Nurse Access/ Team Health calling on a patient. Please call me at [phone] Ria Bush - MD 01/10/2021 12:24:02 PM Spoke with On Call - General Message Result Will send in a different antibiotic

## 2021-01-15 NOTE — Telephone Encounter (Signed)
Daughter of patient calling back, [name] [name]) regarding message she left the other day.  1 of the questions were not answered by provider, so she asking to have it addressed again. Should patient have final covid booster?  Does it matter what brand?  All previous boosters were pfizer.  Please call [name] at [phone].

## 2021-01-16 ENCOUNTER — Other Ambulatory Visit: Payer: Self-pay

## 2021-01-16 ENCOUNTER — Encounter: Payer: Self-pay | Admitting: Neurology

## 2021-01-16 ENCOUNTER — Ambulatory Visit: Payer: Medicare PPO | Admitting: Neurology

## 2021-01-16 DIAGNOSIS — F01518 Vascular dementia, unspecified severity, with other behavioral disturbance: Secondary | ICD-10-CM

## 2021-01-16 DIAGNOSIS — F429 Obsessive-compulsive disorder, unspecified: Secondary | ICD-10-CM

## 2021-01-16 DIAGNOSIS — F423 Hoarding disorder: Secondary | ICD-10-CM

## 2021-01-16 MED ORDER — DONEPEZIL HCL 10 MG PO TABS: 10.0000 mg | ORAL_TABLET | Freq: Every day | ORAL | 3 refills | Status: DC

## 2021-01-16 NOTE — Patient Instructions (Signed)
Return to clinic 1 years
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
## 2021-01-16 NOTE — Telephone Encounter (Signed)
Spoke with daughter and informed her that pt can receive booster as long hasn't been dx with covid in last 12 or had a booster in the last 60 days. Brand has been provider preference.

## 2021-01-18 ENCOUNTER — Encounter: Payer: Self-pay | Admitting: Family Medicine

## 2021-01-27 ENCOUNTER — Telehealth: Payer: Self-pay | Admitting: Internal Medicine

## 2021-01-27 NOTE — Telephone Encounter (Signed)
[name] = former patient of mind with copd. [place] aunt for DR Pyrtle (GI) and DR Carlis Abbott (PCCM). Wants to see me back next few weeks. Please accommodate. - 30 min slot. HAve to work on some early morning slots like 8.30 or a late afternoon etc.,  Thanks    SIGNATURE    Dr. Brand Males, M.D., F.C.C.P,  Pulmonary and Critical Care Medicine Staff Physician, [place] Director - Interstitial Lung Disease  Program  Pulmonary [place] [facility] [place], [place], [address]  NPI Number:  NPI #[id number]  Pager: [phone], If no answer  -> Check AMION or Try [phone] Telephone (clinical office): [phone] Telephone (research): [phone]  3:16 PM 01/27/2021

## 2021-01-27 NOTE — Telephone Encounter (Signed)
Raquel Sarna, what date do you feel would work best with his schedule?

## 2021-01-28 NOTE — Telephone Encounter (Signed)
Called pt and spoke with son Gabrielle [name] and daughter Gabrielle [name] about info from MR. Per Gabrielle [name], him and his sister Gabrielle [name] were currently in the process of moving pt and would have to call office back to schedule pt appt.  Will await return call.  Please use an unavailable slot on days when MR is here 1/2 day only to get pt in for an appt with MR. If you have any problems when trying to schedule appt, ask me. Thank you!

## 2021-02-06 NOTE — Telephone Encounter (Signed)
Pt has been scheduled for an appt with MR 1/17. Closing encounter.

## 2021-02-18 ENCOUNTER — Other Ambulatory Visit: Payer: Self-pay

## 2021-02-18 ENCOUNTER — Ambulatory Visit (INDEPENDENT_AMBULATORY_CARE_PROVIDER_SITE_OTHER): Payer: Medicare PPO | Admitting: Family Medicine

## 2021-02-18 ENCOUNTER — Encounter: Payer: Self-pay | Admitting: Family Medicine

## 2021-02-18 VITALS — BP 125/71 | HR 81 | Temp 98.1°F | Ht 64.0 in | Wt 110.0 lb

## 2021-02-18 DIAGNOSIS — N390 Urinary tract infection, site not specified: Secondary | ICD-10-CM

## 2021-02-18 DIAGNOSIS — R300 Dysuria: Secondary | ICD-10-CM

## 2021-02-18 LAB — POCT URINALYSIS DIPSTICK
Bilirubin, UA: NEGATIVE
Blood, UA: POSITIVE
Glucose, UA: NEGATIVE
Ketones, UA: NEGATIVE
Nitrite, UA: POSITIVE
Protein, UA: POSITIVE — AB
Spec Grav, UA: 1.02 (ref 1.010–1.025)
Urobilinogen, UA: 0.2 E.U./dL
pH, UA: 6 (ref 5.0–8.0)

## 2021-02-18 MED ORDER — CEFTRIAXONE SODIUM 1 G IJ SOLR
1.0000 g | Freq: Once | INTRAMUSCULAR | Status: AC
Start: 2021-02-18 — End: 2021-02-18
  Administered 2021-02-18: 11:00:00 1 g via INTRAMUSCULAR

## 2021-02-18 MED ORDER — CEFDINIR 300 MG PO CAPS: 300.0000 mg | ORAL_CAPSULE | Freq: Two times a day (BID) | ORAL | 0 refills | Status: DC

## 2021-02-18 NOTE — Progress Notes (Signed)
This visit occurred during the SARS-CoV-2 public health emergency.  Safety protocols were in place, including screening questions prior to the visit, additional usage of staff PPE, and extensive cleaning of exam room while observing appropriate contact time as indicated for disinfecting solutions.    Gabrielle [name] , 06/11/1930, 87 y.o., female MRN: [id number] Patient Care Team    Relationship Specialty Notifications Start End  Gabrielle Hillock, DO PCP - General Family Medicine  08/01/15   Gabrielle Seltzer, MD (Inactive) Consulting Physician General Surgery  11/29/13   Magrinat, Virgie Dad, MD Consulting Physician Oncology  11/29/13   Gabrielle Koh, MD (Inactive) Consulting Physician Radiation Oncology  11/29/13   Gabrielle Matin, MD Consulting Physician Dermatology  06/11/15   Gabrielle Males, MD Consulting Physician Pulmonary Disease  06/11/15   Gabrielle Berthold, DO Consulting Physician Neurology  08/01/15   Gabrielle [name] Raspberry, MD Consulting Physician Gastroenterology  12/06/16   Gabrielle Loser, MD Consulting Physician Urology  06/19/19   Gabrielle [name], [facility] Care Management   09/26/20     Chief Complaint  Patient presents with   Urinary Frequency    Pt c/o urine freq, foul odor, urine urgency, urine incontinence, back pain x 2 weeks     Subjective: Pt presents for an OV with complaints of urinary frequency and urgency. She has experienced urinary incontinence and lower back discomfort. She denies fever, chills or hematuria. She is tolerating PO. She was treated with omnicef about 6 weeks ago for UTI. She suffers from mild dementia. She reports her symptoms did resolve after the treatment last time.     AMOX/CLAVULANIC 4  Sensitive     AMPICILLIN >=32  Resistant    AMPICILLIN/SULBACTAM 16  Intermediate    CEFAZOLIN <=4  Not Reportable 1    CEFEPIME <=1  Sensitive    CEFTAZIDIME <=1  Sensitive    CEFTRIAXONE <=1  Sensitive    CIPROFLOXACIN >=4  Resistant     GENTAMICIN >=16  Resistant    IMIPENEM <=0.25  Sensitive    LEVOFLOXACIN >=8  Resistant    NITROFURANTOIN <=16  Sensitive    PIP/TAZO <=4  Sensitive    TOBRAMYCIN 8  Intermediate    TRIMETH/SULFA <=20  Sensitive 2           Depression screen [facility] 2/9 01/05/2021 03/11/2020 08/15/2017 06/10/2017 12/06/2016  Decreased Interest 0 3 0 0 0  Down, Depressed, Hopeless 0 1 0 0 1  PHQ - 2 Score 0 4 0 0 1  Altered sleeping - 0 - - -  Tired, decreased energy - 3 - - -  Change in appetite - 0 - - -  Feeling bad or failure about yourself  - 0 - - -  Trouble concentrating - 3 - - -  Moving slowly or fidgety/restless - 0 - - -  Suicidal thoughts - 0 - - -  PHQ-9 Score - 10 - - -  Difficult doing work/chores - - - - -  Some recent data might be hidden    No Known Allergies Social History   Social History Narrative   [name] lives alone in a [place] home. She has two grown children.   She continues to work part-time for [employer].    She has a grown son-lives in [place] in [place]. [name] is her nephew.   Right handed   One story home   Past Medical History:  Diagnosis Date  Anxiety    Breast cancer ([place]) 2015   right   Breast cancer of upper-outer quadrant of right female breast ([place]) 10/2013   Anastrazole since 12/2013--plan is to take this for 5 yrs.  No evidence of dz recurrence as of 11/2015 oncol f/u.   Cataract    COPD (chronic obstructive pulmonary disease) (HCC)    Lung nodule, solitary 11/21/2013   6 mm lung nodule R lung seen on chest CT.  R breast mass. Disgnostic MMG pending.    Macular degeneration    Osteoporosis 10/2014   T-score -2.8   Pneumonia    hx of    Retina hole    Skin cancer    Stroke ([employer])    Tobacco dependence    Past Surgical History:  Procedure Laterality Date   ABDOMINAL HYSTERECTOMY  1980   (ovaries intact)   [place]   Patient reports approximately 30 years ago having "bladder surgery "in  which she describes a procedure that either stretched her bladder or her urethra dilated secondary to her not being able to void.   BREAST BIOPSY Right    CATARACT EXTRACTION Bilateral    MASTECTOMY Right 2015   MOHS SURGERY     Dr. Sarajane Jews   PARS PLANA VITRECTOMY W/ REPAIR OF MACULAR HOLE Right    RETINAL LASER PROCEDURE     TOTAL MASTECTOMY Right 12/18/2013   Procedure: RIGHT TOTAL MASTECTOMY;  Surgeon: Gabrielle Seltzer, MD;  Location: [facility];  Service: General;  Laterality: Right;   Family History  Problem Relation Age of Onset   Heart attack Mother        Deceased   Cancer Mother        fallopian tube cancer in 71s; deceased 47   Throat cancer Father        Deceased 80; smoker   Diabetes Brother    Prostate cancer Brother 25       Currently 31   Other Brother        Deceased, hemorhhage of pancreas   Leukemia Maternal Aunt    Breast cancer Paternal Aunt        age at diagnosis unknown   Throat cancer Paternal Uncle        Deceased 66s; smoker   Leukemia Paternal Grandmother    Allergies as of 02/18/2021   No Known Allergies      Medication List        Accurate as of February 18, 2021 10:32 AM. If you have any questions, ask your nurse or doctor.          Cholecalciferol 50 MCG (2000 UT) Caps Take 1 capsule (2,000 Units total) by mouth daily.   cyanocobalamin 1000 MCG tablet Take 1,000 mcg by mouth daily.   donepezil 10 MG tablet Commonly known as: ARICEPT Take 1 tablet (10 mg total) by mouth daily.   PRESERVISION AREDS 2 PO Take 1 tablet by mouth 2 (two) times daily.        All past medical history, surgical history, allergies, family history, immunizations andmedications were updated in the EMR today and reviewed under the history and medication portions of their EMR.     ROS Negative, with the exception of above mentioned in HPI   Objective:  BP 125/71    Pulse 81    Temp 98.1 F (36.7 C) (Oral)    Ht 5\' 4"  (1.626 m)    Wt 110 lb (49.9  kg)  SpO2 94%    BMI 18.88 kg/m  Body mass index is 18.88 kg/m.  Physical Exam Vitals and nursing note reviewed.  Constitutional:      General: She is not in acute distress.    Appearance: Normal appearance. She is not ill-appearing, toxic-appearing or diaphoretic.  HENT:     Head: Normocephalic and atraumatic.     Mouth/Throat:     Mouth: Mucous membranes are moist.  Eyes:     General: No scleral icterus.       Right eye: No discharge.        Left eye: No discharge.     Extraocular Movements: Extraocular movements intact.     Conjunctiva/sclera: Conjunctivae normal.     Pupils: Pupils are equal, round, and reactive to light.  Cardiovascular:     Rate and Rhythm: Normal rate and regular rhythm.  Pulmonary:     Effort: Pulmonary effort is normal. No respiratory distress.     Breath sounds: Normal breath sounds. No wheezing, rhonchi or rales.  Musculoskeletal:     Cervical back: Neck supple. No tenderness.     Comments: No cva TTP bilaterally  Lymphadenopathy:     Cervical: No cervical adenopathy.  Neurological:     Mental Status: She is alert and oriented to person, place, and time. Mental status is at baseline.     Motor: No weakness.     Gait: Gait normal.  Psychiatric:        Mood and Affect: Mood normal.        Behavior: Behavior normal.        Thought Content: Thought content normal.        Judgment: Judgment normal.    No results found. No results found. No results found for this or any previous visit (from the past 24 hour(s)).  Assessment/Plan: Gabrielle [name] is a 87 y.o. female present for OV for  Dysuria/Frequent UTI Urine appears infectious again today. Concern she is not taking full course of meds. Asked family to ensure she is receiving full tx at home.  - POCT Urinalysis Dipstick - Urinalysis w microscopic + reflex cultur - rocephin 1g IM today and start omnicef tonight BID for extended course 10 days - f/u prn  Reviewed expectations re: course of  current medical issues. Discussed self-management of symptoms. Outlined signs and symptoms indicating need for more acute intervention. Patient verbalized understanding and all questions were answered. Patient received an After-Visit Summary.    Orders Placed This Encounter  Procedures   Urinalysis w microscopic + reflex cultur   POCT Urinalysis Dipstick   No orders of the defined types were placed in this encounter.  Referral Orders  No referral(s) requested today     Note is dictated utilizing voice recognition software. Although note has been proof read prior to signing, occasional typographical errors still can be missed. If any questions arise, please do not hesitate to call for verification.   electronically signed by:  Howard Pouch, DO  [place]

## 2021-02-18 NOTE — Patient Instructions (Signed)
Hydrate.  Start antibiotic tonight. Take every 12 hours until completed.   Pyelonephritis, Adult Pyelonephritis is an infection that occurs in the kidney. The kidneys are organs that help clean the blood by moving waste out of the blood and into the pee (urine). This infection can happen quickly, or it can last for a long time. In most cases, it clears up with treatment and does not cause other problems. What are the causes? This condition may be caused by: Germs (bacteria) going from the bladder up to the kidney. This may happen after having a bladder infection. Germs going from the blood to the kidney. What increases the risk? This condition is more likely to develop in: Pregnant women. Older people. People who have any of these conditions: Diabetes. Inflammation of the prostate gland (prostatitis), in males. Kidney stones or bladder stones. Other problems with the kidney or the parts of your body that carry pee from the kidneys to the bladder (ureters). Cancer. People who have a small, thin tube (catheter) placed in the bladder. People who are sexually active. Women who use a medicine that kills sperm (spermicide) to prevent pregnancy. People who have had a prior urinary tract infection (UTI). What are the signs or symptoms? Symptoms of this condition include: Peeing often. A strong urge to pee right away. Burning or stinging when peeing. Belly pain. Back pain. Pain in the side (flank area). Fever or chills. Blood in the pee, or dark pee. Feeling sick to your stomach (nauseous) or throwing up (vomiting). How is this treated? This condition may be treated by: Taking antibiotic medicines by mouth (orally). Drinking enough fluids. If the infection is bad, you may need to stay in the hospital. You may be given antibiotics and fluids that are put directly into a vein through an IV tube. In some cases, other treatments may be needed. Follow these instructions at  home: Medicines Take your antibiotic medicine as told by your doctor. Do not stop taking the antibiotic even if you start to feel better. Take over-the-counter and prescription medicines only as told by your doctor. General instructions  Drink enough fluid to keep your pee pale yellow. Avoid caffeine, tea, and carbonated drinks. Pee (urinate) often. Avoid holding in pee for long periods of time. Pee before and after sex. After pooping (having a bowel movement), women should wipe from front to back. Use each tissue only once. Keep all follow-up visits as told by your doctor. This is important. Contact a doctor if: You do not feel better after 2 days. Your symptoms get worse. You have a fever. Get help right away if: You cannot take your medicine or drink fluids as told. You have chills and shaking. You throw up. You have very bad pain in your side or back. You feel very weak or you pass out (faint). Summary Pyelonephritis is an infection that occurs in the kidney. In most cases, this infection clears up with treatment and does not cause other problems. Take your antibiotic medicine as told by your doctor. Do not stop taking the antibiotic even if you start to feel better. Drink enough fluid to keep your pee pale yellow. This information is not intended to replace advice given to you by your health care provider. Make sure you discuss any questions you have with your health care provider. Document Revised: 09/25/2020 Document Reviewed: 09/25/2020 Elsevier Patient Education  [place].

## 2021-02-18 NOTE — Addendum Note (Signed)
Addended by: Kavin Leech on: 02/18/2021 11:20 AM   Modules accepted: Orders

## 2021-02-21 LAB — URINALYSIS W MICROSCOPIC + REFLEX CULTURE
Bilirubin Urine: NEGATIVE
Glucose, UA: NEGATIVE
Hgb urine dipstick: NEGATIVE
Ketones, ur: NEGATIVE
Nitrites, Initial: POSITIVE — AB
RBC / HPF: NONE SEEN /HPF (ref 0–2)
Specific Gravity, Urine: 1.016 (ref 1.001–1.035)
WBC, UA: >=60 /HPF — AB (ref 0–5)
pH: 5.5 (ref 5.0–8.0)

## 2021-02-21 LAB — URINE CULTURE
MICRO NUMBER:: 12789735
SPECIMEN QUALITY:: ADEQUATE

## 2021-02-21 LAB — CULTURE INDICATED

## 2021-03-05 DIAGNOSIS — Z20822 Contact with and (suspected) exposure to covid-19: Secondary | ICD-10-CM | POA: Diagnosis not present

## 2021-03-10 ENCOUNTER — Other Ambulatory Visit: Payer: Self-pay

## 2021-03-10 ENCOUNTER — Ambulatory Visit: Payer: Self-pay

## 2021-03-10 NOTE — Patient Outreach (Signed)
Gabrielle [name] - [name]) Care Management  03/10/2021  LAIYLA [name] 1930/11/30 [id number]   Telephone Assessment Case Closure   Voicemail message received from son-[name]. Return call to son. He confirms that they were able to get patient moved into [facility] on 01/28/21. Patient is actually rooming with her 86 yr old brother who is also no longer able to live on his own. Son states that patient is adjusting and they are pleased with the care she is getting. Advised son that case would be closed as patient now resident at [name]. He voiced understanding and appreciation.    Plan: RN CM will close case at this time.  [name], RN,BSN,CCM [facility] Telephonic Care Management Coordinator Direct Phone: [phone] Toll Free: [phone] Fax: [phone]

## 2021-03-10 NOTE — Patient Outreach (Signed)
[facility]) Care Management  03/10/2021  Gabrielle [name] 29-Mar-1930 [id number]   Telephone Assessment   Outreach attempt to patient/caregiver.  No answer. RN CM left HIPAA compliant voicemail message along with contact info.      Plan: Assigned RN CM will make outreach attempt to patient/caregiver within the month of March if no return call.  [name], RN,BSN,CCM [facility] Telephonic Care Management Coordinator Direct Phone: [phone] Toll Free: [phone] Fax: [phone]

## 2021-03-17 ENCOUNTER — Telehealth: Payer: Self-pay | Admitting: Internal Medicine

## 2021-03-17 ENCOUNTER — Other Ambulatory Visit: Payer: Self-pay

## 2021-03-17 ENCOUNTER — Encounter: Payer: Self-pay | Admitting: Internal Medicine

## 2021-03-17 ENCOUNTER — Ambulatory Visit: Payer: Medicare PPO | Admitting: Internal Medicine

## 2021-03-17 VITALS — BP 108/60 | HR 89 | Temp 97.8°F | Ht 64.0 in | Wt 117.4 lb

## 2021-03-17 DIAGNOSIS — Z87891 Personal history of nicotine dependence: Secondary | ICD-10-CM

## 2021-03-17 DIAGNOSIS — Z7184 Encounter for health counseling related to travel: Secondary | ICD-10-CM

## 2021-03-17 DIAGNOSIS — R54 Age-related physical debility: Secondary | ICD-10-CM | POA: Diagnosis not present

## 2021-03-17 DIAGNOSIS — J449 Chronic obstructive pulmonary disease, unspecified: Secondary | ICD-10-CM | POA: Diagnosis not present

## 2021-03-17 DIAGNOSIS — R5381 Other malaise: Secondary | ICD-10-CM | POA: Diagnosis not present

## 2021-03-17 MED ORDER — YUPELRI 175 MCG/3ML IN SOLN: 175.0000 ug | Freq: Every day | RESPIRATORY_TRACT | 0 refills | Status: DC

## 2021-03-17 NOTE — Telephone Encounter (Signed)
Patient was seen by MR today. He has requested a follow up in the next few weeks after completing the CT, ONO and PFT. Patient's son wanted to hold off on schedulng due to him not having his calendar with him. He will call us back to her scheduled.

## 2021-03-17 NOTE — Patient Instructions (Addendum)
ICD-10-CM   1. COPD, moderate ([place])  J44.9     2. Physical deconditioning  R53.81     3. History of smoking greater than 50 pack years  Z87.891     4. Frailty  R54     5. Travel advice encounter  Z71.84        STart yupelri nebulizer once daily -  Start albuterol nebulizer as neeed Do HRCT supine and prone Do ONO test on room air Test blood for alpha 1 phenotype 03/17/2021 or next visit Do spirometry and dlco next visit Consider home physical therapy Assess fitness for travel to  [place] after above but regardless of travel advicee, recommend you do above   Followup  - next few weeks to few months to see Dr Chase Caller for followup

## 2021-03-17 NOTE — Progress Notes (Signed)
Gabrielle [name] is a 88 y.o. female current every day  smoker  with pneumonia and COPD. She is followed by Dr. Birdena Jubilee.    IOV 12/07/2013  Chief Complaint  Patient presents with   Pulmonary Consult    Pt referred by Dr. Cathie Olden for COPD.     88 year old female. 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 [place] aunt of GI doc [name]. Dr Hilarie Fredrickson and daughter [name] had briefed me about patient prior to visit.  She has had 50# weight loss per hx x 12 months (per [name] notes 12/05/13 no documented loss per epic) or so with some general decline in ? Cognition.  She saw cardiology 11/07/13 and cxr showed emphysema. Resulted in CT Chest 11/19/13 that showed  - 1cm Rt breast mass    - subseuqntly seen [name] 12/05/13 :  triple postive breast cancer - await surgery    - per Daughter's email to me   Yesterday     Mom has invasive ductal breast cancer Tumor is 2.1-3.0 cm cancer is grade 2, stage 1B to stage 2. Proliferation factor 31% no evidence cancer has spread to lymph nodes it is estrogen, progesterone and HER2 positive. Current treatment plan:  mastectomy of right breast, followed by Anastrozole and Herceptin.   Prognosis:  the physicians believe there is a high probability the surgery and medications will cure the cancer.      - 48m RLL nodule  - severe emphysema +  So she has been referred here  She says that leading up to this fall 2015 - absolutely no dyspnea, cough, chest tightness. But now some cough with nasal congestion. In fact, COPD CAT Score below shows mild cough and sputum with very little dyspnea. Fatigue seeems main issue. COPD CAT score is 14 and reflects only mild symptoms burden.   Walk test 185 feet x 3 laps: did not desaturate and denies dysppnea  sPirometry today: fev1 1.5L/74%, Ratio 63  cw  MILDER FORM OF GOLD STAGE 2 COPD   Daughter has sseveral question on email   - addressed in plan   OV 09/23/2016   Chief Complaint  Patient  presents with   Follow-up    Pt last seen in 11/2013. Pt here today for abnormal CT chest. Dr. AHavery Morosadvised pt to f/u back up with pulmonary. Pt c/o DOE, prod cough with clear mucus. Pt denies CP/tightness and f/c/s.     .88year old female. Accompanied by her daughter TOlivia Mackie Patient lives alone. Last seen almost 3 years ago for COPD Gold stage II and ongoing smoking and preoperative clearance and unexplained weight loss. Since then she's not followed up. According to the daughter and patient is continued to lose weights several pounds since then. It has accelerated in the last several months. Apparently an extensive workup was done and a CT scan a week ago 09/15/2016 showed left lower lobe pneumonia and pulmonary hypertension therefore they've back here. Patient continues to smoke. She reports ongoing weight loss. Ongoing cough and shortness of breath. She is no longer taking her Spiriva. The cough might be worse according to the daughter, the patient is unsure. Patient continues to live alone and take care of herself. Patient family is trying to get her into an assisted living but she is resisting. Continues to smoke and will not quit. There is no report of dysphagia and apparently GI has cleared up. Her last visit to the Dentists was over  a year ago. Patient denies any aspiration episodes or alcohol consumption.        10/18/2016 3 week Follow Up Visit: Pt. Returns for follow up. She was seen by Dr. Chase Caller for pneumonia,COPD, 09/23/2016. She had a significant weight loss in the past few months. ColoScreen was positive for blood, and PCP refer to GI.  GI consult included CT scan of abdomen and pelvis and chest. CT chest had an incidental finding of aspiration pneumonia. She was referred to Dr. Chase Caller  for evaluation. Plan after that visit was as follows:  COPD  -please restart spiriva respimat; wil help with cough - will discuss repeat PFT at followup   Pneumonia  - likely aspiration  or due to poor dental hygienie  - continue augmentin as advised by GI but extend for 4 more days for total 14 days (will send Rx)  - talk to Dr Havery Moros if you could have swallowing difficulty  - definitely see dentist  - do followup CT chest wo contrast in 8 weeks   Smoking   Understand you enjoy it and wont quit   Followup  3 weeks with APP to report progress  8-10  weeks with me Dr Chase Caller but after CT chest/ Abdomen/ Pelvis.  Today's Follow Up:  Pt. Presents today stating she is much better after the Augmentin. Additionally after starting the Spiriva her cough has resolved. She denies fever, chest pain, orthopnea, or hemoptysis. Per the patient her secretions are clear to white. Per Dr. Golden Pop suggestion the patient has been seen by a dentist, who felt there was no acute oral /dental  issues that could be causing a pneumonia. They have made recommendation that she have periodontal follow-up. Patient states she does not feel that she is aspirating, as she has no coughing while eating or drinking. We discussed the possibility of silent aspiration and that the only true evaluation would be a modified barium swallow. She prefers to wait until after the CT scan scheduled for 11/24/2016 to determine if she needs swallow evaluation.Patient weight today is 100 pounds. She states she is using meal supplements at bedtime.   OV 11/29/2016  Chief Complaint  Patient presents with   Follow-up    Pt still has occ. SOB but not necessarily just with exertion and occ. cough with clear mucus. Denies any CP.     Gabrielle [name] is the aunt of GI Dr Billie Lade.  she presents for several issues.   COPD: She's not taking her Spiriva inhaler  Daughter [name] with her. sHe tells me that during switch pharmacies the Spiriva that left out.   COPD is considered stable    smoking: She continues to smoke she enjoys it. Refuses to quit  - Lower lobe pneumonia: She had follow-up CT scan of the chest that  shows significant improvement since July 2018. She still has some nodularity. She will need a repeat CT chest. She again denies any aspiration. The CT scan does show esophageal dysmotility. She will follow-up with GI. Daughter is asking about safety of doing endoscopy and colonoscopy.   -   OV 06/02/2017  Chief Complaint  Patient presents with   Follow-up    Pt here after CT chest. Pt states overall she feels her breathing is stable. Pt c/o prod cough with yellow mucus x months. Pt denies CP/tightness and f/c/s.      Gabrielle [name] is the [place] of DR [employer].    Fu copd and pulmonary infitlrate/nodule after  pna. LAst visit July 2018. Overal lstable. Was nto taking spiriva because [facility] was not delivering consistently to her (wrong name, address etc.,)Now back on spiriva though [place] and going to start. STabe. COntinues to smoker. CAT scorfe below shws stabiluty., Daughter tracy from [facility] here    Ct Chest Wo Contrast  Result Date: 06/01/2017 CLINICAL DATA:  Left lower lobe pneumonia. EXAM: CT CHEST WITHOUT CONTRAST TECHNIQUE: Multidetector CT imaging of the chest was performed following the standard protocol without IV contrast. COMPARISON:  11/24/2016. FINDINGS: Cardiovascular: The heart size is normal. No pericardial effusion. Coronary artery calcification is evident. Atherosclerotic calcification is noted in the wall of the thoracic aorta. Mediastinum/Nodes: Stable 10 mm short axis precarinal lymph node. Calcified lymph nodes seen in the left hilum. The esophagus has normal imaging features. There is no axillary lymphadenopathy. Lungs/Pleura: Centrilobular and paraseptal emphysema again noted. Patchy airspace disease seen previously posterior right upper lobe has clearly improved in the interval with some residual tree-in-bud nodularity, bronchiectasis, and small airway impaction identified posteriorly along the major fissure. Areas of mild bronchiectasis, airway impaction,  and scarring in the right middle lobe are stable. There is bronchial wall thickening with mild bronchiectasis in the lower lungs bilaterally. 4 mm right lower lobe nodule (image 104/series 3) is stable since prior study and also comparing to 06/27/2014 consistent with benign etiology. Stable calcified granuloma in the lingula. Upper Abdomen: Stable adrenal thickening bilaterally, compatible with hyperplasia. Granulomas disease again noted in the spleen. Musculoskeletal: Bone windows reveal no worrisome lytic or sclerotic osseous lesions. IMPRESSION: 1. Continued interval improvement in multifocal pulmonary opacities. Residua identified in the posterior right upper lobe, right middle lobe, and to a modest degree in each lower lobe may reflect incomplete resolution, evolving scar, or recurrent disease. Features may be related to atypical infection although aspiration can have this appearance. 2.  Emphysema. (ICD10-J43.9) 3.  Aortic Atherosclerois (ICD10-170.0) Electronically Signed   By: Misty Stanley M.D.   On: 06/01/2017 15:17    OV 03/17/2021  Subjective:  Patient ID: Gabrielle [name], female , DOB: 03-30-1930 , age 32 y.o. , MRN: [id number] , ADDRESS: [place] [address] PCP Ma Hillock, DO Patient Care Team: Ma Hillock, DO as PCP - General (Family Medicine) Excell Seltzer, MD (Inactive) as Consulting Physician (General Surgery) Magrinat, Virgie Dad, MD as Consulting Physician (Oncology) Arloa Koh, MD (Inactive) as Consulting Physician (Radiation Oncology) Jarome Matin, MD as Consulting Physician (Dermatology) Brand Males, MD as Consulting Physician (Pulmonary Disease) Alda Berthold, DO as Consulting Physician (Neurology) Armbruster, Carlota Raspberry, MD as Consulting Physician (Gastroenterology) Bjorn Loser, MD as Consulting Physician (Urology)  This Provider for this visit: Treatment Team:  Attending Provider: Brand Males, MD    03/17/2021 -new visit because  she is reestablishing after 3 years.  [place] of Dr. Ernest Mallick and Dr. Billie Lade. Chief Complaint  Patient presents with   Consult    Pt is here based off of a referral from PCP to get re-established with Dr. Chase Caller.  Pt states that she does become SOB with exertion and is also tired a lot.     HPI KAJAL SCALICI 88 y.o. -presents with her son Gabrielle [name].  I have not seen her since 2019.  Her children I cannot afford the son who takes care of logistics.  Daughter [name] who lives in [place] takes care of medication decisions.  Patient tells me that she has been doing well for the  last 3 years since she last saw me.  However in talking to the son the story somewhat different.  She continues to smoke.  [place]  However over the time the son started noticing decline in functional status and increased self-neglect.  Also failure to thrive.  She was eating less.  She was losing weight.  There was hoarding in the house.  Decreased self-care.  She was also beginning to fall.  1 day she had fallen and was not found for 17 hours.  At this point in time they moved her to [facility].  She has a living brother who is living in [facility] at that time.  He is in his 79s.  They relocated him because he was also alone.  They now live together in a two-bedroom apartment at [facility].  The brother and sister like to travel the world.  At [facility] they monitor and supervise the medications and also give patient meals.  After that she has gained weight and she is feeling better.  She still able to walk without like a cane.  She does have access to a cane and a walker but she is not needing it.  When she walks and her to change she does get exhausted and short of breath but apparently according to her it only bothers her somewhat.  She also has a chronic cough but again it only bothers her somewhat.  In the  interim there is also diagnosed of mild vascular dementia.   Is a  Is been reluctant to undergo testing and treatment.  Discussed options of pulmonary function testing and nebulizers and overnight oxygen study and physical therapy.  She remained largely silent about these questions.  In the past we have discussed about quitting smoking but she is not interested.  This time I did not address the issue directly.  She did express a keen desire to travel to [facility].  The elevation here is 8000 feet.  She says that she went to [place] in [place] just a few years ago and it was 14,000 feet.  However according to the son she went to [place] last more than a decade ago.  I did indicate to her that travel at her age can be challenging.  She wants to make this trip with her 65 year old brother.  Did tell her that in this case we would need to go through these interventions including definitively.  At this point in time she reflected on it and felt she would postpone her trip for 1 or 2 years.  I did remind her that regardless of the trip that I would recommend these interventions.  The trip would be the extra motivation factor.  She seemed to agree to the recommendations.  CAT COPD Symptom & Quality of Life Score (GSK trademark) 0 is no burden. 5 is highest burden 12/07/2013  06/02/2017   Never Cough -> Cough all the time 3 3  No phlegm in chest -> Chest is full of phlegm 4 2  No chest tightness -> Chest feels very tight 0 1  No dyspnea for 1 flight stairs/hill -> Very dyspneic for 1 flight of stairs 2 Did nto answer  No limitations for ADL at home -> Very limited with ADL at home 0 0  Confident leaving home -> Not at all confident leaving home 0 0  Sleep soundly ->  Do not sleep soundly because of lung condition 0 0  Lots of Energy -> No energy at all 5 5  TOTAL Score (max 40)  14     PFT  No flowsheet data found.     has a past medical history of Anxiety, Breast cancer ([place]) (2015),  Breast cancer of upper-outer quadrant of right female breast ([place]) (10/2013), Cataract, COPD (chronic obstructive pulmonary disease) ([place]), Lung nodule, solitary (11/21/2013), Macular degeneration, Osteoporosis (10/2014), Pneumonia, Retina hole, Skin cancer, Stroke ([place]), and Tobacco dependence.   reports that she has been smoking cigarettes. She has a 60.00 pack-year smoking history. She has never used smokeless tobacco.  Past Surgical History:  Procedure Laterality Date   ABDOMINAL HYSTERECTOMY  1980   (ovaries intact)   [place]   Patient reports approximately 30 years ago having "bladder surgery "in which she describes a procedure that either stretched her bladder or her urethra dilated secondary to her not being able to void.   BREAST BIOPSY Right    CATARACT EXTRACTION Bilateral    MASTECTOMY Right 2015   MOHS SURGERY     Dr. Sarajane Jews   PARS PLANA VITRECTOMY W/ REPAIR OF MACULAR HOLE Right    RETINAL LASER PROCEDURE     TOTAL MASTECTOMY Right 12/18/2013   Procedure: RIGHT TOTAL MASTECTOMY;  Surgeon: Excell Seltzer, MD;  Location: [facility];  Service: General;  Laterality: Right;    No Known Allergies  Immunization History  Administered Date(s) Administered   Fluad Quad(high Dose 65+) 12/01/2018, 02/20/2020, 01/05/2021   Influenza, High Dose Seasonal PF 11/29/2016, 12/27/2017   Influenza,inj,Quad PF,6+ Mos 11/21/2013, 12/19/2014   PFIZER(Purple Top)SARS-COV-2 Vaccination 04/07/2019, 04/28/2019, 12/06/2019   PPD Test 09/20/2016   Pneumococcal Conjugate-13 11/21/2013   Pneumococcal Polysaccharide-23 11/29/2016   Td 12/21/2016   Zoster Recombinat (Shingrix) 03/18/2017, 06/22/2017    Family History  Problem Relation Age of Onset   Heart attack Mother        Deceased   Cancer Mother        fallopian tube cancer in 94s; deceased 86   Throat cancer Father        Deceased 22; smoker   Diabetes Brother    Prostate cancer Brother 38        Currently 69   Other Brother        Deceased, hemorhhage of pancreas   Leukemia Maternal Aunt    Breast cancer Paternal Aunt        age at diagnosis unknown   Throat cancer Paternal Uncle        Deceased 68s; smoker   Leukemia Paternal Grandmother      Current Outpatient Medications:    albuterol (PROVENTIL) (2.5 MG/3ML) 0.083% nebulizer solution, Take 3 mLs (2.5 mg total) by nebulization every 6 (six) hours as needed for wheezing or shortness of breath., Disp: 75 mL, Rfl: 12   Cholecalciferol 2000 units CAPS, Take 1 capsule (2,000 Units total) by mouth daily., Disp: 90 each, Rfl: 1   cyanocobalamin 1000 MCG tablet, Take 1,000 mcg by mouth daily., Disp: , Rfl:    donepezil (ARICEPT) 10 MG tablet, Take 1 tablet (10 mg total) by mouth daily., Disp: 90 tablet, Rfl: 3   Multiple Vitamins-Minerals (PRESERVISION AREDS 2 PO), Take 1 tablet by mouth 2 (two) times daily., Disp: , Rfl:    revefenacin (YUPELRI) 175 MCG/3ML nebulizer solution, Take 3 mLs (175 mcg total) by nebulization daily., Disp: 21 mL, Rfl: 0  Objective:   Vitals:   03/17/21 1559  BP: 108/60  Pulse: 89  Temp: 97.8 F (36.6 C)  TempSrc: Oral  SpO2: 96%  Weight: 117 lb 6.4 oz (53.3 kg)  Height: '5\' 4"'  (1.626 m)    Estimated body mass index is 20.15 kg/m as calculated from the following:   Height as of this encounter: '5\' 4"'  (1.626 m).   Weight as of this encounter: 117 lb 6.4 oz (53.3 kg).  '@WEIGHTCHANGE' @  [employer]   03/17/21 1559  Weight: 117 lb 6.4 oz (53.3 kg)     Physical Exam  General: No distress.  Frail elderly female.  Somewhat kyphotic.  Sitting comfortably.  Mild smell of tobacco.  Looks deconditioned. Neuro: Alert and Oriented x 3. GCS 15. Speech normal Psych: Pleasant Resp:  Barrel Chest -mildly yes.  Wheeze - no, Crackles - no, No overt respiratory distress CVS: Normal heart sounds. Murmurs - no Ext: Stigmata of Connective Tissue Disease - no HEENT: Normal upper airway. PEERL +. No  post nasal drip        Assessment:       ICD-10-CM   1. COPD, moderate (HCC)  J44.9 Pulse oximetry, overnight    Alpha-1 antitrypsin phenotype    Pulmonary function test    CT Chest High Resolution    2. Physical deconditioning  R53.81     3. History of smoking greater than 50 pack years  Z87.891 Pulmonary function test    CT Chest High Resolution    4. Frailty  R54     5. Travel advice encounter  Z71.84          Plan:     Patient Instructions     ICD-10-CM   1. COPD, moderate ([place])  J44.9     2. Physical deconditioning  R53.81     3. History of smoking greater than 50 pack years  Z87.891     4. Frailty  R54     5. Travel advice encounter  Z71.84        STart yupelri nebulizer once daily -  Start albuterol nebulizer as neeed Do HRCT supine and prone Do ONO test on room air Test blood for alpha 1 phenotype 03/17/2021 or next visit Do spirometry and dlco next visit Consider home physical therapy Assess fitness for travel to  [facility] after above but regardless of travel advicee, recommend you do above   Followup  - next few weeks to few months to see Dr Chase Caller for followup    SIGNATURE    Dr. Brand Males, M.D., F.C.C.P,  Pulmonary and Critical Care Medicine Staff Physician, [place] Director - Interstitial Lung Disease  Program  Pulmonary [place] [facility] [place], [place], [address]  Pager: [phone], If no answer or between  15:00h - 7:00h: call [phone] Telephone: [phone]  5:23 PM 03/17/2021

## 2021-03-23 ENCOUNTER — Telehealth: Payer: Self-pay | Admitting: Internal Medicine

## 2021-03-23 DIAGNOSIS — J449 Chronic obstructive pulmonary disease, unspecified: Secondary | ICD-10-CM

## 2021-03-23 NOTE — Telephone Encounter (Signed)
CALLED AND SPOKE WITH SON BUT HE STATED HE WAS SITTING DOWN TO DINNER AND FOR [place] TO CALL BACK TOMORROW. WILL ATTEMPT AGAIN TOMORROW

## 2021-03-25 NOTE — Telephone Encounter (Signed)
Called and spoke with [name] who states that she has some question about her mother's recent OV visit. We went over the plan that is on the AVS and covered everything on there. I am checking on the ONO to see if it has been scheduled. Called and left Danielle with [facility] a message, will wait to hear back from her

## 2021-03-25 NOTE — Telephone Encounter (Signed)
Spoke with Andee Poles and she said that the patient is scheduled for ONO for tomorrow and that the driver brings the machine and drops it off and then picks it back up. Advised her that the patient lives in an assisted living facility so they will need to make sure they go over everything with the patient as well as the staff incase there are any questions. She said she made a note on the order. Called and informed daughter of this information. Nothing further needed at this time.

## 2021-03-26 NOTE — Addendum Note (Signed)
Addended by: Lorretta Harp on: 03/26/2021 04:34 PM   Modules accepted: Orders

## 2021-03-26 NOTE — Telephone Encounter (Signed)
Called and spoke with pt's daughter Gabrielle [name] who stated that pt had the Yupelri neb sol but does not have a nebulizer. Stated to her that we would place an order for pt to receive a nebulizer and she verbalized understanding. Also Gabrielle [name] said that pt has not received the albuterol neb sol and I stated to her that that Rx was sent to [facility]. Provided Gabrielle [name] with the phone number for [facility] so she could call to check status of pt receiving the neb sol. Nothing further needed.

## 2021-03-31 ENCOUNTER — Telehealth: Payer: Self-pay | Admitting: Internal Medicine

## 2021-04-01 NOTE — Telephone Encounter (Signed)
LMTCB for Gabrielle [name] who is listed on DPR  The pt should be getting a call from [facility] to schedule ONO

## 2021-04-01 NOTE — Telephone Encounter (Signed)
Spoke with the pt's daughter  She says that [facility] has not called them to schedule ONO- I gave them her phone number and she will call them  She says that there is some confusion bc we sent neb machine order to [facility] and also [facility]  I have called [facility] and they do have her neb order as well as meds  I advised to contact the daughter, [name], to talk about pricing  We will cancel neb order with [facility]   LMTCB for [name] to make sure she is okay with all of this

## 2021-04-01 NOTE — Telephone Encounter (Signed)
Gabrielle [name] calling back. Informed her that [facility] should be calling and she said they were supposed to deliver last week, and she hasnt heard from them. she also has questions regarding the medications for nebulizer. She states that pt DOES NOT have a nebulizer. States that [facility] should include nebulizer and albuterol solution. Gabrielle [name] feels like a lot of wires are being crossed and worries that pt will now be getting two nebulizers instead of just one. Please advise.

## 2021-04-03 NOTE — Telephone Encounter (Signed)
Called and spoke with Gabrielle [name] and she stated she had no questions at this time. Nothing further needed at this time

## 2021-04-03 NOTE — Telephone Encounter (Signed)
Lm x2 for Gabrielle [name]. Will close encounter per office protocol.

## 2021-04-07 ENCOUNTER — Other Ambulatory Visit: Payer: Self-pay

## 2021-04-07 ENCOUNTER — Ambulatory Visit (INDEPENDENT_AMBULATORY_CARE_PROVIDER_SITE_OTHER)
Admission: RE | Admit: 2021-04-07 | Discharge: 2021-04-07 | Disposition: A | Discharge status: Home / Self Care | Payer: Medicare PPO | Source: Ambulatory Visit | Attending: Internal Medicine | Admitting: Internal Medicine

## 2021-04-07 DIAGNOSIS — J432 Centrilobular emphysema: Secondary | ICD-10-CM | POA: Diagnosis not present

## 2021-04-07 DIAGNOSIS — R918 Other nonspecific abnormal finding of lung field: Secondary | ICD-10-CM | POA: Diagnosis not present

## 2021-04-07 DIAGNOSIS — I700 Atherosclerosis of aorta: Secondary | ICD-10-CM | POA: Diagnosis not present

## 2021-04-07 DIAGNOSIS — Z87891 Personal history of nicotine dependence: Secondary | ICD-10-CM

## 2021-04-07 DIAGNOSIS — J449 Chronic obstructive pulmonary disease, unspecified: Secondary | ICD-10-CM | POA: Diagnosis not present

## 2021-04-07 DIAGNOSIS — I2510 Atherosclerotic heart disease of native coronary artery without angina pectoris: Secondary | ICD-10-CM | POA: Diagnosis not present

## 2021-04-08 ENCOUNTER — Inpatient Hospital Stay: Admission: RE | Admit: 2021-04-08 | Payer: Medicare PPO | Source: Ambulatory Visit

## 2021-04-08 ENCOUNTER — Telehealth: Payer: Self-pay | Admitting: Family Medicine

## 2021-04-08 NOTE — Telephone Encounter (Signed)
Form has been previously faxed multiple times. Placed in mail today as well

## 2021-04-08 NOTE — Telephone Encounter (Signed)
Pt son Danel Studzinski, came in on 04/08/2021 with paperwork for his mother.  Pt is in a care facility, they are needing paperwork on pt from Provider.  Paperwork placed in Dr. Raliegh Ip folder upfront.-KR

## 2021-05-11 DIAGNOSIS — J069 Acute upper respiratory infection, unspecified: Secondary | ICD-10-CM | POA: Diagnosis not present

## 2021-05-13 ENCOUNTER — Telehealth: Payer: Self-pay

## 2021-05-13 NOTE — Telephone Encounter (Signed)
Called pt to schedule AWV. Please schedule with health coach, Toria or Azaiah Licciardi. ? ?

## 2021-08-24 ENCOUNTER — Telehealth: Payer: Self-pay

## 2021-08-24 NOTE — Telephone Encounter (Signed)
Spoke with daughter and pt is now living in assisted living and her primary care was supposed to have been transferred to the in house provider. Daughter states that they still want to keep Dr. Claiborne Billings as PCP on chart at the moment because they do not know if they are going to stick with the in house provider. AWV is declined at this time.

## 2021-09-23 ENCOUNTER — Telehealth: Payer: Self-pay | Admitting: Internal Medicine

## 2021-09-23 ENCOUNTER — Other Ambulatory Visit: Payer: Self-pay | Admitting: Internal Medicine

## 2021-09-23 DIAGNOSIS — U071 COVID-19: Secondary | ICD-10-CM

## 2021-09-23 MED ORDER — MOLNUPIRAVIR 200 MG PO CAPS: 4.0000 | ORAL_CAPSULE | Freq: Two times a day (BID) | ORAL | 0 refills | Status: AC

## 2021-09-23 NOTE — Telephone Encounter (Signed)
I called the patient daughter and the patient PCP has an OV scheduled with the provider in 10/2021. Daughter is aware. Nothing further needed.

## 2021-09-23 NOTE — Progress Notes (Signed)
Covid-19 infection Molnupiravir course

## 2021-09-24 DIAGNOSIS — U071 COVID-19: Secondary | ICD-10-CM | POA: Diagnosis not present

## 2021-09-28 ENCOUNTER — Other Ambulatory Visit: Payer: Self-pay | Admitting: Family Medicine

## 2021-09-28 ENCOUNTER — Other Ambulatory Visit: Payer: Self-pay | Admitting: Neurology

## 2021-11-03 NOTE — Progress Notes (Signed)
IMPRESSION: 1. No evidence of interstitial lung disease or air trapping. 2. Left lower lobe bronchial debris. 3. Aortic atherosclerosis (ICD10-I70.0). Coronary artery calcification. 4.  Emphysema (ICD10-J43.9).   Electronically Signed   By: Lorin Picket M.D.   On: 04/08/2021 12:42   Will discuss sept 2023 visit

## 2021-11-12 ENCOUNTER — Telehealth: Payer: Self-pay

## 2021-11-12 NOTE — Telephone Encounter (Signed)
Pt is no longer under [facility]. Pcp removed on 09/01/21.

## 2021-11-17 ENCOUNTER — Ambulatory Visit (INDEPENDENT_AMBULATORY_CARE_PROVIDER_SITE_OTHER): Payer: Medicare HMO | Admitting: Internal Medicine

## 2021-11-17 ENCOUNTER — Encounter: Payer: Self-pay | Admitting: Internal Medicine

## 2021-11-17 VITALS — BP 116/68 | HR 107 | Temp 98.7°F | Ht 64.0 in | Wt 123.2 lb

## 2021-11-17 DIAGNOSIS — Z87891 Personal history of nicotine dependence: Secondary | ICD-10-CM | POA: Diagnosis not present

## 2021-11-17 DIAGNOSIS — J449 Chronic obstructive pulmonary disease, unspecified: Secondary | ICD-10-CM | POA: Diagnosis not present

## 2021-11-17 NOTE — Patient Instructions (Addendum)
ICD-10-CM   1. COPD, moderate ([place])  J44.9     2. History of smoking greater than 50 pack years  Z87.891      Stable disease but looking for more symptom relief  Plan Continue  yupelri nebulizer once daily  Start albuterol neb once daily and also continue prn; if this works can look at long acting [place] on room air Test blood for alpha 1 phenotype  11/17/2021 ; genetic test for copd Respiratory vaccines - flu, covid, RSV recommended  Followup  - 8 weeks to see Dr Chase Caller for followup; face to face

## 2021-11-17 NOTE — Progress Notes (Signed)
Gabrielle [name] is a 88 y.o. female current every day  smoker  with pneumonia and COPD. She is followed by Dr. Birdena Jubilee.    IOV 12/07/2013  Chief Complaint  Patient presents with   Pulmonary Consult    Pt referred by Dr. Cathie Olden for COPD.     88 year old female. 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 Gabrielle [name] aunt of GI doc [name]. Dr Hilarie Fredrickson and daughter Gabrielle [name] had briefed me about patient prior to visit.  She has had 50# weight loss per hx x 12 months (per [name] notes 12/05/13 no documented loss per epic) or so with some general decline in ? Cognition.  She saw cardiology 11/07/13 and cxr showed emphysema. Resulted in CT Chest 11/19/13 that showed  - 1cm Rt breast mass    - subseuqntly seen [name] 12/05/13 :  triple postive breast cancer - await surgery    - per Daughter's email to me   Yesterday     Mom has invasive ductal breast cancer Tumor is 2.1-3.0 cm cancer is grade 2, stage 1B to stage 2. Proliferation factor 31% no evidence cancer has spread to lymph nodes it is estrogen, progesterone and HER2 positive. Current treatment plan:  mastectomy of right breast, followed by Anastrozole and Herceptin.   Prognosis:  the physicians believe there is a high probability the surgery and medications will cure the cancer.      - 17m RLL nodule  - severe emphysema +  So she has been referred here  She says that leading up to this fall 2015 - absolutely no dyspnea, cough, chest tightness. But now some cough with nasal congestion. In fact, COPD CAT Score below shows mild cough and sputum with very little dyspnea. Fatigue seeems main issue. COPD CAT score is 14 and reflects only mild symptoms burden.   Walk test 185 feet x 3 laps: did not desaturate and denies dysppnea  sPirometry today: fev1 1.5L/74%, Ratio 63  cw  MILDER FORM OF GOLD STAGE 2 COPD   Daughter has sseveral question on email   - addressed in plan   OV 09/23/2016   Chief Complaint  Patient  presents with   Follow-up    Pt last seen in 11/2013. Pt here today for abnormal CT chest. Dr. AHavery Morosadvised pt to f/u back up with pulmonary. Pt c/o DOE, prod cough with clear mucus. Pt denies CP/tightness and f/c/s.     .88year old female. Accompanied by her daughter TOlivia [name] Patient lives alone. Last seen almost 3 years ago for COPD Gold stage II and ongoing smoking and preoperative clearance and unexplained weight loss. Since then she's not followed up. According to the daughter and patient is continued to lose weights several pounds since then. It has accelerated in the last several months. Apparently an extensive workup was done and a CT scan a week ago 09/15/2016 showed left lower lobe pneumonia and pulmonary hypertension therefore they've back here. Patient continues to smoke. She reports ongoing weight loss. Ongoing cough and shortness of breath. She is no longer taking her Spiriva. The cough might be worse according to the daughter, the patient is unsure. Patient continues to live alone and take care of herself. Patient family is trying to get her into an assisted living but she is resisting. Continues to smoke and will not quit. There is no report of dysphagia and apparently GI has cleared up. Her last visit to the Dentists was over a  year ago. Patient denies any aspiration episodes or alcohol consumption.        10/18/2016 3 week Follow Up Visit: Pt. Returns for follow up. She was seen by Dr. Chase Caller for pneumonia,COPD, 09/23/2016. She had a significant weight loss in the past few months. ColoScreen was positive for blood, and PCP refer to GI.  GI consult included CT scan of abdomen and pelvis and chest. CT chest had an incidental finding of aspiration pneumonia. She was referred to Dr. Chase Caller  for evaluation. Plan after that visit was as follows:  COPD  -please restart spiriva respimat; wil help with cough - will discuss repeat PFT at followup   Pneumonia  - likely aspiration  or due to poor dental hygienie  - continue augmentin as advised by GI but extend for 4 more days for total 14 days (will send Rx)  - talk to Dr Havery Moros if you could have swallowing difficulty  - definitely see dentist  - do followup CT chest wo contrast in 8 weeks   Smoking   Understand you enjoy it and wont quit   Followup  3 weeks with APP to report progress  8-10  weeks with me Dr Chase Caller but after CT chest/ Abdomen/ Pelvis.  Today's Follow Up:  Pt. Presents today stating she is much better after the Augmentin. Additionally after starting the Spiriva her cough has resolved. She denies fever, chest pain, orthopnea, or hemoptysis. Per the patient her secretions are clear to white. Per Dr. Golden Pop suggestion the patient has been seen by a dentist, who felt there was no acute oral /dental  issues that could be causing a pneumonia. They have made recommendation that she have periodontal follow-up. Patient states she does not feel that she is aspirating, as she has no coughing while eating or drinking. We discussed the possibility of silent aspiration and that the only true evaluation would be a modified barium swallow. She prefers to wait until after the CT scan scheduled for 11/24/2016 to determine if she needs swallow evaluation.Patient weight today is 100 pounds. She states she is using meal supplements at bedtime.   OV 11/29/2016  Chief Complaint  Patient presents with   Follow-up    Pt still has occ. SOB but not necessarily just with exertion and occ. cough with clear mucus. Denies any CP.     Gabrielle [name] is the aunt of GI Dr Billie Lade.  she presents for several issues.   COPD: She's not taking her Spiriva inhaler  Daughter Gabrielle [name] with her. sHe tells me that during switch pharmacies the Spiriva that left out.   COPD is considered stable    smoking: She continues to smoke she enjoys it. Refuses to quit  - Lower lobe pneumonia: She had follow-up CT scan of the chest that  shows significant improvement since July 2018. She still has some nodularity. She will need a repeat CT chest. She again denies any aspiration. The CT scan does show esophageal dysmotility. She will follow-up with GI. Daughter is asking about safety of doing endoscopy and colonoscopy.   -   OV 06/02/2017  Chief Complaint  Patient presents with   Follow-up    Pt here after CT chest. Pt states overall she feels her breathing is stable. Pt c/o prod cough with yellow mucus x months. Pt denies CP/tightness and f/c/s.      LEO WEYANDT is the [place] of DR [employer].    Fu copd and pulmonary infitlrate/nodule after pna.  LAst visit July 2018. Overal lstable. Was nto taking spiriva because [facility] was not delivering consistently to her (wrong name, address etc.,)Now back on spiriva though [place] and going to start. STabe. COntinues to smoker. CAT scorfe below shws stabiluty., Daughter tracy from [facility] here    Ct Chest Wo Contrast  Result Date: 06/01/2017 CLINICAL DATA:  Left lower lobe pneumonia. EXAM: CT CHEST WITHOUT CONTRAST TECHNIQUE: Multidetector CT imaging of the chest was performed following the standard protocol without IV contrast. COMPARISON:  11/24/2016. FINDINGS: Cardiovascular: The heart size is normal. No pericardial effusion. Coronary artery calcification is evident. Atherosclerotic calcification is noted in the wall of the thoracic aorta. Mediastinum/Nodes: Stable 10 mm short axis precarinal lymph node. Calcified lymph nodes seen in the left hilum. The esophagus has normal imaging features. There is no axillary lymphadenopathy. Lungs/Pleura: Centrilobular and paraseptal emphysema again noted. Patchy airspace disease seen previously posterior right upper lobe has clearly improved in the interval with some residual tree-in-bud nodularity, bronchiectasis, and small airway impaction identified posteriorly along the major fissure. Areas of mild bronchiectasis, airway impaction,  and scarring in the right middle lobe are stable. There is bronchial wall thickening with mild bronchiectasis in the lower lungs bilaterally. 4 mm right lower lobe nodule (image 104/series 3) is stable since prior study and also comparing to 06/27/2014 consistent with benign etiology. Stable calcified granuloma in the lingula. Upper Abdomen: Stable adrenal thickening bilaterally, compatible with hyperplasia. Granulomas disease again noted in the spleen. Musculoskeletal: Bone windows reveal no worrisome lytic or sclerotic osseous lesions. IMPRESSION: 1. Continued interval improvement in multifocal pulmonary opacities. Residua identified in the posterior right upper lobe, right middle lobe, and to a modest degree in each lower lobe may reflect incomplete resolution, evolving scar, or recurrent disease. Features may be related to atypical infection although aspiration can have this appearance. 2.  Emphysema. (ICD10-J43.9) 3.  Aortic Atherosclerois (ICD10-170.0) Electronically Signed   By: Misty Stanley M.D.   On: 06/01/2017 15:17    OV 03/17/2021  Subjective:  Patient ID: [name], female , DOB: 05-28-1930 , age 86 y.o. , MRN: [id number] , ADDRESS: [place] [id number] PCP Ma Hillock, DO Patient Care Team: Ma Hillock, DO as PCP - General (Family Medicine) Excell Seltzer, MD (Inactive) as Consulting Physician (General Surgery) Magrinat, Virgie Dad, MD as Consulting Physician (Oncology) Arloa Koh, MD (Inactive) as Consulting Physician (Radiation Oncology) Jarome Matin, MD as Consulting Physician (Dermatology) Brand Males, MD as Consulting Physician (Pulmonary Disease) Alda Berthold, DO as Consulting Physician (Neurology) Armbruster, Carlota Raspberry, MD as Consulting Physician (Gastroenterology) Bjorn Loser, MD as Consulting Physician (Urology)  This Provider for this visit: Treatment Team:  Attending Provider: Brand Males, MD    03/17/2021 -new visit because  she is reestablishing after 3 years.  [place] of Dr. Ernest Mallick and Dr. Billie Lade. Chief Complaint  Patient presents with   Consult    Pt is here based off of a referral from PCP to get re-established with Dr. Chase Caller.  Pt states that she does become SOB with exertion and is also tired a lot.     HPI BRETA DEMEDEIROS 88 y.o. -presents with her son Wilmarie Sparlin.  I have not seen her since 2019.  Marland Kitchen  Daughter Gabrielle [name] who lives in [place] takes care of medication decisions.  Patient tells me that she has been doing well for the last 3 years since she last saw me.  However in talking  to the son the story somewhat different.  She continues to smoke.  [place]  However over the time the son started noticing decline in functional status and increased self-neglect.  Also failure to thrive.  She was eating less.  She was losing weight.  There was hoarding in the house.  Decreased self-care.  She was also beginning to fall.  1 day she had fallen and was not found for 17 hours.  At this point in time they moved her to [facility].  She has a living brother who is living in [facility] at that time.  He is in his 9s.  They relocated him because he was also alone.  They now live together in a two-bedroom apartment at [facility].  The brother and sister like to travel the world.  At [facility] they monitor and supervise the medications and also give patient meals.  After that she has gained weight and she is feeling better.  She still able to walk without like a cane.  She does have access to a cane and a walker but she is not needing it.  When she walks and her to change she does get exhausted and short of breath but apparently according to her it only bothers her somewhat.  She also has a chronic cough but again it only bothers her somewhat.  In the interim there is also diagnosed of mild vascular dementia.     Is  been reluctant to undergo testing and treatment.  Discussed options of pulmonary function testing and nebulizers and overnight oxygen study and physical therapy.  She remained largely silent about these questions.  In the past we have discussed about quitting smoking but she is not interested.  This time I did not address the issue directly.  She did express a keen desire to travel to [facility].  The elevation here is 8000 feet.  She says that she went to [place] in [place] just a few years ago and it was 14,000 feet.  However according to the son she went to [place] last more than a decade ago.  I did indicate to her that travel at her age can be challenging.  She wants to make this trip with her 75 year old brother.  Did tell her that in this case we would need to go through these interventions including definitively.  At this point in time she reflected on it and felt she would postpone her trip for 1 or 2 years.  I did remind her that regardless of the trip that I would recommend these interventions.  The trip would be the extra motivation factor.  She seemed to agree to the recommendations.    OV 11/17/2021  Subjective:  Patient ID: [name], female , DOB: [birth year] , age 16 y.o. , MRN: [id number] , ADDRESS: [place] [address] PCP No primary care provider on file. Patient Care Team: Excell Seltzer, MD (Inactive) as Consulting Physician (General Surgery) Magrinat, Virgie Dad, MD (Inactive) as Consulting Physician (Oncology) Arloa Koh, MD (Inactive) as Consulting Physician (Radiation Oncology) Jarome Matin, MD as Consulting Physician (Dermatology) Brand Males, MD as Consulting Physician (Pulmonary Disease) Alda Berthold, DO as Consulting Physician (Neurology) Armbruster, Carlota Raspberry, MD as Consulting Physician (Gastroenterology) Bjorn Loser, MD as Consulting Physician (Urology)  This Provider for this visit: Treatment Team:  Attending Provider:  Chase Caller,  Belva Crome, MD    11/17/2021 -   Chief Complaint  Patient presents with   Follow-up    Cough    #COPD #Smoking history #Frailty #Dementia with decreased self-care  HPI CANESHA TESFAYE 88 y.o. presents with her daughter [name] who lives in [place].  Gabrielle [name] is 1 hour 40 minutes from [facility] where the patient lives.  Patient herself is living now with her brother.  After moving to [place] the weight has picked up.  Son Latiesha Harada lives 20 minutes away and checks on patient every few weeks.  In between there are other visitors including Dr. Carlis Abbott from pulmonary and also Dr. Hilarie Fredrickson who her family members visiting with her.  Patient continues on [place].  Daughter states that initially there is a lot of technical problems getting the nebulizer working correctly..  It is working well now but the patient's not sure if it is helping her.  The daughter is also concerned that the patient might not be compliant but on an average things that with the healthcare workers at [place] is being administered in a scheduled manner and patient is compliant.  She is asking about things to improve the quality of life.  We discussed long-acting beta agonist but this is a difficult schedule for the patient with nebulizer.  We discussed inhalers but her overall frailty makes inhalers challenging.  In the past she refused overnight oxygen test.  We discussed about the potential benefit of improved daytime quality of life and fraction of patients.  Daughter did talk to the patient in detail and the patient is in alignment to get this tested [previously she refused]  Other than that there are some nonspecific dizziness which the daughter attributes to reduced oral fluid intake.  Also in June 2023 she had asymptomatic COVID and was treated with antiviral.  In terms of dementia last visit patient was really expressing desire to travel to [facility] but this visit could not  room with a conversation at all.  At home she is able to walk without a cane   CAT COPD Symptom & Quality of Life Score (GSK trademark) 0 is no burden. 5 is highest burden 12/07/2013  06/02/2017   Never Cough -> Cough all the time 3 3  No phlegm in chest -> Chest is full of phlegm 4 2  No chest tightness -> Chest feels very tight 0 1  No dyspnea for 1 flight stairs/hill -> Very dyspneic for 1 flight of stairs 2 Did nto answer  No limitations for ADL at home -> Very limited with ADL at home 0 0  Confident leaving home -> Not at all confident leaving home 0 0  Sleep soundly -> Do not sleep soundly because of lung condition 0 0  Lots of Energy -> No energy at all 5 5  TOTAL Score (max 40)  14       PFT      No data to display         HRCT 04/07/21  Narrative & Impression  CLINICAL DATA:  Pulmonary opacities. Evaluate for interstitial lung disease. COPD.   EXAM: CT CHEST WITHOUT CONTRAST   TECHNIQUE: Multidetector CT imaging of the chest was performed following the standard protocol without intravenous contrast. High resolution imaging of the lungs, as well as inspiratory and expiratory imaging, was performed.   RADIATION DOSE REDUCTION: This exam was performed according to the departmental dose-optimization program which includes automated exposure control, adjustment  of the mA and/or kV according to patient size and/or use of iterative reconstruction technique.   COMPARISON:  06/01/2017.   FINDINGS: Cardiovascular: Atherosclerotic calcification of the aorta, aortic valve and coronary arteries. Heart size normal. No pericardial effusion.   Mediastinum/Nodes: No pathologically enlarged mediastinal or axillary lymph nodes. Hilar regions are difficult to evaluate without IV contrast. Esophagus is grossly unremarkable.   Lungs/Pleura: Negative for subpleural reticulation, traction bronchiectasis/bronchiolectasis, ground-glass, architectural distortion or honeycombing.  Minimal biapical pleuroparenchymal scarring. Centrilobular emphysema. Subpleural scarring in the posterior segment right upper lobe. 3 mm lateral right lower lobe nodule (3/100), unchanged and benign. Minimal scarring in the lingula. Debris in left lower lobe bronchi. No pleural fluid. Airway is otherwise unremarkable. No air trapping.   Upper Abdomen: Subcentimeter low-attenuation lesion in the left hepatic lobe is too small to characterize. Thickening of the adrenal glands. Visualized portions of the kidneys, spleen, pancreas, stomach and bowel are grossly unremarkable. No upper abdominal adenopathy.   Musculoskeletal: Degenerative changes in the spine. No worrisome lytic or sclerotic lesions.   IMPRESSION: 1. No evidence of interstitial lung disease or air trapping. 2. Left lower lobe bronchial debris. 3. Aortic atherosclerosis (ICD10-I70.0). Coronary artery calcification. 4.  Emphysema (ICD10-J43.9).     Electronically Signed   By: Lorin Picket M.D.   On: 04/08/2021 12:42        has a past medical history of Anxiety, Breast cancer ([place]) (2015), Breast cancer of upper-outer quadrant of right female breast ([place]) (10/2013), Cataract, COPD (chronic obstructive pulmonary disease) ([place]), Lung nodule, solitary (11/21/2013), Macular degeneration, Osteoporosis (10/2014), Pneumonia, Retina hole, Skin cancer, Stroke ([place]), and Tobacco dependence.   reports that she has been smoking cigarettes. She has a 60.00 pack-year smoking history. She has never used smokeless tobacco.  Past Surgical History:  Procedure Laterality Date   ABDOMINAL HYSTERECTOMY  1980   (ovaries intact)   [place]   Patient reports approximately 30 years ago having "bladder surgery "in which she describes a procedure that either stretched her bladder or her urethra dilated secondary to her not being able to void.   BREAST BIOPSY Right    CATARACT EXTRACTION Bilateral     MASTECTOMY Right 2015   MOHS SURGERY     Dr. Sarajane Jews   PARS PLANA VITRECTOMY W/ REPAIR OF MACULAR HOLE Right    RETINAL LASER PROCEDURE     TOTAL MASTECTOMY Right 12/18/2013   Procedure: RIGHT TOTAL MASTECTOMY;  Surgeon: Excell Seltzer, MD;  Location: [facility];  Service: General;  Laterality: Right;    No Known Allergies  Immunization History  Administered Date(s) Administered   Fluad Quad(high Dose 65+) 12/01/2018, 02/20/2020, 01/05/2021   Influenza, High Dose Seasonal PF 11/29/2016, 12/27/2017   Influenza,inj,Quad PF,6+ Mos 11/21/2013, 12/19/2014   PFIZER(Purple Top)SARS-COV-2 Vaccination 04/07/2019, 04/28/2019, 12/06/2019   PPD Test 09/20/2016   Pneumococcal Conjugate-13 11/21/2013   Pneumococcal Polysaccharide-23 11/29/2016   Td 12/21/2016   Zoster Recombinat (Shingrix) 03/18/2017, 06/22/2017    Family History  Problem Relation Age of Onset   Heart attack Mother        Deceased   Cancer Mother        fallopian tube cancer in 46s; deceased 34   Throat cancer Father        Deceased 20; smoker   Diabetes Brother    Prostate cancer Brother 44       Currently 33   Other Brother        Deceased,  hemorhhage of pancreas   Leukemia Maternal Aunt    Breast cancer Paternal Aunt        age at diagnosis unknown   Throat cancer Paternal Uncle        Deceased 46s; smoker   Leukemia Paternal Grandmother      Current Outpatient Medications:    Cholecalciferol 2000 units CAPS, Take 1 capsule (2,000 Units total) by mouth daily., Disp: 90 each, Rfl: 1   cyanocobalamin 1000 MCG tablet, Take 1,000 mcg by mouth daily., Disp: , Rfl:    donepezil (ARICEPT) 10 MG tablet, Take 1 tablet (10 mg total) by mouth daily., Disp: 90 tablet, Rfl: 3   Multiple Vitamins-Minerals (PRESERVISION AREDS 2 PO), Take 1 tablet by mouth 2 (two) times daily., Disp: , Rfl:    revefenacin (YUPELRI) 175 MCG/3ML nebulizer solution, Take 3 mLs (175 mcg total) by nebulization daily., Disp: 21 mL, Rfl: 0    albuterol (PROVENTIL) (2.5 MG/3ML) 0.083% nebulizer solution, Take 3 mLs (2.5 mg total) by nebulization every 6 (six) hours as needed for wheezing or shortness of breath. (Patient not taking: Reported on 11/17/2021), Disp: 75 mL, Rfl: 12      Objective:   Vitals:   11/17/21 1120  BP: 116/68  Pulse: (!) 107  Temp: 98.7 F (37.1 C)  TempSrc: Oral  SpO2: 92%  Weight: 123 lb 3.2 oz (55.9 kg)  Height: '5\' 4"'  (1.626 m)    Estimated body mass index is 21.15 kg/m as calculated from the following:   Height as of this encounter: '5\' 4"'  (1.626 m).   Weight as of this encounter: 123 lb 3.2 oz (55.9 kg).  '@WEIGHTCHANGE' @  Filed Weights   11/17/21 1120  Weight: 123 lb 3.2 oz (55.9 kg)     Physical Exam  General: No distress.  Frail female looks stable.  She was able to walk a few steps. Neuro: Alert and Oriented x 3. GCS 15. Speech normal Psych: Pleasant Resp:  Barrel Chest - yes.  Wheeze - n, Crackles - no, No overt respiratory distress overall diminished air entry CVS: Normal heart sounds. Murmurs - no Ext: Stigmata of Connective Tissue Disease - no HEENT: Normal upper airway. PEERL +. No post nasal drip        Assessment:       ICD-10-CM   1. COPD, moderate (HCC)  J44.9 Alpha-1 antitrypsin phenotype    Pulse oximetry, overnight    Pulmonary function test    Alpha-1 antitrypsin phenotype    2. History of smoking greater than 50 pack years  Z87.891          Plan:     Patient Instructions     ICD-10-CM   1. COPD, moderate ([place])  J44.9     2. History of smoking greater than 50 pack years  Z87.891      Stable disease but looking for more symptom relief  Plan Continue  yupelri nebulizer once daily  Start albuterol neb once daily and also continue prn; if this works can look at long acting [place] on room air Test blood for alpha 1 phenotype  11/17/2021 ; genetic test for copd Respiratory vaccines - flu, covid, RSV recommended  Followup  - 8 weeks to see  Dr Chase Caller for followup; face to face    SIGNATURE    Dr. Brand Males, M.D., F.C.C.P,  Pulmonary and [place] Staff Physician, [place] Director - Interstitial Lung Disease  Program  [facility] -  [place] at [place], [place], [address]  Pager: [phone], If no answer or between  15:00h - 7:00h: call [phone] Telephone: [phone]  12:33 PM 11/17/2021

## 2021-11-27 LAB — ALPHA-1 ANTITRYPSIN PHENOTYPE: A-1 Antitrypsin, Ser: 161 mg/dL (ref 83–199)

## 2021-12-21 ENCOUNTER — Other Ambulatory Visit: Payer: Self-pay | Admitting: Neurology

## 2021-12-21 ENCOUNTER — Other Ambulatory Visit: Payer: Self-pay

## 2021-12-21 DIAGNOSIS — F01518 Vascular dementia, unspecified severity, with other behavioral disturbance: Secondary | ICD-10-CM

## 2021-12-21 DIAGNOSIS — F423 Hoarding disorder: Secondary | ICD-10-CM

## 2021-12-21 MED ORDER — DONEPEZIL HCL 10 MG PO TABS: 10.0000 mg | ORAL_TABLET | Freq: Every day | ORAL | 0 refills | Status: AC

## 2022-01-06 ENCOUNTER — Telehealth: Payer: Self-pay | Admitting: Internal Medicine

## 2022-01-07 NOTE — Telephone Encounter (Signed)
Called and spoke to [place] at [facility] and she is faxing over the most recent ONO for this patient to have Dr Chase Caller look at it.   Called and left voicemail for [name] that I was having ONO sent to [place] and when MR looks at the results we will call her with an update. Nothing further needed

## 2022-01-18 ENCOUNTER — Encounter: Payer: Self-pay | Admitting: Internal Medicine

## 2022-01-18 NOTE — Telephone Encounter (Signed)
ONO results still have not been received. Have sent a community message to  [employer] checking on the results.

## 2022-01-18 NOTE — Telephone Encounter (Signed)
Gabrielle [name], MR, please advise on ONO results. Per 11/8 phone note the results were being faxed over. Thanks.

## 2022-01-21 ENCOUNTER — Emergency Department (HOSPITAL_COMMUNITY): Payer: Medicare HMO

## 2022-01-21 ENCOUNTER — Inpatient Hospital Stay (HOSPITAL_COMMUNITY)
Admission: EM | Admit: 2022-01-21 | Discharge: 2022-01-28 | DRG: 064 | Disposition: A | Discharge status: Skilled Nursing Facility | Payer: Medicare HMO | Source: Skilled Nursing Facility | Attending: Internal Medicine | Admitting: Internal Medicine

## 2022-01-21 ENCOUNTER — Other Ambulatory Visit: Payer: Self-pay

## 2022-01-21 ENCOUNTER — Encounter (HOSPITAL_COMMUNITY): Payer: Self-pay | Admitting: Emergency Medicine

## 2022-01-21 DIAGNOSIS — H3530 Unspecified macular degeneration: Secondary | ICD-10-CM | POA: Diagnosis present

## 2022-01-21 DIAGNOSIS — Z1152 Encounter for screening for COVID-19: Secondary | ICD-10-CM

## 2022-01-21 DIAGNOSIS — L8990 Pressure ulcer of unspecified site, unspecified stage: Secondary | ICD-10-CM | POA: Insufficient documentation

## 2022-01-21 DIAGNOSIS — Z8049 Family history of malignant neoplasm of other genital organs: Secondary | ICD-10-CM

## 2022-01-21 DIAGNOSIS — I6381 Other cerebral infarction due to occlusion or stenosis of small artery: Principal | ICD-10-CM | POA: Diagnosis present

## 2022-01-21 DIAGNOSIS — I509 Heart failure, unspecified: Secondary | ICD-10-CM | POA: Diagnosis present

## 2022-01-21 DIAGNOSIS — Z85828 Personal history of other malignant neoplasm of skin: Secondary | ICD-10-CM

## 2022-01-21 DIAGNOSIS — F17210 Nicotine dependence, cigarettes, uncomplicated: Secondary | ICD-10-CM | POA: Diagnosis present

## 2022-01-21 DIAGNOSIS — E440 Moderate protein-calorie malnutrition: Secondary | ICD-10-CM | POA: Diagnosis present

## 2022-01-21 DIAGNOSIS — I639 Cerebral infarction, unspecified: Secondary | ICD-10-CM

## 2022-01-21 DIAGNOSIS — J449 Chronic obstructive pulmonary disease, unspecified: Secondary | ICD-10-CM | POA: Diagnosis present

## 2022-01-21 DIAGNOSIS — F0150 Vascular dementia without behavioral disturbance: Secondary | ICD-10-CM | POA: Diagnosis present

## 2022-01-21 DIAGNOSIS — J189 Pneumonia, unspecified organism: Secondary | ICD-10-CM | POA: Diagnosis not present

## 2022-01-21 DIAGNOSIS — Z853 Personal history of malignant neoplasm of breast: Secondary | ICD-10-CM | POA: Insufficient documentation

## 2022-01-21 DIAGNOSIS — F419 Anxiety disorder, unspecified: Secondary | ICD-10-CM | POA: Diagnosis present

## 2022-01-21 DIAGNOSIS — I342 Nonrheumatic mitral (valve) stenosis: Secondary | ICD-10-CM | POA: Diagnosis present

## 2022-01-21 DIAGNOSIS — Z8249 Family history of ischemic heart disease and other diseases of the circulatory system: Secondary | ICD-10-CM

## 2022-01-21 DIAGNOSIS — Z9079 Acquired absence of other genital organ(s): Secondary | ICD-10-CM

## 2022-01-21 DIAGNOSIS — Z803 Family history of malignant neoplasm of breast: Secondary | ICD-10-CM

## 2022-01-21 DIAGNOSIS — R627 Adult failure to thrive: Secondary | ICD-10-CM | POA: Diagnosis present

## 2022-01-21 DIAGNOSIS — Z90710 Acquired absence of both cervix and uterus: Secondary | ICD-10-CM

## 2022-01-21 DIAGNOSIS — R911 Solitary pulmonary nodule: Secondary | ICD-10-CM | POA: Diagnosis present

## 2022-01-21 DIAGNOSIS — Z8673 Personal history of transient ischemic attack (TIA), and cerebral infarction without residual deficits: Secondary | ICD-10-CM

## 2022-01-21 DIAGNOSIS — Z9011 Acquired absence of right breast and nipple: Secondary | ICD-10-CM

## 2022-01-21 DIAGNOSIS — G8194 Hemiplegia, unspecified affecting left nondominant side: Secondary | ICD-10-CM | POA: Diagnosis present

## 2022-01-21 DIAGNOSIS — J440 Chronic obstructive pulmonary disease with acute lower respiratory infection: Secondary | ICD-10-CM | POA: Diagnosis present

## 2022-01-21 DIAGNOSIS — M810 Age-related osteoporosis without current pathological fracture: Secondary | ICD-10-CM | POA: Diagnosis present

## 2022-01-21 DIAGNOSIS — R29810 Facial weakness: Secondary | ICD-10-CM | POA: Diagnosis present

## 2022-01-21 DIAGNOSIS — R531 Weakness: Secondary | ICD-10-CM | POA: Diagnosis not present

## 2022-01-21 DIAGNOSIS — Z6823 Body mass index (BMI) 23.0-23.9, adult: Secondary | ICD-10-CM

## 2022-01-21 DIAGNOSIS — Z79899 Other long term (current) drug therapy: Secondary | ICD-10-CM

## 2022-01-21 LAB — URINALYSIS, ROUTINE W REFLEX MICROSCOPIC
Bilirubin Urine: NEGATIVE
Glucose, UA: NEGATIVE mg/dL
Hgb urine dipstick: NEGATIVE
Ketones, ur: NEGATIVE mg/dL
Leukocytes,Ua: NEGATIVE
Nitrite: NEGATIVE
Protein, ur: NEGATIVE mg/dL
Specific Gravity, Urine: 1.017 (ref 1.005–1.030)
pH: 5 (ref 5.0–8.0)

## 2022-01-21 LAB — BASIC METABOLIC PANEL
Anion gap: 7 (ref 5–15)
BUN: 38 mg/dL — ABNORMAL HIGH (ref 8–23)
CO2: 28 mmol/L (ref 22–32)
Calcium: 9 mg/dL (ref 8.9–10.3)
Chloride: 109 mmol/L (ref 98–111)
Creatinine, Ser: 0.98 mg/dL (ref 0.44–1.00)
GFR, Estimated: 55 mL/min — ABNORMAL LOW (ref >60)
Glucose, Bld: 97 mg/dL (ref 70–99)
Potassium: 3.8 mmol/L (ref 3.5–5.1)
Sodium: 144 mmol/L (ref 135–145)

## 2022-01-21 LAB — CBC WITH DIFFERENTIAL/PLATELET
Abs Immature Granulocytes: 0.04 10*3/uL (ref 0.00–0.07)
Basophils Absolute: 0.1 10*3/uL (ref 0.0–0.1)
Basophils Relative: 1 %
Eosinophils Absolute: 0.1 10*3/uL (ref 0.0–0.5)
Eosinophils Relative: 1 %
HCT: 36.4 % (ref 36.0–46.0)
Hemoglobin: 11.9 g/dL — ABNORMAL LOW (ref 12.0–15.0)
Immature Granulocytes: 0 %
Lymphocytes Relative: 19 %
Lymphs Abs: 1.8 10*3/uL (ref 0.7–4.0)
MCH: 32.4 pg (ref 26.0–34.0)
MCHC: 32.7 g/dL (ref 30.0–36.0)
MCV: 99.2 fL (ref 80.0–100.0)
Monocytes Absolute: 0.8 10*3/uL (ref 0.1–1.0)
Monocytes Relative: 9 %
Neutro Abs: 6.7 10*3/uL (ref 1.7–7.7)
Neutrophils Relative %: 70 %
Platelets: 353 10*3/uL (ref 150–400)
RBC: 3.67 MIL/uL — ABNORMAL LOW (ref 3.87–5.11)
RDW: 13.5 % (ref 11.5–15.5)
WBC: 9.6 10*3/uL (ref 4.0–10.5)
nRBC: 0 % (ref 0.0–0.2)

## 2022-01-21 LAB — LACTIC ACID, PLASMA
Lactic Acid, Venous: 1.2 mmol/L (ref 0.5–1.9)
Lactic Acid, Venous: 1.1 mmol/L (ref 0.5–1.9)

## 2022-01-21 LAB — RESP PANEL BY RT-PCR (FLU A&B, COVID) ARPGX2
Influenza A by PCR: NEGATIVE
Influenza B by PCR: NEGATIVE
SARS Coronavirus 2 by RT PCR: NEGATIVE

## 2022-01-21 MED ORDER — ONDANSETRON HCL 4 MG PO TABS: 4.0000 mg | ORAL_TABLET | Freq: Four times a day (QID) | ORAL | Status: DC | PRN

## 2022-01-21 MED ORDER — GUAIFENESIN ER 600 MG PO TB12
600.0000 mg | ORAL_TABLET | Freq: Two times a day (BID) | ORAL | Status: DC
  Administered 2022-01-21 – 2022-01-28 (×14): 600 mg via ORAL
  Filled 2022-01-21 (×14): qty 1

## 2022-01-21 MED ORDER — SODIUM CHLORIDE 0.9 % IV SOLN
500.0000 mg | Freq: Once | INTRAVENOUS | Status: AC
  Administered 2022-01-21: 500 mg via INTRAVENOUS
  Filled 2022-01-21: qty 5

## 2022-01-21 MED ORDER — IPRATROPIUM-ALBUTEROL 0.5-2.5 (3) MG/3ML IN SOLN: 3.0000 mL | Freq: Four times a day (QID) | RESPIRATORY_TRACT | Status: DC | PRN

## 2022-01-21 MED ORDER — ACETAMINOPHEN 325 MG PO TABS: 650.0000 mg | ORAL_TABLET | Freq: Four times a day (QID) | ORAL | Status: DC | PRN

## 2022-01-21 MED ORDER — SODIUM CHLORIDE 0.9 % IV SOLN
1.0000 g | Freq: Once | INTRAVENOUS | Status: AC
  Administered 2022-01-21: 1 g via INTRAVENOUS
  Filled 2022-01-21: qty 10

## 2022-01-21 MED ORDER — SODIUM CHLORIDE 0.9 % IV SOLN
500.0000 mg | INTRAVENOUS | Status: AC
  Administered 2022-01-22 – 2022-01-25 (×4): 500 mg via INTRAVENOUS
  Filled 2022-01-21 (×4): qty 5

## 2022-01-21 MED ORDER — SODIUM CHLORIDE 0.9 % IV SOLN: INTRAVENOUS | Status: DC

## 2022-01-21 MED ORDER — ONDANSETRON HCL 4 MG/2ML IJ SOLN: 4.0000 mg | Freq: Four times a day (QID) | INTRAMUSCULAR | Status: DC | PRN

## 2022-01-21 MED ORDER — INFLUENZA VAC A&B SA ADJ QUAD 0.5 ML IM PRSY: 0.5000 mL | PREFILLED_SYRINGE | INTRAMUSCULAR | Status: DC | PRN

## 2022-01-21 MED ORDER — SODIUM CHLORIDE 0.9 % IV SOLN
2.0000 g | INTRAVENOUS | Status: AC
  Administered 2022-01-22 – 2022-01-25 (×4): 2 g via INTRAVENOUS
  Filled 2022-01-21 (×4): qty 20

## 2022-01-21 MED ORDER — ACETAMINOPHEN 650 MG RE SUPP: 650.0000 mg | Freq: Four times a day (QID) | RECTAL | Status: DC | PRN

## 2022-01-21 NOTE — ED Triage Notes (Signed)
Pt via EMS from [facility] c/o weakness due to UTI, sx x 3-4 days with current rx doxycycline and prednisone. Family reports that they were unable to get her out of bed today due to pt's weakness, and she reports bilateral flank pain R.L with cloudy urine. VSS en route. Hx COPD, emphysema. A/O x 4  O2 93% room air BP 138/60 HR 65  Ambulatory at baseline.

## 2022-01-21 NOTE — H&P (Signed)
History and Physical    Patient: Gabrielle [name] FFM:[id number] DOB: [birth year] DOA: 01/21/2022 DOS: the patient was seen and examined on 01/21/2022 PCP: Pcp, No  Patient coming from: ALF/ILF  Chief Complaint:  Chief Complaint  Patient presents with   Weakness   HPI: Gabrielle [name] is a 88 y.o. female with medical history significant of COPD, tobacco abuse, vascular dementia, breast CA. Presenting with generalized weakness. She reports that she's had increased cough and weakness over the last 3 to 4 days,. She is not aware of any fevers. She hasn't had any N/V, but she has had poor appetite. She was started on antibiotics and steroids for a presumed respiratory infection. She says she finished that regimen yesterday. She has not noticed an improvement. This morning, when she tried to get out of bed, she found she was too weak and her cough persists. So she decided to come to the ED for evaluation. She denies any other aggravating or alleviating factors.   Review of Systems: As mentioned in the history of present illness. All other systems reviewed and are negative. Past Medical History:  Diagnosis Date   Anxiety    Breast cancer ([name]) 2015   right   Breast cancer of upper-outer quadrant of right female breast ([place]) 10/2013   Anastrazole since 12/2013--plan is to take this for 5 yrs.  No evidence of dz recurrence as of 11/2015 oncol f/u.   Cataract    COPD (chronic obstructive pulmonary disease) (HCC)    Lung nodule, solitary 11/21/2013   6 mm lung nodule R lung seen on chest CT.  R breast mass. Disgnostic MMG pending.    Macular degeneration    Osteoporosis 10/2014   T-score -2.8   Pneumonia    hx of    Retina hole    Skin cancer    Stroke ([place])    Tobacco dependence    Past Surgical History:  Procedure Laterality Date   ABDOMINAL HYSTERECTOMY  1980   (ovaries intact)   [place]   Patient reports approximately 30 years ago having "bladder surgery  "in which she describes a procedure that either stretched her bladder or her urethra dilated secondary to her not being able to void.   BREAST BIOPSY Right    CATARACT EXTRACTION Bilateral    MASTECTOMY Right 2015   MOHS SURGERY     Dr. Sarajane Jews   PARS PLANA VITRECTOMY W/ REPAIR OF MACULAR HOLE Right    RETINAL LASER PROCEDURE     TOTAL MASTECTOMY Right 12/18/2013   Procedure: RIGHT TOTAL MASTECTOMY;  Surgeon: Excell Seltzer, MD;  Location: [facility];  Service: General;  Laterality: Right;   Social History:  reports that she has been smoking cigarettes. She has a 60.00 pack-year smoking history. She has never used smokeless tobacco. She reports current alcohol use of about 2.0 standard drinks of alcohol per week. She reports that she does not use drugs.  No Known Allergies  Family History  Problem Relation Age of Onset   Heart attack Mother        Deceased   Cancer Mother        fallopian tube cancer in 97s; deceased 54   Throat cancer Father        Deceased 65; smoker   Diabetes Brother    Prostate cancer Brother 83       Currently 81   Other Brother        Deceased,  hemorhhage of pancreas   Leukemia Maternal Aunt    Breast cancer Paternal Aunt        age at diagnosis unknown   Throat cancer Paternal Uncle        Deceased 54s; smoker   Leukemia Paternal Grandmother     Prior to Admission medications   Medication Sig Start Date End Date Taking? Authorizing Provider  albuterol (PROVENTIL) (2.5 MG/3ML) 0.083% nebulizer solution Take 3 mLs (2.5 mg total) by nebulization every 6 (six) hours as needed for wheezing or shortness of breath. Patient not taking: Reported on 11/17/2021 03/17/21   Brand Males, MD  Cholecalciferol 2000 units CAPS Take 1 capsule (2,000 Units total) by mouth daily. 12/02/17   Kuneff, Renee A, DO  cyanocobalamin 1000 MCG tablet Take 1,000 mcg by mouth daily.    [provider]  donepezil (ARICEPT) 10 MG tablet Take 1 tablet (10 mg total) by  mouth daily. 12/21/21   Narda Amber K, DO  Multiple Vitamins-Minerals (PRESERVISION AREDS 2 PO) Take 1 tablet by mouth 2 (two) times daily.    [provider]  revefenacin (YUPELRI) 175 MCG/3ML nebulizer solution Take 3 mLs (175 mcg total) by nebulization daily. 03/17/21   Brand Males, MD    Physical Exam: Vitals:   01/21/22 1347 01/21/22 1349 01/21/22 1454 01/21/22 1539  BP: 134/69   (!) 157/113  Pulse: 61   60  Resp: 16   (!) 22  Temp: 98 F (36.7 C)  99.2 F (37.3 C)   TempSrc: Oral  Rectal   SpO2: 93%   96%  Weight:  61.2 kg    Height:  '5\' 4"'$  (1.626 m)     General: 88 y.o. female resting in bed in NAD Eyes: PERRL, normal sclera ENMT: Nares patent w/o discharge, orophaynx clear, dentition normal, ears w/o discharge/lesions/ulcers Neck: Supple, trachea midline Cardiovascular: RRR, +S1, S2, no m/g/r, equal pulses throughout Respiratory: decreased at bases, no w/r/r, normal WOB GI: BS+, NDNT, no masses noted, no organomegaly noted MSK: No e/c/c Neuro: A&O x 3, no focal deficits Psyc: Appropriate interaction and affect, calm/cooperative  Data Reviewed:  Results for orders placed or performed during the hospital encounter of 01/21/22 (from the past 24 hour(s))  Basic metabolic panel     Status: Abnormal   Collection Time: 01/21/22  2:04 PM  Result Value Ref Range   Sodium 144 135 - 145 mmol/L   Potassium 3.8 3.5 - 5.1 mmol/L   Chloride 109 98 - 111 mmol/L   CO2 28 22 - 32 mmol/L   Glucose, Bld 97 70 - 99 mg/dL   BUN 38 (H) 8 - 23 mg/dL   Creatinine, Ser 0.98 0.44 - 1.00 mg/dL   Calcium 9.0 8.9 - 10.3 mg/dL   GFR, Estimated 55 (L) >60 mL/min   Anion gap 7 5 - 15  CBC with Differential     Status: Abnormal   Collection Time: 01/21/22  2:04 PM  Result Value Ref Range   WBC 9.6 4.0 - 10.5 K/uL   RBC 3.67 (L) 3.87 - 5.11 MIL/uL   Hemoglobin 11.9 (L) 12.0 - 15.0 g/dL   HCT 36.4 36.0 - 46.0 %   MCV 99.2 80.0 - 100.0 fL   MCH 32.4 26.0 - 34.0 pg   MCHC  32.7 30.0 - 36.0 g/dL   RDW 13.5 11.5 - 15.5 %   Platelets 353 150 - 400 K/uL   nRBC 0.0 0.0 - 0.2 %   Neutrophils Relative % 70 %  Neutro Abs 6.7 1.7 - 7.7 K/uL   Lymphocytes Relative 19 %   Lymphs Abs 1.8 0.7 - 4.0 K/uL   Monocytes Relative 9 %   Monocytes Absolute 0.8 0.1 - 1.0 K/uL   Eosinophils Relative 1 %   Eosinophils Absolute 0.1 0.0 - 0.5 K/uL   Basophils Relative 1 %   Basophils Absolute 0.1 0.0 - 0.1 K/uL   Immature Granulocytes 0 %   Abs Immature Granulocytes 0.04 0.00 - 0.07 K/uL  Lactic acid, plasma     Status: None   Collection Time: 01/21/22  2:04 PM  Result Value Ref Range   Lactic Acid, Venous 1.2 0.5 - 1.9 mmol/L  Resp Panel by RT-PCR (Flu A&B, Covid) Anterior Nasal Swab     Status: None   Collection Time: 01/21/22  2:25 PM   Specimen: Anterior Nasal Swab  Result Value Ref Range   SARS Coronavirus 2 by RT PCR NEGATIVE NEGATIVE   Influenza A by PCR NEGATIVE NEGATIVE   Influenza B by PCR NEGATIVE NEGATIVE    Assessment and Plan: LLL PNA     - place in obs, tele     - started on rocephin, azithro     - add guaifenesin     - fluids     - check urine legionella, strep     - COVID/flu negative  COPD     - continue home regimen  Generalized weakness     - EDP had focality on exam; I did not appreciate any on mine     - MRI brain has been ordered; will follow up     - she has had decreased PO intake and seems a little dry on exam; will get her some fluids  Vascular dementia     - continue home regimen when confirmed  Tobacco abuse     - counsel against further use  Advance Care Planning:   Code Status: FULL  Consults: None  Family Communication: w/ family at bedside  Severity of Illness: The appropriate patient status for this patient is OBSERVATION. Observation status is judged to be reasonable and necessary in order to provide the required intensity of service to ensure the patient's safety. The patient's presenting symptoms, physical exam  findings, and initial radiographic and laboratory data in the context of their medical condition is felt to place them at decreased risk for further clinical deterioration. Furthermore, it is anticipated that the patient will be medically stable for discharge from the hospital within 2 midnights of admission.   Author: Jonnie Finner, DO 01/21/2022 4:34 PM  For on call review [email or web address].

## 2022-01-21 NOTE — ED Provider Notes (Signed)
Care assumed from Dr. Tamera Punt.  Patient with generalized weakness since this morning.  She has recently been treated for a respiratory infection with Zithromax and prednisone for about 3 or 4 days.  Today she was too weak to get out of bed and there were some concern for UTI.  She has had cough and shortness of breath.  She is afebrile here and not hypoxic.  She is awaiting labs and imaging.  X-ray is concerning for left basilar infiltrate with small pleural effusion.  Will initiate broad-spectrum antibiotics for community-acquired pneumonia while cultures are pending.  CT scan shows areas of chronic appearing ischemia.  Per Dr. Mollie Germany exam patient did have some increased weakness to the left side we will plan for MRI to rule out infarct.  She is out of the window for any kind of intervention for possible CVA.  Given failure to thrive and worsening pain.  Discussed with Dr. Marylyn Ishihara.    Ezequiel Essex, MD 01/21/22 [phone]

## 2022-01-21 NOTE — ED Provider Notes (Addendum)
[facility] DEPT Provider Note   CSN: [id number] Arrival date & time: 01/21/22  1326     History  Chief Complaint  Patient presents with   Weakness    Gabrielle [name] is a 88 y.o. female.  Patient is a 88 year old female who presents with generalized weakness.  She lives at [facility] independent living facility.  Per EMS report, she has recently been treated for UTI with symptoms over the last 3 to 4 days.  She is currently on doxycycline and prednisone.  However that may be related to her recent cough that she reports that she has had for the last several days.  They report that she still having urinary symptoms.  Today they were able to get her out of bed due to her weakness.  There is reported cloudy urine.  She reports a cough for the last few days.  She has some shortness of breath at times.  No associated chest pain.  No known fevers.  She denies any current urinary symptoms.  No recent falls.  She is amatory at baseline.  I was able to clarify with family that patient has actually been on azithromycin and prednisone for recent respiratory infection/COPD exacerbation.  They did think that she was also on antibiotic for urinary tract infection but I do not see this on her medication list.       Home Medications Prior to Admission medications   Medication Sig Start Date End Date Taking? Authorizing Provider  albuterol (PROVENTIL) (2.5 MG/3ML) 0.083% nebulizer solution Take 3 mLs (2.5 mg total) by nebulization every 6 (six) hours as needed for wheezing or shortness of breath. Patient not taking: Reported on 11/17/2021 03/17/21   Brand Males, MD  Cholecalciferol 2000 units CAPS Take 1 capsule (2,000 Units total) by mouth daily. 12/02/17   Kuneff, Renee A, DO  cyanocobalamin 1000 MCG tablet Take 1,000 mcg by mouth daily.    [provider]  donepezil (ARICEPT) 10 MG tablet Take 1 tablet (10 mg total) by mouth daily. 12/21/21   Narda Amber K, DO  Multiple Vitamins-Minerals (PRESERVISION AREDS 2 PO) Take 1 tablet by mouth 2 (two) times daily.    [provider]  revefenacin (YUPELRI) 175 MCG/3ML nebulizer solution Take 3 mLs (175 mcg total) by nebulization daily. 03/17/21   Brand Males, MD      Allergies    Patient has no known allergies.    Review of Systems   Review of Systems  Constitutional:  Positive for fatigue. Negative for chills, diaphoresis and fever.  HENT:  Negative for congestion, rhinorrhea and sneezing.   Eyes: Negative.   Respiratory:  Positive for cough and shortness of breath. Negative for chest tightness.   Cardiovascular:  Negative for chest pain and leg swelling.  Gastrointestinal:  Negative for abdominal pain, blood in stool, diarrhea, nausea and vomiting.  Genitourinary:  Negative for difficulty urinating, flank pain, frequency and hematuria.  Musculoskeletal:  Negative for arthralgias and back pain.  Skin:  Negative for rash.  Neurological:  Positive for weakness (Generalized) and light-headedness. Negative for dizziness, speech difficulty, numbness and headaches.    Physical Exam Updated Vital Signs BP 134/69 (BP Location: Left Arm)   Pulse 61   Temp 98 F (36.7 C) (Oral)   Resp 16   Ht '5\' 4"'$  (1.626 m)   Wt 61.2 kg   SpO2 93%   BMI 23.17 kg/m  Physical Exam Constitutional:      Appearance: She is  well-developed.  HENT:     Head: Normocephalic and atraumatic.  Eyes:     Pupils: Pupils are equal, round, and reactive to light.  Cardiovascular:     Rate and Rhythm: Normal rate and regular rhythm.     Heart sounds: Normal heart sounds.  Pulmonary:     Effort: Pulmonary effort is normal. No respiratory distress.     Breath sounds: Normal breath sounds. No wheezing or rales.  Chest:     Chest wall: No tenderness.  Abdominal:     General: Bowel sounds are normal.     Palpations: Abdomen is soft.     Tenderness: There is no abdominal tenderness. There is no  guarding or rebound.  Musculoskeletal:        General: Normal range of motion.     Cervical back: Normal range of motion and neck supple.  Lymphadenopathy:     Cervical: No cervical adenopathy.  Skin:    General: Skin is warm and dry.     Findings: No rash.  Neurological:     Mental Status: She is alert and oriented to person, place, and time.     Comments: Patient seems to have some weakness in the left arm and leg as compared to the right.  She has 4 out of 5 strength in the left upper extremity, 5 out of 5 strength in the right upper extremity, she is unable to lift either one of her legs off the bed however she has increased strength on flexion and extension of her feet in the right as compared to the left.  No sensory deficit is appreciated.  No facial drooping.     ED Results / Procedures / Treatments   Labs (all labs ordered are listed, but only abnormal results are displayed) Labs Reviewed  BASIC METABOLIC PANEL - Abnormal; Notable for the following components:      Result Value   BUN 38 (*)    GFR, Estimated 55 (*)    All other components within normal limits  CBC WITH DIFFERENTIAL/PLATELET - Abnormal; Notable for the following components:   RBC 3.67 (*)    Hemoglobin 11.9 (*)    All other components within normal limits  URINE CULTURE  CULTURE, BLOOD (ROUTINE X 2)  CULTURE, BLOOD (ROUTINE X 2)  RESP PANEL BY RT-PCR (FLU A&B, COVID) [id number]  LACTIC ACID, PLASMA  LACTIC ACID, PLASMA  URINALYSIS, ROUTINE W REFLEX MICROSCOPIC    EKG EKG Interpretation  Date/Time:  Thursday January 21 2022 14:43:29 EST Ventricular Rate:  59 PR Interval:  149 QRS Duration: 80 QT Interval:  455 QTC Calculation: 451 R Axis:   34 Text Interpretation: Sinus rhythm Left atrial enlargement Minimal ST depression, anterolateral leads No old tracing to compare Confirmed by Malvin Johns [id number]) on 01/21/2022 2:45:19 PM  Radiology No results found.  Procedures Procedures     Medications Ordered in ED Medications - No data to display  ED Course/ Medical Decision Making/ A&P                           Medical Decision Making Amount and/or Complexity of Data Reviewed Labs: ordered. Radiology: ordered.   Patient is a 88 year old who presents with generalized weakness.  However on exam she seems to have some increased left-sided weakness as compared to the right but she is weak all over.  She is not noted to be febrile on arrival.  Her WBC count shows a normal  white count.  Her lactate is normal.  Her chest x-ray shows some atelectasis versus infiltrate but no definitive evidence of pneumonia.  Her respiratory panel and other labs including urinalysis are still pending.  Patient likely to need admission following results of lab work.  She also may need at some point an MRI given that her left side seems a little bit weaker than the right side.  Pt care turned over to Dr. Wyvonnia Dusky pending results.  Final Clinical Impression(s) / ED Diagnoses Final diagnoses:  Weakness    Rx / DC Orders ED Discharge Orders     None         Malvin Johns, MD 01/21/22 1447    Malvin Johns, MD 01/21/22 1502

## 2022-01-21 NOTE — ED Notes (Signed)
Attempted in and out cath, unsuccessful. MD notified.

## 2022-01-22 ENCOUNTER — Observation Stay (HOSPITAL_COMMUNITY): Payer: Medicare HMO

## 2022-01-22 DIAGNOSIS — I6389 Other cerebral infarction: Secondary | ICD-10-CM | POA: Diagnosis not present

## 2022-01-22 DIAGNOSIS — J449 Chronic obstructive pulmonary disease, unspecified: Secondary | ICD-10-CM | POA: Diagnosis not present

## 2022-01-22 DIAGNOSIS — R531 Weakness: Secondary | ICD-10-CM | POA: Diagnosis present

## 2022-01-22 DIAGNOSIS — R911 Solitary pulmonary nodule: Secondary | ICD-10-CM | POA: Diagnosis present

## 2022-01-22 DIAGNOSIS — I639 Cerebral infarction, unspecified: Secondary | ICD-10-CM

## 2022-01-22 DIAGNOSIS — H3530 Unspecified macular degeneration: Secondary | ICD-10-CM | POA: Diagnosis present

## 2022-01-22 DIAGNOSIS — M810 Age-related osteoporosis without current pathological fracture: Secondary | ICD-10-CM | POA: Diagnosis present

## 2022-01-22 DIAGNOSIS — R627 Adult failure to thrive: Secondary | ICD-10-CM | POA: Diagnosis present

## 2022-01-22 DIAGNOSIS — J189 Pneumonia, unspecified organism: Secondary | ICD-10-CM | POA: Diagnosis present

## 2022-01-22 DIAGNOSIS — E440 Moderate protein-calorie malnutrition: Secondary | ICD-10-CM | POA: Diagnosis present

## 2022-01-22 DIAGNOSIS — Z79899 Other long term (current) drug therapy: Secondary | ICD-10-CM | POA: Diagnosis not present

## 2022-01-22 DIAGNOSIS — Z85828 Personal history of other malignant neoplasm of skin: Secondary | ICD-10-CM | POA: Diagnosis not present

## 2022-01-22 DIAGNOSIS — I63321 Cerebral infarction due to thrombosis of right anterior cerebral artery: Secondary | ICD-10-CM

## 2022-01-22 DIAGNOSIS — Z1152 Encounter for screening for COVID-19: Secondary | ICD-10-CM | POA: Diagnosis not present

## 2022-01-22 DIAGNOSIS — I509 Heart failure, unspecified: Secondary | ICD-10-CM | POA: Diagnosis present

## 2022-01-22 DIAGNOSIS — R29810 Facial weakness: Secondary | ICD-10-CM | POA: Diagnosis present

## 2022-01-22 DIAGNOSIS — I6381 Other cerebral infarction due to occlusion or stenosis of small artery: Secondary | ICD-10-CM | POA: Diagnosis present

## 2022-01-22 DIAGNOSIS — G8194 Hemiplegia, unspecified affecting left nondominant side: Secondary | ICD-10-CM | POA: Diagnosis present

## 2022-01-22 DIAGNOSIS — F419 Anxiety disorder, unspecified: Secondary | ICD-10-CM | POA: Diagnosis present

## 2022-01-22 DIAGNOSIS — Z9079 Acquired absence of other genital organ(s): Secondary | ICD-10-CM | POA: Diagnosis not present

## 2022-01-22 DIAGNOSIS — I342 Nonrheumatic mitral (valve) stenosis: Secondary | ICD-10-CM | POA: Diagnosis present

## 2022-01-22 DIAGNOSIS — Z6823 Body mass index (BMI) 23.0-23.9, adult: Secondary | ICD-10-CM | POA: Diagnosis not present

## 2022-01-22 DIAGNOSIS — Z8249 Family history of ischemic heart disease and other diseases of the circulatory system: Secondary | ICD-10-CM | POA: Diagnosis not present

## 2022-01-22 DIAGNOSIS — F17210 Nicotine dependence, cigarettes, uncomplicated: Secondary | ICD-10-CM | POA: Diagnosis present

## 2022-01-22 DIAGNOSIS — J440 Chronic obstructive pulmonary disease with acute lower respiratory infection: Secondary | ICD-10-CM | POA: Diagnosis present

## 2022-01-22 DIAGNOSIS — Z90710 Acquired absence of both cervix and uterus: Secondary | ICD-10-CM | POA: Diagnosis not present

## 2022-01-22 DIAGNOSIS — L8990 Pressure ulcer of unspecified site, unspecified stage: Secondary | ICD-10-CM | POA: Insufficient documentation

## 2022-01-22 DIAGNOSIS — Z9011 Acquired absence of right breast and nipple: Secondary | ICD-10-CM | POA: Diagnosis not present

## 2022-01-22 DIAGNOSIS — Z8673 Personal history of transient ischemic attack (TIA), and cerebral infarction without residual deficits: Secondary | ICD-10-CM | POA: Diagnosis not present

## 2022-01-22 DIAGNOSIS — F0150 Vascular dementia without behavioral disturbance: Secondary | ICD-10-CM | POA: Diagnosis present

## 2022-01-22 LAB — LIPID PANEL
Cholesterol: 157 mg/dL (ref 0–200)
HDL: 24 mg/dL — ABNORMAL LOW (ref >40)
LDL Cholesterol: 116 mg/dL — ABNORMAL HIGH (ref 0–99)
Total CHOL/HDL Ratio: 6.5 RATIO
Triglycerides: 83 mg/dL (ref <150)
VLDL: 17 mg/dL (ref 0–40)

## 2022-01-22 LAB — RESPIRATORY PANEL BY PCR

## 2022-01-22 LAB — CBC
HCT: 33.9 % — ABNORMAL LOW (ref 36.0–46.0)
Hemoglobin: 11.1 g/dL — ABNORMAL LOW (ref 12.0–15.0)
MCH: 32 pg (ref 26.0–34.0)
MCHC: 32.7 g/dL (ref 30.0–36.0)
MCV: 97.7 fL (ref 80.0–100.0)
Platelets: 336 10*3/uL (ref 150–400)
RBC: 3.47 MIL/uL — ABNORMAL LOW (ref 3.87–5.11)
RDW: 13.5 % (ref 11.5–15.5)
WBC: 8.2 10*3/uL (ref 4.0–10.5)
nRBC: 0 % (ref 0.0–0.2)

## 2022-01-22 LAB — ECHOCARDIOGRAM COMPLETE
AR max vel: 2.11 cm2
AV Area VTI: 2.31 cm2
AV Area mean vel: 2.01 cm2
AV Mean grad: 3 mmHg
AV Peak grad: 5.7 mmHg
Ao pk vel: 1.19 m/s
Area-P 1/2: 2.09 cm2
Calc EF: 55.8 %
Height: 64 in
MV M vel: 4.37 m/s
MV Peak grad: 76.3 mmHg
MV VTI: 1.02 cm2
Radius: 0.6 cm
S' Lateral: 2.9 cm
Single Plane A2C EF: 55.8 %
Single Plane A4C EF: 57 %
Weight: 2160 oz

## 2022-01-22 LAB — URINE CULTURE: Culture: NO GROWTH

## 2022-01-22 LAB — COMPREHENSIVE METABOLIC PANEL
ALT: 10 U/L (ref 0–44)
AST: 13 U/L — ABNORMAL LOW (ref 15–41)
Albumin: 2.6 g/dL — ABNORMAL LOW (ref 3.5–5.0)
Alkaline Phosphatase: 70 U/L (ref 38–126)
Anion gap: 6 (ref 5–15)
BUN: 36 mg/dL — ABNORMAL HIGH (ref 8–23)
CO2: 27 mmol/L (ref 22–32)
Calcium: 8.3 mg/dL — ABNORMAL LOW (ref 8.9–10.3)
Chloride: 110 mmol/L (ref 98–111)
Creatinine, Ser: 0.7 mg/dL (ref 0.44–1.00)
GFR, Estimated: >60 mL/min (ref >60)
Glucose, Bld: 97 mg/dL (ref 70–99)
Potassium: 3.9 mmol/L (ref 3.5–5.1)
Sodium: 143 mmol/L (ref 135–145)
Total Bilirubin: 0.4 mg/dL (ref 0.3–1.2)
Total Protein: 5.6 g/dL — ABNORMAL LOW (ref 6.5–8.1)

## 2022-01-22 LAB — STREP PNEUMONIAE URINARY ANTIGEN: Strep Pneumo Urinary Antigen: NEGATIVE

## 2022-01-22 LAB — TSH: TSH: 2.155 u[IU]/mL (ref 0.350–4.500)

## 2022-01-22 MED ORDER — SODIUM CHLORIDE (PF) 0.9 % IJ SOLN
INTRAMUSCULAR | Status: AC
  Filled 2022-01-22: qty 50

## 2022-01-22 MED ORDER — PRESERVISION AREDS 2 PO CHEW: CHEWABLE_TABLET | Freq: Two times a day (BID) | ORAL | Status: DC

## 2022-01-22 MED ORDER — CLOPIDOGREL BISULFATE 75 MG PO TABS
75.0000 mg | ORAL_TABLET | Freq: Every day | ORAL | Status: DC
  Administered 2022-01-22 – 2022-01-28 (×7): 75 mg via ORAL
  Filled 2022-01-22 (×7): qty 1

## 2022-01-22 MED ORDER — VITAMIN B-12 1000 MCG PO TABS
1000.0000 ug | ORAL_TABLET | Freq: Every day | ORAL | Status: DC
  Administered 2022-01-22 – 2022-01-28 (×7): 1000 ug via ORAL
  Filled 2022-01-22 (×7): qty 1

## 2022-01-22 MED ORDER — ASPIRIN 81 MG PO TBEC
81.0000 mg | DELAYED_RELEASE_TABLET | Freq: Every day | ORAL | Status: DC
  Administered 2022-01-22 – 2022-01-28 (×7): 81 mg via ORAL
  Filled 2022-01-22 (×7): qty 1

## 2022-01-22 MED ORDER — PROSIGHT PO TABS
1.0000 | ORAL_TABLET | Freq: Two times a day (BID) | ORAL | Status: DC
  Administered 2022-01-22 – 2022-01-28 (×13): 1 via ORAL
  Filled 2022-01-22 (×13): qty 1

## 2022-01-22 MED ORDER — IOHEXOL 350 MG/ML SOLN
75.0000 mL | Freq: Once | INTRAVENOUS | Status: AC | PRN
  Administered 2022-01-22: 75 mL via INTRAVENOUS

## 2022-01-22 MED ORDER — REVEFENACIN 175 MCG/3ML IN SOLN
175.0000 ug | Freq: Every day | RESPIRATORY_TRACT | Status: DC
  Administered 2022-01-22 – 2022-01-28 (×7): 175 ug via RESPIRATORY_TRACT
  Filled 2022-01-22 (×7): qty 3

## 2022-01-22 MED ORDER — DONEPEZIL HCL 10 MG PO TABS
10.0000 mg | ORAL_TABLET | Freq: Every day | ORAL | Status: DC
  Administered 2022-01-22 – 2022-01-28 (×7): 10 mg via ORAL
  Filled 2022-01-22 (×7): qty 1

## 2022-01-22 MED ORDER — ROSUVASTATIN CALCIUM 20 MG PO TABS
20.0000 mg | ORAL_TABLET | Freq: Every day | ORAL | Status: DC
  Administered 2022-01-22 – 2022-01-27 (×6): 20 mg via ORAL
  Filled 2022-01-22 (×6): qty 1

## 2022-01-22 MED ORDER — STROKE: EARLY STAGES OF RECOVERY BOOK
Freq: Once | Status: AC
  Filled 2022-01-22: qty 1

## 2022-01-22 NOTE — TOC Initial Note (Signed)
Transition of Care [facility]) - Initial/Assessment Note    Patient Details  Name: Gabrielle [name] MRN: [id number] Date of Birth: 08-Aug-1930  [facility]) CM/SW Contact:    Leeroy Cha, RN Phone Number: 01/22/2022, 8:25 AM  Clinical Narrative:                  Transition of Care [facility]) Screening Note   Patient Details  Name: Gabrielle [name] Date of Birth: [birth year]   Transition of Care [facility]) CM/SW Contact:    Leeroy Cha, RN Phone Number: 01/22/2022, 8:25 AM    Transition of Care Department [facility]) has reviewed patient and no TOC needs have been identified at this time. We will continue to monitor patient advancement through interdisciplinary progression rounds. If new patient transition needs arise, please place a TOC consult.    Expected Discharge Plan: Home/Self Care Barriers to Discharge: Continued Medical Work up   Patient Goals and CMS Choice Patient states their goals for this hospitalization and ongoing recovery are:: to go home   Choice offered to / list presented to : Patient  Expected Discharge Plan and Services Expected Discharge Plan: Home/Self Care   Discharge Planning Services: CM Consult   Living arrangements for the past 2 months: Single Family Home                                      Prior Living Arrangements/Services Living arrangements for the past 2 months: Single Family Home Lives with:: Self Patient language and need for interpreter reviewed:: Yes Do you feel safe going back to the place where you live?: Yes            Criminal Activity/Legal Involvement Pertinent to Current Situation/Hospitalization: No - Comment as needed  Activities of Daily Living Home Assistive Devices/Equipment: None ADL Screening (condition at time of admission) Patient's cognitive ability adequate to safely complete daily activities?: Yes Is the patient deaf or have difficulty hearing?: Yes Does the patient have difficulty seeing,  even when wearing glasses/contacts?: No Does the patient have difficulty concentrating, remembering, or making decisions?: No Patient able to express need for assistance with ADLs?: No Does the patient have difficulty dressing or bathing?: Yes Independently performs ADLs?: No Does the patient have difficulty walking or climbing stairs?: Yes Weakness of Legs: Both Weakness of Arms/Hands: Both  Permission Sought/Granted                  Emotional Assessment Appearance:: Appears stated age Attitude/Demeanor/Rapport: Engaged Affect (typically observed): Calm Orientation: : Oriented to Self, Oriented to Place, Oriented to  Time, Oriented to Situation Alcohol / Substance Use: Tobacco Use, Alcohol Use Psych Involvement: No (comment)  Admission diagnosis:  Weakness [R53.1] CAP (community acquired pneumonia) [J18.9] Community acquired pneumonia of left lower lobe of lung [J18.9] Patient Active Problem List   Diagnosis Date Noted   CAP (community acquired pneumonia) 01/21/2022   History of breast cancer 01/21/2022   Moderate protein-calorie malnutrition ([place]) 09/25/2020   BMI less than 19,adult 09/25/2020   Fall 09/25/2020   Generalized weakness 09/25/2020   Senile purpura ([place]) 09/25/2020   Statin declined 03/14/2020   Absence of bladder continence 03/11/2020   History of skin cancer 03/11/2020   PAD (peripheral artery disease) ([place]) 11/26/2018   Femoral artery occlusion, right ([place]) 11/26/2018   Vascular dementia ([place]) 08/01/2015   Osteoporosis 12/20/2014   COPD, moderate ([place]) 12/07/2013  Cachexia ([place]) 12/07/2013   Malignant neoplasm of upper-outer quadrant of right breast in female, estrogen receptor positive ([place]) 11/29/2013   Weight loss 10/05/2013   Cigarette smoker 10/05/2013   PCP:  Merryl Hacker, No Pharmacy:   [facility], [place] [address] Phone: [phone] Fax:  [phone]     Social Determinants of Health (SDOH) Interventions    Readmission Risk Interventions   No data to display

## 2022-01-22 NOTE — Assessment & Plan Note (Addendum)
-   no s/s exac - PRN duonebs

## 2022-01-22 NOTE — Assessment & Plan Note (Signed)
-   see CVA

## 2022-01-22 NOTE — Assessment & Plan Note (Addendum)
-   Has been on some treatment prior to admission - Given opacity appreciated in left lower lobe, will continue on treatment course for now and monitor clinical response -Strep pneumo and Legionella urinary antigen negative -Antibiotic course completed in hospital

## 2022-01-22 NOTE — Hospital Course (Signed)
Gabrielle [name] is an 88 yo female with PMH COPD, CVA, anxiety, osteoporosis, macular degeneration, vascular dementia.  She presented with generalized weakness, cough, and fatigue for a few days prior to admission. She had recently been on antibiotics outpatient for a UTI and respiratory infection.  Due to ongoing weakness and persistent cough, she was brought to the ER for further evaluation. See XR was obtained which was concerning for possible left lower lobe infiltrate.  She was started on Rocephin and azithromycin.  She also had some left-sided weakness and underwent further workup with CT head and MRI brain. MRI brain was notable for scattered small acute infarcts in the right ACA territory along with underlying advanced chronic small vessel disease showing mild progression since 2017 MRI. She then underwent CT angio head/neck which was negative for LVO.  Neurology was consulted due to new stroke.  She was started on aspirin and Plavix per recommendations along with statin. PT, OT, SLP were also consulted per workup.

## 2022-01-22 NOTE — Evaluation (Signed)
SLP Cancellation Note  Patient Details Name: Gabrielle [name] MRN: [id number] DOB: [birth year]   Cancelled treatment:       Reason Eval/Treat Not Completed: Other (comment) (pt working with therapy, will continue efforts) Kathleen Lime, MS [facility] SLP [place] [phone] Pager [phone]   Macario Golds 01/22/2022, 4:41 PM

## 2022-01-22 NOTE — Consult Note (Addendum)
Stroke Neurology Consultation Note  Consult Requested by: Dr. Sabino Gasser  Reason for Consult: stroke  Consult Date: 01/22/22   The history was obtained from the medical chart and pt.  During history and examination, all items were able to obtain unless otherwise noted.  History of Present Illness:  Gabrielle [name] is a 88 y.o. Caucasian female with PMH of smoker, COPD, vascular dementia on aricept, breast Ca admitted for generalized weakness. Per pt, she stated that she felt dizzy on and off and generalized weakness for the last 1-2 weeks. Per chart, pt had persistent cough, whole body weakness and decreased appetite. Was given Abx but not helping. In ED and on admission, she was felt to have pneumonia and also found to have left sided weakness, CT no acute finding but old b/l BG/CR lacunar infarcts. MRI showed right ACA territory scattered infarcts. CTA head and neck intracranial atherosclerosis with left subclavian A 70% tstenosis. Neurology was called for evaluation.   LSN: 1-2 weeks ago tPA Given: No: outside window IR: no, no LVO  Past Medical History:  Diagnosis Date   Anxiety    Breast cancer ([place]) 2015   right   Breast cancer of upper-outer quadrant of right female breast ([place]) 10/2013   Anastrazole since 12/2013--plan is to take this for 5 yrs.  No evidence of dz recurrence as of 11/2015 oncol f/u.   Cataract    COPD (chronic obstructive pulmonary disease) (HCC)    Lung nodule, solitary 11/21/2013   6 mm lung nodule R lung seen on chest CT.  R breast mass. Disgnostic MMG pending.    Macular degeneration    Osteoporosis 10/2014   T-score -2.8   Pneumonia    hx of    Retina hole    Skin cancer    Stroke ([place])    Tobacco dependence     Past Surgical History:  Procedure Laterality Date   ABDOMINAL HYSTERECTOMY  1980   (ovaries intact)   [place]   Patient reports approximately 30 years ago having "bladder surgery "in which she describes a  procedure that either stretched her bladder or her urethra dilated secondary to her not being able to void.   BREAST BIOPSY Right    CATARACT EXTRACTION Bilateral    MASTECTOMY Right 2015   MOHS SURGERY     Dr. Sarajane Jews   PARS PLANA VITRECTOMY W/ REPAIR OF MACULAR HOLE Right    RETINAL LASER PROCEDURE     TOTAL MASTECTOMY Right 12/18/2013   Procedure: RIGHT TOTAL MASTECTOMY;  Surgeon: Excell Seltzer, MD;  Location: [facility];  Service: General;  Laterality: Right;    Family History  Problem Relation Age of Onset   Heart attack Mother        Deceased   Cancer Mother        fallopian tube cancer in 84s; deceased 25   Throat cancer Father        Deceased 71; smoker   Diabetes Brother    Prostate cancer Brother 71       Currently 8   Other Brother        Deceased, hemorhhage of pancreas   Leukemia Maternal Aunt    Breast cancer Paternal Aunt        age at diagnosis unknown   Throat cancer Paternal Uncle        Deceased 74s; smoker   Leukemia Paternal Grandmother     Social History:  reports that  she has been smoking cigarettes. She has a 60.00 pack-year smoking history. She has never used smokeless tobacco. She reports current alcohol use of about 2.0 standard drinks of alcohol per week. She reports that she does not use drugs.  Allergies: No Known Allergies  No current facility-administered medications on file prior to encounter.   Current Outpatient Medications on File Prior to Encounter  Medication Sig Dispense Refill   albuterol (PROVENTIL) (2.5 MG/3ML) 0.083% nebulizer solution Take 2.5 mg by nebulization every 4 (four) hours as needed for wheezing or shortness of breath. 75 mL 12   aspirin EC 81 MG tablet Take 81 mg by mouth daily. Swallow whole.     benzonatate (TESSALON) 100 MG capsule Take by mouth 2 (two) times daily as needed for cough.     Cholecalciferol (VITAMIN D3 SUPER STRENGTH) 50 MCG (2000 UT) CAPS Take 2,000 Units by mouth daily.     cyanocobalamin 1000  MCG tablet Take 1,000 mcg by mouth daily.     donepezil (ARICEPT) 10 MG tablet Take 1 tablet (10 mg total) by mouth daily. 90 tablet 0   doxycycline (VIBRA-TABS) 100 MG tablet Take 100 mg by mouth 2 (two) times daily.     Multiple Vitamins-Minerals (PRESERVISION AREDS 2 PO) Take 1 capsule by mouth 2 (two) times daily.     predniSONE (DELTASONE) 20 MG tablet Take 20 mg by mouth daily with breakfast.     revefenacin (YUPELRI) 175 MCG/3ML nebulizer solution Take 3 mLs (175 mcg total) by nebulization daily. 21 mL 0   Cholecalciferol 2000 units CAPS Take 1 capsule (2,000 Units total) by mouth daily. (Patient not taking: Reported on 01/21/2022) 90 each 1    Review of Systems: A full ROS was attempted today and was not able to be performed due to dementia   Physical Examination: Temp:  [98 F (36.7 C)-99.2 F (37.3 C)] 98.7 F (37.1 C) (11/24 0440) Pulse Rate:  [54-72] 63 (11/24 0440) Resp:  [16-29] 18 (11/24 0440) BP: (115-157)/(44-113) 137/62 (11/24 0440) SpO2:  [92 %-98 %] 92 % (11/24 0440) Weight:  [61.2 kg] 61.2 kg (11/23 1349)  General - well nourished, well developed, in no apparent distress but mildly lethargic.    Ophthalmologic - fundi not visualized due to noncooperation.    Cardiovascular - regular rhythm and rate  Neuro - awake, alert, mildly lethargic, eyes open, orientated to age, place, time and people. No aphasia, paucity of speech, following all simple commands. Able to name and repeat. No gaze palsy, tracking bilaterally, visual field full. Mild left facial droop. Tongue midline. Bilateral UEs proximal 4/5 no drift, BUE bicep 4/5, R tricep and finger grip 4/5 however, left tricep and finger grip 3/5. RLE 3+/5 at least, no significant drip. However, left LE 2/5 proximal and 1/5 ankle DF/PF. Sensation symmetrical bilaterally, b/l FTN intact, gait not tested.     Data Reviewed: ECHOCARDIOGRAM COMPLETE  Result Date: 01/22/2022    ECHOCARDIOGRAM REPORT   Patient Name:    Gabrielle [name] Date of Exam: 01/22/2022 Medical Rec #:  [id number]      Height:       64.0 in Accession #:    [id number]     Weight:       135.0 lb Date of Birth:  February 25, 1931       BSA:          1.655 m Patient Age:    62 years       BP:  137/62 mmHg Patient Gender: F              HR:           60 bpm. Exam Location:  Inpatient Procedure: 2D Echo Indications:    stroke  History:        Patient has prior history of Echocardiogram examinations, most                 recent 01/31/2015. COPD and PAD; Risk Factors:Current Smoker.  Sonographer:    Harvie Junior Referring Phys: [place]  1. Left ventricular ejection fraction, by estimation, is 60 to 65%. The left ventricle has normal function. The left ventricle has no regional wall motion abnormalities. Left ventricular diastolic parameters are consistent with Grade II diastolic dysfunction (pseudonormalization).  2. Right ventricular systolic function is normal. The right ventricular size is normal.  3. The mitral valve is degenerative. Mild mitral valve regurgitation. Mild mitral stenosis. The mean mitral valve gradient is 5.0 mmHg with average heart rate of 57 bpm.  4. The aortic valve was not well visualized. There is moderate calcification of the aortic valve. Aortic valve regurgitation is not visualized. Aortic valve sclerosis is present, with no evidence of aortic valve stenosis. Comparison(s): Unable to view prior. Similar to prior report. FINDINGS  Left Ventricle: Left ventricular ejection fraction, by estimation, is 60 to 65%. The left ventricle has normal function. The left ventricle has no regional wall motion abnormalities. The left ventricular internal cavity size was normal in size. There is  no left ventricular hypertrophy. Left ventricular diastolic parameters are consistent with Grade II diastolic dysfunction (pseudonormalization). Right Ventricle: The right ventricular size is normal. No increase in right ventricular wall  thickness. Right ventricular systolic function is normal. Left Atrium: Left atrial size was normal in size. Right Atrium: Right atrial size was normal in size. Pericardium: Trivial pericardial effusion is present. The pericardial effusion is anterior to the right ventricle. Mitral Valve: The mitral valve is degenerative in appearance. Mild mitral valve regurgitation. Mild mitral valve stenosis. MV peak gradient, 11.6 mmHg. The mean mitral valve gradient is 5.0 mmHg with average heart rate of 57 bpm. Tricuspid Valve: The tricuspid valve is grossly normal. Tricuspid valve regurgitation is not demonstrated. No evidence of tricuspid stenosis. Aortic Valve: The aortic valve was not well visualized. There is moderate calcification of the aortic valve. Aortic valve regurgitation is not visualized. Aortic valve sclerosis is present, with no evidence of aortic valve stenosis. Aortic valve mean gradient measures 3.0 mmHg. Aortic valve peak gradient measures 5.7 mmHg. Aortic valve area, by VTI measures 2.31 cm. Pulmonic Valve: The pulmonic valve was not well visualized. Pulmonic valve regurgitation is not visualized. No evidence of pulmonic stenosis. Aorta: The aortic root and ascending aorta are structurally normal, with no evidence of dilitation. IAS/Shunts: No atrial level shunt detected by color flow Doppler.  LEFT VENTRICLE PLAX 2D LVIDd:         4.30 cm     Diastology LVIDs:         2.90 cm     LV e' medial:    4.79 cm/s LV PW:         0.90 cm     LV E/e' medial:  28.6 LV IVS:        0.90 cm     LV e' lateral:   3.92 cm/s LVOT diam:     1.90 cm     LV E/e' lateral: 34.9 LV SV:  65 LV SV Index:   39 LVOT Area:     2.84 cm  LV Volumes (MOD) LV vol d, MOD A2C: 51.4 ml LV vol d, MOD A4C: 56.1 ml LV vol s, MOD A2C: 22.7 ml LV vol s, MOD A4C: 24.1 ml LV SV MOD A2C:     28.7 ml LV SV MOD A4C:     56.1 ml LV SV MOD BP:      30.0 ml RIGHT VENTRICLE RV Basal diam:  3.00 cm RV Mid diam:    2.60 cm TAPSE (M-mode): 1.5 cm  LEFT ATRIUM           Index        RIGHT ATRIUM           Index LA diam:      3.50 cm 2.11 cm/m   RA Area:     10.50 cm LA Vol (A2C): 38.4 ml 23.20 ml/m  RA Volume:   21.70 ml  13.11 ml/m LA Vol (A4C): 32.4 ml 19.57 ml/m  AORTIC VALVE                    PULMONIC VALVE AV Area (Vmax):    2.11 cm     PV Vmax:       0.71 m/s AV Area (Vmean):   2.01 cm     PV Peak grad:  2.0 mmHg AV Area (VTI):     2.31 cm AV Vmax:           119.00 cm/s AV Vmean:          79.700 cm/s AV VTI:            0.280 m AV Peak Grad:      5.7 mmHg AV Mean Grad:      3.0 mmHg LVOT Vmax:         88.60 cm/s LVOT Vmean:        56.500 cm/s LVOT VTI:          0.228 m LVOT/AV VTI ratio: 0.81  AORTA Ao Root diam: 3.20 cm Ao Asc diam:  3.20 cm MITRAL VALVE                  TRICUSPID VALVE MV Area (PHT): 2.09 cm       TR Peak grad:   21.0 mmHg MV Area VTI:   1.02 cm       TR Vmax:        229.00 cm/s MV Peak grad:  11.6 mmHg MV Mean grad:  5.0 mmHg       SHUNTS MV Vmax:       1.70 m/s       Systemic VTI:  0.23 m MV Vmean:      101.0 cm/s     Systemic Diam: 1.90 cm MV Decel Time: 363 msec MR Peak grad:    76.3 mmHg MR Mean grad:    5.0 mmHg MR Vmax:         436.80 cm/s MR Vmean:        97.8 cm/s MR PISA:         2.26 cm MR PISA Eff ROA: 12 mm MR PISA Radius:  0.60 cm MV E velocity: 137.00 cm/s MV A velocity: 156.00 cm/s MV E/A ratio:  0.88 Rudean Haskell MD Electronically signed by Rudean Haskell MD Signature Date/Time: 01/22/2022/10:14:35 AM    Final    CT ANGIO HEAD NECK W WO CM  Result Date: 01/22/2022 CLINICAL DATA:  Neuro deficit, acute, stroke suspected acute CVA. Generalized weakness. Small acute right ACA territory infarcts on MRI. EXAM: CT ANGIOGRAPHY HEAD AND NECK TECHNIQUE: Multidetector CT imaging of the head and neck was performed using the standard protocol during bolus administration of intravenous contrast. Multiplanar CT image reconstructions and MIPs were obtained to evaluate the vascular anatomy. Carotid  stenosis measurements (when applicable) are obtained utilizing NASCET criteria, using the distal internal carotid diameter as the denominator. RADIATION DOSE REDUCTION: This exam was performed according to the departmental dose-optimization program which includes automated exposure control, adjustment of the mA and/or kV according to patient size and/or use of iterative reconstruction technique. CONTRAST:  23m OMNIPAQUE IOHEXOL 350 MG/ML SOLN COMPARISON:  Head CT 01/21/2022.  Head MRI 01/22/2022. FINDINGS: CT HEAD FINDINGS Brain: The small acute right ACA territory infarcts on MRI are not well shown by CT. No acute intracranial hemorrhage, mass, midline shift, or extra-axial fluid collection is identified. Confluent hypodensities in the cerebral white matter bilaterally are unchanged and nonspecific but compatible with severe chronic small vessel ischemic disease. Small chronic infarcts are again noted in the thalami and cerebellum bilaterally. Generalized cerebral atrophy is mild for age. Vascular: Calcified atherosclerosis at the skull base. Skull: No fracture or suspicious osseous lesion. Sinuses/Orbits: Paranasal sinuses and mastoid air cells are clear. Unremarkable orbits. Other: None. Review of the MIP images confirms the above findings CTA NECK FINDINGS Aortic arch: Standard 3 vessel aortic arch with a moderate amount of atherosclerotic plaque. No significant stenosis of the arch vessel origins. Eccentric, predominantly soft plaque in the left subclavian artery proximal to the left vertebral artery origin results in approximately 70% stenosis. Right carotid system: Patent with a moderate amount of predominantly calcified plaque at the carotid bifurcation. No evidence of a significant stenosis or dissection. Left carotid system: Patent with a small to moderate amount of mixed calcified and soft plaque scattered throughout the common carotid artery and at the carotid bifurcation. No evidence of a significant  stenosis or dissection. Vertebral arteries: The vertebral arteries are patent with the left being dominant and without evidence of a significant focal stenosis. Skeleton: Moderate cervical disc and facet degeneration. Other neck: No evidence of cervical lymphadenopathy or mass. Upper chest: Centrilobular emphysema. Review of the MIP images confirms the above findings CTA HEAD FINDINGS Anterior circulation: The internal carotid arteries are patent from skull base to carotid termini with mild atherosclerotic plaque bilaterally not resulting in a significant stenosis. ACAs and MCAs are patent without evidence of a proximal branch occlusion or significant proximal stenosis. There is attenuation of distal right ACA branch vessels compared to the left corresponding to the small acute infarcts on MRI. No aneurysm is identified. Posterior circulation: The intracranial vertebral arteries are patent with the left being strongly dominant. The right vertebral artery is diminutive distal to the PICA origin and does not significantly contribute to the basilar. The right PICA and left AICA appear dominant. Patent SCA origins are seen bilaterally. The basilar artery is patent and mildly small in caliber congenitally without a significant focal stenosis. There are large posterior communicating arteries with absent P1 segments bilaterally. Both PCAs are patent with mild atherosclerotic irregularity and up to mild P2 stenoses bilaterally. No aneurysm is identified. Venous sinuses: As permitted by contrast timing, patent. Anatomic variants: Strongly dominant left vertebral artery. Fetal origin of the PCAs. Review of the MIP images confirms the above findings IMPRESSION: 1. Atherosclerosis without a large vessel occlusion or flow limiting proximal intracranial arterial stenosis. 2. 70%  stenosis of the left subclavian artery proximal to the vertebral artery origin. 3. Cervical carotid artery atherosclerosis without significant stenosis.  4. Aortic Atherosclerosis (ICD10-I70.0) and Emphysema (ICD10-J43.9). Electronically Signed   By: Logan Bores M.D.   On: 01/22/2022 09:42   MR BRAIN WO CONTRAST  Result Date: 01/22/2022 CLINICAL DATA:  88 year old female neurologic deficit. Increased left side weakness. EXAM: MRI HEAD WITHOUT CONTRAST TECHNIQUE: Multiplanar, multiecho pulse sequences of the brain and surrounding structures were obtained without intravenous contrast. COMPARISON:  Head CT yesterday.  Brain MRI 12/17/2015. FINDINGS: Brain: Scattered small foci of restricted diffusion in the right ACA territory, including the superior perirolandic and pre motor area (series 5, image 38), also the right cingulate gyrus, bordering the right corpus callosum series 13, image 21. Faint T2 and FLAIR hyperintensity. No acute hemorrhage or mass effect. No other restricted diffusion. Advanced chronic bilateral cerebral white matter disease. Chronic lacunar infarcts in the bilateral thalami and brainstem. Occasional small chronic infarcts in the bilateral cerebellum. And on SWI chronic blood products in the thalami (mostly the right) appears stable. But small chronic microhemorrhages in the anterior left corona radiata and left cerebellum are new since 2017. No midline shift, mass effect, evidence of mass lesion, ventriculomegaly, extra-axial collection or acute intracranial hemorrhage. Cervicomedullary junction and pituitary are within normal limits. Vascular: Major intracranial vascular flow voids are stable since 2017. Skull and upper cervical spine: Visible cervical spine remains normal for age. Visualized bone marrow signal is within normal limits. Sinuses/Orbits: Stable, negative. Other: Mastoids remain clear. Grossly normal visible internal auditory structures. Negative visible scalp and face. IMPRESSION: 1. Scattered small acute infarcts in the Right ACA territory, including the right cingulate gyrus, pre motor and perirolandic areas. No associated  hemorrhage or mass effect. 2. Underlying advanced chronic small vessel disease, with mild progression since a 2017 MRI. Electronically Signed   By: Genevie Ann M.D.   On: 01/22/2022 06:42    Assessment: 88 y.o. female with PMH of smoker, COPD, vascular dementia on aricept, breast Ca admitted for cough and generalized weakness  for the last 1-2 weeks. She was found to have possible pneumonia and also left sided weakness, CT no acute finding but old b/l BG/CR lacunar infarcts. MRI showed right ACA territory scattered infarcts. CTA head and neck intracranial atherosclerosis with left subclavian A 70% tstenosis. LE venous doppler negative for DVT. On Abx. Will continue her ASA and add plavix for DAPT. Started on statin.   Etiology of pt stroke most likely due to intracranial atherosclerosis in the setting of pneumonia and decreased po intake. However, cardioembolic source can not be completely ruled out. Will recommend 30 day cardiac monitoring as outpt to rule out afib. Continue DAPT for 3 weeks and then plavix alone. Continue statin. PT/OT pending.   Stroke Risk Factors - smoking  Plan: Neuro checks Telemetry monitoring Recommend 30 day cardiac event monitoring as outpt to rule out afib DAPT for 3 weeks and then plavix alone Continue statin  PT/OT/speech consult Stroke risk factor modification Discussed with Dr. Sabino Gasser  Neurology will sign off. Please call with questions. Pt will follow up with Dr. Posey Pronto on 01/29/22. Thanks for the consult.   Thank you for this consultation and allowing [place] to participate in the care of this patient.  Rosalin Hawking, MD PhD Stroke Neurology 01/22/2022 2:45 PM

## 2022-01-22 NOTE — Progress Notes (Signed)
Bilateral lower extremity venous duplex has been completed. Preliminary results can be found in CV Proc through chart review.  Results were given to Dr. Erlinda Hong.  01/22/22 11:09 AM Gabrielle [name] RVT

## 2022-01-22 NOTE — Evaluation (Signed)
Occupational Therapy Evaluation Patient Details Name: Gabrielle [name] MRN: [id number] DOB: 05/13/1930 Today's Date: 01/22/2022   History of Present Illness Gabrielle [name] is an 88 yo female with PMH COPD, CVA, anxiety, osteoporosis, macular degeneration, vascular dementia.  She presented with generalized weakness, cough, and fatigue for a few days prior to admission. CXR was obtained which was concerning for possible left lower lobe infiltrate.  he also had some left-sided weakness and underwent further workup with CT head and MRI brain.  MRI brain was notable for scattered small acute infarcts in the right ACA territory   Clinical Impression   Gabrielle [name] is an 88 year old woman who lives in [place] with her brother at [facility]. She has been receiving OT and SLP and has had some difficulty with maintaining hygiene. Her daughter reports she could typically walk outside of the facility to smoke without a device. Today she presents with left sided hemiparesis with LLE deficits greater than left upper extremity. She is able to functionally use left arm though her movements are sloweer and she has coordination deficits. She could not lift her left leg off the bed, could not advance it in standing. She exhibited poor sitting and standing balance. She may have some visual deficits but they will have to be assessed in a functional setting. She requires significant assistance for ADLs. Patient will benefit from skilled OT services while in hospital to improve deficits and learn compensatory strategies as needed in order to return to PLOF.  Recommend short term rehab at discharge prior to return home.      Recommendations for follow up therapy are one component of a multi-disciplinary discharge planning process, led by the attending physician.  Recommendations may be updated based on patient status, additional functional criteria and insurance authorization.   Follow Up Recommendations   Skilled nursing-short term rehab (<3 hours/day)     Assistance Recommended at Discharge Frequent or constant Supervision/Assistance  Patient can return home with the following A lot of help with walking and/or transfers;A lot of help with bathing/dressing/bathroom;Assistance with cooking/housework;Direct supervision/assist for financial management;Assist for transportation;Help with stairs or ramp for entrance;Direct supervision/assist for medications management    Functional Status Assessment  Patient has had a recent decline in their functional status and demonstrates the ability to make significant improvements in function in a reasonable and predictable amount of time.  Equipment Recommendations  None recommended by OT    Recommendations for Other Services       Precautions / Restrictions Precautions Precautions: Fall Restrictions Weight Bearing Restrictions: No      Mobility Bed Mobility Overal bed mobility: Needs Assistance Bed Mobility: Supine to Sit, Sit to Supine     Supine to sit: Max assist, HOB elevated Sit to supine: Mod assist, +2 for physical assistance        Transfers Overall transfer level: Needs assistance Equipment used: Rolling walker (2 wheels) Transfers: Sit to/from Stand Sit to Stand: Mod assist, +2 physical assistance           General transfer comment: Mod x 2 to stand. Unable to take an actual step due to LLE deficits. Needed physical assistance to maintain upright posture.      Balance Overall balance assessment: Needs assistance Sitting-balance support: Bilateral upper extremity supported, Feet supported Sitting balance-Leahy Scale: Poor   Postural control: Left lateral lean Standing balance support: During functional activity, Reliant on assistive device for balance Standing balance-Leahy Scale: Poor Standing balance comment: Reliant on assistance  from therapist to stay  upright                           ADL either  performed or assessed with clinical judgement   ADL Overall ADL's : Needs assistance/impaired Eating/Feeding: Set up;Bed level   Grooming: Set up;Bed level   Upper Body Bathing: Minimal assistance;Bed level   Lower Body Bathing: Total assistance;Bed level   Upper Body Dressing : Moderate assistance;Bed level   Lower Body Dressing: Bed level;Maximal assistance   Toilet Transfer: Total assistance;+2 for physical assistance   Toileting- Clothing Manipulation and Hygiene: Bed level;Total assistance Toileting - Clothing Manipulation Details (indicate cue type and reason): to clean up after incontinent of BM     Functional mobility during ADLs: Moderate assistance;+2 for physical assistance;+2 for safety/equipment;Rolling walker (2 wheels)       Vision   Vision Assessment?: No apparent visual deficits;Vision impaired- to be further tested in functional context Additional Comments: Daughter reports she was seeing more pills in cup than were actually there. Able to counter fingers in 3/4 quadrants. Stated 4 fingers instead of 3 in LL quadrant.     Perception     Praxis      Pertinent Vitals/Pain Pain Assessment Pain Assessment: No/denies pain     Hand Dominance Right   Extremity/Trunk Assessment Upper Extremity Assessment Upper Extremity Assessment: RUE deficits/detail;LUE deficits/detail RUE Deficits / Details: WFL ROM, functional strength RUE Sensation: WNL RUE Coordination: WNL LUE Deficits / Details: WFL ROM, decreased strength and coordination compared to right, LUE bradykinetic LUE Coordination: decreased fine motor;decreased gross motor   Lower Extremity Assessment Lower Extremity Assessment: Defer to PT evaluation (Unable to lift LLE or advance  for step, unable to flex ankle)   Cervical / Trunk Assessment Cervical / Trunk Assessment: Normal   Communication Communication Communication: No difficulties   Cognition Arousal/Alertness: Awake/alert Behavior  During Therapy: WFL for tasks assessed/performed Overall Cognitive Status: History of cognitive impairments - at baseline                                 General Comments: Dementia at baseline. Able to follow simple commands.     General Comments       Exercises     Shoulder Instructions      Home Living Family/patient expects to be discharged to:: Private residence (Independent Living) Living Arrangements:  (brother) Available Help at Discharge: Family Type of Home: Apartment Home Access: Level entry     Home Layout: One level     Bathroom Shower/Tub: Occupational psychologist: Handicapped height     [place]: Shower seat;Grab bars - tub/shower;Grab bars - toilet          Prior Functioning/Environment               Mobility Comments: no device, able to ambulate outside to smoke ADLs Comments: grossly independent with ADLs - poor hygiene and getting OT for the last 4 weeks        OT Problem List: Decreased strength;Decreased range of motion;Decreased activity tolerance;Impaired balance (sitting and/or standing);Impaired vision/perception;Decreased coordination;Decreased cognition;Decreased safety awareness;Decreased knowledge of use of DME or AE;Decreased knowledge of precautions;Impaired UE functional use      OT Treatment/Interventions: Self-care/ADL training;Therapeutic exercise;DME and/or AE instruction;Therapeutic activities;Cognitive remediation/compensation;Balance training;Patient/family education;Visual/perceptual remediation/compensation;Modalities;Neuromuscular education    OT Goals(Current goals can be found in the care plan section)  Acute Rehab OT Goals Patient Stated Goal: improve functional abilities OT Goal Formulation: With patient/family Time For Goal Achievement: 02/05/22 Potential to Achieve Goals: Fair  OT Frequency: Min 2X/week    Co-evaluation              AM-PAC OT "6 Clicks" Daily Activity      Outcome Measure Help from another person eating meals?: A Little Help from another person taking care of personal grooming?: A Little Help from another person toileting, which includes using toliet, bedpan, or urinal?: Total Help from another person bathing (including washing, rinsing, drying)?: A Lot Help from another person to put on and taking off regular upper body clothing?: A Lot Help from another person to put on and taking off regular lower body clothing?: A Lot 6 Click Score: 13   End of Session Equipment Utilized During Treatment: Rolling walker (2 wheels);Gait belt Nurse Communication: Mobility status (do not transfer out of bed)  Activity Tolerance: Patient tolerated treatment well Patient left: in bed;with call bell/phone within reach;with bed alarm set;with family/visitor present  OT Visit Diagnosis: Hemiplegia and hemiparesis Hemiplegia - Right/Left: Left Hemiplegia - dominant/non-dominant: Non-Dominant Hemiplegia - caused by: Cerebral infarction                Time: 1437-1500 OT Time Calculation (min): 23 min Charges:  OT General Charges $OT Visit: 1 Visit OT Evaluation $OT Eval Moderate Complexity: 1 Mod  Gustavo Lah, OTR/L Acute Care Rehab Services  Office [phone]   Lenward Chancellor 01/22/2022, 3:58 PM

## 2022-01-22 NOTE — Progress Notes (Addendum)
Progress Note    Gabrielle [name]   MPN:[id number]  DOB: 09/16/1930  DOA: 01/21/2022     0 PCP: Pcp, No  Initial CC: weakness, cough  Hospital Course: Gabrielle [name] is an 88 yo female with PMH COPD, CVA, anxiety, osteoporosis, macular degeneration, vascular dementia.  She presented with generalized weakness, cough, and fatigue for a few days prior to admission. She had recently been on antibiotics outpatient for a UTI and respiratory infection.  Due to ongoing weakness and persistent cough, she was brought to the ER for further evaluation. See XR was obtained which was concerning for possible left lower lobe infiltrate.  She was started on Rocephin and azithromycin.  She also had some left-sided weakness and underwent further workup with CT head and MRI brain. MRI brain was notable for scattered small acute infarcts in the right ACA territory along with underlying advanced chronic small vessel disease showing mild progression since 2017 MRI. She then underwent CT angio head/neck which was negative for LVO.  Neurology was consulted due to new stroke.  She was started on aspirin and Plavix per recommendations along with statin. PT, OT, SLP were also consulted per workup.  Interval History:  Evaluated this morning in her room.  Notable left lower extremity weakness.  Still has generalized weakness as well.  Reviewed findings of MRI brain with patient.  Also called and updated daughter as well.  She is driving from [place] to visit.  Assessment and Plan: * CAP (community acquired pneumonia) - Has been on some treatment prior to admission - Given opacity appreciated in left lower lobe, will continue on treatment course for now and monitor clinical response -Strep pneumo urinary antigen negative.  Follow-up Legionella urinary antigen  Acute CVA (cerebrovascular accident) [facility]) - Patient presented with generalized weakness but worse on the left - MRI brain reveals scattered small acute infarcts  involving right ACA territory - Neurology has evaluated - Follow-up PT, OT, SLP evals - Echo reviewed, no PFO.  EF 60 to 00%, grade 2 diastolic dysfunction, mild MV stenosis - Lower extremity duplex negative for DVT - TSH normal, 2.155 - Follow-up A1c - LDL 116.  Started on statin per neurology recommendations - asa and plavix for 21 days then plavix alone per neuro recs - heart monitor for 30 days recommended per neuro as well   Generalized weakness - see CVA  COPD, moderate ([place]) - no s/s exac - PRN duonebs   Old records reviewed in assessment of this patient  Antimicrobials: Azithro 11/23 >> current Rocephin 11/23 >> current   DVT prophylaxis:  SCDs Start: 01/21/22 2032   Code Status:   Code Status: Full Code  Mobility Assessment (last 72 hours)     Mobility Assessment     Row Name 01/22/22 8676 01/21/22 2030         Does patient have an order for bedrest or is patient medically unstable No - Continue assessment No - Continue assessment      What is the highest level of mobility based on the progressive mobility assessment? Level 3 (Stands with assist) - Balance while standing  and cannot march in place Level 4 (Walks with assist in room) - Balance while marching in place and cannot step forward and back - Complete      Is the above level different from baseline mobility prior to current illness? Yes - Recommend PT order --               Barriers  to discharge:  Disposition Plan:  Pending PT eval Status is: Inpt  Objective: Blood pressure (!) 142/74, pulse 61, temperature 98.2 F (36.8 C), temperature source Oral, resp. rate 16, height '5\' 4"'$  (1.626 m), weight 61.2 kg, SpO2 97 %.  Examination:  Physical Exam Constitutional:      General: She is not in acute distress.    Comments: Fatigued appearing  HENT:     Head: Normocephalic and atraumatic.     Mouth/Throat:     Mouth: Mucous membranes are moist.  Eyes:     Extraocular Movements: Extraocular  movements intact.  Cardiovascular:     Rate and Rhythm: Normal rate and regular rhythm.  Pulmonary:     Effort: Pulmonary effort is normal. No respiratory distress.     Breath sounds: Rhonchi present. No wheezing.  Abdominal:     General: Bowel sounds are normal. There is no distension.     Palpations: Abdomen is soft.     Tenderness: There is no abdominal tenderness.  Musculoskeletal:        General: Normal range of motion.     Cervical back: Normal range of motion and neck supple.  Skin:    General: Skin is warm and dry.  Neurological:     Mental Status: She is alert.     Comments: 3/5 LLE strength; no paresthesias. No aphasia or dysarthria.   Psychiatric:        Mood and Affect: Mood normal.      Consultants:  Neurology  Procedures:    Data Reviewed: Results for orders placed or performed during the hospital encounter of 01/21/22 (from the past 24 hour(s))  Basic metabolic panel     Status: Abnormal   Collection Time: 01/21/22  2:04 PM  Result Value Ref Range   Sodium 144 135 - 145 mmol/L   Potassium 3.8 3.5 - 5.1 mmol/L   Chloride 109 98 - 111 mmol/L   CO2 28 22 - 32 mmol/L   Glucose, Bld 97 70 - 99 mg/dL   BUN 38 (H) 8 - 23 mg/dL   Creatinine, Ser 0.98 0.44 - 1.00 mg/dL   Calcium 9.0 8.9 - 10.3 mg/dL   GFR, Estimated 55 (L) >60 mL/min   Anion gap 7 5 - 15  CBC with Differential     Status: Abnormal   Collection Time: 01/21/22  2:04 PM  Result Value Ref Range   WBC 9.6 4.0 - 10.5 K/uL   RBC 3.67 (L) 3.87 - 5.11 MIL/uL   Hemoglobin 11.9 (L) 12.0 - 15.0 g/dL   HCT 36.4 36.0 - 46.0 %   MCV 99.2 80.0 - 100.0 fL   MCH 32.4 26.0 - 34.0 pg   MCHC 32.7 30.0 - 36.0 g/dL   RDW 13.5 11.5 - 15.5 %   Platelets 353 150 - 400 K/uL   nRBC 0.0 0.0 - 0.2 %   Neutrophils Relative % 70 %   Neutro Abs 6.7 1.7 - 7.7 K/uL   Lymphocytes Relative 19 %   Lymphs Abs 1.8 0.7 - 4.0 K/uL   Monocytes Relative 9 %   Monocytes Absolute 0.8 0.1 - 1.0 K/uL   Eosinophils Relative 1 %    Eosinophils Absolute 0.1 0.0 - 0.5 K/uL   Basophils Relative 1 %   Basophils Absolute 0.1 0.0 - 0.1 K/uL   Immature Granulocytes 0 %   Abs Immature Granulocytes 0.04 0.00 - 0.07 K/uL  Lactic acid, plasma     Status: None   Collection  Time: 01/21/22  2:04 PM  Result Value Ref Range   Lactic Acid, Venous 1.2 0.5 - 1.9 mmol/L  Culture, blood (routine x 2)     Status: None (Preliminary result)   Collection Time: 01/21/22  2:08 PM   Specimen: BLOOD  Result Value Ref Range   Specimen Description      BLOOD BLOOD RIGHT FOREARM Performed at [facility] [address] [place], [place] [address]    Special Requests      BOTTLES DRAWN AEROBIC AND ANAEROBIC Blood Culture adequate volume Performed at [facility] [address], [place], [place] [address]    Culture      NO GROWTH < 24 HOURS Performed at [facility] [address] [place], [place] [address]    Report Status PENDING   Culture, blood (routine x 2)     Status: None (Preliminary result)   Collection Time: 01/21/22  2:16 PM   Specimen: BLOOD  Result Value Ref Range   Specimen Description      BLOOD BLOOD LEFT FOREARM Performed at [facility] [address] [place], [place] [address]    Special Requests      BOTTLES DRAWN AEROBIC AND ANAEROBIC Blood Culture adequate volume Performed at [facility] [address], [place], [place] [address]    Culture      NO GROWTH < 24 HOURS Performed at [facility] [address] [place], [place] [address]    Report Status PENDING   Resp Panel by RT-PCR (Flu A&B, Covid) Anterior Nasal Swab     Status: None   Collection Time: 01/21/22  2:25 PM   Specimen: Anterior Nasal Swab  Result Value Ref Range   SARS Coronavirus 2 by RT PCR NEGATIVE NEGATIVE   Influenza A by PCR NEGATIVE NEGATIVE   Influenza B by PCR NEGATIVE NEGATIVE  Lactic acid, plasma     Status: None   Collection Time:  01/21/22  4:27 PM  Result Value Ref Range   Lactic Acid, Venous 1.1 0.5 - 1.9 mmol/L  Strep pneumoniae urinary antigen     Status: None   Collection Time: 01/21/22  7:58 PM  Result Value Ref Range   Strep Pneumo Urinary Antigen NEGATIVE NEGATIVE  Urinalysis, Routine w reflex microscopic Urine, Clean Catch     Status: None   Collection Time: 01/21/22  8:00 PM  Result Value Ref Range   Color, Urine YELLOW YELLOW   APPearance CLEAR CLEAR   Specific Gravity, Urine 1.017 1.005 - 1.030   pH 5.0 5.0 - 8.0   Glucose, UA NEGATIVE NEGATIVE mg/dL   Hgb urine dipstick NEGATIVE NEGATIVE   Bilirubin Urine NEGATIVE NEGATIVE   Ketones, ur NEGATIVE NEGATIVE mg/dL   Protein, ur NEGATIVE NEGATIVE mg/dL   Nitrite NEGATIVE NEGATIVE   Leukocytes,Ua NEGATIVE NEGATIVE  Comprehensive metabolic panel     Status: Abnormal   Collection Time: 01/22/22  3:25 AM  Result Value Ref Range   Sodium 143 135 - 145 mmol/L   Potassium 3.9 3.5 - 5.1 mmol/L   Chloride 110 98 - 111 mmol/L   CO2 27 22 - 32 mmol/L   Glucose, Bld 97 70 - 99 mg/dL   BUN 36 (H) 8 - 23 mg/dL   Creatinine, Ser 0.70 0.44 - 1.00 mg/dL   Calcium 8.3 (L) 8.9 - 10.3 mg/dL   Total Protein 5.6 (L) 6.5 - 8.1 g/dL   Albumin 2.6 (L) 3.5 -  5.0 g/dL   AST 13 (L) 15 - 41 U/L   ALT 10 0 - 44 U/L   Alkaline Phosphatase 70 38 - 126 U/L   Total Bilirubin 0.4 0.3 - 1.2 mg/dL   GFR, Estimated >60 >60 mL/min   Anion gap 6 5 - 15  CBC     Status: Abnormal   Collection Time: 01/22/22  3:25 AM  Result Value Ref Range   WBC 8.2 4.0 - 10.5 K/uL   RBC 3.47 (L) 3.87 - 5.11 MIL/uL   Hemoglobin 11.1 (L) 12.0 - 15.0 g/dL   HCT 33.9 (L) 36.0 - 46.0 %   MCV 97.7 80.0 - 100.0 fL   MCH 32.0 26.0 - 34.0 pg   MCHC 32.7 30.0 - 36.0 g/dL   RDW 13.5 11.5 - 15.5 %   Platelets 336 150 - 400 K/uL   nRBC 0.0 0.0 - 0.2 %  Lipid panel     Status: Abnormal   Collection Time: 01/22/22  3:25 AM  Result Value Ref Range   Cholesterol 157 0 - 200 mg/dL   Triglycerides 83  <150 mg/dL   HDL 24 (L) >40 mg/dL   Total CHOL/HDL Ratio 6.5 RATIO   VLDL 17 0 - 40 mg/dL   LDL Cholesterol 116 (H) 0 - 99 mg/dL  TSH     Status: None   Collection Time: 01/22/22  3:25 AM  Result Value Ref Range   TSH 2.155 0.350 - 4.500 uIU/mL    I have Reviewed nursing notes, Vitals, and Lab results since pt's last encounter. Pertinent lab results : see above I have ordered test including BMP, CBC, Mg I have reviewed the last note from staff over past 24 hours I have discussed pt's care plan and test results with nursing staff, case manager  Time spent: Greater than 50% of the 55 minute visit was spent in counseling/coordination of care for the patient as laid out in the A&P.    LOS: 0 days   Dwyane Dee, MD [facility] 01/22/2022, 1:23 PM

## 2022-01-22 NOTE — Progress Notes (Signed)
  Echocardiogram 2D Echocardiogram has been performed.  Gabrielle [name] 01/22/2022, 9:57 AM

## 2022-01-22 NOTE — Assessment & Plan Note (Addendum)
-   Patient presented with generalized weakness but worse on the LLE - MRI brain reveals scattered small acute infarcts involving right ACA territory - Neurology has evaluated - Echo reviewed, no PFO.  EF 60 to 46%, grade 2 diastolic dysfunction, mild MV stenosis - Lower extremity duplex negative for DVT - TSH normal, 2.155 - A1c 5.8% - LDL 116.  Started on statin per neurology recommendations - asa and plavix for 21 days then plavix alone per neuro recs - heart monitor for 30 days recommended per neuro as well  - LLE worsening today with 0/5 strength; possible that patient may require LTC if doesn't regain much function in rehab which seems like a possibility; have discussed concerns with her daughter, [name], as well

## 2022-01-23 DIAGNOSIS — I639 Cerebral infarction, unspecified: Secondary | ICD-10-CM | POA: Diagnosis not present

## 2022-01-23 DIAGNOSIS — J189 Pneumonia, unspecified organism: Secondary | ICD-10-CM | POA: Diagnosis not present

## 2022-01-23 LAB — CBC WITH DIFFERENTIAL/PLATELET
Abs Immature Granulocytes: 0.03 10*3/uL (ref 0.00–0.07)
Basophils Absolute: 0.1 10*3/uL (ref 0.0–0.1)
Basophils Relative: 1 %
Eosinophils Absolute: 0.2 10*3/uL (ref 0.0–0.5)
Eosinophils Relative: 2 %
HCT: 35.8 % — ABNORMAL LOW (ref 36.0–46.0)
Hemoglobin: 11.6 g/dL — ABNORMAL LOW (ref 12.0–15.0)
Immature Granulocytes: 0 %
Lymphocytes Relative: 18 %
Lymphs Abs: 1.6 10*3/uL (ref 0.7–4.0)
MCH: 31.8 pg (ref 26.0–34.0)
MCHC: 32.4 g/dL (ref 30.0–36.0)
MCV: 98.1 fL (ref 80.0–100.0)
Monocytes Absolute: 0.8 10*3/uL (ref 0.1–1.0)
Monocytes Relative: 9 %
Neutro Abs: 6.2 10*3/uL (ref 1.7–7.7)
Neutrophils Relative %: 70 %
Platelets: 319 10*3/uL (ref 150–400)
RBC: 3.65 MIL/uL — ABNORMAL LOW (ref 3.87–5.11)
RDW: 13.2 % (ref 11.5–15.5)
WBC: 8.9 10*3/uL (ref 4.0–10.5)
nRBC: 0 % (ref 0.0–0.2)

## 2022-01-23 LAB — BASIC METABOLIC PANEL
Anion gap: 8 (ref 5–15)
BUN: 27 mg/dL — ABNORMAL HIGH (ref 8–23)
CO2: 25 mmol/L (ref 22–32)
Calcium: 8.3 mg/dL — ABNORMAL LOW (ref 8.9–10.3)
Chloride: 107 mmol/L (ref 98–111)
Creatinine, Ser: 0.75 mg/dL (ref 0.44–1.00)
GFR, Estimated: >60 mL/min (ref >60)
Glucose, Bld: 101 mg/dL — ABNORMAL HIGH (ref 70–99)
Potassium: 3.9 mmol/L (ref 3.5–5.1)
Sodium: 140 mmol/L (ref 135–145)

## 2022-01-23 LAB — HEMOGLOBIN A1C
Hgb A1c MFr Bld: 5.8 % — ABNORMAL HIGH (ref 4.8–5.6)
Mean Plasma Glucose: 120 mg/dL

## 2022-01-23 LAB — MAGNESIUM: Magnesium: 2 mg/dL (ref 1.7–2.4)

## 2022-01-23 NOTE — NC FL2 (Signed)
[place] LEVEL OF CARE SCREENING TOOL     IDENTIFICATION  Patient Name: Gabrielle [name] Birthdate: 12/24/1930 Sex: female Admission Date (Current Location): 01/21/2022  [facility] and [place] Number:  Herbalist and Address:  [facility],  [place], [place]      Provider Number: [id number]  Attending Physician Name and Address:  Dwyane Dee, MD  Relative Name and Phone Number:  [name] [phone]    Current Level of Care: Hospital Recommended Level of Care: Nursing Facility Prior Approval Number:    Date Approved/Denied: 01/23/22 PASRR Number: [id number]  Discharge Plan: SNF    Current Diagnoses: Patient Active Problem List   Diagnosis Date Noted   Pressure injury of skin 01/22/2022   Acute CVA (cerebrovascular accident) ([place]) 01/22/2022   CAP (community acquired pneumonia) 01/21/2022   History of breast cancer 01/21/2022   Moderate protein-calorie malnutrition ([place]) 09/25/2020   BMI less than 19,adult 09/25/2020   Fall 09/25/2020   Generalized weakness 09/25/2020   Senile purpura ([place]) 09/25/2020   Statin declined 03/14/2020   Absence of bladder continence 03/11/2020   History of skin cancer 03/11/2020   PAD (peripheral artery disease) ([place]) 11/26/2018   Femoral artery occlusion, right ([place]) 11/26/2018   Vascular dementia ([place]) 08/01/2015   Osteoporosis 12/20/2014   COPD, moderate ([place]) 12/07/2013   Cachexia ([place]) 12/07/2013   Malignant neoplasm of upper-outer quadrant of right breast in female, estrogen receptor positive ([place]) 11/29/2013   Weight loss 10/05/2013   Cigarette smoker 10/05/2013    Orientation RESPIRATION BLADDER Height & Weight     Self, Time, Situation, Place  Normal Continent Weight: 135 lb (61.2 kg) Height:  '5\' 4"'$  (162.6 cm)  BEHAVIORAL SYMPTOMS/MOOD NEUROLOGICAL BOWEL NUTRITION STATUS      Continent    AMBULATORY STATUS COMMUNICATION OF NEEDS Skin   Limited Assist  Verbally Normal                       Personal Care Assistance Level of Assistance  Bathing, Feeding, Dressing Bathing Assistance: Limited assistance Feeding assistance: Limited assistance Dressing Assistance: Limited assistance     Functional Limitations Info  Sight, Hearing, Speech Sight Info: Adequate Hearing Info: Adequate Speech Info: Adequate    SPECIAL CARE FACTORS FREQUENCY                       Contractures Contractures Info: Not present    Additional Factors Info  Code Status, Allergies Code Status Info: Full Allergies Info: NONe           Current Medications (01/23/2022):  This is the current hospital active medication list Current Facility-Administered Medications  Medication Dose Route Frequency Provider Last Rate Last Admin   0.9 %  sodium chloride infusion   Intravenous Continuous Marylyn Ishihara, Tyrone A, DO 75 mL/hr at 01/23/22 0447 New Bag at 01/23/22 0447   acetaminophen (TYLENOL) tablet 650 mg  650 mg Oral Q6H PRN Cherylann Ratel A, DO       Or   acetaminophen (TYLENOL) suppository 650 mg  650 mg Rectal Q6H PRN Marylyn Ishihara, Tyrone A, DO       aspirin EC tablet 81 mg  81 mg Oral Daily Dwyane Dee, MD   81 mg at 01/23/22 0957   azithromycin (ZITHROMAX) 500 mg in sodium chloride 0.9 % 250 mL IVPB  500 mg Intravenous Q24H Kyle, Tyrone A, DO 250 mL/hr at 01/23/22 1402 500 mg at 01/23/22  1402   cefTRIAXone (ROCEPHIN) 2 g in sodium chloride 0.9 % 100 mL IVPB  2 g Intravenous Q24H Kyle, Tyrone A, DO 200 mL/hr at 01/23/22 1310 2 g at 01/23/22 1310   clopidogrel (PLAVIX) tablet 75 mg  75 mg Oral Daily Rosalin Hawking, MD   75 mg at 01/23/22 0957   cyanocobalamin (VITAMIN B12) tablet 1,000 mcg  1,000 mcg Oral Daily Dwyane Dee, MD   1,000 mcg at 01/23/22 0957   donepezil (ARICEPT) tablet 10 mg  10 mg Oral Daily Dwyane Dee, MD   10 mg at 01/23/22 0957   guaiFENesin (MUCINEX) 12 hr tablet 600 mg  600 mg Oral BID Marylyn Ishihara, Tyrone A, DO   600 mg at 01/23/22 0957    influenza vaccine adjuvanted (FLUAD) injection 0.5 mL  0.5 mL Intramuscular Prior to discharge Marylyn Ishihara, Tyrone A, DO       ipratropium-albuterol (DUONEB) 0.5-2.5 (3) MG/3ML nebulizer solution 3 mL  3 mL Nebulization Q6H PRN Marylyn Ishihara, Tyrone A, DO       multivitamin (PROSIGHT) tablet 1 tablet  1 tablet Oral BID Dwyane Dee, MD   1 tablet at 01/23/22 0957   ondansetron (ZOFRAN) tablet 4 mg  4 mg Oral Q6H PRN Cherylann Ratel A, DO       Or   ondansetron (ZOFRAN) injection 4 mg  4 mg Intravenous Q6H PRN Marylyn Ishihara, Tyrone A, DO       revefenacin (YUPELRI) nebulizer solution 175 mcg  175 mcg Nebulization Daily Dwyane Dee, MD   175 mcg at 01/23/22 0757   rosuvastatin (CRESTOR) tablet 20 mg  20 mg Oral q1800 Rosalin Hawking, MD   20 mg at 01/22/22 1810     Discharge Medications: Please see discharge summary for a list of discharge medications.  Relevant Imaging Results:  Relevant Lab Results:   Additional Information [place] Rilla Buckman LCSW-A SSI [id number]  Rodney Booze, LCSW

## 2022-01-23 NOTE — Evaluation (Signed)
Physical Therapy Evaluation Patient Details Name: Gabrielle [name] MRN: [id number] DOB: 18-Sep-1930 Today's Date: 01/23/2022  History of Present Illness  Gabrielle [name] is an 88 yo female who presented 01/21/22 with weakness, cough and fatigue.  Pt also with L sided weakness.  Pt admitted for CAP and found to have small scattered acute infarcts in R ACA territory.  Pt with PMH COPD, CVA, anxiety, osteoporosis, macular degeneration, vascular dementia.  Clinical Impression  Pt admitted with above diagnosis. At baseline, pt resides at ILF with her brother.  She is normally ambulatory without AD.  Today, pt with flat affect, follows commands with increased time, and was mod-mod A of 2 for all transfers.  She demonstrates slight L UE weakness and 0/5 strength on L LE.  Pt participated well with PT but is unable to weight bear on L LE.   Pt currently with functional limitations due to the deficits listed below (see PT Problem List). Pt will benefit from skilled PT to increase their independence and safety with mobility to allow discharge to the venue listed below.          Recommendations for follow up therapy are one component of a multi-disciplinary discharge planning process, led by the attending physician.  Recommendations may be updated based on patient status, additional functional criteria and insurance authorization.  Follow Up Recommendations Skilled nursing-short term rehab (<3 hours/day) Can patient physically be transported by private vehicle: No    Assistance Recommended at Discharge Frequent or constant Supervision/Assistance  Patient can return home with the following  Two people to help with walking and/or transfers;Two people to help with bathing/dressing/bathroom    Equipment Recommendations Other (comment) (defer to post acute)  Recommendations for Other Services       Functional Status Assessment Patient has had a recent decline in their functional status and demonstrates the ability  to make significant improvements in function in a reasonable and predictable amount of time.     Precautions / Restrictions Precautions Precautions: Fall Restrictions Weight Bearing Restrictions: No      Mobility  Bed Mobility Overal bed mobility: Needs Assistance Bed Mobility: Supine to Sit, Sit to Supine     Supine to sit: Mod assist, +2 for safety/equipment     General bed mobility comments: Required assist for L LE and to lift trunk; increased time with cues to scoot forward    Transfers Overall transfer level: Needs assistance Equipment used: Rolling walker (2 wheels), 2 person hand held assist Transfers: Sit to/from Stand, Bed to chair/wheelchair/BSC Sit to Stand: Mod assist, +2 physical assistance Stand pivot transfers: Mod assist, +2 physical assistance         General transfer comment: Mod x 2 to stand with cues to push up - sit to stand x 5 during session.  For pivot pt able to support weight but not able to assist with pivot.  Not able to weight bear on L LE    Ambulation/Gait             Pre-gait activities: Standing weight shifting onto L LE with mod A x 2 , L knee blocked.  Performed 5 x 2.  When static standing provided anterior to posterior pressure knee and posterior to Anterior upward at buttock/hip to promote posture on L side.    Stairs            Wheelchair Mobility    Modified Rankin (Stroke Patients Only) Modified Rankin (Stroke Patients Only) Pre-Morbid Rankin Score: Slight disability Modified  Rankin: Severe disability     Balance Overall balance assessment: Needs assistance Sitting-balance support: Bilateral upper extremity supported, Feet supported Sitting balance-Leahy Scale: Poor Sitting balance - Comments: Slumped posture and leaning posteriorly requiring min-mod A.  WOrked on trying to maintain with UE beside, holding rail, or holding RW but still tends to lean posteriorly   Standing balance support: During functional  activity, Reliant on assistive device for balance Standing balance-Leahy Scale: Poor Standing balance comment: Reliant of mod A of 2                             Pertinent Vitals/Pain Pain Assessment Pain Assessment: No/denies pain    Home Living Family/patient expects to be discharged to:: Private residence (ILF)   Available Help at Discharge: Family;Available PRN/intermittently Type of Home: Independent living facility Home Access: Level entry       Home Layout: One level Home Equipment: Shower seat;Grab bars - tub/shower;Grab bars - toilet      Prior Function Prior Level of Function : Needs assist             Mobility Comments: Obtained from OT eval: no device to ambulate, able to ambulate outside to smoke ADLs Comments: grossly independent with ADLs - poor hygiene and getting OT for the last 4 weeks     Hand Dominance   Dominant Hand: Right    Extremity/Trunk Assessment   Upper Extremity Assessment Upper Extremity Assessment: Defer to OT evaluation    Lower Extremity Assessment Lower Extremity Assessment: LLE deficits/detail;RLE deficits/detail RLE Deficits / Details: ROM WFL; MMT 5/5 LLE Deficits / Details: ROM WFL; MMT 0/5 throughout    Cervical / Trunk Assessment Cervical / Trunk Assessment: Kyphotic  Communication   Communication: No difficulties  Cognition Arousal/Alertness: Awake/alert Behavior During Therapy: Flat affect Overall Cognitive Status: History of cognitive impairments - at baseline                                 General Comments: Dementia at baseline. Able to follow simple commands.        General Comments General comments (skin integrity, edema, etc.): Pt had small BM - assisted with cleaning.  Educated pt and family on neuroplasticity/stroke with recovery and encouraged PROM/AAROM if able L LE with pt thinking about the movements    Exercises General Exercises - Lower Extremity Ankle Circles/Pumps:  PROM, Left, 15 reps, Seated Long Arc Quad: PROM, Left, 15 reps, Seated Heel Slides: PROM, Left, 15 reps, Seated Other Exercises Other Exercises: with facilitation techniques and verbal cues   Assessment/Plan    PT Assessment Patient needs continued PT services  PT Problem List Decreased strength;Decreased range of motion;Decreased activity tolerance;Decreased knowledge of precautions;Decreased mobility;Decreased coordination;Decreased cognition;Decreased safety awareness;Decreased balance       PT Treatment Interventions DME instruction;Therapeutic exercise;Gait training;Balance training;Neuromuscular re-education;Functional mobility training;Therapeutic activities;Patient/family education;Cognitive remediation    PT Goals (Current goals can be found in the Care Plan section)  Acute Rehab PT Goals Patient Stated Goal: agreeable to rehab PT Goal Formulation: With patient/family Time For Goal Achievement: 02/06/22 Potential to Achieve Goals: Good    Frequency Min 4X/week     Co-evaluation               AM-PAC PT "6 Clicks" Mobility  Outcome Measure Help needed turning from your back to your side while in a flat bed without using bedrails?:  A Lot Help needed moving from lying on your back to sitting on the side of a flat bed without using bedrails?: A Lot Help needed moving to and from a bed to a chair (including a wheelchair)?: Total Help needed standing up from a chair using your arms (e.g., wheelchair or bedside chair)?: Total Help needed to walk in hospital room?: Total Help needed climbing 3-5 steps with a railing? : Total 6 Click Score: 8    End of Session Equipment Utilized During Treatment: Gait belt Activity Tolerance: Patient tolerated treatment well Patient left: in chair;with call bell/phone within reach;with family/visitor present (no alarms available, family present, RN aware) Nurse Communication: Mobility status;Other (comment) (no alarm available, family  visiting for ~ 1 hour, needs purewick) PT Visit Diagnosis: Other abnormalities of gait and mobility (R26.89);Muscle weakness (generalized) (M62.81)    Time: 1572-6203 PT Time Calculation (min) (ACUTE ONLY): 23 min   Charges:   PT Evaluation $PT Eval Moderate Complexity: 1 Mod PT Treatments $Therapeutic Activity: 8-22 mins        Abran Richard, PT Acute Rehab [facility] [phone]   Karlton Lemon 01/23/2022, 1:11 PM

## 2022-01-23 NOTE — Plan of Care (Signed)
  Problem: Nutrition: Goal: Adequate nutrition will be maintained Outcome: Progressing   Problem: Coping: Goal: Level of anxiety will decrease Outcome: Progressing   Problem: Elimination: Goal: Will not experience complications related to urinary retention Outcome: Progressing   Problem: Pain Managment: Goal: General experience of comfort will improve Outcome: Progressing   

## 2022-01-23 NOTE — Progress Notes (Signed)
CSW spoke to the provider as PT is recommended the patient go to a SNF. CSW has spoke to the daughter [name] who is aware of this decision. [name] the daughter wanted to know if mom/ patient would qualify for CIR in which the provider has stated that the patient will not. CSW explained the SNF process to the daughter in full. The daughter lives in [place] and would like the mom to be from [place] to [place]. CSW explained that we have no control over space and offers in facilities. The patients daughter wanted to know if she could reach out to facilities CSW advised her that she did also have that right. CSW also made sure that the daughter knew that she did not have to accept but we would need to arrange other plans If the offers were not what she wanted. TOC will continue to follow,this CSW has sent out FL2 and we will present bed offers as they come to the patients daughter.

## 2022-01-23 NOTE — Progress Notes (Addendum)
Progress Note    Gabrielle [name]   AYT:[id number]  DOB: 01/08/1931  DOA: 01/21/2022     1 PCP: Pcp, No  Initial CC: weakness, cough  Hospital Course: Gabrielle [name] is an 88 yo female with PMH COPD, CVA, anxiety, osteoporosis, macular degeneration, vascular dementia.  She presented with generalized weakness, cough, and fatigue for a few days prior to admission. She had recently been on antibiotics outpatient for a UTI and respiratory infection.  Due to ongoing weakness and persistent cough, she was brought to the ER for further evaluation. See XR was obtained which was concerning for possible left lower lobe infiltrate.  She was started on Rocephin and azithromycin.  She also had some left-sided weakness and underwent further workup with CT head and MRI brain. MRI brain was notable for scattered small acute infarcts in the right ACA territory along with underlying advanced chronic small vessel disease showing mild progression since 2017 MRI. She then underwent CT angio head/neck which was negative for LVO.  Neurology was consulted due to new stroke.  She was started on aspirin and Plavix per recommendations along with statin. PT, OT, SLP were also consulted per workup.  Interval History:  No events overnight.  Weakness is worse in her left lower extremity today compared to yesterday.  Patient otherwise had no questions when asked today. Called and updated daughter this afternoon as well.  Assessment and Plan: * CAP (community acquired pneumonia) - Has been on some treatment prior to admission - Given opacity appreciated in left lower lobe, will continue on treatment course for now and monitor clinical response -Strep pneumo urinary antigen negative.  Follow-up Legionella urinary antigen  Acute CVA (cerebrovascular accident) [facility]) - Patient presented with generalized weakness but worse on the LLE - MRI brain reveals scattered small acute infarcts involving right ACA territory - Neurology has  evaluated - Echo reviewed, no PFO.  EF 60 to 35%, grade 2 diastolic dysfunction, mild MV stenosis - Lower extremity duplex negative for DVT - TSH normal, 2.155 - A1c 5.8% - LDL 116.  Started on statin per neurology recommendations - asa and plavix for 21 days then plavix alone per neuro recs - heart monitor for 30 days recommended per neuro as well  - LLE worsening today with 0/5 strength; possible that patient may require LTC if doesn't regain much function in rehab which seems like a possibility; have discussed concerns with her daughter, Gabrielle [name], as well   Generalized weakness - see CVA  COPD, moderate ([place]) - no s/s exac - PRN duonebs   Old records reviewed in assessment of this patient  Antimicrobials: Azithro 11/23 >> current Rocephin 11/23 >> current   DVT prophylaxis:  SCDs Start: 01/21/22 2032   Code Status:   Code Status: Full Code  Mobility Assessment (last 72 hours)     Mobility Assessment     Row Name 01/23/22 1308 01/23/22 1009 01/22/22 2300 01/22/22 1556 01/22/22 0822   Does patient have an order for bedrest or is patient medically unstable -- No - Continue assessment No - Continue assessment -- No - Continue assessment   What is the highest level of mobility based on the progressive mobility assessment? Level 2 ([place]) - Balance while sitting on edge of bed and cannot stand Level 2 (Chairfast) - Balance while sitting on edge of bed and cannot stand Level 2 (Chairfast) - Balance while sitting on edge of bed and cannot stand Level 3 (Stands with assist) - Balance while standing  and cannot march in place Level 3 (Stands with assist) - Balance while standing  and cannot march in place   Is the above level different from baseline mobility prior to current illness? -- Yes - Recommend PT order Yes - Recommend PT order -- Yes - Recommend PT order    Van Name 01/21/22 2030           Does patient have an order for bedrest or is patient medically unstable No -  Continue assessment       What is the highest level of mobility based on the progressive mobility assessment? Level 4 (Walks with assist in room) - Balance while marching in place and cannot step forward and back - Complete                Barriers to discharge:  Disposition Plan:  SNF Status is: Inpt  Objective: Blood pressure (!) 132/48, pulse 65, temperature 98.7 F (37.1 C), temperature source Oral, resp. rate 16, height '5\' 4"'$  (1.626 m), weight 61.2 kg, SpO2 94 %.  Examination:  Physical Exam Constitutional:      General: She is not in acute distress.    Comments: Fatigued appearing  HENT:     Head: Normocephalic and atraumatic.     Mouth/Throat:     Mouth: Mucous membranes are moist.  Eyes:     Extraocular Movements: Extraocular movements intact.  Cardiovascular:     Rate and Rhythm: Normal rate and regular rhythm.  Pulmonary:     Effort: Pulmonary effort is normal. No respiratory distress.     Breath sounds: No wheezing.  Abdominal:     General: Bowel sounds are normal. There is no distension.     Palpations: Abdomen is soft.     Tenderness: There is no abdominal tenderness.  Musculoskeletal:        General: Normal range of motion.     Cervical back: Normal range of motion and neck supple.  Skin:    General: Skin is warm and dry.  Neurological:     Mental Status: She is alert.     Comments: Worsening LLE strength (was 3/5, now 0/5 today on exam); no paresthesias. No aphasia or dysarthria.   Psychiatric:        Mood and Affect: Mood normal.      Consultants:  Neurology  Procedures:    Data Reviewed: Results for orders placed or performed during the hospital encounter of 01/21/22 (from the past 24 hour(s))  CBC with Differential/Platelet     Status: Abnormal   Collection Time: 01/23/22  3:41 AM  Result Value Ref Range   WBC 8.9 4.0 - 10.5 K/uL   RBC 3.65 (L) 3.87 - 5.11 MIL/uL   Hemoglobin 11.6 (L) 12.0 - 15.0 g/dL   HCT 35.8 (L) 36.0 - 46.0 %   MCV  98.1 80.0 - 100.0 fL   MCH 31.8 26.0 - 34.0 pg   MCHC 32.4 30.0 - 36.0 g/dL   RDW 13.2 11.5 - 15.5 %   Platelets 319 150 - 400 K/uL   nRBC 0.0 0.0 - 0.2 %   Neutrophils Relative % 70 %   Neutro Abs 6.2 1.7 - 7.7 K/uL   Lymphocytes Relative 18 %   Lymphs Abs 1.6 0.7 - 4.0 K/uL   Monocytes Relative 9 %   Monocytes Absolute 0.8 0.1 - 1.0 K/uL   Eosinophils Relative 2 %   Eosinophils Absolute 0.2 0.0 - 0.5 K/uL   Basophils Relative 1 %  Basophils Absolute 0.1 0.0 - 0.1 K/uL   Immature Granulocytes 0 %   Abs Immature Granulocytes 0.03 0.00 - 0.07 K/uL  Basic metabolic panel     Status: Abnormal   Collection Time: 01/23/22  3:41 AM  Result Value Ref Range   Sodium 140 135 - 145 mmol/L   Potassium 3.9 3.5 - 5.1 mmol/L   Chloride 107 98 - 111 mmol/L   CO2 25 22 - 32 mmol/L   Glucose, Bld 101 (H) 70 - 99 mg/dL   BUN 27 (H) 8 - 23 mg/dL   Creatinine, Ser 0.75 0.44 - 1.00 mg/dL   Calcium 8.3 (L) 8.9 - 10.3 mg/dL   GFR, Estimated >60 >60 mL/min   Anion gap 8 5 - 15  Magnesium     Status: None   Collection Time: 01/23/22  3:41 AM  Result Value Ref Range   Magnesium 2.0 1.7 - 2.4 mg/dL    I have Reviewed nursing notes, Vitals, and Lab results since pt's last encounter. Pertinent lab results : see above I have ordered test including BMP, CBC, Mg I have reviewed the last note from staff over past 24 hours I have discussed pt's care plan and test results with nursing staff, case manager  Time spent: Greater than 50% of the 55 minute visit was spent in counseling/coordination of care for the patient as laid out in the A&P.    LOS: 1 day   Dwyane Dee, MD [facility] 01/23/2022, 3:03 PM

## 2022-01-23 NOTE — Plan of Care (Signed)
  Problem: Respiratory: Goal: Ability to maintain a clear airway will improve Outcome: Progressing   Problem: Education: Goal: Knowledge of General Education information will improve Description: Including pain rating scale, medication(s)/side effects and non-pharmacologic comfort measures Outcome: Progressing   Problem: Coping: Goal: Level of anxiety will decrease Outcome: Progressing   Problem: Pain Managment: Goal: General experience of comfort will improve Outcome: Progressing   Problem: Safety: Goal: Ability to remain free from injury will improve Outcome: Progressing   Problem: Skin Integrity: Goal: Risk for impaired skin integrity will decrease Outcome: Progressing

## 2022-01-24 DIAGNOSIS — J189 Pneumonia, unspecified organism: Secondary | ICD-10-CM | POA: Diagnosis not present

## 2022-01-24 DIAGNOSIS — R531 Weakness: Secondary | ICD-10-CM | POA: Diagnosis not present

## 2022-01-24 DIAGNOSIS — I639 Cerebral infarction, unspecified: Secondary | ICD-10-CM | POA: Diagnosis not present

## 2022-01-24 NOTE — Progress Notes (Signed)
Progress Note    Gabrielle [name]   JJK:[id number]  DOB: 16-Nov-1930  DOA: 01/21/2022     2 PCP: Pcp, No  Initial CC: weakness, cough  Hospital Course: [name] is an 88 yo female with PMH COPD, CVA, anxiety, osteoporosis, macular degeneration, vascular dementia.  She presented with generalized weakness, cough, and fatigue for a few days prior to admission. She had recently been on antibiotics outpatient for a UTI and respiratory infection.  Due to ongoing weakness and persistent cough, she was brought to the ER for further evaluation. See XR was obtained which was concerning for possible left lower lobe infiltrate.  She was started on Rocephin and azithromycin.  She also had some left-sided weakness and underwent further workup with CT head and MRI brain. MRI brain was notable for scattered small acute infarcts in the right ACA territory along with underlying advanced chronic small vessel disease showing mild progression since 2017 MRI. She then underwent CT angio head/neck which was negative for LVO.  Neurology was consulted due to new stroke.  She was started on aspirin and Plavix per recommendations along with statin. PT, OT, SLP were also consulted per workup.  Interval History:  No events overnight.  Persistent significant weakness in left lower extremity, unable to move leg at all.  Otherwise no significant changes since yesterday.  Assessment and Plan: * Acute CVA (cerebrovascular accident) [facility]) - Patient presented with generalized weakness but worse on the LLE - MRI brain reveals scattered small acute infarcts involving right ACA territory - Neurology has evaluated - Echo reviewed, no PFO.  EF 60 to 37%, grade 2 diastolic dysfunction, mild MV stenosis - Lower extremity duplex negative for DVT - TSH normal, 2.155 - A1c 5.8% - LDL 116.  Started on statin per neurology recommendations - asa and plavix for 21 days then plavix alone per neuro recs - heart monitor for 30 days  recommended per neuro as well  - LLE worsening today with 0/5 strength; possible that patient may require LTC if doesn't regain much function in rehab which seems like a possibility; have discussed concerns with her daughter, Gabrielle [name], as well   CAP (community acquired pneumonia) - Has been on some treatment prior to admission - Given opacity appreciated in left lower lobe, will continue on treatment course for now and monitor clinical response -Strep pneumo urinary antigen negative.  Follow-up Legionella urinary antigen  Generalized weakness - see CVA  COPD, moderate ([place]) - no s/s exac - PRN duonebs   Old records reviewed in assessment of this patient  Antimicrobials: Azithro 11/23 >> current Rocephin 11/23 >> current   DVT prophylaxis:  SCDs Start: 01/21/22 2032   Code Status:   Code Status: Full Code  Mobility Assessment (last 72 hours)     Mobility Assessment     Row Name 01/24/22 1696 01/23/22 2010 01/23/22 1308 01/23/22 1009 01/22/22 2300   Does patient have an order for bedrest or is patient medically unstable No - Continue assessment No - Continue assessment -- No - Continue assessment No - Continue assessment   What is the highest level of mobility based on the progressive mobility assessment? Level 2 ([place]) - Balance while sitting on edge of bed and cannot stand Level 2 (Chairfast) - Balance while sitting on edge of bed and cannot stand Level 2 (Chairfast) - Balance while sitting on edge of bed and cannot stand Level 2 (Chairfast) - Balance while sitting on edge of bed and cannot stand Level 2 (  Chairfast) - Balance while sitting on edge of bed and cannot stand   Is the above level different from baseline mobility prior to current illness? Yes - Recommend PT order Yes - Recommend PT order -- Yes - Recommend PT order Yes - Recommend PT order    [place] Name 01/22/22 1556 01/22/22 1610 01/21/22 2030       Does patient have an order for bedrest or is patient medically  unstable -- No - Continue assessment No - Continue assessment     What is the highest level of mobility based on the progressive mobility assessment? Level 3 (Stands with assist) - Balance while standing  and cannot march in place Level 3 (Stands with assist) - Balance while standing  and cannot march in place Level 4 (Walks with assist in room) - Balance while marching in place and cannot step forward and back - Complete     Is the above level different from baseline mobility prior to current illness? -- Yes - Recommend PT order --              Barriers to discharge:  Disposition Plan:  SNF Status is: Inpt  Objective: Blood pressure (!) 142/63, pulse 62, temperature 98.3 F (36.8 C), temperature source Oral, resp. rate 18, height '5\' 4"'$  (1.626 m), weight 61.2 kg, SpO2 95 %.  Examination:  Physical Exam Constitutional:      General: She is not in acute distress.    Comments: Fatigued appearing  HENT:     Head: Normocephalic and atraumatic.     Mouth/Throat:     Mouth: Mucous membranes are moist.  Eyes:     Extraocular Movements: Extraocular movements intact.  Cardiovascular:     Rate and Rhythm: Normal rate and regular rhythm.  Pulmonary:     Effort: Pulmonary effort is normal. No respiratory distress.     Breath sounds: No wheezing.  Abdominal:     General: Bowel sounds are normal. There is no distension.     Palpations: Abdomen is soft.     Tenderness: There is no abdominal tenderness.  Musculoskeletal:        General: Normal range of motion.     Cervical back: Normal range of motion and neck supple.  Skin:    General: Skin is warm and dry.  Neurological:     Mental Status: She is alert.     Comments: Worsening LLE strength (was 3/5, now remains 0/5); no paresthesias. No aphasia or dysarthria.   Psychiatric:        Mood and Affect: Mood normal.      Consultants:  Neurology  Procedures:    Data Reviewed: No results found for this or any previous visit (from  the past 24 hour(s)).   I have Reviewed nursing notes, Vitals, and Lab results since pt's last encounter. Pertinent lab results : see above I have ordered test including BMP, CBC, Mg I have reviewed the last note from staff over past 24 hours I have discussed pt's care plan and test results with nursing staff, case manager  Time spent: Greater than 50% of the 55 minute visit was spent in counseling/coordination of care for the patient as laid out in the A&P.    LOS: 2 days   Dwyane Dee, MD [facility] 01/24/2022, 4:50 PM

## 2022-01-25 DIAGNOSIS — I639 Cerebral infarction, unspecified: Secondary | ICD-10-CM | POA: Diagnosis not present

## 2022-01-25 DIAGNOSIS — J189 Pneumonia, unspecified organism: Secondary | ICD-10-CM | POA: Diagnosis not present

## 2022-01-25 LAB — LEGIONELLA PNEUMOPHILA SEROGP 1 UR AG: L. pneumophila Serogp 1 Ur Ag: NEGATIVE

## 2022-01-25 MED ORDER — ENSURE ENLIVE PO LIQD
237.0000 mL | ORAL | Status: DC
  Administered 2022-01-26 – 2022-01-28 (×3): 237 mL via ORAL

## 2022-01-25 NOTE — Care Management Important Message (Signed)
Important Message  Patient Details IM Letter placed in Patient's room. Name: Gabrielle [name] MRN: [id number] Date of Birth: 1930-04-19   Medicare Important Message Given:  Yes     Kerin Salen 01/25/2022, 12:20 PM

## 2022-01-25 NOTE — Progress Notes (Signed)
Initial Nutrition Assessment  DOCUMENTATION CODES:   Non-severe (moderate) malnutrition in context of chronic illness  INTERVENTION:  - Liberalize diet to Regular to promote intake. - Ensure Enlive po daily, provides 350 kcal and 20 grams of protein. - Encourage intake as tolerated. - Continue Vitamin B12 supplementation as medically appropriate. - Monitor weights.    NUTRITION DIAGNOSIS:   Moderate Malnutrition related to chronic illness (COPD) as evidenced by mild fat depletion, moderate muscle depletion.  GOAL:   Patient will meet greater than or equal to 90% of their needs  MONITOR:   PO intake, Supplement acceptance, Weight trends  REASON FOR ASSESSMENT:   Malnutrition Screening Tool    ASSESSMENT:   88 y.o. female with medical history significant of COPD, tobacco abuse, vascular dementia, breast CA who presented with generalized weakness.  Met with patient and daughter at bedside this afternoon. Daughter reports patient had poor nutrition status over a year ago when living at home but since she has been living at assistive living facility she has been able to eat better and gain weight back. Per EMR, patient weighed at 110# in December 2022 and has gained over the past year to current weight of 135#.  Patient reports she would eat well at the outside facility if she liked the food that day. If she didn't she wouldn't eat as much. Typically only had lunch and dinner as daughter notes patient would often sleep in and miss breakfast.  Patient reports she doesn't really like the food available in hospital as it is often cold. Daughter reports the tray is often left away from the bedside so by the time she eats it it has been sitting awhile. Changed for staff to open containers on food trays in food ordering system [employer]. Only 1 meal documented since admission - 100% of lunch on 11/25.  Discussed importance of eating well to prevent weight loss during admission. Patient  agreeable to try 1 Ensure a day to support intake.   Medications reviewed and include: Vitamin B12  Labs reviewed:  -   NUTRITION - FOCUSED PHYSICAL EXAM:  Flowsheet Row Most Recent Value  Orbital Region Mild depletion  Upper Arm Region No depletion  Thoracic and Lumbar Region No depletion  Buccal Region Mild depletion  Temple Region Moderate depletion  Clavicle Bone Region Moderate depletion  Clavicle and Acromion Bone Region Moderate depletion  Scapular Bone Region Unable to assess  Dorsal Hand Mild depletion  Patellar Region Moderate depletion  Anterior Thigh Region Moderate depletion  Posterior Calf Region Moderate depletion  Edema (RD Assessment) None  Hair Reviewed  Eyes Reviewed  Mouth Reviewed  Skin Reviewed  Nails Reviewed       Diet Order:   Diet Order             Diet Heart Room service appropriate? Yes; Fluid consistency: Thin  Diet effective now                   EDUCATION NEEDS:  No education needs have been identified at this time  Skin:  Skin Assessment: Skin Integrity Issues: Skin Integrity Issues:: Stage I Stage I: Coccyx  Last BM:  11/25  Height:  Ht Readings from Last 1 Encounters:  01/21/22 _0  (1.626 m)   Weight:  Wt Readings from Last 1 Encounters:  01/21/22 61.2 kg    BMI:  Body mass index is 23.17 kg/m.  Estimated Nutritional Needs:  Kcal:  1700-1850 kcals Protein:  80-90 grams Fluid:  >/=  1.7L    [place], LDN For contact information, refer to [facility].

## 2022-01-25 NOTE — Progress Notes (Signed)
Occupational Therapy Treatment Patient Details Name: Gabrielle [name] MRN: [id number] DOB: [birth year] Today's Date: 01/25/2022   History of present illness Gabrielle [name] is an 88 yo female who presented 01/21/22 with weakness, cough and fatigue.  Pt also with L sided weakness.  Pt admitted for CAP and found to have small scattered acute infarcts in R ACA territory.  Pt with PMH COPD, CVA, anxiety, osteoporosis, macular degeneration, vascular dementia.   OT comments  Patient participated in ADL tasks sitting in recliner with increased time and patient noted to be distracted by phone towards end of session. Patient was able to complete bilateral coordination tasks with MI cutting food this AM and donning toothpaste on toothbrush. Patient would continue to benefit from skilled OT services at this time while admitted and after d/c to address noted deficits in order to improve overall safety and independence in ADLs.     Recommendations for follow up therapy are one component of a multi-disciplinary discharge planning process, led by the attending physician.  Recommendations may be updated based on patient status, additional functional criteria and insurance authorization.    Follow Up Recommendations  Skilled nursing-short term rehab (<3 hours/day)     Assistance Recommended at Discharge Frequent or constant Supervision/Assistance  Patient can return home with the following  A lot of help with walking and/or transfers;A lot of help with bathing/dressing/bathroom;Assistance with cooking/housework;Direct supervision/assist for financial management;Assist for transportation;Help with stairs or ramp for entrance;Direct supervision/assist for medications management   Equipment Recommendations  None recommended by OT    Recommendations for Other Services      Precautions / Restrictions Precautions Precautions: Fall Precaution Comments: L Hemiparesis LE flaccid 0/5 and UE 4/5  strength Restrictions Weight Bearing Restrictions: No       Mobility Bed Mobility                    Transfers                         Balance                                           ADL either performed or assessed with clinical judgement   ADL Overall ADL's : Needs assistance/impaired     Grooming: Wash/dry face;Wash/dry hands;Oral care;Set up;Sitting Grooming Details (indicate cue type and reason): in recliner Upper Body Bathing: Set up;Sitting Upper Body Bathing Details (indicate cue type and reason): in recliner     Upper Body Dressing : Minimal assistance;Sitting Upper Body Dressing Details (indicate cue type and reason): in recliner                        Extremity/Trunk Assessment              Vision       Perception     Praxis      Cognition Arousal/Alertness: Awake/alert Behavior During Therapy: Flat affect Overall Cognitive Status: Difficult to assess                                 General Comments: patient was noted to be easily distracted by phone during session.        Exercises      Shoulder Instructions  General Comments      Pertinent Vitals/ Pain       Pain Assessment Pain Assessment: No/denies pain  Home Living     Available Help at Discharge: Family;Available PRN/intermittently Type of Home: Independent living facility                              Lives With: Other (Comment) (brother)    Prior Functioning/Environment              Frequency  Min 2X/week        Progress Toward Goals  OT Goals(current goals can now be found in the care plan section)  Progress towards OT goals: Progressing toward goals     Plan Discharge plan remains appropriate    Co-evaluation                 AM-PAC OT "6 Clicks" Daily Activity     Outcome Measure   Help from another person eating meals?: A Little Help from another person  taking care of personal grooming?: A Little Help from another person toileting, which includes using toliet, bedpan, or urinal?: Total Help from another person bathing (including washing, rinsing, drying)?: A Lot Help from another person to put on and taking off regular upper body clothing?: A Lot Help from another person to put on and taking off regular lower body clothing?: A Lot 6 Click Score: 13    End of Session    OT Visit Diagnosis: Hemiplegia and hemiparesis Hemiplegia - Right/Left: Left Hemiplegia - dominant/non-dominant: Non-Dominant Hemiplegia - caused by: Cerebral infarction   Activity Tolerance Patient tolerated treatment well   Patient Left in chair;with call bell/phone within reach;with chair alarm set;with family/visitor present   Nurse Communication Other (comment) (patient reporting food was cold and daughter asking about solutions)        Time: 1419-1450 OT Time Calculation (min): 31 min  Charges: OT General Charges $OT Visit: 1 Visit OT Treatments $Self Care/Home Management : 23-37 mins  Rennie Plowman, MS Acute Rehabilitation Department Office# [phone]   Willa Rough 01/25/2022, 3:51 PM

## 2022-01-25 NOTE — Evaluation (Signed)
Speech Language Pathology Evaluation Patient Details Name: Gabrielle [name] MRN: [id number] DOB: 06/27/1930 Today's Date: 01/25/2022 Time: 1355-1420 SLP Time Calculation (min) (ACUTE ONLY): 25 min  Problem List:  Patient Active Problem List   Diagnosis Date Noted   Pressure injury of skin 01/22/2022   Acute CVA (cerebrovascular accident) ([place]) 01/22/2022   CAP (community acquired pneumonia) 01/21/2022   History of breast cancer 01/21/2022   Moderate protein-calorie malnutrition ([place]) 09/25/2020   BMI less than 19,adult 09/25/2020   Fall 09/25/2020   Generalized weakness 09/25/2020   Senile purpura ([place]) 09/25/2020   Statin declined 03/14/2020   Absence of bladder continence 03/11/2020   History of skin cancer 03/11/2020   PAD (peripheral artery disease) ([place]) 11/26/2018   Femoral artery occlusion, right ([place]) 11/26/2018   Vascular dementia ([place]) 08/01/2015   Osteoporosis 12/20/2014   COPD, moderate ([place]) 12/07/2013   Cachexia ([place]) 12/07/2013   Malignant neoplasm of upper-outer quadrant of right breast in female, estrogen receptor positive ([place]) 11/29/2013   Weight loss 10/05/2013   Cigarette smoker 10/05/2013   Past Medical History:  Past Medical History:  Diagnosis Date   Anxiety    Breast cancer ([place]) 2015   right   Breast cancer of upper-outer quadrant of right female breast ([place]) 10/2013   Anastrazole since 12/2013--plan is to take this for 5 yrs.  No evidence of dz recurrence as of 11/2015 oncol f/u.   Cataract    COPD (chronic obstructive pulmonary disease) ([facility])    Lung nodule, solitary 11/21/2013   6 mm lung nodule R lung seen on chest CT.  R breast mass. Disgnostic MMG pending.    Macular degeneration    Osteoporosis 10/2014   T-score -2.8   Pneumonia    hx of    Retina hole    Skin cancer    Stroke ([place])    Tobacco dependence    Past Surgical History:  Past Surgical History:  Procedure Laterality Date   ABDOMINAL HYSTERECTOMY  1980   (ovaries intact)    [place]   Patient reports approximately 30 years ago having "bladder surgery "in which she describes a procedure that either stretched her bladder or her urethra dilated secondary to her not being able to void.   BREAST BIOPSY Right    CATARACT EXTRACTION Bilateral    MASTECTOMY Right 2015   MOHS SURGERY     Dr. Sarajane Jews   PARS PLANA VITRECTOMY W/ REPAIR OF MACULAR HOLE Right    RETINAL LASER PROCEDURE     TOTAL MASTECTOMY Right 12/18/2013   Procedure: RIGHT TOTAL MASTECTOMY;  Surgeon: Excell Seltzer, MD;  Location: [facility] ORS;  Service: General;  Laterality: Right;   HPI:  Patient is an 88 y.o. female with PMH: COPD, CVA, anxiety, osteoporosis, macular degeneration, vascular dementia. She has been living at an ILF [facility]) for the past year, living in apartment with her brother who is 35 years older. She presented to the hospital on 01/21/22 with weakness, cough and fatigue and admitted for CAP. MRI brain showed scattered small acute infarcts in the right ACA territory but no associated hemorrhage or mass effect.   Assessment / Plan / Recommendation Clinical Impression  Patient presents with a mild cognitive impairment as per this evaluation, however difficult to differentiate between her baseline as she does have documented memory impairment with vascular dementia diagnosis per chart review. During today's evaluation, patient was fully oriented to time and place but did benefit  from verbal cues to elaborate/fully describe when asked reason she is currently admitted in the hospital. She acknowledges left sided weakness with her left leg being primary issue preventing her ambulation. She did scan to the left and attend to left during task of reading from Stroke information book without need for her glasses (she has macular degeneration) without cues needed and during clock drawing task, patient did not exhibit any errors or need for cues to attend to left  visual field. She was often slow to respond, requiring extra time when answering more detailed questions. Affect was somewhat flat but patient was fully engaged and seemed to enjoy talking to SLP. She would become distracted by TV that was on and became somewhat fixated on looking through photos on phone after showing SLP a photo of a trip she had taken. She responded well to verbal cues to redirect attention. SLP is recommending skilled intervention at next venue of care (SNF vs HH at her ILF).    SLP Assessment  SLP Recommendation/Assessment: All further Speech Lanaguage Pathology  needs can be addressed in the next venue of care SLP Visit Diagnosis: Cognitive communication deficit (R41.841)    Recommendations for follow up therapy are one component of a multi-disciplinary discharge planning process, led by the attending physician.  Recommendations may be updated based on patient status, additional functional criteria and insurance authorization.    Follow Up Recommendations  Other (comment) (SLP evaluate and treatment at next venue of care)    Assistance Recommended at Discharge  Intermittent Supervision/Assistance  Functional Status Assessment Patient has had a recent decline in their functional status and demonstrates the ability to make significant improvements in function in a reasonable and predictable amount of time.  Frequency and Duration           SLP Evaluation Cognition  Overall Cognitive Status: Difficult to assess Arousal/Alertness: Awake/alert Orientation Level: Oriented X4 Year: 2023 Month: November Day of Week: Correct Attention: Sustained Sustained Attention: Appears intact Memory: Impaired Memory Impairment: Decreased recall of new information Awareness: Impaired Awareness Impairment: Emergent impairment Problem Solving: Appears intact Safety/Judgment: Impaired Comments: patient does not appear with complete awareness to her current deficits and their impact on  her function but she does not appear impulsive       Comprehension  Auditory Comprehension Overall Auditory Comprehension: Appears within functional limits for tasks assessed    Expression Expression Primary Mode of Expression: Verbal Verbal Expression Overall Verbal Expression: Appears within functional limits for tasks assessed   Oral / Motor  Oral Motor/Sensory Function Overall Oral Motor/Sensory Function: Within functional limits Motor Speech Overall Motor Speech: Appears within functional limits for tasks assessed Respiration: Within functional limits Resonance: Within functional limits Articulation: Within functional limitis Intelligibility: Intelligible Motor Planning: Witnin functional limits            Sonia Baller, MA, CCC-SLP Speech Therapy

## 2022-01-25 NOTE — Progress Notes (Signed)
Physical Therapy Treatment Patient Details Name: Gabrielle [name] MRN: [id number] DOB: [birth year] Today's Date: 01/25/2022   History of Present Illness Gabrielle [name] is an 88 yo female who presented 01/21/22 with weakness, cough and fatigue.  Pt also with L sided weakness.  Pt admitted for CAP and found to have small scattered acute infarcts in R ACA territory.  Pt with PMH COPD, CVA, anxiety, osteoporosis, macular degeneration, vascular dementia.    PT Comments    General Comments: Hx Dementia but following all commands, pleasant, not fully aware of her current medical status. Assisted OOB to recliner was difficult and required + 2 assist. General bed mobility comments: Required assist for L LE and to lift trunk; increased time with cues to scoot forward.  Increased assist with scooting using bed pad.  Present with poor kyphotic siiting EOB required Min Assist. General transfer comment: used STEDY to perfrom sit to stand due to 0/5 L LE strength/knee buckle.  Required + 2 Max Asisst to achieve partial upright posture.  Again, poor kyphotic posture/head downwardpostioin.  Tolerated sit to stand twice with second time, pt was able to static stand x 55 seconds in STEDY without use of flaps.  Thrn positioned flaps down to "roll" her to recliner.  stand to sit, pt required + 2 assist to control sit.  Pt able to place L hand on STEDY bar but showed trace active use unless cued to do so.  Present with left neglect.  Assisted to recliner and positioned to comfort.  MMT LE remains 0/5.  Pt will need ST Rehab at SNF to address mobility and functional decline prior to safely returning home.    Recommendations for follow up therapy are one component of a multi-disciplinary discharge planning process, led by the attending physician.  Recommendations may be updated based on patient status, additional functional criteria and insurance authorization.  Follow Up Recommendations  Skilled nursing-short term rehab (<3  hours/day) Can patient physically be transported by private vehicle: No   Assistance Recommended at Discharge Frequent or constant Supervision/Assistance  Patient can return home with the following Two people to help with walking and/or transfers;Two people to help with bathing/dressing/bathroom   Equipment Recommendations  None recommended by PT    Recommendations for Other Services       Precautions / Restrictions Precautions Precautions: Fall Precaution Comments: L Hemiparesis LE flaccid 0/5 and UE 4/5 strength Restrictions Weight Bearing Restrictions: No     Mobility  Bed Mobility Overal bed mobility: Needs Assistance Bed Mobility: Supine to Sit     Supine to sit: Max assist     General bed mobility comments: Required assist for L LE and to lift trunk; increased time with cues to scoot forward.  Increased assist with scooting using bed pad.  Present with poor kyphotic siiting EOB required Min Assist.    Transfers Overall transfer level: Needs assistance Equipment used: 2 person hand held assist Transfers: Sit to/from Stand             General transfer comment: used STEDY to perfrom sit to stand due to 0/5 L LE strength/knee buckle.  Required + 2 Max Asisst to achieve partial upright posture.  Again, poor kyphotic posture/head downwardpostioin.  Tolerated sit to stand twice with second time, pt was able to static stand x 55 seconds in STEDY without use of flaps.  Thrn positioned flaps down to "roll" her to recliner.  stand to sit, pt required + 2 assist to control sit.  Pt  able to place L hand on STEDY bar but showed trace active use unless cued to do so.  Present with left neglect. Transfer via Lift Equipment: Stedy  Ambulation/Gait               General Gait Details: currently Non amb due to left hemiparesis LE>UE involvement.   Stairs             Wheelchair Mobility    Modified Rankin (Stroke Patients Only)       Balance                                             Cognition Arousal/Alertness: Awake/alert Behavior During Therapy: Flat affect Overall Cognitive Status: Difficult to assess                                 General Comments: Hx Dementia but following all commands, pleasant, not fully aware of her current medical status.        Exercises      General Comments        Pertinent Vitals/Pain Pain Assessment Pain Assessment: Faces Faces Pain Scale: Hurts little more Pain Location: L hand "achy" Pain Descriptors / Indicators: Aching Pain Intervention(s): Monitored during session, Repositioned    Home Living                          Prior Function            PT Goals (current goals can now be found in the care plan section) Progress towards PT goals: Progressing toward goals    Frequency    Min 4X/week      PT Plan Current plan remains appropriate    Co-evaluation              AM-PAC PT "6 Clicks" Mobility   Outcome Measure  Help needed turning from your back to your side while in a flat bed without using bedrails?: Total Help needed moving from lying on your back to sitting on the side of a flat bed without using bedrails?: Total Help needed moving to and from a bed to a chair (including a wheelchair)?: Total Help needed standing up from a chair using your arms (e.g., wheelchair or bedside chair)?: Total Help needed to walk in hospital room?: Total Help needed climbing 3-5 steps with a railing? : Total 6 Click Score: 6    End of Session Equipment Utilized During Treatment: Gait belt Activity Tolerance: Patient tolerated treatment well Patient left: in chair;with call bell/phone within reach;with family/visitor present Nurse Communication: Mobility status;Need for lift equipment PT Visit Diagnosis: Other abnormalities of gait and mobility (R26.89);Muscle weakness (generalized) (M62.81)     Time: 8003-4917 PT Time Calculation (min)  (ACUTE ONLY): 28 min  Charges:  $Therapeutic Activity: 23-37 mins                     Rica Koyanagi  PTA Acute  Rehabilitation Services Office M-F          [phone] Weekend pager [phone]

## 2022-01-25 NOTE — Progress Notes (Signed)
Progress Note    Gabrielle [name]   HDQ:[id number]  DOB: 12-18-1930  DOA: 01/21/2022     3 PCP: Gabrielle [name]  Initial CC: weakness, cough  Hospital Course: Gabrielle [name] is an 88 yo female with PMH COPD, CVA, anxiety, osteoporosis, macular degeneration, vascular dementia.  She presented with generalized weakness, cough, and fatigue for a few days prior to admission. She had recently been on antibiotics outpatient for a UTI and respiratory infection.  Due to ongoing weakness and persistent cough, she was brought to the ER for further evaluation. See XR was obtained which was concerning for possible left lower lobe infiltrate.  She was started on Rocephin and azithromycin.  She also had some left-sided weakness and underwent further workup with CT head and MRI brain. MRI brain was notable for scattered small acute infarcts in the right ACA territory along with underlying advanced chronic small vessel disease showing mild progression since 2017 MRI. She then underwent CT angio head/neck which was negative for LVO.  Neurology was consulted due to new stroke.  She was started on aspirin and Plavix per recommendations along with statin. PT, OT, SLP were also consulted per workup.  Interval History:  [name] events overnight.  Continues to remain weak with left lower extremity.  Daughter also present bedside when seen again this afternoon.  We discussed plans for pursuing rehab at discharge.  Assessment and Plan: * Acute CVA (cerebrovascular accident) [facility]) - Patient presented with generalized weakness but worse on the LLE - MRI brain reveals scattered small acute infarcts involving right ACA territory - Neurology has evaluated - Echo reviewed, [name] PFO.  EF 60 to 11%, grade 2 diastolic dysfunction, mild MV stenosis - Lower extremity duplex negative for DVT - TSH normal, 2.155 - A1c 5.8% - LDL 116.  Started on statin per neurology recommendations - asa and plavix for 21 days then plavix alone per neuro recs -  heart monitor for 30 days recommended per neuro as well  - LLE worsening today with 0/5 strength; possible that patient may require LTC if doesn't regain much function in rehab which seems like a possibility; have discussed concerns with her daughter, Gabrielle [name], as well   CAP (community acquired pneumonia) - Has been on some treatment prior to admission - Given opacity appreciated in left lower lobe, will continue on treatment course for now and monitor clinical response -Strep pneumo urinary antigen negative.  Follow-up Legionella urinary antigen  Generalized weakness - see CVA  COPD, moderate (HCC) - [name] s/s exac - PRN duonebs   Old records reviewed in assessment of this patient  Antimicrobials: Azithro 11/23 >> 11/27 Rocephin 11/23 >> 11/27  DVT prophylaxis:  SCDs Start: 01/21/22 2032   Code Status:   Code Status: Full Code  Mobility Assessment (last 72 hours)     Mobility Assessment     Row Name 01/25/22 1322 01/24/22 0907 01/23/22 2010 01/23/22 1308 01/23/22 1009   Does patient have an order for bedrest or is patient medically unstable -- [name] - Continue assessment [name] - Continue assessment -- [name] - Continue assessment   What is the highest level of mobility based on the progressive mobility assessment? Level 3 (Stands with assist) - Balance while standing  and cannot march in place Level 2 (Chairfast) - Balance while sitting on edge of bed and cannot stand Level 2 (Chairfast) - Balance while sitting on edge of bed and cannot stand Level 2 (Chairfast) - Balance while sitting on edge of bed and  cannot stand Level 2 (Chairfast) - Balance while sitting on edge of bed and cannot stand   Is the above level different from baseline mobility prior to current illness? -- Yes - Recommend PT order Yes - Recommend PT order -- Yes - Recommend PT order    Row Name 01/22/22 2300 01/22/22 1556         Does patient have an order for bedrest or is patient medically unstable [name] - Continue  assessment --      What is the highest level of mobility based on the progressive mobility assessment? Level 2 (Chairfast) - Balance while sitting on edge of bed and cannot stand Level 3 (Stands with assist) - Balance while standing  and cannot march in place      Is the above level different from baseline mobility prior to current illness? Yes - Recommend PT order --               Barriers to discharge:  Disposition Plan:  SNF Status is: Inpt  Objective: Blood pressure 134/69, pulse 67, temperature 99.1 F (37.3 C), temperature source Oral, resp. rate 16, height '5\' 4"'$  (1.626 m), weight 61.2 kg, SpO2 94 %.  Examination:  Physical Exam Constitutional:      General: She is not in acute distress.    Comments: Fatigued appearing  HENT:     Head: Normocephalic and atraumatic.     Mouth/Throat:     Mouth: Mucous membranes are moist.  Eyes:     Extraocular Movements: Extraocular movements intact.  Cardiovascular:     Rate and Rhythm: Normal rate and regular rhythm.  Pulmonary:     Effort: Pulmonary effort is normal. [name] respiratory distress.     Breath sounds: [name] wheezing.  Abdominal:     General: Bowel sounds are normal. There is [name] distension.     Palpations: Abdomen is soft.     Tenderness: There is [name] abdominal tenderness.  Musculoskeletal:        General: Normal range of motion.     Cervical back: Normal range of motion and neck supple.  Skin:    General: Skin is warm and dry.  Neurological:     Mental Status: She is alert.     Comments: Worsening LLE strength (was 3/5, now remains 0/5); [name] paresthesias. [name] aphasia or dysarthria.   Psychiatric:        Mood and Affect: Mood normal.      Consultants:  Neurology  Procedures:    Data Reviewed: [name] results found for this or any previous visit (from the past 24 hour(s)).   I have Reviewed nursing notes, Vitals, and Lab results since pt's last encounter. Pertinent lab results : see above I have reviewed the last note  from staff over past 24 hours I have discussed pt's care plan and test results with nursing staff, case manager    LOS: 3 days   Dwyane Dee, MD [facility] 01/25/2022, 3:31 PM

## 2022-01-26 ENCOUNTER — Telehealth: Payer: Self-pay | Admitting: Neurology

## 2022-01-26 DIAGNOSIS — J189 Pneumonia, unspecified organism: Secondary | ICD-10-CM | POA: Diagnosis not present

## 2022-01-26 DIAGNOSIS — I639 Cerebral infarction, unspecified: Secondary | ICD-10-CM | POA: Diagnosis not present

## 2022-01-26 LAB — CULTURE, BLOOD (ROUTINE X 2)
Culture: NO GROWTH
Culture: NO GROWTH
Special Requests: ADEQUATE
Special Requests: ADEQUATE

## 2022-01-26 NOTE — Telephone Encounter (Signed)
Pt's daughter called in wanting some advice. The pt had a stroke around thanksgiving and is currently still in the hospital. She is wondering about steps going forward from this.

## 2022-01-26 NOTE — Progress Notes (Signed)
Progress Note    Gabrielle [name]   NWG:[id number]  DOB: 1930/09/03  DOA: 01/21/2022     4 PCP: Pcp, No  Initial CC: weakness, cough  Hospital Course: Gabrielle [name] is an 88 yo female with PMH COPD, CVA, anxiety, osteoporosis, macular degeneration, vascular dementia.  She presented with generalized weakness, cough, and fatigue for a few days prior to admission. She had recently been on antibiotics outpatient for a UTI and respiratory infection.  Due to ongoing weakness and persistent cough, she was brought to the ER for further evaluation. See XR was obtained which was concerning for possible left lower lobe infiltrate.  She was started on Rocephin and azithromycin.  She also had some left-sided weakness and underwent further workup with CT head and MRI brain. MRI brain was notable for scattered small acute infarcts in the right ACA territory along with underlying advanced chronic small vessel disease showing mild progression since 2017 MRI. She then underwent CT angio head/neck which was negative for LVO.  Neurology was consulted due to new stroke.  She was started on aspirin and Plavix per recommendations along with statin. PT, OT, SLP were also consulted per workup.  Interval History:  No events overnight.  Ongoing left lower extremity dense weakness persists.  Flat affect and otherwise appears the same as she has been in the past few days.  Assessment and Plan: * Acute CVA (cerebrovascular accident) [facility]) - Patient presented with generalized weakness but worse on the LLE - MRI brain reveals scattered small acute infarcts involving right ACA territory - Neurology has evaluated - Echo reviewed, no PFO.  EF 60 to 57%, grade 2 diastolic dysfunction, mild MV stenosis - Lower extremity duplex negative for DVT - TSH normal, 2.155 - A1c 5.8% - LDL 116.  Started on statin per neurology recommendations - asa and plavix for 21 days then plavix alone per neuro recs - heart monitor for 30 days  recommended per neuro as well  - LLE worsening today with 0/5 strength; possible that patient may require LTC if doesn't regain much function in rehab which seems like a possibility; have discussed concerns with her daughter, [name], as well   CAP (community acquired pneumonia)-resolved as of 01/26/2022 - Has been on some treatment prior to admission - Given opacity appreciated in left lower lobe, will continue on treatment course for now and monitor clinical response -Strep pneumo and Legionella urinary antigen negative -Antibiotic course completed in hospital  Generalized weakness - see CVA  COPD, moderate ([place]) - no s/s exac - PRN duonebs   Old records reviewed in assessment of this patient  Antimicrobials: Azithro 11/23 >> 11/27 Rocephin 11/23 >> 11/27  DVT prophylaxis:  SCDs Start: 01/21/22 2032   Code Status:   Code Status: Full Code  Mobility Assessment (last 72 hours)     Mobility Assessment     Row Name 01/26/22 0900 01/25/22 1549 01/25/22 1322 01/24/22 0907 01/23/22 2010   Does patient have an order for bedrest or is patient medically unstable No - Continue assessment -- -- No - Continue assessment No - Continue assessment   What is the highest level of mobility based on the progressive mobility assessment? Level 1 (Bedfast) - Unable to balance while sitting on edge of bed Level 3 (Stands with assist) - Balance while standing  and cannot march in place Level 3 (Stands with assist) - Balance while standing  and cannot march in place Level 2 (Chairfast) - Balance while sitting on edge of  bed and cannot stand Level 2 (Chairfast) - Balance while sitting on edge of bed and cannot stand   Is the above level different from baseline mobility prior to current illness? -- -- -- Yes - Recommend PT order Yes - Recommend PT order            Barriers to discharge:  Disposition Plan:  SNF Status is: Inpt  Objective: Blood pressure (!) 107/42, pulse 69, temperature 99.4 F  (37.4 C), temperature source Oral, resp. rate 18, height '5\' 4"'$  (1.626 m), weight 61.2 kg, SpO2 94 %.  Examination:  Physical Exam Constitutional:      General: She is not in acute distress.    Comments: Fatigued appearing  HENT:     Head: Normocephalic and atraumatic.     Mouth/Throat:     Mouth: Mucous membranes are moist.  Eyes:     Extraocular Movements: Extraocular movements intact.  Cardiovascular:     Rate and Rhythm: Normal rate and regular rhythm.  Pulmonary:     Effort: Pulmonary effort is normal. No respiratory distress.     Breath sounds: No wheezing.  Abdominal:     General: Bowel sounds are normal. There is no distension.     Palpations: Abdomen is soft.     Tenderness: There is no abdominal tenderness.  Musculoskeletal:        General: Normal range of motion.     Cervical back: Normal range of motion and neck supple.  Skin:    General: Skin is warm and dry.  Neurological:     Mental Status: She is alert.     Comments: Worsening LLE strength (was 3/5, now remains 0/5); no paresthesias. No aphasia or dysarthria.   Psychiatric:        Mood and Affect: Mood normal.      Consultants:  Neurology  Procedures:    Data Reviewed: No results found for this or any previous visit (from the past 24 hour(s)).   I have Reviewed nursing notes, Vitals, and Lab results since pt's last encounter. Pertinent lab results : see above I have reviewed the last note from staff over past 24 hours I have discussed pt's care plan and test results with nursing staff, case manager    LOS: 4 days   Dwyane Dee, MD [facility] 01/26/2022, 2:30 PM

## 2022-01-26 NOTE — Telephone Encounter (Signed)
Called and spoke to patients daughter and informed her that she would need to talk to  mother's in-patient team to talk to the in-patient team about her care plan. Patients daughter verbalized understanding and had no further questions or concerns.

## 2022-01-26 NOTE — Telephone Encounter (Addendum)
She would need to talk to mother's in-patient team to talk to the in-patient team about her care plan.

## 2022-01-26 NOTE — Progress Notes (Signed)
Occupational Therapy Treatment Patient Details Name: Gabrielle [name] MRN: [id number] DOB: January 09, 1931 Today's Date: 01/26/2022   History of present illness Gabrielle [name] is an 88 yo female who presented 01/21/22 with weakness, cough and fatigue.  Pt also with L sided weakness.  Pt admitted for CAP and found to have small scattered acute infarcts in R ACA territory.  Pt with PMH COPD, CVA, anxiety, osteoporosis, macular degeneration, vascular dementia.   OT comments  Patient required max assist for supine to sit & scooting to EOB, with increased cues for sequencing required. She presented with impaired sitting balance, given frequent R sided and posterior leaning; sitting balance re-training was provided for which she required mod assist and increased cues for BUE placement on bed, trunk extension, and neck extension. She performed 2 sit to stand transfers using the Denna Haggard, for which she needed mod-max assist x2 before being assisted into sitting in the bedside chair. OT educated the pt's daughter on the importance of having the pt use her affected LUE to attempt simple functional tasks (e.g., grasping a cup), to facilitate improved overall function. Continue OT plan of care.   Recommendations for follow up therapy are one component of a multi-disciplinary discharge planning process, led by the attending physician.  Recommendations may be updated based on patient status, additional functional criteria and insurance authorization.    Follow Up Recommendations  Skilled nursing-short term rehab (<3 hours/day)     Assistance Recommended at Discharge Frequent or constant Supervision/Assistance  Patient can return home with the following  A lot of help with walking and/or transfers;A lot of help with bathing/dressing/bathroom;Assistance with cooking/housework;Direct supervision/assist for financial management;Assist for transportation;Help with stairs or ramp for entrance;Direct supervision/assist for  medications management   Equipment Recommendations  Other (comment) (to be determined pending progress at next setting)       Precautions / Restrictions Precautions Precautions: Fall Precaution Comments: L Hemiparesis with weakness more pronounced in the LLE than the LUE Restrictions Weight Bearing Restrictions: No       Mobility Bed Mobility Overal bed mobility: Needs Assistance Bed Mobility: Supine to Sit     Supine to sit: Max assist, HOB elevated     General bed mobility comments: Required assist for L LE and to lift trunk; increased time with cues to scoot forward.  Increased assist with scooting using bed pad.  Present with kyphotic siiting EOB,    Transfers Overall transfer level: Needs assistance   Transfers: Sit to/from Stand Sit to Stand: Mod assist, +2 physical assistance           General transfer comment: Used STEDY to perfrom sit to stand transfers from EOB x2. She required instruction on trunk shifting forward, B UE placement on support bar, pushing with LE's, and neck extension Transfer via Lift Equipment: Stedy   ADL either performed or assessed with clinical judgement   ADL Overall ADL's : Needs assistance/impaired Eating/Feeding: Sitting;Set up Eating/Feeding Details (indicate cue type and reason): based on clinical judgement Grooming: Set up;Minimal assistance Grooming Details (indicate cue type and reason): in recliner         Upper Body Dressing : Moderate assistance Upper Body Dressing Details (indicate cue type and reason): in recliner Lower Body Dressing: Total assistance Lower Body Dressing Details (indicate cue type and reason): sitting EOB     Toileting- Clothing Manipulation and Hygiene: Total assistance Toileting - Clothing Manipulation Details (indicate cue type and reason): assist required for posterior peri-hygiene in standing EOB; Denna Haggard used  to assist pt standing balance              Cognition Arousal/Alertness:  Awake/alert Behavior During Therapy: Flat affect Overall Cognitive Status: Difficult to assess                      Pertinent Vitals/ Pain       Pain Assessment Pain Assessment: No/denies pain         Frequency  Min 2X/week        Progress Toward Goals  OT Goals(current goals can now be found in the care plan section)  Progress towards OT goals: Progressing toward goals  Acute Rehab OT Goals Patient Stated Goal: she did not specifically state OT Goal Formulation: With patient Time For Goal Achievement: 02/05/22 Potential to Achieve Goals: [place] Discharge plan remains appropriate       AM-PAC OT "6 Clicks" Daily Activity     Outcome Measure   Help from another person eating meals?: A Little Help from another person taking care of personal grooming?: A Little Help from another person toileting, which includes using toliet, bedpan, or urinal?: Total Help from another person bathing (including washing, rinsing, drying)?: A Lot Help from another person to put on and taking off regular upper body clothing?: A Lot Help from another person to put on and taking off regular lower body clothing?: Total 6 Click Score: 12    End of Session Equipment Utilized During Treatment: Gait belt  OT Visit Diagnosis: Hemiplegia and hemiparesis Hemiplegia - Right/Left: Left Hemiplegia - dominant/non-dominant: Non-Dominant Hemiplegia - caused by: Cerebral infarction   Activity Tolerance Patient tolerated treatment well   Patient Left in chair;with call bell/phone within reach;with family/visitor present   Nurse Communication Need for lift equipment;Mobility status        Time: 1440-1510 OT Time Calculation (min): 30 min  Charges: OT General Charges $OT Visit: 1 Visit OT Treatments $Therapeutic Activity: 23-37 mins   Leota Sauers, OTR/L 01/26/2022, 3:23 PM

## 2022-01-27 DIAGNOSIS — I639 Cerebral infarction, unspecified: Secondary | ICD-10-CM | POA: Diagnosis not present

## 2022-01-27 DIAGNOSIS — J449 Chronic obstructive pulmonary disease, unspecified: Secondary | ICD-10-CM

## 2022-01-27 DIAGNOSIS — J189 Pneumonia, unspecified organism: Secondary | ICD-10-CM | POA: Diagnosis not present

## 2022-01-27 NOTE — TOC Progression Note (Signed)
Transition of Care [facility]) - Progression Note    Patient Details  Name: Gabrielle [name] MRN: [id number] Date of Birth: [birth year]  Transition of Care [facility]) CM/SW Contact  Gabrielle Cha, RN Phone Number: 01/27/2022, 2:36 PM  Clinical Narrative:    Tct-daughter- message left to return call. Met with daughter Gabrielle [name] in the room.  Reviewed bed offers wants to consider either [facility] or [facility] here in [place].  Also has requested that search be expanded to the [place] area.  Expected Discharge Plan: Home/Self Care Barriers to Discharge: Continued Medical Work up  Expected Discharge Plan and Services Expected Discharge Plan: Home/Self Care   Discharge Planning Services: CM Consult   Living arrangements for the past 2 months: Single Family Home                                       Social Determinants of Health (SDOH) Interventions    Readmission Risk Interventions   No data to display

## 2022-01-27 NOTE — Progress Notes (Signed)
[facility]                                                                               Gabrielle [name], is a 88 y.o. female, DOB - 1930/06/25, VPX:[id number] Admit date - 01/21/2022    Outpatient Primary MD for the patient is Pcp, No  LOS - 5  days    Brief summary   Gabrielle [name] is an 88 yo female with PMH COPD, CVA, anxiety, osteoporosis, macular degeneration, vascular dementia.  She presented with generalized weakness, cough, and fatigue for a few days prior to admission. She had recently been on antibiotics outpatient for a UTI and respiratory infection.  Due to ongoing weakness and persistent cough, she was brought to the ER for further evaluation. See XR was obtained which was concerning for possible left lower lobe infiltrate.  She was started on Rocephin and azithromycin.  She also had some left-sided weakness and underwent further workup with CT head and MRI brain. MRI brain was notable for scattered small acute infarcts in the right ACA territory along with underlying advanced chronic small vessel disease showing mild progression since 2017 MRI. She then underwent CT angio head/neck which was negative for LVO.  Neurology was consulted due to new stroke.  She was started on aspirin and Plavix per recommendations along with statin. PT, OT, SLP were also consulted per workup.   Assessment & Plan    Assessment and Plan: * Acute CVA (cerebrovascular accident) [facility]) - Patient presented with generalized weakness but worse on the LLE - MRI brain reveals scattered small acute infarcts involving right ACA territory - Neurology has evaluated - Echo reviewed, no PFO.  EF 60 to 46%, grade 2 diastolic dysfunction, mild MV stenosis - Lower extremity duplex negative for DVT - TSH normal, 2.155 - A1c 5.8% - LDL 116.  Started on statin per neurology recommendations - asa and plavix for 21 days then plavix alone per neuro recs - heart monitor for 30 days recommended per neuro as  well  - LLE worsening today with 0/5 strength; possible that patient may require LTC if doesn't regain much function in rehab which seems like a possibility; have discussed concerns with her daughter, Gabrielle [name], as well   CAP (community acquired pneumonia)-resolved as of 01/26/2022 Completed the course of antibiotics.   Generalized weakness - see CVA  COPD, moderate (HCC) - no s/s exac - PRN duonebs         RN Pressure Injury Documentation: Pressure Injury 01/21/22 Coccyx Right;Left Stage 1 -  Intact skin with non-blanchable redness of a localized area usually over a bony prominence. (Active)  01/21/22 2030  Location: Coccyx  Location Orientation: Right;Left  Staging: Stage 1 -  Intact skin with non-blanchable redness of a localized area usually over a bony prominence.  Wound Description (Comments):   Present on Admission: Yes  Dressing Type Foam - Lift dressing to assess site every shift 01/27/22 0756    Malnutrition Type:  Nutrition Problem: Moderate Malnutrition Etiology: chronic illness (COPD)   Malnutrition Characteristics:  Signs/Symptoms: mild fat depletion, moderate muscle depletion   Nutrition Interventions:  Interventions: Ensure Enlive (each supplement provides 350kcal and 20 grams  of protein), Refer to RD note for recommendations, Liberalize Diet  Estimated body mass index is 23.17 kg/m as calculated from the following:   Height as of this encounter: '5\' 4"'$  (1.626 m).   Weight as of this encounter: 61.2 kg.  Code Status: full code.  DVT Prophylaxis:  SCDs Start: 01/21/22 2032   Level of Care: Level of care: Telemetry Family Communication: none at bedside.   Disposition Plan:     Remains inpatient appropriate:  waiting for SNF.   Procedures:  Mri BRAIN.   Consultants:   NEUROLOGY.   Antimicrobials:   Anti-infectives (From admission, onward)    Start     Dose/Rate Route Frequency Ordered Stop   01/22/22 1400  cefTRIAXone (ROCEPHIN) 2 g in  sodium chloride 0.9 % 100 mL IVPB        2 g 200 mL/hr over 30 Minutes Intravenous Every 24 hours 01/21/22 2031 01/25/22 1420   01/22/22 1400  azithromycin (ZITHROMAX) 500 mg in sodium chloride 0.9 % 250 mL IVPB        500 mg 250 mL/hr over 60 Minutes Intravenous Every 24 hours 01/21/22 2031 01/25/22 1553   01/21/22 1630  cefTRIAXone (ROCEPHIN) 1 g in sodium chloride 0.9 % 100 mL IVPB        1 g 200 mL/hr over 30 Minutes Intravenous  Once 01/21/22 1615 01/21/22 1711   01/21/22 1630  azithromycin (ZITHROMAX) 500 mg in sodium chloride 0.9 % 250 mL IVPB        500 mg 250 mL/hr over 60 Minutes Intravenous  Once 01/21/22 1615 01/21/22 1811        Medications  Scheduled Meds:  aspirin EC  81 mg Oral Daily   clopidogrel  75 mg Oral Daily   cyanocobalamin  1,000 mcg Oral Daily   donepezil  10 mg Oral Daily   feeding supplement  237 mL Oral Q24H   guaiFENesin  600 mg Oral BID   multivitamin  1 tablet Oral BID   revefenacin  175 mcg Nebulization Daily   rosuvastatin  20 mg Oral q1800   Continuous Infusions: PRN Meds:.acetaminophen **OR** acetaminophen, influenza vaccine adjuvanted, ipratropium-albuterol, ondansetron **OR** ondansetron (ZOFRAN) IV    Subjective:   Allie Ousley was seen and examined today.  No new events overnight.   Objective:   Vitals:   01/26/22 2040 01/27/22 0518 01/27/22 0838 01/27/22 1332  BP: (!) 138/57 (!) 143/54  (!) 133/53  Pulse: 67 61  64  Resp: '20 20  16  '$ Temp: 99.4 F (37.4 C) 98.6 F (37 C)  98 F (36.7 C)  TempSrc: Oral Oral  Oral  SpO2: 95% 95% 92% 96%  Weight:      Height:        Intake/Output Summary (Last 24 hours) at 01/27/2022 1456 Last data filed at 01/27/2022 0500 Gross per 24 hour  Intake --  Output 1000 ml  Net -1000 ml   Filed Weights   01/21/22 1349  Weight: 61.2 kg     Exam General: Alert and oriented x 3, NAD Cardiovascular: S1 S2 auscultated, no murmurs, RRR Respiratory: Clear to auscultation bilaterally, no  wheezing, rales or rhonchi Gastrointestinal: Soft, nontender, nondistended, + bowel sounds Ext: no pedal edema bilaterally Neuro: AAOx3, LLE weakness.  Skin: No rashes Psych: Normal affect and demeanor, alert and oriented x3    Data Reviewed:  I have personally reviewed following labs and imaging studies   CBC Lab Results  Component Value Date   WBC 8.9  01/23/2022   RBC 3.65 (L) 01/23/2022   HGB 11.6 (L) 01/23/2022   HCT 35.8 (L) 01/23/2022   MCV 98.1 01/23/2022   MCH 31.8 01/23/2022   PLT 319 01/23/2022   MCHC 32.4 01/23/2022   RDW 13.2 01/23/2022   LYMPHSABS 1.6 01/23/2022   MONOABS 0.8 01/23/2022   EOSABS 0.2 01/23/2022   BASOSABS 0.1 37/11/6267     Last metabolic panel Lab Results  Component Value Date   NA 140 01/23/2022   K 3.9 01/23/2022   CL 107 01/23/2022   CO2 25 01/23/2022   BUN 27 (H) 01/23/2022   CREATININE 0.75 01/23/2022   GLUCOSE 101 (H) 01/23/2022   GFRNONAA >60 01/23/2022   GFRAA >60 11/12/2018   CALCIUM 8.3 (L) 01/23/2022   PROT 5.6 (L) 01/22/2022   ALBUMIN 2.6 (L) 01/22/2022   BILITOT 0.4 01/22/2022   ALKPHOS 70 01/22/2022   AST 13 (L) 01/22/2022   ALT 10 01/22/2022   ANIONGAP 8 01/23/2022    CBG (last 3)  No results for input(s): "GLUCAP" in the last 72 hours.    Coagulation Profile: No results for input(s): "INR", "PROTIME" in the last 168 hours.   Radiology Studies: No results found.     Hosie Poisson M.D. [facility] 01/27/2022, 2:56 PM  Available via Epic secure chat 7am-7pm After 7 pm, please refer to night coverage provider listed on amion.

## 2022-01-27 NOTE — Progress Notes (Signed)
Physical Therapy Treatment Patient Details Name: Gabrielle [name] MRN: [id number] DOB: 29-Oct-1930 Today's Date: 01/27/2022   History of Present Illness Gabrielle [name] is an 88 yo female who presented 01/21/22 with weakness, cough and fatigue.  Pt also with L sided weakness.  Pt admitted for CAP and found to have small scattered acute infarcts in R ACA territory.  Pt with PMH COPD, CVA, anxiety, osteoporosis, macular degeneration, vascular dementia.    PT Comments    Pt AxO x 3 following all commands but mostly silent, withdrawn and flat.  Assisted OOB was difficult.  General bed mobility comments: Required assist for L LE and to lift trunk; increased time with cues to scoot forward.  Increased assist with scooting using bed pad.  Present with kyphotic siiting EOB, as well as severe LEFT lean. General transfer comment: Fist assisted from recliner to [facility] via "Bear Hug" due to urine urgency.  Pt was able to void and had a small BM.  Second, used STEDY + 2 assist with MAX Assist to support upper body due to Kyphotic LEFT lean. Pt was able to static stand with MAX Asisst but no more than 30 seconds in STEDY.  Left LE remains 0/5 strength.  Left UE limited 2/5 with poor hand grip. Positioned in recliner to comfort positioning LEFT UE and elevating L LE.  Applied worm blankets. Daughter present during session.  Pt will need ST Rehab at SNF to address mobility and functional decline prior to safely returning home.   Recommendations for follow up therapy are one component of a multi-disciplinary discharge planning process, led by the attending physician.  Recommendations may be updated based on patient status, additional functional criteria and insurance authorization.  Follow Up Recommendations  Skilled nursing-short term rehab (<3 hours/day) Can patient physically be transported by private vehicle: No   Assistance Recommended at Discharge Frequent or constant Supervision/Assistance  Patient can return home  with the following Two people to help with walking and/or transfers;Two people to help with bathing/dressing/bathroom   Equipment Recommendations  None recommended by PT    Recommendations for Other Services       Precautions / Restrictions Precautions Precautions: Fall Precaution Comments: L Hemiparesis with weakness more pronounced in the LLE than the LUE Restrictions Weight Bearing Restrictions: No     Mobility  Bed Mobility Overal bed mobility: Needs Assistance Bed Mobility: Supine to Sit     Supine to sit: Max assist, HOB elevated     General bed mobility comments: Required assist for L LE and to lift trunk; increased time with cues to scoot forward.  Increased assist with scooting using bed pad.  Present with kyphotic siiting EOB, as well as severe LEFT lean.    Transfers Overall transfer level: Needs assistance   Transfers: Bed to chair/wheelchair/BSC   Stand pivot transfers: Max assist, +2 physical assistance, Modified independent (Device/Increase time), +2 safety/equipment         General transfer comment: Fist assisted from recliner to [facility] via "Bear Hug" due to urine urgency.  Pt was able to void and had a small BM.  Second, used STEDY + 2 assist with MAX Assist to support upper body due to Kyphotic LEFT lean. Pt was ablee to static stand with MAX Asisst but no more than 30 seconds in STEDY.  Left LE remains 0/5 strength.  Left UE limited 2/5 with poor hand grip. Transfer via Lift Equipment: Stedy  Ambulation/Gait  General Gait Details: currently Non amb due to left hemiparesis LE>UE involvement.   Stairs             Wheelchair Mobility    Modified Rankin (Stroke Patients Only)       Balance                                            Cognition Arousal/Alertness: Awake/alert Behavior During Therapy: Flat affect                                   General Comments: AxO x 3 following all  commands but mostly silent, withdrawn and soft spoken        Exercises      General Comments        Pertinent Vitals/Pain Pain Assessment Pain Assessment: Faces Faces Pain Scale: Hurts a little bit Pain Location: general also c/o "feeling cold" Pain Descriptors / Indicators: Aching Pain Intervention(s): Monitored during session    Home Living                          Prior Function            PT Goals (current goals can now be found in the care plan section) Progress towards PT goals: Progressing toward goals    Frequency    Min 4X/week      PT Plan Current plan remains appropriate    Co-evaluation              AM-PAC PT "6 Clicks" Mobility   Outcome Measure  Help needed turning from your back to your side while in a flat bed without using bedrails?: A Lot Help needed moving from lying on your back to sitting on the side of a flat bed without using bedrails?: A Lot Help needed moving to and from a bed to a chair (including a wheelchair)?: A Lot Help needed standing up from a chair using your arms (e.g., wheelchair or bedside chair)?: Total Help needed to walk in hospital room?: Total   6 Click Score: 8    End of Session Equipment Utilized During Treatment: Gait belt Activity Tolerance: Patient tolerated treatment well   Nurse Communication: Mobility status;Need for lift equipment PT Visit Diagnosis: Other abnormalities of gait and mobility (R26.89);Muscle weakness (generalized) (M62.81)     Time: 0045-9977 PT Time Calculation (min) (ACUTE ONLY): 25 min  Charges:  $Therapeutic Activity: 23-37 mins                     {Janei Scheff  PTA Acute  Rehabilitation Services Office M-F          [phone] Weekend pager [phone]

## 2022-01-28 ENCOUNTER — Telehealth: Payer: Self-pay | Admitting: Internal Medicine

## 2022-01-28 DIAGNOSIS — R531 Weakness: Secondary | ICD-10-CM | POA: Diagnosis not present

## 2022-01-28 DIAGNOSIS — I639 Cerebral infarction, unspecified: Secondary | ICD-10-CM | POA: Diagnosis not present

## 2022-01-28 DIAGNOSIS — J189 Pneumonia, unspecified organism: Secondary | ICD-10-CM | POA: Diagnosis not present

## 2022-01-28 MED ORDER — ROSUVASTATIN CALCIUM 20 MG PO TABS: 20.0000 mg | ORAL_TABLET | Freq: Every day | ORAL | 2 refills | Status: AC

## 2022-01-28 MED ORDER — IPRATROPIUM-ALBUTEROL 0.5-2.5 (3) MG/3ML IN SOLN: 3.0000 mL | Freq: Four times a day (QID) | RESPIRATORY_TRACT | 2 refills | Status: AC | PRN

## 2022-01-28 MED ORDER — ASPIRIN 81 MG PO TBEC: 81.0000 mg | DELAYED_RELEASE_TABLET | Freq: Every day | ORAL | 0 refills | Status: AC

## 2022-01-28 MED ORDER — CLOPIDOGREL BISULFATE 75 MG PO TABS: 75.0000 mg | ORAL_TABLET | Freq: Every day | ORAL | 1 refills | Status: AC

## 2022-01-28 MED ORDER — ENSURE ENLIVE PO LIQD: 237.0000 mL | ORAL | 12 refills | Status: AC

## 2022-01-28 MED ORDER — GUAIFENESIN ER 600 MG PO TB12: 600.0000 mg | ORAL_TABLET | Freq: Two times a day (BID) | ORAL | Status: AC

## 2022-01-28 NOTE — TOC Progression Note (Signed)
Transition of Care [facility]) - Progression Note    Patient Details  Name: NANEA JARED MRN: [id number] Date of Birth: 1930/06/22  Transition [facility] Care [facility]) CM/SW Contact  Leeroy Cha, RN Phone Number: 01/28/2022, 11:36 AM  Clinical Narrative:    Daughter has chosen [facility].   T[facility] can accept today with Earnestine Leys obtained.   Md notified. Daughter notified.   Expected Discharge Plan: [place] Barriers to Discharge: Continued Medical Work up  Expected Discharge Plan and Services Expected Discharge Plan: [place]   Discharge Planning Services: CM Consult   Living arrangements for the past 2 months: Single Family Home                                       Social Determinants of Health (SDOH) Interventions    Readmission Risk Interventions   No data to display

## 2022-01-28 NOTE — Telephone Encounter (Signed)
To attend her mothers/the patient virtual appointment. Overall the daughter says she wants to know if  the pt needs to cont yupelri and albuterol as she gets ready to go to a rehab center.

## 2022-01-28 NOTE — Plan of Care (Signed)
Patient sleeping, no change in skin issues, transfer to bed with heavy assist.

## 2022-01-28 NOTE — Discharge Summary (Addendum)
Physician Discharge Summary   Patient: Gabrielle [name] MRN: [id number] DOB: 09-06-1930  Admit date:     01/21/2022  Discharge date:   Discharge Physician: Hosie Poisson   PCP: Pcp, No   Recommendations at discharge:  Please follow up with PCp in one week.  Please follow up with neurology in 4 to 6 weeks.   Discharge Diagnoses: Principal Problem:   Acute CVA (cerebrovascular accident) ([place]) Active Problems:   Generalized weakness   COPD, moderate (HCC)   Vascular dementia ([place])   Pressure injury of skin  Resolved Problems:   CAP (community acquired pneumonia)  Hospital Course: Gabrielle [name] is an 88 yo female with PMH COPD, CVA, anxiety, osteoporosis, macular degeneration, vascular dementia.  She presented with generalized weakness, cough, and fatigue for a few days prior to admission. She had recently been on antibiotics outpatient for a UTI and respiratory infection.  Due to ongoing weakness and persistent cough, she was brought to the ER for further evaluation. See XR was obtained which was concerning for possible left lower lobe infiltrate.  She was started on Rocephin and azithromycin.  She also had some left-sided weakness and underwent further workup with CT head and MRI brain. MRI brain was notable for scattered small acute infarcts in the right ACA territory along with underlying advanced chronic small vessel disease showing mild progression since 2017 MRI. She then underwent CT angio head/neck which was negative for LVO.  Neurology was consulted due to new stroke.  She was started on aspirin and Plavix per recommendations along with statin. PT, OT, SLP were also consulted per workup.  Assessment and Plan: * Acute CVA (cerebrovascular accident) [facility]) - Patient presented with generalized weakness but worse on the LLE - MRI brain reveals scattered small acute infarcts involving right ACA territory - Neurology has evaluated - Echo reviewed, no PFO.  EF 60 to 40%, grade 2 diastolic  dysfunction, mild MV stenosis - Lower extremity duplex negative for DVT - TSH normal, 2.155 - A1c 5.8% - LDL 116.  Started on statin per neurology recommendations - asa and plavix for 21 days then plavix alone per neuro recs - heart monitor for 30 days recommended per neuro as well    CAP (community acquired pneumonia)-resolved as of 01/26/2022 - Has been on some treatment prior to admission - Given opacity appreciated in left lower lobe, will continue on treatment course for now and monitor clinical response -Strep pneumo and Legionella urinary antigen negative -Antibiotic course completed in hospital  Generalized weakness - see CVA  COPD, moderate ([place]) - no s/s exac - PRN duonebs         Consultants: neurology.  Procedures performed: none.   Disposition: Skilled nursing facility Diet recommendation:  Discharge Diet Orders (From admission, onward)     Start     Ordered   01/28/22 0000  Diet - low sodium heart healthy        01/28/22 1137           Regular diet DISCHARGE MEDICATION: Allergies as of 01/28/2022   No Known Allergies      Medication List     STOP taking these medications    doxycycline 100 MG tablet Commonly known as: VIBRA-TABS   predniSONE 20 MG tablet Commonly known as: DELTASONE       TAKE these medications    albuterol (2.5 MG/3ML) 0.083% nebulizer solution Commonly known as: PROVENTIL Take 2.5 mg by nebulization every 4 (four) hours as needed for wheezing or  shortness of breath.   aspirin EC 81 MG tablet Take 81 mg by mouth daily. Swallow whole.   benzonatate 100 MG capsule Commonly known as: TESSALON Take by mouth 2 (two) times daily as needed for cough.   clopidogrel 75 MG tablet Commonly known as: PLAVIX Take 1 tablet (75 mg total) by mouth daily. Start taking on: January 29, 2022   cyanocobalamin 1000 MCG tablet Take 1,000 mcg by mouth daily.   donepezil 10 MG tablet Commonly known as: ARICEPT Take 1 tablet  (10 mg total) by mouth daily.   feeding supplement Liqd Take 237 mLs by mouth daily. Start taking on: January 29, 2022   guaiFENesin 600 MG 12 hr tablet Commonly known as: MUCINEX Take 1 tablet (600 mg total) by mouth 2 (two) times daily.   ipratropium-albuterol 0.5-2.5 (3) MG/3ML Soln Commonly known as: DUONEB Take 3 mLs by nebulization every 6 (six) hours as needed.   PRESERVISION AREDS 2 PO Take 1 capsule by mouth 2 (two) times daily.   rosuvastatin 20 MG tablet Commonly known as: CRESTOR Take 1 tablet (20 mg total) by mouth daily at 6 PM.   Vitamin D3 Super Strength 50 MCG (2000 UT) Caps Generic drug: Cholecalciferol Take 2,000 Units by mouth daily. What changed: Another medication with the same name was removed. Continue taking this medication, and follow the directions you see here.   Yupelri 175 MCG/3ML nebulizer solution Generic drug: revefenacin Take 3 mLs (175 mcg total) by nebulization daily.               Discharge Care Instructions  (From admission, onward)           Start     Ordered   01/28/22 0000  Discharge wound care:       Comments: Foam dressing as needed.   01/28/22 1137            Follow-up Information     Narda Amber K, DO. Go on 01/29/2022.   Specialty: Neurology Contact information: [address] [place] [id number] [phone]                Discharge Exam: Danley Danker Weights   01/21/22 1349  Weight: 61.2 kg  General exam: Appears calm and comfortable  Respiratory system: Clear to auscultation. Respiratory effort normal. Cardiovascular system: S1 & S2 heard, RRR. No JVD,  Gastrointestinal system: Abdomen is nondistended, soft and nontender.  Central nervous system: Alert and oriented.  Left lower extremity strength 0/5,  Extremities: no pedal edema.  Skin: No rashes,  Psychiatry: Mood & affect appropriate.    Condition at discharge: fair  The results of significant diagnostics from this  hospitalization (including imaging, microbiology, ancillary and laboratory) are listed below for reference.   Imaging Studies: VAS [place] LOWER EXTREMITY VENOUS (DVT)  Result Date: 01/23/2022  Lower Venous DVT Study Patient Name:  Gabrielle [name]  Date of Exam:   01/22/2022 Medical Rec #: [id number]       Accession #:    [id number] Date of Birth: 07-23-1930        Patient Gender: F Patient Age:   8 years Exam Location:  [facility] Procedure:      VAS [place] LOWER EXTREMITY VENOUS (DVT) Referring Phys: Cornelius Moras XU --------------------------------------------------------------------------------  Indications: Stroke.  Risk Factors: None identified. Limitations: Poor ultrasound/tissue interface and patient positioning, patient pain tolerance. Comparison Study: No prior studies. Performing Technologist: Oliver Hum RVT  Examination Guidelines: A complete evaluation includes B-mode  imaging, spectral Doppler, color Doppler, and power Doppler as needed of all accessible portions of each vessel. Bilateral testing is considered an integral part of a complete examination. Limited examinations for reoccurring indications may be performed as noted. The reflux portion of the exam is performed with the patient in reverse Trendelenburg.  +---------+---------------+---------+-----------+----------+--------------+ RIGHT    CompressibilityPhasicitySpontaneityPropertiesThrombus Aging +---------+---------------+---------+-----------+----------+--------------+ CFV      Full           Yes      Yes                                 +---------+---------------+---------+-----------+----------+--------------+ SFJ      Full                                                        +---------+---------------+---------+-----------+----------+--------------+ FV Prox  Full                                                        +---------+---------------+---------+-----------+----------+--------------+ FV Mid   Full                                                         +---------+---------------+---------+-----------+----------+--------------+ FV DistalFull                                                        +---------+---------------+---------+-----------+----------+--------------+ PFV      Full                                                        +---------+---------------+---------+-----------+----------+--------------+ POP      Full           Yes      Yes                                 +---------+---------------+---------+-----------+----------+--------------+ PTV      Full                                                        +---------+---------------+---------+-----------+----------+--------------+ PERO     Full                                                        +---------+---------------+---------+-----------+----------+--------------+   +---------+---------------+---------+-----------+----------+--------------+  LEFT     CompressibilityPhasicitySpontaneityPropertiesThrombus Aging +---------+---------------+---------+-----------+----------+--------------+ CFV      Full           Yes      Yes                                 +---------+---------------+---------+-----------+----------+--------------+ SFJ      Full                                                        +---------+---------------+---------+-----------+----------+--------------+ FV Prox  Full                                                        +---------+---------------+---------+-----------+----------+--------------+ FV Mid   Full                                                        +---------+---------------+---------+-----------+----------+--------------+ FV Distal               Yes      Yes                                 +---------+---------------+---------+-----------+----------+--------------+ PFV      Full                                                         +---------+---------------+---------+-----------+----------+--------------+ POP      Full           Yes      Yes                                 +---------+---------------+---------+-----------+----------+--------------+ PTV      Full                                                        +---------+---------------+---------+-----------+----------+--------------+ PERO     Full                                                        +---------+---------------+---------+-----------+----------+--------------+     Summary: RIGHT: - There is no evidence of deep vein thrombosis in the lower extremity. However, portions of this examination were limited- see technologist comments above.  - No cystic structure found in the popliteal fossa.  LEFT: - There is no evidence of deep  vein thrombosis in the lower extremity. However, portions of this examination were limited- see technologist comments above.  - No cystic structure found in the popliteal fossa.  *See table(s) above for measurements and observations. Electronically signed by Harold Barban MD on 01/23/2022 at 12:10:52 AM.    Final    ECHOCARDIOGRAM COMPLETE  Result Date: 01/22/2022    ECHOCARDIOGRAM REPORT   Patient Name:   Gabrielle [name] Date of Exam: 01/22/2022 Medical Rec #:  [id number]      Height:       64.0 in Accession #:    [id number]     Weight:       135.0 lb Date of Birth:  March 25, 1930       BSA:          1.655 m Patient Age:    35 years       BP:           137/62 mmHg Patient Gender: F              HR:           60 bpm. Exam Location:  Inpatient Procedure: 2D Echo Indications:    stroke  History:        Patient has prior history of Echocardiogram examinations, most                 recent 01/31/2015. COPD and PAD; Risk Factors:Current Smoker.  Sonographer:    Harvie Junior Referring Phys: [place]  1. Left ventricular ejection fraction, by estimation, is 60 to 65%. The left ventricle has normal function.  The left ventricle has no regional wall motion abnormalities. Left ventricular diastolic parameters are consistent with Grade II diastolic dysfunction (pseudonormalization).  2. Right ventricular systolic function is normal. The right ventricular size is normal.  3. The mitral valve is degenerative. Mild mitral valve regurgitation. Mild mitral stenosis. The mean mitral valve gradient is 5.0 mmHg with average heart rate of 57 bpm.  4. The aortic valve was not well visualized. There is moderate calcification of the aortic valve. Aortic valve regurgitation is not visualized. Aortic valve sclerosis is present, with no evidence of aortic valve stenosis. Comparison(s): Unable to view prior. Similar to prior report. FINDINGS  Left Ventricle: Left ventricular ejection fraction, by estimation, is 60 to 65%. The left ventricle has normal function. The left ventricle has no regional wall motion abnormalities. The left ventricular internal cavity size was normal in size. There is  no left ventricular hypertrophy. Left ventricular diastolic parameters are consistent with Grade II diastolic dysfunction (pseudonormalization). Right Ventricle: The right ventricular size is normal. No increase in right ventricular wall thickness. Right ventricular systolic function is normal. Left Atrium: Left atrial size was normal in size. Right Atrium: Right atrial size was normal in size. Pericardium: Trivial pericardial effusion is present. The pericardial effusion is anterior to the right ventricle. Mitral Valve: The mitral valve is degenerative in appearance. Mild mitral valve regurgitation. Mild mitral valve stenosis. MV peak gradient, 11.6 mmHg. The mean mitral valve gradient is 5.0 mmHg with average heart rate of 57 bpm. Tricuspid Valve: The tricuspid valve is grossly normal. Tricuspid valve regurgitation is not demonstrated. No evidence of tricuspid stenosis. Aortic Valve: The aortic valve was not well visualized. There is moderate  calcification of the aortic valve. Aortic valve regurgitation is not visualized. Aortic valve sclerosis is present, with no evidence of aortic valve stenosis. Aortic valve mean gradient measures 3.0 mmHg. Aortic valve peak  gradient measures 5.7 mmHg. Aortic valve area, by VTI measures 2.31 cm. Pulmonic Valve: The pulmonic valve was not well visualized. Pulmonic valve regurgitation is not visualized. No evidence of pulmonic stenosis. Aorta: The aortic root and ascending aorta are structurally normal, with no evidence of dilitation. IAS/Shunts: No atrial level shunt detected by color flow Doppler.  LEFT VENTRICLE PLAX 2D LVIDd:         4.30 cm     Diastology LVIDs:         2.90 cm     LV e' medial:    4.79 cm/s LV PW:         0.90 cm     LV E/e' medial:  28.6 LV IVS:        0.90 cm     LV e' lateral:   3.92 cm/s LVOT diam:     1.90 cm     LV E/e' lateral: 34.9 LV SV:         65 LV SV Index:   39 LVOT Area:     2.84 cm  LV Volumes (MOD) LV vol d, MOD A2C: 51.4 ml LV vol d, MOD A4C: 56.1 ml LV vol s, MOD A2C: 22.7 ml LV vol s, MOD A4C: 24.1 ml LV SV MOD A2C:     28.7 ml LV SV MOD A4C:     56.1 ml LV SV MOD BP:      30.0 ml RIGHT VENTRICLE RV Basal diam:  3.00 cm RV Mid diam:    2.60 cm TAPSE (M-mode): 1.5 cm LEFT ATRIUM           Index        RIGHT ATRIUM           Index LA diam:      3.50 cm 2.11 cm/m   RA Area:     10.50 cm LA Vol (A2C): 38.4 ml 23.20 ml/m  RA Volume:   21.70 ml  13.11 ml/m LA Vol (A4C): 32.4 ml 19.57 ml/m  AORTIC VALVE                    PULMONIC VALVE AV Area (Vmax):    2.11 cm     PV Vmax:       0.71 m/s AV Area (Vmean):   2.01 cm     PV Peak grad:  2.0 mmHg AV Area (VTI):     2.31 cm AV Vmax:           119.00 cm/s AV Vmean:          79.700 cm/s AV VTI:            0.280 m AV Peak Grad:      5.7 mmHg AV Mean Grad:      3.0 mmHg LVOT Vmax:         88.60 cm/s LVOT Vmean:        56.500 cm/s LVOT VTI:          0.228 m LVOT/AV VTI ratio: 0.81  AORTA Ao Root diam: 3.20 cm Ao Asc diam:  3.20 cm  MITRAL VALVE                  TRICUSPID VALVE MV Area (PHT): 2.09 cm       TR Peak grad:   21.0 mmHg MV Area VTI:   1.02 cm       TR Vmax:        229.00 cm/s MV Peak grad:  11.6 mmHg MV Mean grad:  5.0 mmHg       SHUNTS MV Vmax:       1.70 m/s       Systemic VTI:  0.23 m MV Vmean:      101.0 cm/s     Systemic Diam: 1.90 cm MV Decel Time: 363 msec MR Peak grad:    76.3 mmHg MR Mean grad:    5.0 mmHg MR Vmax:         436.80 cm/s MR Vmean:        97.8 cm/s MR PISA:         2.26 cm MR PISA Eff ROA: 12 mm MR PISA Radius:  0.60 cm MV E velocity: 137.00 cm/s MV A velocity: 156.00 cm/s MV E/A ratio:  0.88 Rudean Haskell MD Electronically signed by Rudean Haskell MD Signature Date/Time: 01/22/2022/10:14:35 AM    Final    CT ANGIO HEAD NECK W WO CM  Result Date: 01/22/2022 CLINICAL DATA:  Neuro deficit, acute, stroke suspected acute CVA. Generalized weakness. Small acute right ACA territory infarcts on MRI. EXAM: CT ANGIOGRAPHY HEAD AND NECK TECHNIQUE: Multidetector CT imaging of the head and neck was performed using the standard protocol during bolus administration of intravenous contrast. Multiplanar CT image reconstructions and MIPs were obtained to evaluate the vascular anatomy. Carotid stenosis measurements (when applicable) are obtained utilizing NASCET criteria, using the distal internal carotid diameter as the denominator. RADIATION DOSE REDUCTION: This exam was performed according to the departmental dose-optimization program which includes automated exposure control, adjustment of the mA and/or kV according to patient size and/or use of iterative reconstruction technique. CONTRAST:  35m OMNIPAQUE IOHEXOL 350 MG/ML SOLN COMPARISON:  Head CT 01/21/2022.  Head MRI 01/22/2022. FINDINGS: CT HEAD FINDINGS Brain: The small acute right ACA territory infarcts on MRI are not well shown by CT. No acute intracranial hemorrhage, mass, midline shift, or extra-axial fluid collection is identified. Confluent  hypodensities in the cerebral white matter bilaterally are unchanged and nonspecific but compatible with severe chronic small vessel ischemic disease. Small chronic infarcts are again noted in the thalami and cerebellum bilaterally. Generalized cerebral atrophy is mild for age. Vascular: Calcified atherosclerosis at the skull base. Skull: No fracture or suspicious osseous lesion. Sinuses/Orbits: Paranasal sinuses and mastoid air cells are clear. Unremarkable orbits. Other: None. Review of the MIP images confirms the above findings CTA NECK FINDINGS Aortic arch: Standard 3 vessel aortic arch with a moderate amount of atherosclerotic plaque. No significant stenosis of the arch vessel origins. Eccentric, predominantly soft plaque in the left subclavian artery proximal to the left vertebral artery origin results in approximately 70% stenosis. Right carotid system: Patent with a moderate amount of predominantly calcified plaque at the carotid bifurcation. No evidence of a significant stenosis or dissection. Left carotid system: Patent with a small to moderate amount of mixed calcified and soft plaque scattered throughout the common carotid artery and at the carotid bifurcation. No evidence of a significant stenosis or dissection. Vertebral arteries: The vertebral arteries are patent with the left being dominant and without evidence of a significant focal stenosis. Skeleton: Moderate cervical disc and facet degeneration. Other neck: No evidence of cervical lymphadenopathy or mass. Upper chest: Centrilobular emphysema. Review of the MIP images confirms the above findings CTA HEAD FINDINGS Anterior circulation: The internal carotid arteries are patent from skull base to carotid termini with mild atherosclerotic plaque bilaterally not resulting in a significant stenosis. ACAs and MCAs are patent without evidence of a proximal branch  occlusion or significant proximal stenosis. There is attenuation of distal right ACA branch  vessels compared to the left corresponding to the small acute infarcts on MRI. No aneurysm is identified. Posterior circulation: The intracranial vertebral arteries are patent with the left being strongly dominant. The right vertebral artery is diminutive distal to the PICA origin and does not significantly contribute to the basilar. The right PICA and left AICA appear dominant. Patent SCA origins are seen bilaterally. The basilar artery is patent and mildly small in caliber congenitally without a significant focal stenosis. There are large posterior communicating arteries with absent P1 segments bilaterally. Both PCAs are patent with mild atherosclerotic irregularity and up to mild P2 stenoses bilaterally. No aneurysm is identified. Venous sinuses: As permitted by contrast timing, patent. Anatomic variants: Strongly dominant left vertebral artery. Fetal origin of the PCAs. Review of the MIP images confirms the above findings IMPRESSION: 1. Atherosclerosis without a large vessel occlusion or flow limiting proximal intracranial arterial stenosis. 2. 70% stenosis of the left subclavian artery proximal to the vertebral artery origin. 3. Cervical carotid artery atherosclerosis without significant stenosis. 4. Aortic Atherosclerosis (ICD10-I70.0) and Emphysema (ICD10-J43.9). Electronically Signed   By: Logan Bores M.D.   On: 01/22/2022 09:42   MR BRAIN WO CONTRAST  Result Date: 01/22/2022 CLINICAL DATA:  88 year old female neurologic deficit. Increased left side weakness. EXAM: MRI HEAD WITHOUT CONTRAST TECHNIQUE: Multiplanar, multiecho pulse sequences of the brain and surrounding structures were obtained without intravenous contrast. COMPARISON:  Head CT yesterday.  Brain MRI 12/17/2015. FINDINGS: Brain: Scattered small foci of restricted diffusion in the right ACA territory, including the superior perirolandic and pre motor area (series 5, image 38), also the right cingulate gyrus, bordering the right corpus  callosum series 13, image 21. Faint T2 and FLAIR hyperintensity. No acute hemorrhage or mass effect. No other restricted diffusion. Advanced chronic bilateral cerebral white matter disease. Chronic lacunar infarcts in the bilateral thalami and brainstem. Occasional small chronic infarcts in the bilateral cerebellum. And on SWI chronic blood products in the thalami (mostly the right) appears stable. But small chronic microhemorrhages in the anterior left corona radiata and left cerebellum are new since 2017. No midline shift, mass effect, evidence of mass lesion, ventriculomegaly, extra-axial collection or acute intracranial hemorrhage. Cervicomedullary junction and pituitary are within normal limits. Vascular: Major intracranial vascular flow voids are stable since 2017. Skull and upper cervical spine: Visible cervical spine remains normal for age. Visualized bone marrow signal is within normal limits. Sinuses/Orbits: Stable, negative. Other: Mastoids remain clear. Grossly normal visible internal auditory structures. Negative visible scalp and face. IMPRESSION: 1. Scattered small acute infarcts in the Right ACA territory, including the right cingulate gyrus, pre motor and perirolandic areas. No associated hemorrhage or mass effect. 2. Underlying advanced chronic small vessel disease, with mild progression since a 2017 MRI. Electronically Signed   By: Genevie Ann M.D.   On: 01/22/2022 06:42   CT Head Wo Contrast  Result Date: 01/21/2022 CLINICAL DATA:  Neuro deficit, acute, stroke suspected EXAM: CT HEAD WITHOUT CONTRAST TECHNIQUE: Contiguous axial images were obtained from the base of the skull through the vertex without intravenous contrast. RADIATION DOSE REDUCTION: This exam was performed according to the departmental dose-optimization program which includes automated exposure control, adjustment of the mA and/or kV according to patient size and/or use of iterative reconstruction technique. COMPARISON:   09/23/2020. FINDINGS: Brain: There is periventricular white matter decreased attenuation consistent with small vessel ischemic changes. Ventricles, sulci and cisterns are prominent consistent  with age related involutional changes. No acute intracranial hemorrhage, mass effect or shift. No hydrocephalus. Basal ganglia foci of encephalomalacia consistent with old lacunar CVAs bilaterally. Vascular: No hyperdense vessel or unexpected calcification. Skull: Normal. Negative for fracture or focal lesion. Sinuses/Orbits: No acute finding. IMPRESSION: Atrophy and chronic small vessel ischemic changes. Chronic basal ganglia lacunar CVAs. No acute intracranial process identified. Electronically Signed   By: Sammie Bench M.D.   On: 01/21/2022 14:38   DG Chest 2 View  Result Date: 01/21/2022 CLINICAL DATA:  Cough EXAM: CHEST - 2 VIEW COMPARISON:  10/05/2013 and 10/18/2016 FINDINGS: Mild pulmonary vascular prominence. Normal cardiac silhouette. Calcified aorta. No pneumothorax identified. Minimal left basilar consolidation consistent with a small pneumonia or volume loss and small left-sided pleural effusion. IMPRESSION: Left-sided small effusion and left basilar minimal consolidation or volume loss. Electronically Signed   By: Sammie Bench M.D.   On: 01/21/2022 14:35    Microbiology: Results for orders placed or performed during the hospital encounter of 01/21/22  Culture, blood (routine x 2)     Status: None   Collection Time: 01/21/22  2:08 PM   Specimen: BLOOD  Result Value Ref Range Status   Specimen Description   Final    BLOOD BLOOD RIGHT FOREARM Performed at [facility] [address] [place], [place] [address]    Special Requests   Final    BOTTLES DRAWN AEROBIC AND ANAEROBIC Blood Culture adequate volume Performed at [facility] [address], [place], [place] [address]    Culture   Final    NO GROWTH 5 DAYS Performed at [facility],  [place] [address], [place], [place] [address]    Report Status 01/26/2022 FINAL  Final  Culture, blood (routine x 2)     Status: None   Collection Time: 01/21/22  2:16 PM   Specimen: BLOOD  Result Value Ref Range Status   Specimen Description   Final    BLOOD BLOOD LEFT FOREARM Performed at [facility] [address], [place], [place] [address]    Special Requests   Final    BOTTLES DRAWN AEROBIC AND ANAEROBIC Blood Culture adequate volume Performed at [facility] [address], [place], [place] [address]    Culture   Final    NO GROWTH 5 DAYS Performed at [facility], [place] [address] [place],  [address]    Report Status 01/26/2022 FINAL  Final  Resp Panel by RT-PCR (Flu A&B, Covid) Anterior Nasal Swab     Status: None   Collection Time: 01/21/22  2:25 PM   Specimen: Anterior Nasal Swab  Result Value Ref Range Status   SARS Coronavirus 2 by RT PCR NEGATIVE NEGATIVE Final    Comment: (NOTE) SARS-CoV-2 target nucleic acids are NOT DETECTED.  The SARS-CoV-2 RNA is generally detectable in upper respiratory specimens during the acute phase of infection. The lowest concentration of SARS-CoV-2 viral copies this assay can detect is 138 copies/mL. A negative result does not preclude SARS-Cov-2 infection and should not be used as the sole basis for treatment or other patient management decisions. A negative result may occur with  improper specimen collection/handling, submission of specimen other than nasopharyngeal swab, presence of viral mutation(s) within the areas targeted by this assay, and inadequate number of viral copies(<138 copies/mL). A negative result must be combined with clinical observations, patient history, and epidemiological information. The expected result is Negative.  Fact Sheet for Patients:  EntrepreneurPulse.com.au  Fact Sheet for Healthcare Providers:   IncredibleEmployment.be  This test is no t yet approved or cleared by the [place] FDA and  has been authorized for detection and/or diagnosis of SARS-CoV-2 by FDA under an Emergency Use Authorization (EUA). This EUA will remain  in effect (meaning this test can be used) for the duration of the COVID-19 declaration under Section 564(b)(1) of the Act, 21 U.S.C.section 360bbb-3(b)(1), unless the authorization is terminated  or revoked sooner.       Influenza A by PCR NEGATIVE NEGATIVE Final   Influenza B by PCR NEGATIVE NEGATIVE Final    Comment: (NOTE) The Xpert Xpress SARS-CoV-2/FLU/RSV plus assay is intended as an aid in the diagnosis of influenza from Nasopharyngeal swab specimens and should not be used as a sole basis for treatment. Nasal washings and aspirates are unacceptable for Xpert Xpress SARS-CoV-2/FLU/RSV testing.  Fact Sheet for Patients: EntrepreneurPulse.com.au  Fact Sheet for Healthcare Providers: IncredibleEmployment.be  This test is not yet approved or cleared by the [place] FDA and has been authorized for detection and/or diagnosis of SARS-CoV-2 by FDA under an Emergency Use Authorization (EUA). This EUA will remain in effect (meaning this test can be used) for the duration of the COVID-19 declaration under Section 564(b)(1) of the Act, 21 U.S.C. section 360bbb-3(b)(1), unless the authorization is terminated or revoked.  Performed at [facility], [place] [address] [place], [place] [address]   Urine Culture     Status: None   Collection Time: 01/21/22  7:58 PM   Specimen: Urine, Clean Catch  Result Value Ref Range Status   Specimen Description   Final    URINE, CLEAN CATCH Performed at [facility], [place] [address] [place], [place] [address]    Special Requests   Final    NONE Performed at [facility], [place] [address],  [place], [place] [address]    Culture   Final    NO GROWTH Performed at [facility], [place] [address], [place], [place] [address]    Report Status 01/22/2022 FINAL  Final  Respiratory (~20 pathogens) panel by PCR     Status: None   Collection Time: 01/22/22  8:04 AM   Specimen: Nasopharyngeal Swab; Respiratory  Result Value Ref Range Status   Adenovirus NOT DETECTED NOT DETECTED Final   Coronavirus 229E NOT DETECTED NOT DETECTED Final    Comment: (NOTE) The Coronavirus on the Respiratory Panel, DOES NOT test for the novel  Coronavirus (2019 nCoV)    Coronavirus HKU1 NOT DETECTED NOT DETECTED Final   Coronavirus NL63 NOT DETECTED NOT DETECTED Final   Coronavirus OC43 NOT DETECTED NOT DETECTED Final   Metapneumovirus NOT DETECTED NOT DETECTED Final   Rhinovirus / Enterovirus NOT DETECTED NOT DETECTED Final   Influenza A NOT DETECTED NOT DETECTED Final   Influenza B NOT DETECTED NOT DETECTED Final   Parainfluenza Virus 1 NOT DETECTED NOT DETECTED Final   Parainfluenza Virus 2 NOT DETECTED NOT DETECTED Final   Parainfluenza Virus 3 NOT DETECTED NOT DETECTED Final   Parainfluenza Virus 4 NOT DETECTED NOT DETECTED Final   Respiratory Syncytial Virus NOT DETECTED NOT DETECTED Final   Bordetella pertussis NOT DETECTED NOT DETECTED Final   Bordetella Parapertussis NOT DETECTED NOT DETECTED Final   Chlamydophila pneumoniae NOT DETECTED NOT DETECTED Final   Mycoplasma pneumoniae NOT DETECTED NOT DETECTED Final    Comment: Performed at [facility], [place]. [address] [place], [place] [address]  Labs: CBC: Recent Labs  Lab 01/21/22 1404 01/22/22 0325 01/23/22 0341  WBC 9.6 8.2 8.9  NEUTROABS 6.7  --  6.2  HGB 11.9* 11.1* 11.6*  HCT 36.4 33.9* 35.8*  MCV 99.2 97.7 98.1  PLT 353 336 833   Basic Metabolic Panel: Recent Labs  Lab 01/21/22 1404 01/22/22 0325 01/23/22 0341  NA 144 143 140  K 3.8 3.9 3.9  CL 109 110 107  CO2 '28 27 25  '$ GLUCOSE 97 97 101*  BUN 38* 36* 27*   CREATININE 0.98 0.70 0.75  CALCIUM 9.0 8.3* 8.3*  MG  --   --  2.0   Liver Function Tests: Recent Labs  Lab 01/22/22 0325  AST 13*  ALT 10  ALKPHOS 70  BILITOT 0.4  PROT 5.6*  ALBUMIN 2.6*   CBG: No results for input(s): "GLUCAP" in the last 168 hours.  Discharge time spent: 38 minutes.   Signed: Hosie Poisson, MD [facility] 01/28/2022

## 2022-01-29 ENCOUNTER — Ambulatory Visit: Payer: Medicare PPO | Admitting: Neurology

## 2022-01-29 NOTE — Telephone Encounter (Signed)
Called and spoke with patient's daughter. She verbalized understanding. I have updated the notes.   Nothing further needed.

## 2022-01-29 NOTE — Telephone Encounter (Signed)
Gabrielle [name] has to be scheduled [facility] Q4h prn I can do telephone call to the daughter at the time of that appt - this means do not take the appt off the schedule

## 2022-01-29 NOTE — Telephone Encounter (Signed)
Called and spoke with patient's daughter [name]. She stated that the patient will not be able to make the appt that is scheduled for 12/5. Patient had a stroke on Thanksgiving and she is now living at [facility]. She wanted to keep MR updated. She had wanted to do a virtual visit but she lives in [place] and the facility would have to help the patient with the call. [name] wanted to be apart of the visit as well. She wanted to know if there was another option such as a televisit.   [name] also stated that she was concerned that the facility was confused about her nebulizer treatments. They had the Yupelri listed as PRN and the albuterol as a scheduled medication. Tracy left the facility this afternoon but was too confident they had everything sorted out. I advised her that I would call the facility to see if I could speak with the nurse.   I called [facility] and was transferred over to the patient's nurse. She did not answer but I did left a message for her to call back and ask for anyone in triage.   Will route to MR as a FYI.

## 2022-02-01 DIAGNOSIS — J449 Chronic obstructive pulmonary disease, unspecified: Secondary | ICD-10-CM | POA: Diagnosis not present

## 2022-02-02 ENCOUNTER — Ambulatory Visit (INDEPENDENT_AMBULATORY_CARE_PROVIDER_SITE_OTHER): Payer: Medicare HMO | Admitting: Internal Medicine

## 2022-02-02 DIAGNOSIS — R54 Age-related physical debility: Secondary | ICD-10-CM | POA: Diagnosis not present

## 2022-02-02 DIAGNOSIS — J449 Chronic obstructive pulmonary disease, unspecified: Secondary | ICD-10-CM

## 2022-02-02 NOTE — Progress Notes (Signed)
OV 02/02/2022  Subjective:  Patient ID: Gabrielle [name], female , DOB: 07/18/1930 , age 88 y.o. , MRN: [id number] , ADDRESS: [place] [address] PCP Pcp, No Patient Care Team: Pcp, No as PCP - General Excell Seltzer, MD (Inactive) as Consulting Physician (General Surgery) Magrinat, Virgie Dad, MD (Inactive) as Consulting Physician (Oncology) Arloa Koh, MD (Inactive) as Consulting Physician (Radiation Oncology) Jarome Matin, MD as Consulting Physician (Dermatology) Brand Males, MD as Consulting Physician (Pulmonary Disease) Alda Berthold, DO as Consulting Physician (Neurology) Armbruster, Carlota Raspberry, MD as Consulting Physician (Gastroenterology) Bjorn Loser, MD as Consulting Physician (Urology)  This Provider for this visit: Treatment Team:  Attending Provider: Brand Males, MD    02/02/2022 - copd follwuo  This encounter for Gabrielle [name] [birth year] date of birth and MRN [id number] on this 02/02/2022 is taking place via Telephone. Patient is at [facility]. I the provider, Gabrielle [name] is at [facility].  The following daughter Ms Gabrielle [name] was n this call and relationship to patient was daughter. Patient was  NOT ON call due to inability to participate. Risks benefits, and limitations of telephone visit explained and participant agreed to proceed.      HPI Gabrielle [name] 88 y.o. -patient was in hospital with stroke 11/23 - 01/28/22. Currently at [facility] rehab . Has paresis of LLE. Says Maretta Bees was not administed in hospital. Last week Friday daugther took yupelri to [facility]. DC summary has her on yupelri scheduled and duopneb as needed. She is bed ridden ECOG 4.  Question:f rom daughter -  should she be on Yupelri ? Answered in affirmative  Her alpha 1 MM  Unclear if ONO Was done. Now that patient in SNF - daughter is oopen to getting it ordered/repeated    PFT      No data to display             has a past medical history of Anxiety,  Breast cancer ([place]) (2015), Breast cancer of upper-outer quadrant of right female breast ([place]) (10/2013), Cataract, COPD (chronic obstructive pulmonary disease) ([place]), Lung nodule, solitary (11/21/2013), Macular degeneration, Osteoporosis (10/2014), Pneumonia, Retina hole, Skin cancer, Stroke ([place]), and Tobacco dependence.   reports that she has been smoking cigarettes. She has a 60.00 pack-year smoking history. She has never used smokeless tobacco.  Past Surgical History:  Procedure Laterality Date   ABDOMINAL HYSTERECTOMY  1980   (ovaries intact)   [place]   Patient reports approximately 30 years ago having "bladder surgery "in which she describes a procedure that either stretched her bladder or her urethra dilated secondary to her not being able to void.   BREAST BIOPSY Right    CATARACT EXTRACTION Bilateral    MASTECTOMY Right 2015   MOHS SURGERY     Dr. Sarajane Jews   PARS PLANA VITRECTOMY W/ REPAIR OF MACULAR HOLE Right    RETINAL LASER PROCEDURE     TOTAL MASTECTOMY Right 12/18/2013   Procedure: RIGHT TOTAL MASTECTOMY;  Surgeon: Excell Seltzer, MD;  Location: [facility] ORS;  Service: General;  Laterality: Right;    No Known Allergies  Immunization History  Administered Date(s) Administered   Fluad Quad(high Dose 65+) 12/01/2018, 02/20/2020, 01/05/2021   Influenza, High Dose Seasonal PF 11/29/2016, 12/27/2017   Influenza,inj,Quad PF,6+ Mos 11/21/2013, 12/19/2014   PFIZER(Purple Top)SARS-COV-2 Vaccination 04/07/2019, 04/28/2019, 12/06/2019   PPD Test 09/20/2016   Pneumococcal Conjugate-13 11/21/2013   Pneumococcal Polysaccharide-23 11/29/2016  Td 12/21/2016   Zoster Recombinat (Shingrix) 03/18/2017, 06/22/2017    Family History  Problem Relation Age of Onset   Heart attack Mother        Deceased   Cancer Mother        fallopian tube cancer in 68s; deceased 55   Throat cancer Father        Deceased 65; smoker   Diabetes Brother    Prostate  cancer Brother 78       Currently 27   Other Brother        Deceased, hemorhhage of pancreas   Leukemia Maternal Aunt    Breast cancer Paternal Aunt        age at diagnosis unknown   Throat cancer Paternal Uncle        Deceased 49s; smoker   Leukemia Paternal Grandmother      Current Outpatient Medications:    albuterol (PROVENTIL) (2.5 MG/3ML) 0.083% nebulizer solution, Take 2.5 mg by nebulization every 4 (four) hours as needed for wheezing or shortness of breath., Disp: 75 mL, Rfl: 12   aspirin EC 81 MG tablet, Take 1 tablet (81 mg total) by mouth daily for 21 days. Swallow whole., Disp: 21 tablet, Rfl: 0   benzonatate (TESSALON) 100 MG capsule, Take by mouth 2 (two) times daily as needed for cough., Disp: , Rfl:    Cholecalciferol (VITAMIN D3 SUPER STRENGTH) 50 MCG (2000 UT) CAPS, Take 2,000 Units by mouth daily., Disp: , Rfl:    clopidogrel (PLAVIX) 75 MG tablet, Take 1 tablet (75 mg total) by mouth daily., Disp: 30 tablet, Rfl: 1   cyanocobalamin 1000 MCG tablet, Take 1,000 mcg by mouth daily., Disp: , Rfl:    donepezil (ARICEPT) 10 MG tablet, Take 1 tablet (10 mg total) by mouth daily., Disp: 90 tablet, Rfl: 0   feeding supplement (ENSURE ENLIVE / ENSURE PLUS) LIQD, Take 237 mLs by mouth daily., Disp: 237 mL, Rfl: 12   guaiFENesin (MUCINEX) 600 MG 12 hr tablet, Take 1 tablet (600 mg total) by mouth 2 (two) times daily., Disp: , Rfl:    ipratropium-albuterol (DUONEB) 0.5-2.5 (3) MG/3ML SOLN, Take 3 mLs by nebulization every 6 (six) hours as needed., Disp: 360 mL, Rfl: 2   Multiple Vitamins-Minerals (PRESERVISION AREDS 2 PO), Take 1 capsule by mouth 2 (two) times daily., Disp: , Rfl:    revefenacin (YUPELRI) 175 MCG/3ML nebulizer solution, Take 3 mLs (175 mcg total) by nebulization daily., Disp: 21 mL, Rfl: 0   rosuvastatin (CRESTOR) 20 MG tablet, Take 1 tablet (20 mg total) by mouth daily at 6 PM., Disp: 30 tablet, Rfl: 2      Objective:   There were no vitals filed for this  visit.  Estimated body mass index is 23.17 kg/m as calculated from the following:   Height as of 01/21/22: '5\' 4"'$  (1.626 m).   Weight as of 01/21/22: 135 lb (61.2 kg).  '@WEIGHTCHANGE'$ @  There were no vitals filed for this visit.   Physical Exam  No patient on phone     Assessment:       ICD-10-CM   1. COPD, moderate ([place])  J44.9     2. Frailty  R54          Plan:     Patient Instructions     ICD-10-CM   1. COPD, moderate ([place])  J44.9     2. Frailty  R54      Continue yupelri shceeduled + duoneb q6h as needed Test ONO  on room air at [facility]  - prn - daughter to call when needed   (Telephone visit - Level 02 visit: Estb 11-20 for this visit type which was visit type: telephone visit in total care time and counseling or/and coordination of care by this undersigned MD - Dr Brand Males. This includes one or more of the following for care delivered on 02/02/2022 same day: pre-charting, chart review, note writing, documentation discussion of test results, diagnostic or treatment recommendations, prognosis, risks and benefits of management options, instructions, education, compliance or risk-factor reduction. It excludes time spent by the [place] or office staff in the care of the patient. Actual time was 10 min. E&M code is [phone])   SIGNATURE    Dr. Brand Males, M.D., F.C.C.P,  Pulmonary and Critical Care Medicine Staff Physician, [place] Director - Interstitial Lung Disease  Program  P[facility] [place] [facility] [place], [place], [address]  Pager: [phone], If no answer or between  15:00h - 7:00h: call [phone] Telephone: [phone]  1:38 PM 02/02/2022

## 2022-02-02 NOTE — Patient Instructions (Addendum)
ICD-10-CM   1. COPD, moderate ([place])  J44.9     2. Frailty  R54      Continue yupelri shceeduled + duoneb q6h as needed Test ONO on room air at [facility]  - prn - daughter to call when needed

## 2022-02-02 NOTE — Addendum Note (Signed)
Addended by: Lorretta Harp on: 02/02/2022 02:05 PM   Modules accepted: Orders

## 2022-02-04 DIAGNOSIS — J449 Chronic obstructive pulmonary disease, unspecified: Secondary | ICD-10-CM | POA: Diagnosis not present

## 2022-02-04 DIAGNOSIS — R531 Weakness: Secondary | ICD-10-CM | POA: Diagnosis not present

## 2022-02-04 DIAGNOSIS — G08 Intracranial and intraspinal phlebitis and thrombophlebitis: Secondary | ICD-10-CM | POA: Diagnosis not present

## 2022-02-04 DIAGNOSIS — F0153 Vascular dementia, unspecified severity, with mood disturbance: Secondary | ICD-10-CM | POA: Diagnosis not present

## 2022-02-11 DIAGNOSIS — L539 Erythematous condition, unspecified: Secondary | ICD-10-CM | POA: Diagnosis not present

## 2022-02-11 DIAGNOSIS — S90812A Abrasion, left foot, initial encounter: Secondary | ICD-10-CM | POA: Diagnosis not present

## 2022-02-16 ENCOUNTER — Telehealth: Payer: Self-pay | Admitting: Internal Medicine

## 2022-02-24 DIAGNOSIS — F330 Major depressive disorder, recurrent, mild: Secondary | ICD-10-CM | POA: Diagnosis not present

## 2022-02-24 MED ORDER — ALBUTEROL SULFATE (2.5 MG/3ML) 0.083% IN NEBU: 2.5000 mg | INHALATION_SOLUTION | Freq: Four times a day (QID) | RESPIRATORY_TRACT | 11 refills | Status: AC | PRN

## 2022-02-24 MED ORDER — YUPELRI 175 MCG/3ML IN SOLN: 175.0000 ug | Freq: Every day | RESPIRATORY_TRACT | 11 refills | Status: AC

## 2022-02-24 NOTE — Telephone Encounter (Signed)
Pt's yupelri and albuterol neb sol have been sent to [facility] with the correct amount. Nothing further needed.

## 2022-03-04 DIAGNOSIS — R2689 Other abnormalities of gait and mobility: Secondary | ICD-10-CM | POA: Diagnosis not present

## 2022-03-04 DIAGNOSIS — M6281 Muscle weakness (generalized): Secondary | ICD-10-CM

## 2022-04-13 ENCOUNTER — Ambulatory Visit: Payer: Medicare HMO | Admitting: Neurology
